# Patient Record
Sex: Female | Born: 1950
Health system: Southern US, Community
[De-identification: ages and names within clinical notes are randomized; demographics above are authoritative.]

## PROBLEM LIST (undated history)

## (undated) DIAGNOSIS — Z720 Tobacco use: Secondary | ICD-10-CM

## (undated) DIAGNOSIS — A159 Respiratory tuberculosis unspecified: Secondary | ICD-10-CM

## (undated) DIAGNOSIS — H269 Unspecified cataract: Secondary | ICD-10-CM

## (undated) DIAGNOSIS — E0590 Thyrotoxicosis, unspecified without thyrotoxic crisis or storm: Secondary | ICD-10-CM

## (undated) DIAGNOSIS — H3320 Serous retinal detachment, unspecified eye: Secondary | ICD-10-CM

## (undated) DIAGNOSIS — R002 Palpitations: Secondary | ICD-10-CM

## (undated) DIAGNOSIS — I10 Essential (primary) hypertension: Secondary | ICD-10-CM

## (undated) DIAGNOSIS — C539 Malignant neoplasm of cervix uteri, unspecified: Secondary | ICD-10-CM

## (undated) DIAGNOSIS — T7840XA Allergy, unspecified, initial encounter: Secondary | ICD-10-CM

## (undated) DIAGNOSIS — H409 Unspecified glaucoma: Secondary | ICD-10-CM

## (undated) DIAGNOSIS — Z9289 Personal history of other medical treatment: Secondary | ICD-10-CM

## (undated) DIAGNOSIS — M1990 Unspecified osteoarthritis, unspecified site: Secondary | ICD-10-CM

## (undated) DIAGNOSIS — R112 Nausea with vomiting, unspecified: Secondary | ICD-10-CM

## (undated) DIAGNOSIS — R32 Unspecified urinary incontinence: Secondary | ICD-10-CM

## (undated) DIAGNOSIS — K219 Gastro-esophageal reflux disease without esophagitis: Secondary | ICD-10-CM

## (undated) DIAGNOSIS — Z9889 Other specified postprocedural states: Secondary | ICD-10-CM

## (undated) HISTORY — DX: Thyrotoxicosis, unspecified without thyrotoxic crisis or storm: E05.90

## (undated) HISTORY — DX: Malignant neoplasm of cervix uteri, unspecified: C53.9

## (undated) HISTORY — DX: Allergy, unspecified, initial encounter: T78.40XA

## (undated) HISTORY — PX: TOTAL ABDOMINAL HYSTERECTOMY W/ BILATERAL SALPINGOOPHORECTOMY: SHX83

## (undated) HISTORY — DX: Personal history of other medical treatment: Z92.89

## (undated) HISTORY — PX: EYE SURGERY: SHX253

## (undated) HISTORY — PX: OTHER SURGICAL HISTORY: SHX169

## (undated) HISTORY — PX: REPAIR OF COMPLEX TRACTION RETINAL DETACHMENT: SHX6217

## (undated) HISTORY — DX: Unspecified urinary incontinence: R32

## (undated) HISTORY — DX: Tobacco use: Z72.0

## (undated) HISTORY — PX: ABDOMINAL HYSTERECTOMY: SHX81

## (undated) HISTORY — DX: Unspecified glaucoma: H40.9

---

## 1982-04-03 HISTORY — PX: ABDOMINAL HYSTERECTOMY: SHX81

## 1998-03-29 ENCOUNTER — Ambulatory Visit (HOSPITAL_BASED_OUTPATIENT_CLINIC_OR_DEPARTMENT_OTHER): Admission: RE | Admit: 1998-03-29 | Discharge: 1998-03-29 | Payer: Self-pay | Admitting: Ophthalmology

## 1998-10-06 ENCOUNTER — Encounter: Payer: Self-pay | Admitting: Ophthalmology

## 1998-10-06 ENCOUNTER — Ambulatory Visit (HOSPITAL_COMMUNITY): Admission: RE | Admit: 1998-10-06 | Discharge: 1998-10-07 | Payer: Self-pay | Admitting: Ophthalmology

## 1998-11-26 ENCOUNTER — Other Ambulatory Visit: Admission: RE | Admit: 1998-11-26 | Discharge: 1998-11-26 | Payer: Self-pay | Admitting: *Deleted

## 2000-09-28 ENCOUNTER — Other Ambulatory Visit: Admission: RE | Admit: 2000-09-28 | Discharge: 2000-09-28 | Payer: Self-pay | Admitting: Family Medicine

## 2001-10-07 ENCOUNTER — Other Ambulatory Visit: Admission: RE | Admit: 2001-10-07 | Discharge: 2001-10-07 | Payer: Self-pay | Admitting: Family Medicine

## 2004-01-27 ENCOUNTER — Other Ambulatory Visit: Admission: RE | Admit: 2004-01-27 | Discharge: 2004-01-27 | Payer: Self-pay | Admitting: Family Medicine

## 2004-02-19 ENCOUNTER — Other Ambulatory Visit: Admission: RE | Admit: 2004-02-19 | Discharge: 2004-02-19 | Payer: Self-pay | Admitting: Diagnostic Radiology

## 2005-08-25 ENCOUNTER — Encounter: Admission: RE | Admit: 2005-08-25 | Discharge: 2005-08-25 | Payer: Self-pay | Admitting: Family Medicine

## 2006-08-03 ENCOUNTER — Emergency Department (HOSPITAL_COMMUNITY): Admission: EM | Admit: 2006-08-03 | Discharge: 2006-08-03 | Payer: Self-pay | Admitting: *Deleted

## 2009-07-14 ENCOUNTER — Emergency Department (HOSPITAL_COMMUNITY): Admission: EM | Admit: 2009-07-14 | Discharge: 2009-07-14 | Payer: Self-pay | Admitting: Emergency Medicine

## 2010-04-23 ENCOUNTER — Encounter: Payer: Self-pay | Admitting: Family Medicine

## 2013-03-08 ENCOUNTER — Emergency Department (HOSPITAL_BASED_OUTPATIENT_CLINIC_OR_DEPARTMENT_OTHER): Payer: Self-pay

## 2013-03-08 ENCOUNTER — Encounter (HOSPITAL_BASED_OUTPATIENT_CLINIC_OR_DEPARTMENT_OTHER): Payer: Self-pay | Admitting: Emergency Medicine

## 2013-03-08 ENCOUNTER — Emergency Department (HOSPITAL_BASED_OUTPATIENT_CLINIC_OR_DEPARTMENT_OTHER)
Admission: EM | Admit: 2013-03-08 | Discharge: 2013-03-08 | Disposition: A | Discharge status: Home / Self Care | Payer: Self-pay | Attending: Emergency Medicine | Admitting: Emergency Medicine

## 2013-03-08 DIAGNOSIS — F17200 Nicotine dependence, unspecified, uncomplicated: Secondary | ICD-10-CM | POA: Insufficient documentation

## 2013-03-08 DIAGNOSIS — G459 Transient cerebral ischemic attack, unspecified: Secondary | ICD-10-CM | POA: Insufficient documentation

## 2013-03-08 DIAGNOSIS — I10 Essential (primary) hypertension: Secondary | ICD-10-CM | POA: Insufficient documentation

## 2013-03-08 DIAGNOSIS — Z8669 Personal history of other diseases of the nervous system and sense organs: Secondary | ICD-10-CM | POA: Insufficient documentation

## 2013-03-08 HISTORY — DX: Essential (primary) hypertension: I10

## 2013-03-08 HISTORY — DX: Serous retinal detachment, unspecified eye: H33.20

## 2013-03-08 LAB — BASIC METABOLIC PANEL
BUN: 23 mg/dL (ref 6–23)
CO2: 26 mEq/L (ref 19–32)
Chloride: 102 mEq/L (ref 96–112)
GFR calc non Af Amer: 42 mL/min — ABNORMAL LOW (ref >90)
Glucose, Bld: 104 mg/dL — ABNORMAL HIGH (ref 70–99)
Sodium: 139 mEq/L (ref 135–145)

## 2013-03-08 LAB — TROPONIN I: Troponin I: <0.3 ng/mL (ref <0.30)

## 2013-03-08 MED ORDER — ASPIRIN 81 MG PO CHEW
324.0000 mg | CHEWABLE_TABLET | Freq: Once | ORAL | Status: AC
  Administered 2013-03-08: 324 mg via ORAL
  Filled 2013-03-08: qty 4

## 2013-03-08 MED ORDER — METOPROLOL TARTRATE 100 MG PO TABS: 50.0000 mg | ORAL_TABLET | Freq: Two times a day (BID) | ORAL | Status: DC

## 2013-03-08 MED ORDER — LISINOPRIL 10 MG PO TABS
20.0000 mg | ORAL_TABLET | Freq: Once | ORAL | Status: AC
  Administered 2013-03-08: 20 mg via ORAL
  Filled 2013-03-08: qty 2

## 2013-03-08 MED ORDER — LISINOPRIL-HYDROCHLOROTHIAZIDE 20-12.5 MG PO TABS: 1.0000 | ORAL_TABLET | Freq: Every day | ORAL | Status: DC

## 2013-03-08 NOTE — ED Notes (Signed)
Pt having hypertension due to running out of medication.  Pt has not been on routine meds for over two months.  Pt stated her left hand felt funny last night, radiating to jaw.  Some speech slurring last night according to daughter.  Symptoms resolved today.

## 2013-03-08 NOTE — ED Provider Notes (Signed)
CSN: [id number]     Arrival date & time 03/08/13  1446 History   First MD Initiated Contact with Patient 03/08/13 1516     Chief Complaint  Patient presents with  . Hypertension    HPI  Patient presents here with her daughter. 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 While she was sitting with the patient last night she had an episode of left arm tingling. She may have some slurring of her speech. She is uncertain about this prior symptoms resolved within an hour. She drove home. She slapped. She waking today asymptomatic. She told her daughter about it. Her daughter presents her here. Patient has a history of hypertension and continues to smoke. Has been off her blood pressure medicine for several months. Never had stroke symptoms. Never had cardiac symptoms. No chest pain or palpitations with the episode yesterday no symptoms today. No difficulty with speech, cognition, numbness weakness. Or other symptoms. Past Medical History  Diagnosis Date  . Hypertension   . Retinal detachment    Past Surgical History  Procedure Laterality Date  . Repair of complex traction retinal detachment     History reviewed. No pertinent family history. History  Substance Use Topics  . Smoking status: Current Every Day Smoker  . Smokeless tobacco: Not on file  . Alcohol Use: Not on file   OB History   Grav Para Term Preterm Abortions TAB SAB Ect Mult Living                 Review of Systems  Constitutional: Negative for fever, chills, diaphoresis, appetite change and fatigue.  HENT: Negative for mouth sores, sore throat and trouble swallowing.   Eyes: Negative for visual disturbance.  Respiratory: Negative for cough, chest tightness, shortness of breath and wheezing.   Cardiovascular: Negative for chest pain and palpitations.  Gastrointestinal: Negative for nausea, vomiting, abdominal pain, diarrhea and abdominal distention.  Endocrine: Negative for polydipsia, polyphagia and polyuria.   Genitourinary: Negative for dysuria, frequency and hematuria.  Musculoskeletal: Negative for gait problem.  Skin: Negative for color change, pallor and rash.  Neurological: Positive for speech difficulty. Negative for dizziness, syncope, weakness, light-headedness and headaches.       Angling to the left arm without weakness or pain  Hematological: Does not bruise/bleed easily.  Psychiatric/Behavioral: Negative for behavioral problems and confusion.    Allergies  Codeine  Home Medications    BP 183/96  Pulse 78  Temp(Src) 97.6 F (36.4 C) (Oral)  Resp 16  Ht 5\' 7"  (1.702 m)  Wt 191 lb 1 oz (86.665 kg)  BMI 29.92 kg/m2  SpO2 97% Physical Exam  Constitutional: She is oriented to person, place, and time. She appears well-developed and well-nourished. No distress.  HENT:  Head: Normocephalic.  Eyes: Conjunctivae are normal. Pupils are equal, round, and reactive to light. No scleral icterus.  Neck: Normal range of motion. Neck supple. No thyromegaly present.  No carotid bruits  Cardiovascular: Normal rate, regular rhythm and normal heart sounds.  Exam reveals no gallop and no friction rub.   No murmur heard. Normal heart tones. Sinus rhythm on the monitor. No ectopy.  Pulmonary/Chest: Effort normal and breath sounds normal. No respiratory distress. She has no wheezes. She has no rales.  Abdominal: Soft. Bowel sounds are normal. She exhibits no distension. There is no tenderness. There is no rebound.  Musculoskeletal: Normal range of motion.  Neurological: She is alert and oriented to person, place, and time.  Speech.  Normal upper and lower extremity exam without weakness or pronator drift. Normal gait. Normal tandem gait. Normal cranial nerves without facial droop.  Skin: Skin is warm and dry. No rash noted.  Psychiatric: She has a normal mood and affect. Her behavior is normal.    ED Course  Procedures (including critical care time) Labs Review Labs Reviewed  BASIC  METABOLIC PANEL - Abnormal; Notable for the following:    Glucose, Bld 104 (*)    Creatinine, Ser 1.30 (*)    GFR calc non Af Amer 42 (*)    GFR calc Af Amer 49 (*)    All other components within normal limits  TROPONIN I   Imaging Review Ct Head Wo Contrast  03/08/2013   CLINICAL DATA:  Headache and dizziness.  Left arm and face numbness.  EXAM: CT HEAD WITHOUT CONTRAST  TECHNIQUE: Contiguous axial images were obtained from the base of the skull through the vertex without intravenous contrast.  COMPARISON:  None.  FINDINGS: There is marked patchy low attenuation throughout the subcortical and periventricular white matter consistent with small vessel ischemic disease.  Prominence of the sulci and ventricles are identified consistent with brain atrophy.  No acute cortical infarct, hemorrhage, or mass lesion ispresent. Ventricles are of normal size. No significant extra-axial fluid collection is present. The paranasal sinuses andmastoid air cells are clear. The osseous skull is intact.  IMPRESSION: Marked chronic small vessel ischemic change. Could extensive small vessel ischemic change a diminishes sensitivity for detecting subtle acute infarct.  No acute intracranial abnormalities noted.   Electronically Signed   By: Signa Kell M.D.   On: 03/08/2013 16:42    EKG Interpretation    Date/Time:  Saturday March 08 2013 15:35:56 EST Ventricular Rate:  66 PR Interval:  170 QRS Duration: 102 QT Interval:  418 QTC Calculation: 438 R Axis:   -3 Text Interpretation:  Normal sinus rhythm Left ventricular hypertrophy Abnormal ECG Confirmed by Fayrene Fearing  MD, Merel Santoli ([id number]) on 03/08/2013 4:47:22 PM            MDM   1. Hypertension   2. TIA (transient ischemic attack)    She is a mild elevation of her creatinine at 1.3. She has chronic ischemic white matter changes on her CT but no acute infarct. She is asymptomatic here. She has LVH on her EKG. Usual symptoms of chronic poorly controlled high  blood pressure and tobacco abuse. I have told her in no uncertain terms to stop smoking. Astra take 81 mg aspirin daily. Refilled her medications. Astra have her creatinine rechecked within a month during that she is on an ACE inhibitor. Discuss inpatient versus outpatient treatment she has an absolute desire to not be transported to inpatient care.    Roney Marion, MD 03/08/13 [phone]

## 2014-02-23 ENCOUNTER — Other Ambulatory Visit: Payer: Self-pay | Admitting: Family Medicine

## 2014-03-19 ENCOUNTER — Other Ambulatory Visit: Payer: Self-pay | Admitting: Endocrinology

## 2014-03-19 DIAGNOSIS — E0590 Thyrotoxicosis, unspecified without thyrotoxic crisis or storm: Secondary | ICD-10-CM

## 2014-03-19 DIAGNOSIS — E0789 Other specified disorders of thyroid: Secondary | ICD-10-CM

## 2014-03-19 DIAGNOSIS — E049 Nontoxic goiter, unspecified: Secondary | ICD-10-CM

## 2014-03-26 ENCOUNTER — Ambulatory Visit
Admission: RE | Admit: 2014-03-26 | Discharge: 2014-03-26 | Disposition: A | Discharge status: Home / Self Care | Payer: Self-pay | Source: Ambulatory Visit | Attending: Endocrinology | Admitting: Endocrinology

## 2014-03-26 DIAGNOSIS — E0590 Thyrotoxicosis, unspecified without thyrotoxic crisis or storm: Secondary | ICD-10-CM

## 2014-03-26 DIAGNOSIS — E0789 Other specified disorders of thyroid: Secondary | ICD-10-CM

## 2014-05-04 ENCOUNTER — Other Ambulatory Visit: Payer: Self-pay | Admitting: Endocrinology

## 2014-05-04 DIAGNOSIS — E0520 Thyrotoxicosis with toxic multinodular goiter without thyrotoxic crisis or storm: Secondary | ICD-10-CM

## 2014-05-18 ENCOUNTER — Encounter (HOSPITAL_COMMUNITY)
Admission: RE | Admit: 2014-05-18 | Discharge: 2014-05-18 | Disposition: A | Discharge status: Home / Self Care | Payer: Medicare HMO | Source: Ambulatory Visit | Attending: Endocrinology | Admitting: Endocrinology

## 2014-05-18 DIAGNOSIS — E0520 Thyrotoxicosis with toxic multinodular goiter without thyrotoxic crisis or storm: Secondary | ICD-10-CM | POA: Insufficient documentation

## 2014-05-19 ENCOUNTER — Encounter (HOSPITAL_COMMUNITY)
Admission: RE | Admit: 2014-05-19 | Discharge: 2014-05-19 | Disposition: A | Discharge status: Home / Self Care | Payer: Medicare HMO | Source: Ambulatory Visit | Attending: Endocrinology | Admitting: Endocrinology

## 2014-05-19 DIAGNOSIS — E0520 Thyrotoxicosis with toxic multinodular goiter without thyrotoxic crisis or storm: Secondary | ICD-10-CM | POA: Diagnosis present

## 2014-05-19 MED ORDER — SODIUM IODIDE I 131 CAPSULE
16.0000 | Freq: Once | INTRAVENOUS | Status: AC | PRN
Start: 2014-05-18 — End: 2014-05-18

## 2014-05-19 MED ORDER — SODIUM PERTECHNETATE TC 99M INJECTION
10.0000 | Freq: Once | INTRAVENOUS | Status: AC | PRN
  Administered 2014-05-19: 10 via INTRAVENOUS

## 2014-06-08 ENCOUNTER — Other Ambulatory Visit: Payer: Self-pay | Admitting: Endocrinology

## 2014-06-08 DIAGNOSIS — E041 Nontoxic single thyroid nodule: Secondary | ICD-10-CM

## 2014-06-09 ENCOUNTER — Other Ambulatory Visit: Payer: Self-pay | Admitting: Endocrinology

## 2014-06-09 DIAGNOSIS — E041 Nontoxic single thyroid nodule: Secondary | ICD-10-CM

## 2014-06-16 ENCOUNTER — Ambulatory Visit
Admission: RE | Admit: 2014-06-16 | Discharge: 2014-06-16 | Disposition: A | Discharge status: Home / Self Care | Payer: Medicare HMO | Source: Ambulatory Visit | Attending: Endocrinology | Admitting: Endocrinology

## 2014-06-16 ENCOUNTER — Other Ambulatory Visit (HOSPITAL_COMMUNITY)
Admission: RE | Admit: 2014-06-16 | Discharge: 2014-06-16 | Disposition: A | Discharge status: Home / Self Care | Payer: Medicare HMO | Source: Ambulatory Visit | Attending: Interventional Radiology | Admitting: Interventional Radiology

## 2014-06-16 DIAGNOSIS — E041 Nontoxic single thyroid nodule: Secondary | ICD-10-CM | POA: Diagnosis present

## 2014-11-26 ENCOUNTER — Telehealth: Payer: Self-pay | Admitting: *Deleted

## 2014-11-26 NOTE — Telephone Encounter (Signed)
Needs to speak a triage nurse

## 2014-11-26 NOTE — Telephone Encounter (Signed)
Dr. Kristie Cowman from [facility] contacted [facility] to discuss patient that he has in his care.  Patient who has history of PVC's, Hyperthyroidism and non compliance with medications and plan of care presents with EKG that shows SR with bigeminal PVC's without symptoms.  Dr. Ronnald Ramp would like to know if the patient needs cardiology consult.  Discussed with Dr. Martinique  Made appointment with next available, Tuesday, 8/30 with Richardson Dopp at [facility]  Dr. Ronnald Ramp given the information for follow up and will give the information to the patient  Will be faxing all notes, EKG to [phone]

## 2014-11-30 NOTE — Progress Notes (Addendum)
Cardiology Office Note   Date:  12/01/2014   ID:  Kari Coleman, Kari Coleman 1950-08-28, MRN [id number]  PCP:  Dorena Dew, FNP  Cardiologist:  New - Dr. Casandra Doffing   Electrophysiologist:  n/a  Chief Complaint  Patient presents with  . Irregular Heart Beat    PVCs     History of Present Illness: Kari Coleman is a 64 y.o. female with a hx of persistent hyperthyroidism despite RAI treatment (followed by Dr. Elyse Hsu), HTN x 47 years, tobacco abuse.    Patient recently seen by primary care and noted to have frequent PVCs on ECG. She is referred for further evaluation.  She has been told in the past that she has an irregular heart beat. She does not really feel significant palpitations.  She denies syncope or near syncope.  She denies exertional chest pain or significant dyspnea.  She can walk up 30 steps without stopping. She is NYHA 2.  She sleeps on 1-2 pillows.  She denies PND, LE edema.    Studies/Reports Reviewed Today:  None    Past Medical History  Diagnosis Date  . Hypertension   . Retinal detachment     L eye - partial blindness  . Hyperthyroidism     s/p RAI treatment  . Urinary incontinence   . Tobacco abuse   . Cervical cancer     Past Surgical History  Procedure Laterality Date  . Repair of complex traction retinal detachment    . Total abdominal hysterectomy w/ bilateral salpingoophorectomy       Current Outpatient Prescriptions  Medication Sig Dispense Refill  . amLODipine (NORVASC) 10 MG tablet Take 1 tablet by mouth daily.    . fesoterodine (TOVIAZ) 8 MG TB24 tablet Take 8 mg by mouth daily.    Marland Kitchen latanoprost (XALATAN) 0.005 % ophthalmic solution Place into both eyes daily. As directed    . lisinopril-hydrochlorothiazide (PRINZIDE,ZESTORETIC) 20-25 MG per tablet Take 1 tablet by mouth daily.    . methimazole (TAPAZOLE) 5 MG tablet Take 1 tablet by mouth daily.    . metoprolol succinate (TOPROL-XL) 50 MG 24 hr tablet Take 1 tablet by mouth daily.     . mirabegron ER (MYRBETRIQ) 50 MG TB24 tablet Take 50 mg by mouth daily.     No current facility-administered medications for this visit.    Allergies:   Codeine    Social History:  The patient  reports that she has been smoking Cigarettes.  She started smoking about 46 years ago. She has been smoking about 0.50 packs per day. She has never used smokeless tobacco. She reports that she does not drink alcohol or use illicit drugs.   Family History:  The patient's family history includes Cancer in her sister; Colon cancer in her maternal aunt; Heart failure in her mother; Hypertension in her mother; Pancreatic cancer in her father; Thyroid disease in her paternal aunt and another family member.    ROS:   Please see the history of present illness.   Review of Systems  All other systems reviewed and are negative.     PHYSICAL EXAM: VS:  BP 130/60 mmHg  Pulse 80  Ht 5\' 7"  (1.702 m)  Wt 202 lb (91.627 kg)  BMI 31.63 kg/m2    Wt Readings from Last 3 Encounters:  12/01/14 202 lb (91.627 kg)  03/08/13 191 lb 1 oz (86.665 kg)     GEN: Well nourished, well developed, in no acute distress HEENT: normal Neck: no JVD,  no carotid bruits, no masses Cardiac:  Normal S1/S2, RRR; no murmur ,  no rubs or gallops, no edema   Respiratory:  clear to auscultation bilaterally, no wheezing, rhonchi or rales. GI: soft, nontender, nondistended, + BS MS: no deformity or atrophy Skin: warm and dry  Neuro:  CNs II-XII intact, Strength and sensation are intact Psych: Normal affect   EKG:  EKG is ordered today.  It demonstrates:   NSR with ventricular bigeminy, ventricular rate 80 EKG from 09/2013: NSR, HR 71 ventricular bigeminy  Recent Labs: No results found for requested labs within last 365 days.    Lipid Panel No results found for: CHOL, TRIG, HDL, CHOLHDL, VLDL, LDLCALC, LDLDIRECT    ASSESSMENT AND PLAN:  PVC (premature ventricular contraction):  She has ventricular bigeminy on ECG  that is asymptomatic.  She is already on beta-blocker therapy.  She has a hx of hyperthyroidism that is followed by Dr. Elyse Hsu.  It sounds as though she has persistent hyperthyroidism despite RAI treatment. She should follow up with her endocrinologist for continued management of her thyroid. I will contact her PCP's office to see if a e-lytes have been checked.  If not, I will get a BMET, Mg2+ today.  Obtain Echo to rule out structural heart disease.  Obtain 24 hr Holter to assess PVC burden.  Plan FU as needed or sooner if testing is abnormal.   Hyperthyroidism:  FU with endocrinology.  Tobacco Abuse:  She is trying to quit.       Medication Changes: Current medicines are reviewed at length with the patient today.  Concerns regarding medicines are as outlined above.  The following changes have been made:   Discontinued Medications   LISINOPRIL-HYDROCHLOROTHIAZIDE (ZESTORETIC) 20-12.5 MG PER TABLET    Take 1 tablet by mouth daily.   METOPROLOL (LOPRESSOR) 100 MG TABLET    Take 0.5 tablets (50 mg total) by mouth 2 (two) times daily.   UNABLE TO FIND    Med Name:    Modified Medications   No medications on file   New Prescriptions   No medications on file    Labs/ tests ordered today include:   Orders Placed This Encounter  Procedures  . Basic Metabolic Panel (BMET)  . Magnesium  . Holter monitor - 24 hour  . EKG 12-Lead  . Echocardiogram      Disposition:    FU with Dr. Casandra Doffing prn or sooner if testing is abnormal.    Signed, Richardson Dopp, PA-C, MHS 12/01/2014 3:16 PM    [facility] [address], [place], [place]  [address] Phone: [phone]; Fax: [phone]    I have examined the patient and reviewed assessment and plan and discussed with patient.  Agree with above as stated.  Need to assess PVC burden.  Plan for Holter monitor.   VARANASI,JAYADEEP S.

## 2014-12-01 ENCOUNTER — Telehealth: Payer: Self-pay | Admitting: *Deleted

## 2014-12-01 ENCOUNTER — Encounter: Payer: Self-pay | Admitting: Physician Assistant

## 2014-12-01 ENCOUNTER — Ambulatory Visit (INDEPENDENT_AMBULATORY_CARE_PROVIDER_SITE_OTHER): Payer: Medicare HMO | Admitting: Physician Assistant

## 2014-12-01 VITALS — BP 130/60 | HR 80 | Ht 67.0 in | Wt 202.0 lb

## 2014-12-01 DIAGNOSIS — I499 Cardiac arrhythmia, unspecified: Secondary | ICD-10-CM

## 2014-12-01 DIAGNOSIS — I493 Ventricular premature depolarization: Secondary | ICD-10-CM

## 2014-12-01 DIAGNOSIS — E0590 Thyrotoxicosis, unspecified without thyrotoxic crisis or storm: Secondary | ICD-10-CM | POA: Diagnosis not present

## 2014-12-01 DIAGNOSIS — I10 Essential (primary) hypertension: Secondary | ICD-10-CM | POA: Insufficient documentation

## 2014-12-01 DIAGNOSIS — I498 Other specified cardiac arrhythmias: Secondary | ICD-10-CM

## 2014-12-01 LAB — BASIC METABOLIC PANEL
BUN: 32 mg/dL — ABNORMAL HIGH (ref 6–23)
CO2: 30 mEq/L (ref 19–32)
CREATININE: 1.2 mg/dL (ref 0.40–1.20)
Calcium: 10.2 mg/dL (ref 8.4–10.5)
Chloride: 103 mEq/L (ref 96–112)
GFR: 57.77 mL/min — ABNORMAL LOW (ref >60.00)
Glucose, Bld: 110 mg/dL — ABNORMAL HIGH (ref 70–99)
POTASSIUM: 3.8 meq/L (ref 3.5–5.1)
Sodium: 138 mEq/L (ref 135–145)

## 2014-12-01 LAB — MAGNESIUM: MAGNESIUM: 2.2 mg/dL (ref 1.5–2.5)

## 2014-12-01 NOTE — Telephone Encounter (Signed)
Pt notified of lab results and that I will fax results to PCP for further eval about BUN if maybe needing to decrease diuretic. Pt said ok and thank you.

## 2014-12-01 NOTE — Patient Instructions (Signed)
Medication Instructions:  Your physician recommends that you continue on your current medications as directed. Please refer to the Current Medication list given to you today.   Labwork: TODAY BMET, MAGNESIUM LEVEL  Testing/Procedures: 1. Your physician has requested that you have an echocardiogram. Echocardiography is a painless test that uses sound waves to create images of your heart. It provides your doctor with information about the size and shape of your heart and how well your heart's chambers and valves are working. This procedure takes approximately one hour. There are no restrictions for this procedure.  2. Your physician has recommended that you wear a 24 HOUR holter monitor. Holter monitors are medical devices that record the heart's electrical activity. Doctors most often use these monitors to diagnose arrhythmias. Arrhythmias are problems with the speed or rhythm of the heartbeat. The monitor is a small, portable device. You can wear one while you do your normal daily activities. This is usually used to diagnose what is causing palpitations/syncope (passing out).   Follow-Up: DR. VARANASI AS NEEDED  Any Other Special Instructions Will Be Listed Below (If Applicable).

## 2014-12-09 ENCOUNTER — Other Ambulatory Visit: Payer: Self-pay

## 2014-12-09 ENCOUNTER — Ambulatory Visit (HOSPITAL_COMMUNITY): Payer: Medicare HMO | Attending: Cardiology

## 2014-12-09 ENCOUNTER — Ambulatory Visit (INDEPENDENT_AMBULATORY_CARE_PROVIDER_SITE_OTHER): Payer: Medicare HMO

## 2014-12-09 ENCOUNTER — Telehealth: Payer: Self-pay | Admitting: *Deleted

## 2014-12-09 ENCOUNTER — Encounter: Payer: Self-pay | Admitting: Physician Assistant

## 2014-12-09 DIAGNOSIS — F17200 Nicotine dependence, unspecified, uncomplicated: Secondary | ICD-10-CM | POA: Diagnosis not present

## 2014-12-09 DIAGNOSIS — I499 Cardiac arrhythmia, unspecified: Secondary | ICD-10-CM | POA: Diagnosis not present

## 2014-12-09 DIAGNOSIS — I517 Cardiomegaly: Secondary | ICD-10-CM | POA: Diagnosis not present

## 2014-12-09 DIAGNOSIS — I340 Nonrheumatic mitral (valve) insufficiency: Secondary | ICD-10-CM | POA: Insufficient documentation

## 2014-12-09 DIAGNOSIS — I498 Other specified cardiac arrhythmias: Secondary | ICD-10-CM

## 2014-12-09 DIAGNOSIS — I493 Ventricular premature depolarization: Secondary | ICD-10-CM

## 2014-12-09 DIAGNOSIS — I10 Essential (primary) hypertension: Secondary | ICD-10-CM | POA: Diagnosis not present

## 2014-12-09 NOTE — Telephone Encounter (Signed)
Pt notified of echo results by phone with verbal understanding 

## 2014-12-24 ENCOUNTER — Telehealth: Payer: Self-pay | Admitting: *Deleted

## 2014-12-24 DIAGNOSIS — I493 Ventricular premature depolarization: Secondary | ICD-10-CM

## 2014-12-24 DIAGNOSIS — I10 Essential (primary) hypertension: Secondary | ICD-10-CM

## 2014-12-24 NOTE — Telephone Encounter (Signed)
Pt notified of monitor results by phone today. Pt advised to have repeat echo 3-4 months to reassess EF and compare to recent echo. If EF still normal; then continue current tx plan if EF dropped refer to EP for further eval. Will have [facility] call and schedule echo to be done in 3-4 months.

## 2015-04-02 ENCOUNTER — Other Ambulatory Visit (HOSPITAL_COMMUNITY): Payer: Medicare HMO

## 2015-04-04 DIAGNOSIS — H409 Unspecified glaucoma: Secondary | ICD-10-CM

## 2015-04-04 HISTORY — DX: Unspecified glaucoma: H40.9

## 2015-04-19 ENCOUNTER — Ambulatory Visit (HOSPITAL_COMMUNITY): Payer: Medicare HMO | Attending: Cardiovascular Disease

## 2015-04-19 ENCOUNTER — Encounter: Payer: Self-pay | Admitting: Physician Assistant

## 2015-04-19 ENCOUNTER — Other Ambulatory Visit: Payer: Self-pay

## 2015-04-19 DIAGNOSIS — I340 Nonrheumatic mitral (valve) insufficiency: Secondary | ICD-10-CM | POA: Diagnosis not present

## 2015-04-19 DIAGNOSIS — I10 Essential (primary) hypertension: Secondary | ICD-10-CM | POA: Diagnosis not present

## 2015-04-19 DIAGNOSIS — I493 Ventricular premature depolarization: Secondary | ICD-10-CM

## 2015-04-19 DIAGNOSIS — I517 Cardiomegaly: Secondary | ICD-10-CM | POA: Diagnosis not present

## 2015-04-20 ENCOUNTER — Telehealth: Payer: Self-pay | Admitting: *Deleted

## 2015-04-20 DIAGNOSIS — I493 Ventricular premature depolarization: Secondary | ICD-10-CM

## 2015-04-20 NOTE — Telephone Encounter (Signed)
Pt notified of echo results and findings by phone with verbal understanding. Repeat echo in 1 yr. order placed in system today.

## 2015-04-20 NOTE — Telephone Encounter (Signed)
No answer on either # for pt. I will try to reach pt later to go over results

## 2016-01-04 DIAGNOSIS — M1711 Unilateral primary osteoarthritis, right knee: Secondary | ICD-10-CM | POA: Insufficient documentation

## 2016-04-22 DIAGNOSIS — Z7982 Long term (current) use of aspirin: Secondary | ICD-10-CM | POA: Diagnosis not present

## 2016-04-22 DIAGNOSIS — Z79899 Other long term (current) drug therapy: Secondary | ICD-10-CM | POA: Diagnosis not present

## 2016-04-22 DIAGNOSIS — Z6831 Body mass index (BMI) 31.0-31.9, adult: Secondary | ICD-10-CM | POA: Diagnosis not present

## 2016-04-22 DIAGNOSIS — M1711 Unilateral primary osteoarthritis, right knee: Secondary | ICD-10-CM | POA: Diagnosis not present

## 2016-04-22 DIAGNOSIS — E669 Obesity, unspecified: Secondary | ICD-10-CM | POA: Diagnosis not present

## 2016-04-22 DIAGNOSIS — I10 Essential (primary) hypertension: Secondary | ICD-10-CM | POA: Diagnosis not present

## 2016-04-22 DIAGNOSIS — H409 Unspecified glaucoma: Secondary | ICD-10-CM | POA: Diagnosis not present

## 2016-04-22 DIAGNOSIS — Z0000 Encounter for general adult medical examination without abnormal findings: Secondary | ICD-10-CM | POA: Diagnosis not present

## 2016-04-22 DIAGNOSIS — K08409 Partial loss of teeth, unspecified cause, unspecified class: Secondary | ICD-10-CM | POA: Diagnosis not present

## 2016-04-22 DIAGNOSIS — R69 Illness, unspecified: Secondary | ICD-10-CM | POA: Diagnosis not present

## 2016-04-22 DIAGNOSIS — H259 Unspecified age-related cataract: Secondary | ICD-10-CM | POA: Diagnosis not present

## 2016-04-22 DIAGNOSIS — E0590 Thyrotoxicosis, unspecified without thyrotoxic crisis or storm: Secondary | ICD-10-CM | POA: Diagnosis not present

## 2016-04-22 DIAGNOSIS — K219 Gastro-esophageal reflux disease without esophagitis: Secondary | ICD-10-CM | POA: Diagnosis not present

## 2016-04-27 DIAGNOSIS — E0590 Thyrotoxicosis, unspecified without thyrotoxic crisis or storm: Secondary | ICD-10-CM | POA: Diagnosis not present

## 2016-05-02 DIAGNOSIS — H401131 Primary open-angle glaucoma, bilateral, mild stage: Secondary | ICD-10-CM | POA: Diagnosis not present

## 2016-05-02 DIAGNOSIS — H2513 Age-related nuclear cataract, bilateral: Secondary | ICD-10-CM | POA: Diagnosis not present

## 2016-07-20 DIAGNOSIS — E0590 Thyrotoxicosis, unspecified without thyrotoxic crisis or storm: Secondary | ICD-10-CM | POA: Diagnosis not present

## 2016-07-27 DIAGNOSIS — E0590 Thyrotoxicosis, unspecified without thyrotoxic crisis or storm: Secondary | ICD-10-CM | POA: Diagnosis not present

## 2016-08-03 DIAGNOSIS — M5136 Other intervertebral disc degeneration, lumbar region: Secondary | ICD-10-CM | POA: Diagnosis not present

## 2016-08-03 DIAGNOSIS — M25562 Pain in left knee: Secondary | ICD-10-CM | POA: Diagnosis not present

## 2016-08-03 DIAGNOSIS — S8992XA Unspecified injury of left lower leg, initial encounter: Secondary | ICD-10-CM | POA: Diagnosis not present

## 2016-08-03 DIAGNOSIS — M5432 Sciatica, left side: Secondary | ICD-10-CM | POA: Diagnosis not present

## 2016-08-03 DIAGNOSIS — M47816 Spondylosis without myelopathy or radiculopathy, lumbar region: Secondary | ICD-10-CM | POA: Diagnosis not present

## 2016-08-15 DIAGNOSIS — M7989 Other specified soft tissue disorders: Secondary | ICD-10-CM | POA: Diagnosis not present

## 2016-08-15 DIAGNOSIS — M79662 Pain in left lower leg: Secondary | ICD-10-CM | POA: Diagnosis not present

## 2016-08-15 DIAGNOSIS — M25562 Pain in left knee: Secondary | ICD-10-CM | POA: Diagnosis not present

## 2016-08-15 DIAGNOSIS — M5432 Sciatica, left side: Secondary | ICD-10-CM | POA: Diagnosis not present

## 2016-08-15 DIAGNOSIS — M79605 Pain in left leg: Secondary | ICD-10-CM | POA: Diagnosis not present

## 2016-08-21 DIAGNOSIS — M6281 Muscle weakness (generalized): Secondary | ICD-10-CM | POA: Diagnosis not present

## 2016-08-21 DIAGNOSIS — M5432 Sciatica, left side: Secondary | ICD-10-CM | POA: Diagnosis not present

## 2016-08-21 DIAGNOSIS — M545 Low back pain: Secondary | ICD-10-CM | POA: Diagnosis not present

## 2016-08-21 DIAGNOSIS — M79605 Pain in left leg: Secondary | ICD-10-CM | POA: Diagnosis not present

## 2016-08-21 DIAGNOSIS — M79604 Pain in right leg: Secondary | ICD-10-CM | POA: Diagnosis not present

## 2016-08-21 DIAGNOSIS — G8929 Other chronic pain: Secondary | ICD-10-CM | POA: Diagnosis not present

## 2016-08-22 DIAGNOSIS — M79604 Pain in right leg: Secondary | ICD-10-CM | POA: Diagnosis not present

## 2016-08-22 DIAGNOSIS — M79605 Pain in left leg: Secondary | ICD-10-CM | POA: Diagnosis not present

## 2016-08-22 DIAGNOSIS — G8929 Other chronic pain: Secondary | ICD-10-CM | POA: Diagnosis not present

## 2016-08-22 DIAGNOSIS — M6281 Muscle weakness (generalized): Secondary | ICD-10-CM | POA: Diagnosis not present

## 2016-08-22 DIAGNOSIS — M5432 Sciatica, left side: Secondary | ICD-10-CM | POA: Diagnosis not present

## 2016-08-22 DIAGNOSIS — M545 Low back pain: Secondary | ICD-10-CM | POA: Diagnosis not present

## 2016-09-06 DIAGNOSIS — M79605 Pain in left leg: Secondary | ICD-10-CM | POA: Diagnosis not present

## 2016-09-06 DIAGNOSIS — M79604 Pain in right leg: Secondary | ICD-10-CM | POA: Diagnosis not present

## 2016-09-06 DIAGNOSIS — M5432 Sciatica, left side: Secondary | ICD-10-CM | POA: Diagnosis not present

## 2016-09-06 DIAGNOSIS — M6281 Muscle weakness (generalized): Secondary | ICD-10-CM | POA: Diagnosis not present

## 2016-09-06 DIAGNOSIS — G8929 Other chronic pain: Secondary | ICD-10-CM | POA: Diagnosis not present

## 2016-09-06 DIAGNOSIS — M545 Low back pain: Secondary | ICD-10-CM | POA: Diagnosis not present

## 2016-09-12 DIAGNOSIS — M79604 Pain in right leg: Secondary | ICD-10-CM | POA: Diagnosis not present

## 2016-09-12 DIAGNOSIS — M79605 Pain in left leg: Secondary | ICD-10-CM | POA: Diagnosis not present

## 2016-09-12 DIAGNOSIS — M545 Low back pain: Secondary | ICD-10-CM | POA: Diagnosis not present

## 2016-09-12 DIAGNOSIS — G8929 Other chronic pain: Secondary | ICD-10-CM | POA: Diagnosis not present

## 2016-09-12 DIAGNOSIS — M6281 Muscle weakness (generalized): Secondary | ICD-10-CM | POA: Diagnosis not present

## 2016-09-12 DIAGNOSIS — M5432 Sciatica, left side: Secondary | ICD-10-CM | POA: Diagnosis not present

## 2016-09-19 DIAGNOSIS — M6281 Muscle weakness (generalized): Secondary | ICD-10-CM | POA: Diagnosis not present

## 2016-09-19 DIAGNOSIS — M545 Low back pain: Secondary | ICD-10-CM | POA: Diagnosis not present

## 2016-09-19 DIAGNOSIS — M79605 Pain in left leg: Secondary | ICD-10-CM | POA: Diagnosis not present

## 2016-09-19 DIAGNOSIS — G8929 Other chronic pain: Secondary | ICD-10-CM | POA: Diagnosis not present

## 2016-09-19 DIAGNOSIS — M5432 Sciatica, left side: Secondary | ICD-10-CM | POA: Diagnosis not present

## 2016-09-19 DIAGNOSIS — M79604 Pain in right leg: Secondary | ICD-10-CM | POA: Diagnosis not present

## 2016-09-26 DIAGNOSIS — M79605 Pain in left leg: Secondary | ICD-10-CM | POA: Diagnosis not present

## 2016-09-26 DIAGNOSIS — G8929 Other chronic pain: Secondary | ICD-10-CM | POA: Diagnosis not present

## 2016-09-26 DIAGNOSIS — M6281 Muscle weakness (generalized): Secondary | ICD-10-CM | POA: Diagnosis not present

## 2016-09-26 DIAGNOSIS — M5432 Sciatica, left side: Secondary | ICD-10-CM | POA: Diagnosis not present

## 2016-09-26 DIAGNOSIS — M545 Low back pain: Secondary | ICD-10-CM | POA: Diagnosis not present

## 2016-09-26 DIAGNOSIS — M79604 Pain in right leg: Secondary | ICD-10-CM | POA: Diagnosis not present

## 2016-10-10 DIAGNOSIS — M545 Low back pain: Secondary | ICD-10-CM | POA: Diagnosis not present

## 2016-10-10 DIAGNOSIS — M79604 Pain in right leg: Secondary | ICD-10-CM | POA: Diagnosis not present

## 2016-10-10 DIAGNOSIS — G8929 Other chronic pain: Secondary | ICD-10-CM | POA: Diagnosis not present

## 2016-10-10 DIAGNOSIS — M6281 Muscle weakness (generalized): Secondary | ICD-10-CM | POA: Diagnosis not present

## 2016-10-10 DIAGNOSIS — M79605 Pain in left leg: Secondary | ICD-10-CM | POA: Diagnosis not present

## 2016-10-10 DIAGNOSIS — M5432 Sciatica, left side: Secondary | ICD-10-CM | POA: Diagnosis not present

## 2016-10-24 DIAGNOSIS — M545 Low back pain: Secondary | ICD-10-CM | POA: Diagnosis not present

## 2016-10-24 DIAGNOSIS — M79604 Pain in right leg: Secondary | ICD-10-CM | POA: Diagnosis not present

## 2016-10-24 DIAGNOSIS — M5432 Sciatica, left side: Secondary | ICD-10-CM | POA: Diagnosis not present

## 2016-10-24 DIAGNOSIS — G8929 Other chronic pain: Secondary | ICD-10-CM | POA: Diagnosis not present

## 2016-10-24 DIAGNOSIS — M79605 Pain in left leg: Secondary | ICD-10-CM | POA: Diagnosis not present

## 2016-10-24 DIAGNOSIS — M6281 Muscle weakness (generalized): Secondary | ICD-10-CM | POA: Diagnosis not present

## 2016-11-21 DIAGNOSIS — M79605 Pain in left leg: Secondary | ICD-10-CM | POA: Diagnosis not present

## 2016-11-21 DIAGNOSIS — M79604 Pain in right leg: Secondary | ICD-10-CM | POA: Diagnosis not present

## 2016-11-21 DIAGNOSIS — M6281 Muscle weakness (generalized): Secondary | ICD-10-CM | POA: Diagnosis not present

## 2016-11-21 DIAGNOSIS — M545 Low back pain: Secondary | ICD-10-CM | POA: Diagnosis not present

## 2016-11-21 DIAGNOSIS — G8929 Other chronic pain: Secondary | ICD-10-CM | POA: Diagnosis not present

## 2016-11-21 DIAGNOSIS — M5432 Sciatica, left side: Secondary | ICD-10-CM | POA: Diagnosis not present

## 2017-01-24 DIAGNOSIS — H5010 Unspecified exotropia: Secondary | ICD-10-CM | POA: Diagnosis not present

## 2017-01-24 DIAGNOSIS — H401131 Primary open-angle glaucoma, bilateral, mild stage: Secondary | ICD-10-CM | POA: Diagnosis not present

## 2017-01-24 DIAGNOSIS — H524 Presbyopia: Secondary | ICD-10-CM | POA: Diagnosis not present

## 2017-01-24 DIAGNOSIS — H2513 Age-related nuclear cataract, bilateral: Secondary | ICD-10-CM | POA: Diagnosis not present

## 2017-01-31 ENCOUNTER — Ambulatory Visit (INDEPENDENT_AMBULATORY_CARE_PROVIDER_SITE_OTHER): Payer: Medicare HMO | Admitting: Physician Assistant

## 2017-01-31 ENCOUNTER — Encounter: Payer: Self-pay | Admitting: Physician Assistant

## 2017-01-31 VITALS — BP 160/100 | HR 85 | Temp 97.8°F | Ht 66.25 in | Wt 195.0 lb

## 2017-01-31 DIAGNOSIS — I10 Essential (primary) hypertension: Secondary | ICD-10-CM | POA: Diagnosis not present

## 2017-01-31 MED ORDER — METOPROLOL SUCCINATE ER 50 MG PO TB24: 50.0000 mg | ORAL_TABLET | Freq: Every day | ORAL | 0 refills | Status: DC

## 2017-01-31 MED ORDER — AMLODIPINE BESYLATE 10 MG PO TABS: 10.0000 mg | ORAL_TABLET | Freq: Every day | ORAL | 0 refills | Status: DC

## 2017-01-31 MED ORDER — LISINOPRIL-HYDROCHLOROTHIAZIDE 20-25 MG PO TABS: 1.0000 | ORAL_TABLET | Freq: Every day | ORAL | 0 refills | Status: DC

## 2017-01-31 NOTE — Progress Notes (Signed)
Kari Coleman is a 66 y.o. female here to Establish Care.  I acted as a Education administrator for [employer], PA-C [name], LPN  History of Present Illness:   Chief Complaint  Patient presents with  . Establish Care    [facility]  . Medication Refill    Blood pressure    Acute Concerns: HTN -- Currently taking 10mg  Norvasc, 50mg  metoprolol XL, and lisinopril-HCTZ 20-25 mg however she hsa been off her medications for at least 1 month. At home blood pressure readings are: 160/100. Patient denies chest pain, SOB, blurred vision, dizziness, unusual headaches, lower leg swelling. Patient is not compliant with medication. Denies excessive caffeine intake, stimulant usage, excessive alcohol intake, or increase in salt consumption.  Health Maintenance: Weight -- Weight: 195 lb (88.5 kg)    Depression screen PHQ 2/9 01/31/2017  Decreased Interest 0  Down, Depressed, Hopeless 0  PHQ - 2 Score 0    No flowsheet data found.   Other providers/specialists: Cardiologist -- [facility]   Past Medical History:  Diagnosis Date  . Cervical cancer ([place])   . Glaucoma 2017  . History of echocardiogram    Echo 9/16:  Mild LVH, EF 55-60%, no RWMA, Gr 1 DD, mild MR // Echo 1/17:  Mild LVH, EF 50-55%, Gr 1 DD, mild to mod MR, mild LAE  . Hypertension   . Hyperthyroidism    s/p RAI treatment  . 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 Tobacco abuse   . Urinary incontinence      Social History   Social History  . Marital status: Single    Spouse name: N/A  . Number of children: 2  . Years of education: N/A   Occupational History  . CNA     [place] History Main Topics  . Smoking status: Former Smoker    Packs/day: 0.50    Types: Cigarettes    Start date: 11/30/1968    Quit date: 10/31/2016  . Smokeless tobacco: Never Used  . Alcohol use No     Comment: rarely   . Drug use: No  . Sexual activity: Yes   Other  Topics Concern  . Not on file   Social History Narrative   Caregiver   Daughter and grandson in household    Past Surgical History:  Procedure Laterality Date  . REPAIR OF COMPLEX TRACTION RETINAL DETACHMENT    . TOTAL ABDOMINAL HYSTERECTOMY W/ BILATERAL SALPINGOOPHORECTOMY      Family History  Problem Relation Age of Onset  . Hypertension Mother   . Heart failure Mother   . Pancreatic cancer Father   . Colon cancer Maternal Aunt   . Cancer Sister   . Thyroid disease Paternal Aunt   . Thyroid disease Unknown        paternal side    Allergies  Allergen Reactions  . Codeine Nausea And Vomiting  . Pollen Extract      Current Medications:   Current Outpatient Prescriptions:  .  ADULT ASPIRIN EC LOW STRENGTH PO, Take by mouth., Disp: , Rfl:  .  latanoprost (XALATAN) 0.005 % ophthalmic solution, Place into both eyes daily. As directed, Disp: , Rfl:  .  methimazole (TAPAZOLE) 10 MG tablet, , Disp: , Rfl:  .  amLODipine (NORVASC) 10 MG tablet, Take 1 tablet (10 mg total) by mouth daily., Disp: 90 tablet, Rfl: 0 .  fesoterodine (TOVIAZ) 8 MG  TB24 tablet, Take 8 mg by mouth daily., Disp: , Rfl:  .  lisinopril-hydrochlorothiazide (PRINZIDE,ZESTORETIC) 20-25 MG tablet, Take 1 tablet by mouth daily., Disp: 90 tablet, Rfl: 0 .  metoprolol succinate (TOPROL-XL) 50 MG 24 hr tablet, Take 1 tablet (50 mg total) by mouth daily., Disp: 90 tablet, Rfl: 0   Review of Systems:   ROS  Negative unless otherwise specified per HPI  Vitals:   Vitals:   01/31/17 1448 01/31/17 1517  BP: (!) 160/100 (!) 160/100  Pulse: 85   Temp: 97.8 F (36.6 C)   TempSrc: Oral   SpO2: 95%   Weight: 195 lb (88.5 kg)   Height: 5' 6.25" (1.683 m)      Body mass index is 31.24 kg/m.  Physical Exam:   Physical Exam  Constitutional: She appears well-developed. She is cooperative.  Non-toxic appearance. She does not have a sickly appearance. She does not appear ill. No distress.  Cardiovascular:  Normal rate, regular rhythm, S1 normal, S2 normal, normal heart sounds and normal pulses.   No LE edema  Pulmonary/Chest: Effort normal and breath sounds normal.  Neurological: She is alert. GCS eye subscore is 4. GCS verbal subscore is 5. GCS motor subscore is 6.  Skin: Skin is warm, dry and intact.  Psychiatric: She has a normal mood and affect. Her speech is normal and behavior is normal.  Nursing note and vitals reviewed.   Assessment and Plan:    [name] was seen today for establish care and medication refill.  Diagnoses and all orders for this visit:  Essential hypertension Restart blood pressure medications. Encouraged compliance. Will check BMP today. I asked patient to follow-up at her convenience for a physical exam. I also asked patient to keep an eye on her blood pressure and follow-up sooner if BP remains >140/90, please let me know.  -     Basic metabolic panel  Other orders -     amLODipine (NORVASC) 10 MG tablet; Take 1 tablet (10 mg total) by mouth daily. -     lisinopril-hydrochlorothiazide (PRINZIDE,ZESTORETIC) 20-25 MG tablet; Take 1 tablet by mouth daily. -     metoprolol succinate (TOPROL-XL) 50 MG 24 hr tablet; Take 1 tablet (50 mg total) by mouth daily.    . Reviewed expectations re: course of current medical issues. . Discussed self-management of symptoms. . Outlined signs and symptoms indicating need for more acute intervention. . Patient verbalized understanding and all questions were answered. . See orders for this visit as documented in the electronic medical record. . Patient received an After-Visit Summary.   CMA or LPN served as scribe during this visit. History, Physical, and Plan performed by medical provider. Documentation and orders reviewed and attested to.   Inda Coke, PA-C

## 2017-01-31 NOTE — Patient Instructions (Signed)
We will call you with your lab results.  Please follow-up in 3 months for blood pressure follow-up.  Additionally, please schedule a physical exam.

## 2017-02-01 LAB — BASIC METABOLIC PANEL
BUN: 18 mg/dL (ref 6–23)
CO2: 29 meq/L (ref 19–32)
Calcium: 10.3 mg/dL (ref 8.4–10.5)
Chloride: 101 mEq/L (ref 96–112)
Creatinine, Ser: 0.93 mg/dL (ref 0.40–1.20)
GFR: 77.02 mL/min (ref >60.00)
Glucose, Bld: 97 mg/dL (ref 70–99)
POTASSIUM: 4.1 meq/L (ref 3.5–5.1)
Sodium: 139 mEq/L (ref 135–145)

## 2017-02-05 ENCOUNTER — Ambulatory Visit: Payer: Medicare HMO | Admitting: Physician Assistant

## 2017-05-04 ENCOUNTER — Ambulatory Visit: Payer: Medicare HMO | Admitting: Family Medicine

## 2017-05-25 ENCOUNTER — Ambulatory Visit (INDEPENDENT_AMBULATORY_CARE_PROVIDER_SITE_OTHER): Payer: Medicare HMO | Admitting: Physician Assistant

## 2017-05-25 ENCOUNTER — Encounter: Payer: Self-pay | Admitting: Physician Assistant

## 2017-05-25 VITALS — BP 140/90 | HR 93 | Temp 98.4°F | Ht 66.25 in | Wt 202.0 lb

## 2017-05-25 DIAGNOSIS — R05 Cough: Secondary | ICD-10-CM

## 2017-05-25 DIAGNOSIS — I10 Essential (primary) hypertension: Secondary | ICD-10-CM

## 2017-05-25 DIAGNOSIS — E0590 Thyrotoxicosis, unspecified without thyrotoxic crisis or storm: Secondary | ICD-10-CM | POA: Diagnosis not present

## 2017-05-25 DIAGNOSIS — R059 Cough, unspecified: Secondary | ICD-10-CM

## 2017-05-25 DIAGNOSIS — R7309 Other abnormal glucose: Secondary | ICD-10-CM | POA: Diagnosis not present

## 2017-05-25 MED ORDER — ALBUTEROL SULFATE HFA 108 (90 BASE) MCG/ACT IN AERS: 2.0000 | INHALATION_SPRAY | Freq: Four times a day (QID) | RESPIRATORY_TRACT | 0 refills | Status: DC | PRN

## 2017-05-25 MED ORDER — LISINOPRIL-HYDROCHLOROTHIAZIDE 20-25 MG PO TABS: 1.0000 | ORAL_TABLET | Freq: Every day | ORAL | 0 refills | Status: DC

## 2017-05-25 MED ORDER — METOPROLOL SUCCINATE ER 100 MG PO TB24: 100.0000 mg | ORAL_TABLET | Freq: Every day | ORAL | 0 refills | Status: DC

## 2017-05-25 MED ORDER — DOXYCYCLINE HYCLATE 100 MG PO TABS: 100.0000 mg | ORAL_TABLET | Freq: Two times a day (BID) | ORAL | 0 refills | Status: DC

## 2017-05-25 MED ORDER — AMLODIPINE BESYLATE 10 MG PO TABS: 10.0000 mg | ORAL_TABLET | Freq: Every day | ORAL | 0 refills | Status: DC

## 2017-05-25 NOTE — Progress Notes (Signed)
Kari Coleman is a 67 y.o. female is here to discuss: Hypertension  I acted as a Education administrator for [employer], PA-C [name], LPN  History of Present Illness:   Chief Complaint  Patient presents with  . Hypertension    Hypertension  This is a chronic (Pt here for follow up on blood pressure. Pt has not been checking  regularly only checking once a week and has been systolic 833'A and diastolic 25'K.) problem. The current episode started more than 1 year ago. The problem is unchanged. The problem is uncontrolled. Pertinent negatives include no chest pain, headaches, malaise/fatigue, orthopnea, peripheral edema or shortness of breath. (Some headaches, since stopped smoking voice is horse and past month or more has noticed some wheezing.)   Sees Dr. Chalmers Cater at [facility] in endocrinology, sees for her hyperthyroidism, she hasn't seen them in at least 1 year. Reports that she takes her BP meds 5 days out of the week, on average.  Wt Readings from Last 3 Encounters:  05/25/17 202 lb (91.6 kg)  01/31/17 195 lb (88.5 kg)  12/01/14 202 lb (91.6 kg)   Cough Patient reports recent coughing episodes for a few weeks. She stopped smoking last summer, will have an occasional cigarette every now and again. She feels like she has mucus trapped in her throat. No difficulty swallowing. No fevers, chills, night sweats. Appetite is normal. States that she has been under a bit of stress recently at work. No hx of PNA or asthma. Tried Coricidin.   Health Maintenance Due  Topic Date Due  . Hepatitis C Screening  [birth year]  . TETANUS/TDAP  09/17/1967  . MAMMOGRAM  09/17/1998  . COLONOSCOPY  09/17/1998  . DEXA SCAN  09/16/2013  . PNA vac Low Risk Adult (1 of 2 - PCV13) 09/16/2013  . INFLUENZA VACCINE  11/01/2016    Past Medical History:  Diagnosis Date  . Cervical cancer ([place])   . Glaucoma 2017  . History of echocardiogram    Echo 9/16:  Mild LVH, EF 55-60%, no RWMA, Gr 1 DD, mild MR // Echo 1/17:   Mild LVH, EF 50-55%, Gr 1 DD, mild to mod MR, mild LAE  . Hypertension   . Hyperthyroidism    s/p RAI treatment  . 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 Tobacco abuse   . Urinary incontinence      Social History   Socioeconomic History  . Marital status: Single    Spouse name: Not on file  . Number of children: 2  . Years of education: Not on file  . Highest education level: Not on file  Social Needs  . Financial resource strain: Not on file  . Food insecurity - worry: Not on file  . Food insecurity - inability: Not on file  . Transportation needs - medical: Not on file  . Transportation needs - non-medical: Not on file  Occupational History  . Occupation: CNA    Comment: [employer]  Tobacco Use  . Smoking status: Former Smoker    Packs/day: 0.50    Types: Cigarettes    Start date: 11/30/1968    Last attempt to quit: 10/31/2016    Years since quitting: 0.5  . Smokeless tobacco: Never Used  Substance and Sexual Activity  . Alcohol use: No    Alcohol/week: 0.0 oz    Comment: rarely   . Drug use: No  . Sexual activity: Yes  Other Topics Concern  .  Not on file  Social History Narrative   Caregiver   Daughter and grandson in household    Past Surgical History:  Procedure Laterality Date  . REPAIR OF COMPLEX TRACTION RETINAL DETACHMENT    . TOTAL ABDOMINAL HYSTERECTOMY W/ BILATERAL SALPINGOOPHORECTOMY      Family History  Problem Relation Age of Onset  . Hypertension Mother   . Heart failure Mother   . Diabetes Mother   . Pancreatic cancer Father   . Colon cancer Maternal Aunt   . Cancer Sister   . Thyroid disease Paternal Aunt   . Cancer Paternal Aunt   . Thyroid disease Unknown        paternal side  . Cancer Maternal Grandfather   . Diabetes Paternal Grandmother   . Diabetes Maternal Uncle     PMHx, SurgHx, SocialHx, FamHx, Medications, and Allergies were reviewed in the Visit Navigator and updated as appropriate.   Patient Active  Problem List   Diagnosis Date Noted  . Hypertension   . Hyperthyroidism     Social History   Tobacco Use  . Smoking status: Former Smoker    Packs/day: 0.50    Types: Cigarettes    Start date: 11/30/1968    Last attempt to quit: 10/31/2016    Years since quitting: 0.5  . Smokeless tobacco: Never Used  Substance Use Topics  . Alcohol use: No    Alcohol/week: 0.0 oz    Comment: rarely   . Drug use: No    Current Medications and Allergies:    Current Outpatient Medications:  .  ADULT ASPIRIN EC LOW STRENGTH PO, Take by mouth., Disp: , Rfl:  .  amLODipine (NORVASC) 10 MG tablet, Take 1 tablet (10 mg total) by mouth daily., Disp: 30 tablet, Rfl: 0 .  latanoprost (XALATAN) 0.005 % ophthalmic solution, Place into both eyes daily. As directed, Disp: , Rfl:  .  lisinopril-hydrochlorothiazide (PRINZIDE,ZESTORETIC) 20-25 MG tablet, Take 1 tablet by mouth daily., Disp: 30 tablet, Rfl: 0 .  methimazole (TAPAZOLE) 10 MG tablet, , Disp: , Rfl:  .  albuterol (PROVENTIL HFA;VENTOLIN HFA) 108 (90 Base) MCG/ACT inhaler, Inhale 2 puffs into the lungs every 6 (six) hours as needed for wheezing or shortness of breath., Disp: 1 Inhaler, Rfl: 0 .  doxycycline (VIBRA-TABS) 100 MG tablet, Take 1 tablet (100 mg total) by mouth 2 (two) times daily., Disp: 20 tablet, Rfl: 0 .  metoprolol succinate (TOPROL-XL) 100 MG 24 hr tablet, Take 1 tablet (100 mg total) by mouth daily. Take with or immediately following a meal., Disp: 30 tablet, Rfl: 0   Allergies  Allergen Reactions  . Codeine Nausea And Vomiting  . Pollen Extract     Review of Systems   Review of Systems  Constitutional: Negative for chills, fever, malaise/fatigue and weight loss.  Respiratory: Positive for cough and wheezing. Negative for shortness of breath.   Cardiovascular: Negative for chest pain, orthopnea, claudication and leg swelling.  Gastrointestinal: Negative for heartburn, nausea and vomiting.  Neurological: Negative for  dizziness, tingling and headaches.    Vitals:   Vitals:   05/25/17 1436 05/25/17 1520  BP: (!) 156/90 140/90  Pulse: 93   Temp: 98.4 F (36.9 C)   TempSrc: Oral   SpO2: 96%   Weight: 202 lb (91.6 kg)   Height: 5' 6.25" (1.683 m)      Body mass index is 32.36 kg/m.   Physical Exam:    Physical Exam  Constitutional: She appears well-developed. She is cooperative.  Non-toxic appearance. She does not have a sickly appearance. She does not appear ill. No distress.  Cardiovascular: Normal rate, regular rhythm, S1 normal, S2 normal, normal heart sounds and normal pulses.  No LE edema  Pulmonary/Chest: Effort normal and breath sounds normal.  Neurological: She is alert. GCS eye subscore is 4. GCS verbal subscore is 5. GCS motor subscore is 6.  Skin: Skin is warm, dry and intact.  Psychiatric: She has a normal mood and affect. Her speech is normal and behavior is normal.  Nursing note and vitals reviewed.    Assessment and Plan:    [place] was seen today for hypertension.  Diagnoses and all orders for this visit:  Hypertension, unspecified type Uncontrolled presently. Will increase Toprol-XL to 100 mg. Work on [employer]. Follow-up in 1-2 weeks to assess blood pressure control. Monitor BP and provide log for [place] at next visit.  -     CBC with Differential/Platelet -     Basic metabolic panel  Cough No red flags on exam.  Will initiate Doxycycline and Albuterol inhaler prn per orders. Discussed taking medications as prescribed. Reviewed return precautions including worsening fever, SOB, worsening cough or other concerns. Push fluids and rest. I recommend that patient follow-up if symptoms worsen or persist despite treatment x 7-10 days, sooner if needed. If cough does not improve or hoarse voice persists, needs f/u. -     CBC with Differential/Platelet  Hyperthyroidism I strongly recommended that she follow-up with endocrinology to have her thyroid levels checked.   Other  orders -     amLODipine (NORVASC) 10 MG tablet; Take 1 tablet (10 mg total) by mouth daily. -     lisinopril-hydrochlorothiazide (PRINZIDE,ZESTORETIC) 20-25 MG tablet; Take 1 tablet by mouth daily. -     metoprolol succinate (TOPROL-XL) 100 MG 24 hr tablet; Take 1 tablet (100 mg total) by mouth daily. Take with or immediately following a meal. -     albuterol (PROVENTIL HFA;VENTOLIN HFA) 108 (90 Base) MCG/ACT inhaler; Inhale 2 puffs into the lungs every 6 (six) hours as needed for wheezing or shortness of breath. -     doxycycline (VIBRA-TABS) 100 MG tablet; Take 1 tablet (100 mg total) by mouth 2 (two) times daily.    . Reviewed expectations re: course of current medical issues. . Discussed self-management of symptoms. . Outlined signs and symptoms indicating need for more acute intervention. . Patient verbalized understanding and all questions were answered. . See orders for this visit as documented in the electronic medical record. . Patient received an After Visit Summary.  CMA or LPN served as scribe during this visit. History, Physical, and Plan performed by medical provider. Documentation and orders reviewed and attested to.  Inda Coke, PA-C [place], [place] 05/25/2017  Follow-up: No Follow-up on file.

## 2017-05-25 NOTE — Patient Instructions (Addendum)
It was great to see you!  Please continue the Norvasc 10 mg and Lisinopril-HCTZ 20-25 mg.  We are going to increase your Toprol-XL to 100 mg daily.  Please follow-up with me in 1-2 weeks to re-check your blood pressure.  Please make a follow-up appointment with your endocrinology doctor.  For your cough: push fluids, start oral antibiotic (doxycycline) and use albuterol inhaler as needed.  If symptoms worsen or persist despite treatment, please let us know.

## 2017-05-28 ENCOUNTER — Other Ambulatory Visit: Payer: Self-pay | Admitting: Physician Assistant

## 2017-05-28 DIAGNOSIS — R7309 Other abnormal glucose: Secondary | ICD-10-CM

## 2017-05-29 LAB — BASIC METABOLIC PANEL
BUN: 19 mg/dL (ref 7–25)
CO2: 25 mmol/L (ref 20–32)
Calcium: 9.9 mg/dL (ref 8.6–10.4)
Chloride: 104 mmol/L (ref 98–110)
Creat: 0.96 mg/dL (ref 0.50–0.99)
GLUCOSE: 128 mg/dL — AB (ref 65–99)
Potassium: 4.1 mmol/L (ref 3.5–5.3)
SODIUM: 141 mmol/L (ref 135–146)

## 2017-05-29 LAB — CBC WITH DIFFERENTIAL/PLATELET
BASOS PCT: 0.9 %
Basophils Absolute: 68 cells/uL (ref 0–200)
EOS ABS: 251 {cells}/uL (ref 15–500)
Eosinophils Relative: 3.3 %
HCT: 40.8 % (ref 35.0–45.0)
HEMOGLOBIN: 14 g/dL (ref 11.7–15.5)
Lymphs Abs: 3405 cells/uL (ref 850–3900)
MCH: 29.4 pg (ref 27.0–33.0)
MCHC: 34.3 g/dL (ref 32.0–36.0)
MCV: 85.7 fL (ref 80.0–100.0)
MPV: 11.1 fL (ref 7.5–12.5)
Monocytes Relative: 3.8 %
NEUTROS ABS: 3587 {cells}/uL (ref 1500–7800)
Neutrophils Relative %: 47.2 %
Platelets: 338 10*3/uL (ref 140–400)
RBC: 4.76 10*6/uL (ref 3.80–5.10)
RDW: 11.9 % (ref 11.0–15.0)
Total Lymphocyte: 44.8 %
WBC: 7.6 10*3/uL (ref 3.8–10.8)
WBCMIX: 289 {cells}/uL (ref 200–950)

## 2017-05-29 LAB — TEST AUTHORIZATION

## 2017-05-29 LAB — HEMOGLOBIN A1C
Hgb A1c MFr Bld: 5.4 % of total Hgb (ref <5.7)
Mean Plasma Glucose: 108 (calc)
eAG (mmol/L): 6 (calc)

## 2017-06-06 ENCOUNTER — Encounter: Payer: Self-pay | Admitting: *Deleted

## 2017-06-06 NOTE — Progress Notes (Addendum)
Subjective:   Kari Coleman is a 67 y.o. female who presents for an Initial Medicare Annual Wellness Visit. [phone])   Reports health as BP moderately high  Lives with dtr Works 5 days a week; 4 hours a day  Cardiac Risk Factors include: advanced age (>46men, >21 women);family history of premature cardiovascular disease;hypertension;obesity (BMI >30kg/m2)    01/31/2017 05/25/2017 Most Recent Value Last 5 Years  Weight 195 lb 202 lb 202 lb 05/25/2017  BMI 32  Diet Lipids are not available A1c 5.4; glucose 128  Eating is comfort at present Does not eat a lot at breakfast Grabs food out Does not cook a lot  Goes out 50% of the time  Takes care of grandson's transportation needs when home; Dtr has MS    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 Hepatitis C Screening  1950/07/12  . TETANUS/TDAP  09/17/1967  . MAMMOGRAM  09/17/1998  . COLONOSCOPY  09/17/1998  . DEXA SCAN  09/16/2013  . PNA vac Low Risk Adult (1 of 2 - PCV13) 09/16/2013  . INFLUENZA VACCINE  11/01/2016   Educated regarding shingrix    Did not review Hep C due to her not feeling well  [facility]  2005  dexa 2005;  Hysterectomy in 1984 Blaire Palomino to schedule DExa  With the breast center Colonoscopy states she had one; 2005  Will consider where she wants to be referred to or defer for now Thinks she was with [facility] at this time- Dr. Dema Severin Address: [address], [place], [place] [address]  Phone: [phone]  IMM pneumonia vaccinations reviewed  Reviewed flu vaccine recommendations   Reviewed Care Everywhere and do not see any updates to preventive screens  Former smokers; smoked 48 years; approx 1/2 pack  24 pack years and 'just stopped" in July 2018  States she was never a "heavy smoker"     Objective:    Today's Vitals   06/07/17 0903  BP: (!) 172/100  Weight: 204 lb 8 oz (92.8  kg)  Height: 5\' 7"  (1.702 m)   Body mass index is 32.03 kg/m.  No flowsheet data found.  Current Medications (verified) Outpatient Encounter Medications as of 06/07/2017  Medication Sig  . ADULT ASPIRIN EC LOW STRENGTH PO Take by mouth.  Marland Kitchen albuterol (PROVENTIL HFA;VENTOLIN HFA) 108 (90 Base) MCG/ACT inhaler Inhale 2 puffs into the lungs every 6 (six) hours as needed for wheezing or shortness of breath.  Marland Kitchen amLODipine (NORVASC) 10 MG tablet Take 1 tablet (10 mg total) by mouth daily.  Marland Kitchen doxycycline (VIBRA-TABS) 100 MG tablet Take 1 tablet (100 mg total) by mouth 2 (two) times daily.  Marland Kitchen latanoprost (XALATAN) 0.005 % ophthalmic solution Place into both eyes daily. As directed  . lisinopril-hydrochlorothiazide (PRINZIDE,ZESTORETIC) 20-25 MG tablet Take 1 tablet by mouth daily.  . methimazole (TAPAZOLE) 10 MG tablet   . metoprolol succinate (TOPROL-XL) 100 MG 24 hr tablet Take 1 tablet (100 mg total) by mouth daily. Take with or immediately following a meal.   No facility-administered encounter medications on file as of 06/07/2017.     Allergies (verified) Codeine and Pollen extract   History: Past Medical History:  Diagnosis Date  . Cervical cancer ([place])   . Glaucoma 2017  . History of echocardiogram    Echo 9/16:  Mild LVH, EF 55-60%, no RWMA,  Gr 1 DD, mild MR // Echo 1/17:  Mild LVH, EF 50-55%, Gr 1 DD, mild to mod MR, mild LAE  . Hypertension   . Hyperthyroidism    s/p RAI treatment  . 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 Tobacco abuse   . Urinary incontinence    Past Surgical History:  Procedure Laterality Date  . REPAIR OF COMPLEX TRACTION RETINAL DETACHMENT    . TOTAL ABDOMINAL HYSTERECTOMY W/ BILATERAL SALPINGOOPHORECTOMY     Family History  Problem Relation Age of Onset  . Hypertension Mother   . Heart failure Mother   . Diabetes Mother   . Pancreatic cancer Father   . Colon cancer Maternal Aunt   . Cancer Sister   . Thyroid disease Paternal Aunt     . Cancer Paternal Aunt   . Thyroid disease Unknown        paternal side  . Cancer Maternal Grandfather   . Diabetes Paternal Grandmother   . Diabetes Maternal Uncle    Social History   Socioeconomic History  . Marital status: Single    Spouse name: Not on file  . Number of children: 2  . Years of education: Not on file  . Highest education level: Not on file  Social Needs  . Financial resource strain: Not on file  . Food insecurity - worry: Not on file  . Food insecurity - inability: Not on file  . Transportation needs - medical: Not on file  . Transportation needs - non-medical: Not on file  Occupational History  . Occupation: CNA    Comment: [employer]  Tobacco Use  . Smoking status: Former Smoker    Packs/day: 0.50    Years: 48.00    Pack years: 24.00    Types: Cigarettes    Start date: 11/30/1968    Last attempt to quit: 10/31/2016    Years since quitting: 0.6  . Smokeless tobacco: Never Used  . Tobacco comment: did  not smoke more than .5 pack   Substance and Sexual Activity  . Alcohol use: No    Alcohol/week: 0.0 oz    Comment: rarely   . Drug use: No  . Sexual activity: Yes  Other Topics Concern  . Not on file  Social History Narrative   Caregiver   Daughter and grandson in household    Tobacco Counseling Counseling given: Yes Comment: did  not smoke more than .5 pack    Clinical Intake:    Activities of Daily Living In your present state of health, do you have any difficulty performing the following activities: 06/07/2017  Hearing? N  Vision? Y  Comment as noted in left eye  Difficulty concentrating or making decisions? N  Walking or climbing stairs? N  Dressing or bathing? N  Doing errands, shopping? N  Preparing Food and eating ? N  Using the Toilet? N  In the past six months, have you accidently leaked urine? Y  Comment urgency and wears a depend  Do you have problems with loss of bowel control? N  Managing your Medications? N   Managing your Finances? N  Housekeeping or managing your Housekeeping? N  Some recent data might be hidden     Immunizations and Health Maintenance Immunization History  Administered Date(s) Administered  . PPD Test 02/29/2016   Health Maintenance Due  Topic Date Due  . Hepatitis C Screening  [birth year]  . TETANUS/TDAP  09/17/1967  . MAMMOGRAM  09/17/1998  .  COLONOSCOPY  09/17/1998  . DEXA SCAN  09/16/2013  . PNA vac Low Risk Adult (1 of 2 - PCV13) 09/16/2013  . INFLUENZA VACCINE  11/01/2016    Patient Care Team: Inda Coke, [place] as PCP - General (Physician Assistant)  Indicate any recent Medical Services you may have received from other than [facility] providers in the past year (date may be approximate).     Assessment:   This is a routine wellness examination for Dawnna.  Hearing/Vision screen Hearing Screening Comments: No hearing issues at present Vision Screening Comments: Dr. 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 Weight (lb) < 200 lb (90.7 kg)     Check out  online nutrition programs as GumSearch.nl and http://vang.com/; fit26me; Look for foods with "whole" wheat; bran; oatmeal etc Shot at the farmer's markets in season for fresher choices  Watch for "hydrogenated" on the label of oils which are trans-fats.  Watch for "high fructose corn syrup" in snacks, yogurt or ketchup  Meats have less marbling; bright colored fruits and vegetables;  Canned; dump out liquid and wash vegetables. Be mindful of what we are eating  Portion control is essential to a health weight! Sit down; take a break and enjoy your  meal; take smaller bites; put the fork down between bites;  It takes 20 minutes to get full; so check in with your fullness cues and stop eating when you start to fill full             Depression Screen PHQ 2/9 Scores 06/07/2017 06/07/2017 01/31/2017  PHQ - 2 Score 0 0 0    Fall Risk Fall Risk  06/07/2017 01/31/2017  Falls in the past year? Yes Yes  Number falls in past yr: 2 or more 1  Injury with Fall? Yes No  Risk for fall due to : Other (Comment) -      Cognitive Function: MMSE - Mini Mental State Exam 06/07/2017  Not completed: (No Data)   Ad8 score reviewed for issues:  Issues making decisions:  Less interest in hobbies / activities:  Repeats questions, stories (family complaining):  Trouble using ordinary gadgets (microwave, computer, phone):  Forgets the month or year:   Mismanaging finances:   Remembering appts:  Daily problems with thinking and/or memory: Ad8 score is=0      Screening Tests Health Maintenance  Topic Date Due  . Hepatitis C Screening  12-03-1950  . TETANUS/TDAP  09/17/1967  . MAMMOGRAM  09/17/1998  . COLONOSCOPY  09/17/1998  . DEXA SCAN  09/16/2013  . PNA vac Low Risk Adult (1 of 2 - PCV13) 09/16/2013  . INFLUENZA VACCINE  11/01/2016        Plan:      PCP Notes   Health Maintenance Educated regarding shingrix   Dexa and Mammogram ordered for the GI to fup  Had colonoscopy per Dr. Dema Severin at [place]. She is not sure if she will fup with them or request referral but request to defer for now.   Educated regarding tetanus and tdap, which she can take anytime, pharmacy more cost effective.  Pneumonia vaccines discussed; states "urgent care clinic" did discuss with her but she is  deferring for now. (last few years; she has not had preventive health due to lack of insurance)   Flu vaccine not taken this year; will declines today   Abnormal Screens  BP 170/100 today States she is nauseated which seemed to increase while she  was here. Felt it may be due to recent prescription for vibramycin. Reinforced the need to get her BP in control. Had to cut the assessment short due to her not feeling well, but told her to call and schedule with me at any time and I would be happy to assist with her goals. [facility] plan; stress reduction; weight loss etc.)   Referrals  No  Patient concerns; As noted  Nurse Concerns; BP but will see Dr. Morene Rankins   Next PCP apt Just noticed she canceled her apt for tomorrow. [name] to fup with her tomorrow am to reschedule and to fup on her BP.       I have personally reviewed and noted the following in the patient's chart:   . Medical and social history . Use of alcohol, tobacco or illicit drugs  . Current medications and supplements . Functional ability and status . Nutritional status . Physical activity . Advanced directives . List of other physicians . Hospitalizations, surgeries, and ER visits in previous 12 months . Vitals . Screenings to include cognitive, depression, and falls . Referrals and appointments  In addition, I have reviewed and discussed with patient certain preventive protocols, quality metrics, and best practice recommendations. A written personalized care plan for preventive services as well as general preventive health recommendations were provided to patient.     UEAVW,UJWJX, RN   06/07/2017     I have personally reviewed the Medicare Annual Wellness questionnaire and have noted 1. The patient's medical and social history 2. Their use of alcohol, tobacco or illicit drugs 3. Their current medications and supplements 4. The patient's functional ability including ADL's, fall risks, home safety risks and hearing or visual impairment. 5. Diet and physical activities 6. Evidence for depression or mood disorders 7. Reviewed Updated provider list, see scanned forms and [facility] Snapshot.   The patients weight, height, BMI and visual acuity have been recorded  in the chart I have made referrals, counseling and provided education to the patient based review of the above and I have provided the pt with a written personalized care plan for preventive services.  I have provided the patient with a copy of your personalized plan for preventive services. Instructed to take the time to review along with their updated medication list.  Inda Coke PA-C 06/08/17

## 2017-06-07 ENCOUNTER — Ambulatory Visit (INDEPENDENT_AMBULATORY_CARE_PROVIDER_SITE_OTHER): Payer: Medicare HMO | Admitting: *Deleted

## 2017-06-07 ENCOUNTER — Encounter: Payer: Self-pay | Admitting: *Deleted

## 2017-06-07 ENCOUNTER — Telehealth: Payer: Self-pay

## 2017-06-07 ENCOUNTER — Telehealth: Payer: Self-pay | Admitting: Physician Assistant

## 2017-06-07 VITALS — BP 172/100 | Ht 67.0 in | Wt 204.5 lb

## 2017-06-07 DIAGNOSIS — E2839 Other primary ovarian failure: Secondary | ICD-10-CM | POA: Diagnosis not present

## 2017-06-07 DIAGNOSIS — Z0000 Encounter for general adult medical examination without abnormal findings: Secondary | ICD-10-CM

## 2017-06-07 DIAGNOSIS — Z1231 Encounter for screening mammogram for malignant neoplasm of breast: Secondary | ICD-10-CM | POA: Diagnosis not present

## 2017-06-07 NOTE — Telephone Encounter (Signed)
Scheduled patient for Monday at 11:30 but she was upset. She says I was her third call today from [place]. She believed she was already scheduled.

## 2017-06-07 NOTE — Telephone Encounter (Unsigned)
Copied from [place] [id number]. Topic: Quick Communication - Appointment Cancellation >> Jun 07, 2017 12:23 PM Marja Kays F wrote: Patient called to cancel appointment scheduled for  06/08/17 at 330 . Patient has not rescheduled their appointment. But she is wanting to schedule on Monday at 1130 if possible  Best number [phone]    Route to department's PEC pool.

## 2017-06-07 NOTE — Telephone Encounter (Signed)
See note

## 2017-06-07 NOTE — Telephone Encounter (Signed)
Call to Ms. Boykin Reaper when she cancelled her apt with Inda Coke NP for tomorrow. States she took her BP at home and it was 140/80.  Re-scheduled to see Inda Coke PA on Monday at 11:30   [place]

## 2017-06-07 NOTE — Telephone Encounter (Signed)
Contact patient to reschedule.

## 2017-06-07 NOTE — Patient Instructions (Addendum)
Kari Coleman , Thank you for taking time to come for your Medicare Wellness Visit. I appreciate your ongoing commitment to your health goals. Please review the following plan we discussed and let me know if I can assist you in the future.  Shingrix is a vaccine for the prevention of Shingles in Adults 50 and older.  If you are on Medicare, you can request a prescription from your doctor to be filled at a pharmacy.  Please check with your benefits regarding applicable copays or out of pocket expenses.  The Shingrix is given in 2 vaccines approx 8 weeks apart. You must receive the 2nd dose prior to 6 months from receipt of the first.     A Tetanus is recommended every 10 years. Medicare covers a tetanus if you have a cut or wound; otherwise, there may be a charge. If you had not had a tetanus with pertusses, known as the Tdap, you can take this anytime.   Please schedule your mammogram; Kari Coleman to order and you should get a call  Kari Coleman to order your bone density at the [facility] as well  Will develop a plan to complete your colonoscopy  You can go back to Dr. Dema Severin and make an apt with her or request referral from [facility] tomorrow    The Centers for Disease Control are now recommending 2 pneumonia vaccinations after 95. The first is the Prevnar 13. This helps to boost your immunity to community acquired pneumonia as well as some protection from bacterial pneumonia  The 2nd is the pneumovax 23, which offers more broad protection!  Please consider taking these as this is your best protection against pneumonia.  Keep in mind the flu shot is an inactivated vaccine and takes at least 2 weeks to build immunity. The flu virus can be dormant for 4 days prior to symptoms Taking the flu shot at the beginning of the season can reduce the risk for the entire community.   Minimal Blood Pressure Goal= AVERAGE < 140/90; Ideal is an AVERAGE < 135/85. This AVERAGE should be calculated from @ least 5-7  BP readings taken @ different times of day on different days of week. You should not respond to isolated BP readings , but rather the AVERAGE for that week .Please bring your blood pressure cuff to office visits to verify that it is reliable.It can also be checked against the blood pressure device at the pharmacy. Finger or wrist cuffs are not dependable; an arm cuff is.  Also, monitor sodium intake in label of any canned food, as well as sodium in common everyday condiments; ketchup; salad dressing, sauces and any fast food restaurant.    These are the goals we discussed: Goals    . Weight (lb) < 200 lb (90.7 kg)     Check out  online nutrition programs as GumSearch.nl and http://vang.com/; fit29m; Look for foods with "whole" wheat; bran; oatmeal etc Shot at the farmer's markets in season for fresher choices  Watch for "hydrogenated" on the label of oils which are trans-fats.  Watch for "high fructose corn syrup" in snacks, yogurt or ketchup  Meats have less marbling; bright colored fruits and vegetables;  Canned; dump out liquid and wash vegetables. Be mindful of what we are eating  Portion control is essential to a health weight! Sit down; take a break and enjoy your meal; take smaller bites; put the fork down between bites;  It takes 20 minutes to get full; so check in  with your fullness cues and stop eating when you start to fill full              This is a list of the screening recommended for you and due dates:  Health Maintenance  Topic Date Due  .  Hepatitis C: One time screening is recommended by Center for Disease Control  (CDC) for  adults born from [birth year] through 1967.   02-Aug-1950  . Tetanus Vaccine  09/17/1967  . Mammogram  09/17/1998  . Colon Cancer Screening  09/17/1998  . DEXA scan (bone density measurement)  09/16/2013  . Pneumonia vaccines (1 of 2 - PCV13) 09/16/2013  . Flu Shot  11/01/2016     Bone Densitometry Bone densitometry is an imaging test  that uses a special X-ray to measure the amount of calcium and other minerals in your bones (bone density). This test is also known as a bone mineral density test or dual-energy X-ray absorptiometry (DXA). The test can measure bone density at your hip and your spine. It is similar to having a regular X-ray. You may have this test to:  Diagnose a condition that causes weak or thin bones (osteoporosis).  Predict your risk of a broken bone (fracture).  Determine how well osteoporosis treatment is working.  Tell a health care provider about:  Any allergies you have.  All medicines you are taking, including vitamins, herbs, eye drops, creams, and over-the-counter medicines.  Any problems you or family members have had with anesthetic medicines.  Any blood disorders you have.  Any surgeries you have had.  Any medical conditions you have.  Possibility of pregnancy.  Any other medical test you had within the previous 14 days that used contrast material. What are the risks? Generally, this is a safe procedure. However, problems can occur and may include the following:  This test exposes you to a very small amount of radiation.  The risks of radiation exposure may be greater to unborn children.  What happens before the procedure?  Do not take any calcium supplements for 24 hours before having the test. You can otherwise eat and drink what you usually do.  Take off all metal jewelry, eyeglasses, dental appliances, and any other metal objects. What happens during the procedure?  You may lie on an exam table. There will be an X-ray generator below you and an imaging device above you.  Other devices, such as boxes or braces, may be used to position your body properly for the scan.  You will need to lie still while the machine slowly scans your body.  The images will show up on a computer monitor. What happens after the procedure? You may need more testing at a later time. This  information is not intended to replace advice given to you by your health care provider. Make sure you discuss any questions you have with your health care provider. Document Released: 04/11/2004 Document Revised: 08/26/2015 Document Reviewed: 08/28/2013 Elsevier Interactive Patient Education  2018 [place] in the Home Falls can cause injuries. They can happen to people of all ages. There are many things you can do to make your home safe and to help prevent falls. What can I do on the outside of my home?  Regularly fix the edges of walkways and driveways and fix any cracks.  Remove anything that might make you trip as you walk through a door, such as a raised step or threshold.  Trim any bushes or trees  on the path to your home.  Use bright outdoor lighting.  Clear any walking paths of anything that might make someone trip, such as rocks or tools.  Regularly check to see if handrails are loose or broken. Make sure that both sides of any steps have handrails.  Any raised decks and porches should have guardrails on the edges.  Have any leaves, snow, or ice cleared regularly.  Use sand or salt on walking paths during winter.  Clean up any spills in your garage right away. This includes oil or grease spills. What can I do in the bathroom?  Use night lights.  Install grab bars by the toilet and in the tub and shower. Do not use towel bars as grab bars.  Use non-skid mats or decals in the tub or shower.  If you need to sit down in the shower, use a plastic, non-slip stool.  Keep the floor dry. Clean up any water that spills on the floor as soon as it happens.  Remove soap buildup in the tub or shower regularly.  Attach bath mats securely with double-sided non-slip rug tape.  Do not have throw rugs and other things on the floor that can make you trip. What can I do in the bedroom?  Use night lights.  Make sure that you have a light by your bed that is  easy to reach.  Do not use any sheets or blankets that are too big for your bed. They should not hang down onto the floor.  Have a firm chair that has side arms. You can use this for support while you get dressed.  Do not have throw rugs and other things on the floor that can make you trip. What can I do in the kitchen?  Clean up any spills right away.  Avoid walking on wet floors.  Keep items that you use a lot in easy-to-reach places.  If you need to reach something above you, use a strong step stool that has a grab bar.  Keep electrical cords out of the way.  Do not use floor polish or wax that makes floors slippery. If you must use wax, use non-skid floor wax.  Do not have throw rugs and other things on the floor that can make you trip. What can I do with my stairs?  Do not leave any items on the stairs.  Make sure that there are handrails on both sides of the stairs and use them. Fix handrails that are broken or loose. Make sure that handrails are as long as the stairways.  Check any carpeting to make sure that it is firmly attached to the stairs. Fix any carpet that is loose or worn.  Avoid having throw rugs at the top or bottom of the stairs. If you do have throw rugs, attach them to the floor with carpet tape.  Make sure that you have a light switch at the top of the stairs and the bottom of the stairs. If you do not have them, ask someone to add them for you. What else can I do to help prevent falls?  Wear shoes that: ? Do not have high heels. ? Have rubber bottoms. ? Are comfortable and fit you well. ? Are closed at the toe. Do not wear sandals.  If you use a stepladder: ? Make sure that it is fully opened. Do not climb a closed stepladder. ? Make sure that both sides of the stepladder are locked into place. ?  Ask someone to hold it for you, if possible.  Clearly mark and make sure that you can see: ? Any grab bars or handrails. ? First and last  steps. ? Where the edge of each step is.  Use tools that help you move around (mobility aids) if they are needed. These include: ? Canes. ? Walkers. ? Scooters. ? Crutches.  Turn on the lights when you go into a dark area. Replace any light bulbs as soon as they burn out.  Set up your furniture so you have a clear path. Avoid moving your furniture around.  If any of your floors are uneven, fix them.  If there are any pets around you, be aware of where they are.  Review your medicines with your doctor. Some medicines can make you feel dizzy. This can increase your chance of falling. Ask your doctor what other things that you can do to help prevent falls. This information is not intended to replace advice given to you by your health care provider. Make sure you discuss any questions you have with your health care provider. Document Released: 01/14/2009 Document Revised: 08/26/2015 Document Reviewed: 04/24/2014 Elsevier Interactive Patient Education  2018 [place] Maintenance, Female Adopting a healthy lifestyle and getting preventive care can go a long way to promote health and wellness. Talk with your health care provider about what schedule of regular examinations is right for you. This is a good chance for you to check in with your provider about disease prevention and staying healthy. In between checkups, there are plenty of things you can do on your own. Experts have done a lot of research about which lifestyle changes and preventive measures are most likely to keep you healthy. Ask your health care provider for more information. Weight and diet Eat a healthy diet  Be sure to include plenty of vegetables, fruits, low-fat dairy products, and lean protein.  Do not eat a lot of foods high in solid fats, added sugars, or salt.  Get regular exercise. This is one of the most important things you can do for your health. ? Most adults should exercise for at least 150  minutes each week. The exercise should increase your heart rate and make you sweat (moderate-intensity exercise). ? Most adults should also do strengthening exercises at least twice a week. This is in addition to the moderate-intensity exercise.  Maintain a healthy weight  Body mass index (BMI) is a measurement that can be used to identify possible weight problems. It estimates body fat based on height and weight. Your health care provider can help determine your BMI and help you achieve or maintain a healthy weight.  For females 69 years of age and older: ? A BMI below 18.5 is considered underweight. ? A BMI of 18.5 to 24.9 is normal. ? A BMI of 25 to 29.9 is considered overweight. ? A BMI of 30 and above is considered obese.  Watch levels of cholesterol and blood lipids  You should start having your blood tested for lipids and cholesterol at 69 years of age, then have this test every 5 years.  You may need to have your cholesterol levels checked more often if: ? Your lipid or cholesterol levels are high. ? You are older than 69 years of age. ? You are at high risk for heart disease.  Cancer screening Lung Cancer  Lung cancer screening is recommended for adults 32-28 years old who are at high risk for lung cancer because  of a history of smoking.  A yearly low-dose CT scan of the lungs is recommended for people who: ? Currently smoke. ? Have quit within the past 15 years. ? Have at least a 30-pack-year history of smoking. A pack year is smoking an average of one pack of cigarettes a day for 1 year.  Yearly screening should continue until it has been 15 years since you quit.  Yearly screening should stop if you develop a health problem that would prevent you from having lung cancer treatment.  Breast Cancer  Practice breast self-awareness. This means understanding how your breasts normally appear and feel.  It also means doing regular breast self-exams. Let your health care  provider know about any changes, no matter how small.  If you are in your 20s or 30s, you should have a clinical breast exam (CBE) by a health care provider every 1-3 years as part of a regular health exam.  If you are 29 or older, have a CBE every year. Also consider having a breast X-ray (mammogram) every year.  If you have a family history of breast cancer, talk to your health care provider about genetic screening.  If you are at high risk for breast cancer, talk to your health care provider about having an MRI and a mammogram every year.  Breast cancer gene (BRCA) assessment is recommended for women who have family members with BRCA-related cancers. BRCA-related cancers include: ? Breast. ? Ovarian. ? Tubal. ? Peritoneal cancers.  Results of the assessment will determine the need for genetic counseling and BRCA1 and BRCA2 testing.  Cervical Cancer Your health care provider may recommend that you be screened regularly for cancer of the pelvic organs (ovaries, uterus, and vagina). This screening involves a pelvic examination, including checking for microscopic changes to the surface of your cervix (Pap test). You may be encouraged to have this screening done every 3 years, beginning at age 13.  For women ages 28-65, health care providers may recommend pelvic exams and Pap testing every 3 years, or they may recommend the Pap and pelvic exam, combined with testing for human papilloma virus (HPV), every 5 years. Some types of HPV increase your risk of cervical cancer. Testing for HPV may also be done on women of any age with unclear Pap test results.  Other health care providers may not recommend any screening for nonpregnant women who are considered low risk for pelvic cancer and who do not have symptoms. Ask your health care provider if a screening pelvic exam is right for you.  If you have had past treatment for cervical cancer or a condition that could lead to cancer, you need Pap tests  and screening for cancer for at least 20 years after your treatment. If Pap tests have been discontinued, your risk factors (such as having a new sexual partner) need to be reassessed to determine if screening should resume. Some women have medical problems that increase the chance of getting cervical cancer. In these cases, your health care provider may recommend more frequent screening and Pap tests.  Colorectal Cancer  This type of cancer can be detected and often prevented.  Routine colorectal cancer screening usually begins at 69 years of age and continues through 69 years of age.  Your health care provider may recommend screening at an earlier age if you have risk factors for colon cancer.  Your health care provider may also recommend using home test kits to check for hidden blood in the stool.  A small camera at the end of a tube can be used to examine your colon directly (sigmoidoscopy or colonoscopy). This is done to check for the earliest forms of colorectal cancer.  Routine screening usually begins at age 46.  Direct examination of the colon should be repeated every 5-10 years through 69 years of age. However, you may need to be screened more often if early forms of precancerous polyps or small growths are found.  Skin Cancer  Check your skin from head to toe regularly.  Tell your health care provider about any new moles or changes in moles, especially if there is a change in a mole's shape or color.  Also tell your health care provider if you have a mole that is larger than the size of a pencil eraser.  Always use sunscreen. Apply sunscreen liberally and repeatedly throughout the day.  Protect yourself by wearing long sleeves, pants, a wide-brimmed hat, and sunglasses whenever you are outside.  Heart disease, diabetes, and high blood pressure  High blood pressure causes heart disease and increases the risk of stroke. High blood pressure is more likely to develop  in: ? People who have blood pressure in the high end of the normal range (130-139/85-89 mm Hg). ? People who are overweight or obese. ? People who are African American.  If you are 83-39 years of age, have your blood pressure checked every 3-5 years. If you are 55 years of age or older, have your blood pressure checked every year. You should have your blood pressure measured twice-once when you are at a hospital or clinic, and once when you are not at a hospital or clinic. Record the average of the two measurements. To check your blood pressure when you are not at a hospital or clinic, you can use: ? An automated blood pressure machine at a pharmacy. ? A home blood pressure monitor.  If you are between 24 years and 51 years old, ask your health care provider if you should take aspirin to prevent strokes.  Have regular diabetes screenings. This involves taking a blood sample to check your fasting blood sugar level. ? If you are at a normal weight and have a low risk for diabetes, have this test once every three years after 69 years of age. ? If you are overweight and have a high risk for diabetes, consider being tested at a younger age or more often. Preventing infection Hepatitis B  If you have a higher risk for hepatitis B, you should be screened for this virus. You are considered at high risk for hepatitis B if: ? You were born in a country where hepatitis B is common. Ask your health care provider which countries are considered high risk. ? Your parents were born in a high-risk country, and you have not been immunized against hepatitis B (hepatitis B vaccine). ? You have HIV or AIDS. ? You use needles to inject street drugs. ? You live with someone who has hepatitis B. ? You have had sex with someone who has hepatitis B. ? You get hemodialysis treatment. ? You take certain medicines for conditions, including cancer, organ transplantation, and autoimmune conditions.  Hepatitis C  Blood  testing is recommended for: ? Everyone born from [birth year] through 1967. ? Anyone with known risk factors for hepatitis C.  Sexually transmitted infections (STIs)  You should be screened for sexually transmitted infections (STIs) including gonorrhea and chlamydia if: ? You are sexually active and are younger than 69  years of age. ? You are older than 69 years of age and your health care provider tells you that you are at risk for this type of infection. ? Your sexual activity has changed since you were last screened and you are at an increased risk for chlamydia or gonorrhea. Ask your health care provider if you are at risk.  If you do not have HIV, but are at risk, it may be recommended that you take a prescription medicine daily to prevent HIV infection. This is called pre-exposure prophylaxis (PrEP). You are considered at risk if: ? You are sexually active and do not regularly use condoms or know the HIV status of your partner(s). ? You take drugs by injection. ? You are sexually active with a partner who has HIV.  Talk with your health care provider about whether you are at high risk of being infected with HIV. If you choose to begin PrEP, you should first be tested for HIV. You should then be tested every 3 months for as long as you are taking PrEP. Pregnancy  If you are premenopausal and you may become pregnant, ask your health care provider about preconception counseling.  If you may become pregnant, take 400 to 800 micrograms (mcg) of folic acid every day.  If you want to prevent pregnancy, talk to your health care provider about birth control (contraception). Osteoporosis and menopause  Osteoporosis is a disease in which the bones lose minerals and strength with aging. This can result in serious bone fractures. Your risk for osteoporosis can be identified using a bone density scan.  If you are 81 years of age or older, or if you are at risk for osteoporosis and fractures, ask your  health care provider if you should be screened.  Ask your health care provider whether you should take a calcium or vitamin D supplement to lower your risk for osteoporosis.  Menopause may have certain physical symptoms and risks.  Hormone replacement therapy may reduce some of these symptoms and risks. Talk to your health care provider about whether hormone replacement therapy is right for you. Follow these instructions at home:  Schedule regular health, dental, and eye exams.  Stay current with your immunizations.  Do not use any tobacco products including cigarettes, chewing tobacco, or electronic cigarettes.  If you are pregnant, do not drink alcohol.  If you are breastfeeding, limit how much and how often you drink alcohol.  Limit alcohol intake to no more than 1 drink per day for nonpregnant women. One drink equals 12 ounces of beer, 5 ounces of wine, or 1 ounces of hard liquor.  Do not use street drugs.  Do not share needles.  Ask your health care provider for help if you need support or information about quitting drugs.  Tell your health care provider if you often feel depressed.  Tell your health care provider if you have ever been abused or do not feel safe at home. This information is not intended to replace advice given to you by your health care provider. Make sure you discuss any questions you have with your health care provider. Document Released: 10/03/2010 Document Revised: 08/26/2015 Document Reviewed: 12/22/2014 Elsevier Interactive Patient Education  2018 [place] A mammogram is an X-ray of the breasts that is done to check for abnormal changes. This procedure can screen for and detect any changes that may suggest breast cancer. A mammogram can also identify other changes and variations in the breast, such  as:  Inflammation of the breast tissue (mastitis).  An infected area that contains a collection of pus (abscess).  A fluid-filled  sac (cyst).  Fibrocystic changes. This is when breast tissue becomes denser, which can make the tissue feel rope-like or uneven under the skin.  Tumors that are not cancerous (benign).  Tell a health care provider about:  Any allergies you have.  If you have breast implants.  If you have had previous breast disease, biopsy, or surgery.  If you are breastfeeding.  Any possibility that you could be pregnant, if this applies.  If you are younger than age 70.  If you have a family history of breast cancer. What are the risks? Generally, this is a safe procedure. However, problems may occur, including:  Exposure to radiation. Radiation levels are very low with this test.  The results being misinterpreted.  The need for further tests.  The inability of the mammogram to detect certain cancers.  What happens before the procedure?  Schedule your test about 1-2 weeks after your menstrual period. This is usually when your breasts are the least tender.  If you have had a mammogram done at a different facility in the past, get the mammogram X-rays or have them sent to your current exam facility in order to compare them.  Wash your breasts and under your arms the day of the test.  Do not wear deodorants, perfumes, lotions, or powders anywhere on your body on the day of the test.  Remove any jewelry from your neck.  Wear clothes that you can change into and out of easily. What happens during the procedure?  You will undress from the waist up and put on a gown.  You will stand in front of the X-ray machine.  Each breast will be placed between two plastic or glass plates. The plates will compress your breast for a few seconds. Try to stay as relaxed as possible during the procedure. This does not cause any harm to your breasts and any discomfort you feel will be very brief.  X-rays will be taken from different angles of each breast. The procedure may vary among health care  providers and hospitals. What happens after the procedure?  The mammogram will be examined by a specialist (radiologist).  You may need to repeat certain parts of the test, depending on the quality of the images. This is commonly done if the radiologist needs a better view of the breast tissue.  Ask when your test results will be ready. Make sure you get your test results.  You may resume your normal activities. This information is not intended to replace advice given to you by your health care provider. Make sure you discuss any questions you have with your health care provider. Document Released: 03/17/2000 Document Revised: 08/23/2015 Document Reviewed: 05/29/2014 Elsevier Interactive Patient Education  [employer].

## 2017-06-08 ENCOUNTER — Ambulatory Visit: Payer: Medicare HMO | Admitting: Physician Assistant

## 2017-06-11 ENCOUNTER — Encounter: Payer: Self-pay | Admitting: Physician Assistant

## 2017-06-11 ENCOUNTER — Ambulatory Visit (INDEPENDENT_AMBULATORY_CARE_PROVIDER_SITE_OTHER): Payer: Medicare HMO | Admitting: Physician Assistant

## 2017-06-11 VITALS — BP 148/90 | HR 74 | Temp 98.5°F | Ht 67.0 in | Wt 204.0 lb

## 2017-06-11 DIAGNOSIS — I10 Essential (primary) hypertension: Secondary | ICD-10-CM

## 2017-06-11 DIAGNOSIS — E0590 Thyrotoxicosis, unspecified without thyrotoxic crisis or storm: Secondary | ICD-10-CM

## 2017-06-11 NOTE — Progress Notes (Signed)
Kari Coleman is a 67 y.o. female is here to discuss: Hypertension  I acted as a Education administrator for [employer], PA-C [name], LPN  History of Present Illness:   Chief Complaint  Patient presents with  . Hypertension    Hypertension  This is a chronic problem. Episode onset: Pt here today to follow up on elevated blood pressure. It has been running 833-825'K systolic, diastolic 53'Z. The problem has been gradually worsening since onset. The problem is uncontrolled. Associated symptoms include blurred vision and malaise/fatigue. Pertinent negatives include no chest pain, headaches, neck pain, palpitations, peripheral edema or shortness of breath. Risk factors for coronary artery disease include post-menopausal state. Compliance problems include exercise.    She is currently on: Norvasc 10 mg, Prinzide 20-25 mg, Toprol XL 100 mg. BP Readings from Last 3 Encounters:  06/11/17 (!) 148/90  06/07/17 (!) 172/100  05/25/17 140/90   She has not yet gone to see endocrinology since our last visit. She reports that she hasn't been there in over a year and is not sure she can go back, possibly due to financial reasons. She reports that she is taking her Tapazole 10 mg daily. Thyroid labs have not been checked in at least one year.   Health Maintenance Due  Topic Date Due  . Hepatitis C Screening  Mar 07, 1951  . COLONOSCOPY  09/17/1998    Past Medical History:  Diagnosis Date  . Cervical cancer ([place])   . Glaucoma 2017  . History of echocardiogram    Echo 9/16:  Mild LVH, EF 55-60%, no RWMA, Gr 1 DD, mild MR // Echo 1/17:  Mild LVH, EF 50-55%, Gr 1 DD, mild to mod MR, mild LAE  . Hypertension   . Hyperthyroidism    s/p RAI treatment  . 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 Tobacco abuse   . Urinary incontinence      Social History   Socioeconomic History  . Marital status: Single    Spouse name: Not on file  . Number of children: 2  . Years of education: Not on  file  . Highest education level: Not on file  Social Needs  . Financial resource strain: Not on file  . Food insecurity - worry: Not on file  . Food insecurity - inability: Not on file  . Transportation needs - medical: Not on file  . Transportation needs - non-medical: Not on file  Occupational History  . Occupation: CNA    Comment: [employer]  Tobacco Use  . Smoking status: Former Smoker    Packs/day: 0.50    Years: 48.00    Pack years: 24.00    Types: Cigarettes    Start date: 11/30/1968    Last attempt to quit: 10/31/2016    Years since quitting: 0.6  . Smokeless tobacco: Never Used  . Tobacco comment: did  not smoke more than .5 pack   Substance and Sexual Activity  . Alcohol use: No    Alcohol/week: 0.0 oz    Comment: rarely   . Drug use: No  . Sexual activity: Yes  Other Topics Concern  . Not on file  Social History Narrative   Caregiver   Daughter and grandson in household    Past Surgical History:  Procedure Laterality Date  . REPAIR OF COMPLEX TRACTION RETINAL DETACHMENT    . TOTAL ABDOMINAL HYSTERECTOMY W/ BILATERAL SALPINGOOPHORECTOMY      Family History  Problem Relation Age  of Onset  . Hypertension Mother   . Heart failure Mother   . Diabetes Mother   . Pancreatic cancer Father   . Colon cancer Maternal Aunt   . Cancer Sister   . Thyroid disease Paternal Aunt   . Cancer Paternal Aunt   . Thyroid disease Unknown        paternal side  . Cancer Maternal Grandfather   . Diabetes Paternal Grandmother   . Diabetes Maternal Uncle     PMHx, SurgHx, SocialHx, FamHx, Medications, and Allergies were reviewed in the Visit Navigator and updated as appropriate.   Patient Active Problem List   Diagnosis Date Noted  . Hypertension   . Hyperthyroidism     Social History   Tobacco Use  . Smoking status: Former Smoker    Packs/day: 0.50    Years: 48.00    Pack years: 24.00    Types: Cigarettes    Start date: 11/30/1968    Last attempt to quit:  10/31/2016    Years since quitting: 0.6  . Smokeless tobacco: Never Used  . Tobacco comment: did  not smoke more than .5 pack   Substance Use Topics  . Alcohol use: No    Alcohol/week: 0.0 oz    Comment: rarely   . Drug use: No    Current Medications and Allergies:    Current Outpatient Medications:  .  ADULT ASPIRIN EC LOW STRENGTH PO, Take by mouth., Disp: , Rfl:  .  albuterol (PROVENTIL HFA;VENTOLIN HFA) 108 (90 Base) MCG/ACT inhaler, Inhale 2 puffs into the lungs every 6 (six) hours as needed for wheezing or shortness of breath., Disp: 1 Inhaler, Rfl: 0 .  amLODipine (NORVASC) 10 MG tablet, Take 1 tablet (10 mg total) by mouth daily., Disp: 30 tablet, Rfl: 0 .  latanoprost (XALATAN) 0.005 % ophthalmic solution, Place into both eyes daily. As directed, Disp: , Rfl:  .  lisinopril-hydrochlorothiazide (PRINZIDE,ZESTORETIC) 20-25 MG tablet, Take 1 tablet by mouth daily., Disp: 30 tablet, Rfl: 0 .  methimazole (TAPAZOLE) 10 MG tablet, Take 10 mg by mouth daily. , Disp: , Rfl:  .  metoprolol succinate (TOPROL-XL) 100 MG 24 hr tablet, Take 1 tablet (100 mg total) by mouth daily. Take with or immediately following a meal., Disp: 30 tablet, Rfl: 0   Allergies  Allergen Reactions  . Doxycycline Nausea Only  . Codeine Nausea And Vomiting  . Pollen Extract     Review of Systems   Review of Systems  Constitutional: Positive for malaise/fatigue.  Eyes: Positive for blurred vision.  Respiratory: Negative for shortness of breath.   Cardiovascular: Negative for chest pain and palpitations.  Musculoskeletal: Negative for neck pain.  Neurological: Negative for headaches.    Vitals:   Vitals:   06/11/17 1150  BP: (!) 148/90  Pulse: 74  Temp: 98.5 F (36.9 C)  TempSrc: Oral  SpO2: 95%  Weight: 204 lb (92.5 kg)  Height: 5\' 7"  (1.702 m)     Body mass index is 31.95 kg/m.   Physical Exam:    Physical Exam  Constitutional: She appears well-developed. She is cooperative.   Non-toxic appearance. She does not have a sickly appearance. She does not appear ill. No distress.  Cardiovascular: Normal rate, regular rhythm, S1 normal, S2 normal, normal heart sounds and normal pulses.  No LE edema  Pulmonary/Chest: Effort normal and breath sounds normal.  Neurological: She is alert. GCS eye subscore is 4. GCS verbal subscore is 5. GCS motor subscore is 6.  Skin: Skin is warm, dry and intact.  Psychiatric: She has a normal mood and affect. Her speech is normal and behavior is normal.  Nursing note and vitals reviewed.    Assessment and Plan:    Kari Coleman was seen today for hypertension.  Diagnoses and all orders for this visit:  Hypertension, unspecified type Continue current medications. Using BP goal of <150/90, patient is controlled. I had a long talk with patient about being compliant with thyroid medications and seeing her endocrinologist. Patient verbalized understanding. Discussed that she continue to monitor her blood pressure and keep [place] posted on them, especially if <150/90.  Hyperthyroidism I will re-check labs today as this has not been done in some time. Currently on Tapazole 10mg , further management based on labs. Will ultimately need management by an endocrinologist -- I have put in a referral to [facility] endocrinology. -     T3, free -     T4, free -     TSH -     Ambulatory referral to Endocrinology   . Reviewed expectations re: course of current medical issues. . Discussed self-management of symptoms. . Outlined signs and symptoms indicating need for more acute intervention. . Patient verbalized understanding and all questions were answered. . See orders for this visit as documented in the electronic medical record. . Patient received an After Visit Summary.  CMA or LPN served as scribe during this visit. History, Physical, and Plan performed by medical provider. Documentation and orders reviewed and attested to.  Inda Coke, PA-C [place],  [place] 06/11/2017  Follow-up: No Follow-up on file.

## 2017-06-11 NOTE — Patient Instructions (Signed)
It was great to see you!   

## 2017-06-12 LAB — T4, FREE: Free T4: 0.9 ng/dL (ref 0.60–1.60)

## 2017-06-12 LAB — TSH: TSH: <0.01 u[IU]/mL — ABNORMAL LOW (ref 0.35–4.50)

## 2017-06-12 LAB — T3, FREE: T3, Free: 5.1 pg/mL — ABNORMAL HIGH (ref 2.3–4.2)

## 2017-07-03 ENCOUNTER — Ambulatory Visit
Admission: RE | Admit: 2017-07-03 | Discharge: 2017-07-03 | Disposition: A | Discharge status: Home / Self Care | Payer: Medicare HMO | Source: Ambulatory Visit | Attending: Physician Assistant | Admitting: Physician Assistant

## 2017-07-03 ENCOUNTER — Encounter: Payer: Self-pay | Admitting: *Deleted

## 2017-07-03 DIAGNOSIS — Z1231 Encounter for screening mammogram for malignant neoplasm of breast: Secondary | ICD-10-CM

## 2017-07-03 DIAGNOSIS — E2839 Other primary ovarian failure: Secondary | ICD-10-CM

## 2017-08-17 ENCOUNTER — Other Ambulatory Visit: Payer: Self-pay

## 2017-08-17 MED ORDER — METOPROLOL SUCCINATE ER 100 MG PO TB24: 100.0000 mg | ORAL_TABLET | Freq: Every day | ORAL | 0 refills | Status: DC

## 2017-08-21 ENCOUNTER — Encounter: Payer: Self-pay | Admitting: Endocrinology

## 2017-08-21 ENCOUNTER — Ambulatory Visit (INDEPENDENT_AMBULATORY_CARE_PROVIDER_SITE_OTHER): Payer: Medicare HMO | Admitting: Endocrinology

## 2017-08-21 VITALS — BP 128/68 | HR 83 | Wt 205.6 lb

## 2017-08-21 DIAGNOSIS — E0590 Thyrotoxicosis, unspecified without thyrotoxic crisis or storm: Secondary | ICD-10-CM | POA: Diagnosis not present

## 2017-08-21 NOTE — Patient Instructions (Signed)

## 2017-08-21 NOTE — Progress Notes (Signed)
Subjective:    Patient ID: Kari Coleman, female    DOB: 1950/09/19, 67 y.o.   MRN: [id number]  HPI Pt is referred by for hyperthyroidism.  Pt reports he was dx'ed with hyperthyroidism in 2013.  she has never had XRT to the anterior neck, or thyroid surgery.   she does not consume kelp or any other non-prescribed thyroid medication.  she has never been on amiodarone.   She was rx'ed tapazole, but has taken intermittently, and not recently.  She has slight weight gain, and assoc heartburn.   Past Medical History:  Diagnosis Date  . Cervical cancer ([place])   . Glaucoma 2017  . History of echocardiogram    Echo 9/16:  Mild LVH, EF 55-60%, no RWMA, Gr 1 DD, mild MR // Echo 1/17:  Mild LVH, EF 50-55%, Gr 1 DD, mild to mod MR, mild LAE  . Hypertension   . Hyperthyroidism    s/p RAI treatment  . 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 Tobacco abuse   . Urinary incontinence     Past Surgical History:  Procedure Laterality Date  . REPAIR OF COMPLEX TRACTION RETINAL DETACHMENT    . TOTAL ABDOMINAL HYSTERECTOMY W/ BILATERAL SALPINGOOPHORECTOMY      Social History   Socioeconomic History  . Marital status: Single    Spouse name: Not on file  . Number of children: 2  . Years of education: Not on file  . Highest education level: Not on file  Occupational History  . Occupation: CNA    Comment: [place]  . Financial resource strain: Not on file  . Food insecurity:    Worry: Not on file    Inability: Not on file  . Transportation needs:    Medical: Not on file    Non-medical: Not on file  Tobacco Use  . Smoking status: Former Smoker    Packs/day: 0.50    Years: 48.00    Pack years: 24.00    Types: Cigarettes    Start date: 11/30/1968    Last attempt to quit: 10/31/2016    Years since quitting: 0.8  . Smokeless tobacco: Never Used  . Tobacco comment: did  not smoke more than .5 pack   Substance and Sexual Activity  . Alcohol use: No   Alcohol/week: 0.0 oz    Comment: rarely   . Drug use: No  . Sexual activity: Yes  Lifestyle  . Physical activity:    Days per week: Not on file    Minutes per session: Not on file  . Stress: Not on file  Relationships  . Social connections:    Talks on phone: Not on file    Gets together: Not on file    Attends religious service: Not on file    Active member of club or organization: Not on file    Attends meetings of clubs or organizations: Not on file    Relationship status: Not on file  . Intimate partner violence:    Fear of current or ex partner: Not on file    Emotionally abused: Not on file    Physically abused: Not on file    Forced sexual activity: Not on file  Other Topics Concern  . Not on file  Social History Narrative   Caregiver   Daughter and grandson in household    Current Outpatient Medications on File Prior to Visit  Medication Sig Dispense Refill  .  ADULT ASPIRIN EC LOW STRENGTH PO Take by mouth.    Marland Kitchen albuterol (PROVENTIL HFA;VENTOLIN HFA) 108 (90 Base) MCG/ACT inhaler Inhale 2 puffs into the lungs every 6 (six) hours as needed for wheezing or shortness of breath. 1 Inhaler 0  . amLODipine (NORVASC) 10 MG tablet Take 1 tablet (10 mg total) by mouth daily. 30 tablet 0  . latanoprost (XALATAN) 0.005 % ophthalmic solution Place into both eyes daily. As directed    . lisinopril-hydrochlorothiazide (PRINZIDE,ZESTORETIC) 20-25 MG tablet Take 1 tablet by mouth daily. 30 tablet 0  . metoprolol succinate (TOPROL-XL) 100 MG 24 hr tablet Take 1 tablet (100 mg total) by mouth daily. Take with or immediately following a meal. 30 tablet 0   No current facility-administered medications on file prior to visit.     Allergies  Allergen Reactions  . Doxycycline Nausea Only  . Codeine Nausea And Vomiting  . Pollen Extract     Family History  Problem Relation Age of Onset  . Hypertension Mother   . Heart failure Mother   . Diabetes Mother   . Pancreatic cancer  Father   . Colon cancer Maternal Aunt   . Cancer Sister   . Thyroid disease Paternal Aunt   . Cancer Paternal Aunt   . Thyroid disease Unknown        paternal side  . Cancer Maternal Grandfather   . Diabetes Paternal Grandmother   . Diabetes Maternal Uncle     BP 128/68   Pulse 83   Wt 205 lb 9.6 oz (93.3 kg)   SpO2 97%   BMI 32.20 kg/m    Review of Systems denies fever, headache, hoarseness, diplopia, palpitations, sob, diarrhea, muscle weakness, edema, excessive diaphoresis, tremor, anxiety, heat intolerance, easy bruising, and rhinorrhea.  She has nocturia.      Objective:   Physical Exam VS: see vs page GEN: no distress HEAD: head: no deformity eyes: no periorbital swelling, no proptosis external nose and ears are normal mouth: no lesion seen NECK: I can feel the right thyroid nodule only CHEST WALL: no deformity LUNGS: clear to auscultation CV: reg rate and rhythm, no murmur ABD: abdomen is soft, nontender.  no hepatosplenomegaly.  not distended.  no hernia MUSCULOSKELETAL: muscle bulk and strength are grossly normal.  no obvious joint swelling.  gait is normal and steady EXTEMITIES: no deformity.  no ulcer on the feet.  feet are of normal color and temp.  no edema PULSES: dorsalis pedis intact bilat.  no carotid bruit NEURO:  cn 2-12 grossly intact.   readily moves all 4's.  sensation is intact to touch on the feet SKIN:  Normal texture and temperature.  No rash or suspicious lesion is visible.   NODES:  None palpable at the neck PSYCH: alert, well-oriented.  Does not appear anxious nor depressed.   Lab Results  Component Value Date   TSH <0.01 Repeated and verified X2. (L) 06/11/2017   [place] (2015): multi nodular goiter. The dominant approximately 2.5 cm nodule within the mid aspect of the right lobe of the thyroid as well as the dominant approximately 2.2 cm nodule within the superior, anterior aspect the left lobe of the thyroid  Nuclear med scan:  multinodular goiter with normal 24 hour radioiodine uptake (24%).  Cytology (2016) [place] CATEGORY 2 and 3.  Lab Results  Component Value Date   TSH <0.01 Repeated and verified X2. (L) 06/11/2017   I have reviewed outside records, and summarized: Pt was noted to have  hyperthyroidism, and referred here.  She was seen for HTN.  She was overdue for endocrinology f/u     Assessment & Plan:  Multinodular goiter, new to me. Hyperthyroidism: prob due to the goiter.   Patient Instructions  let's check a thyroid "scan" (a special, but easy and painless type of thyroid x ray).  It works like this: you go to the x-ray department of the hospital to swallow a pill, which contains a miniscule amount of radiation.  You will not notice any symptoms from this.  You will go back to the x-ray department the next day, to lie down in front of a camera.  The results of this will be sent to me.   Based on the results, i hope to order for you a treatment pill of radioactive iodine.  Although it is a larger amount of radiation, you will again notice no symptoms from this.  The pill is gone from your body in a few days (during which you should stay away from other people), but takes several months to work.  Therefore, please return here approximately 6-8 weeks after the treatment.  This treatment has been available for many years, and the only known side-effect is an underactive thyroid.  It is possible that i would eventually prescribe for you a thyroid hormone pill, which is very inexpensive.  You don't have to worry about side-effects of this thyroid hormone pill, because it is the same molecule your thyroid makes.

## 2017-08-30 DIAGNOSIS — H401131 Primary open-angle glaucoma, bilateral, mild stage: Secondary | ICD-10-CM | POA: Diagnosis not present

## 2017-08-30 DIAGNOSIS — H2513 Age-related nuclear cataract, bilateral: Secondary | ICD-10-CM | POA: Diagnosis not present

## 2017-09-06 DIAGNOSIS — M792 Neuralgia and neuritis, unspecified: Secondary | ICD-10-CM | POA: Diagnosis not present

## 2017-09-06 DIAGNOSIS — G8929 Other chronic pain: Secondary | ICD-10-CM | POA: Diagnosis not present

## 2017-09-06 DIAGNOSIS — R32 Unspecified urinary incontinence: Secondary | ICD-10-CM | POA: Diagnosis not present

## 2017-09-06 DIAGNOSIS — E669 Obesity, unspecified: Secondary | ICD-10-CM | POA: Diagnosis not present

## 2017-09-06 DIAGNOSIS — K219 Gastro-esophageal reflux disease without esophagitis: Secondary | ICD-10-CM | POA: Diagnosis not present

## 2017-09-06 DIAGNOSIS — I10 Essential (primary) hypertension: Secondary | ICD-10-CM | POA: Diagnosis not present

## 2017-09-06 DIAGNOSIS — M1990 Unspecified osteoarthritis, unspecified site: Secondary | ICD-10-CM | POA: Diagnosis not present

## 2017-09-06 DIAGNOSIS — Z791 Long term (current) use of non-steroidal anti-inflammatories (NSAID): Secondary | ICD-10-CM | POA: Diagnosis not present

## 2017-09-06 DIAGNOSIS — H409 Unspecified glaucoma: Secondary | ICD-10-CM | POA: Diagnosis not present

## 2017-09-06 DIAGNOSIS — K08409 Partial loss of teeth, unspecified cause, unspecified class: Secondary | ICD-10-CM | POA: Diagnosis not present

## 2017-10-31 ENCOUNTER — Telehealth: Payer: Self-pay | Admitting: Endocrinology

## 2017-10-31 NOTE — Telephone Encounter (Signed)
I have tried to call patient, but no VM set up.

## 2017-10-31 NOTE — Telephone Encounter (Signed)
Please call patient at ph# [phone] re: patient has been waiting for a call-she was told at her last appt in May that someone would be calling her about a thyroid scan that requires swallowing a pill being done and she has heard nothing so far.

## 2017-10-31 NOTE — Telephone Encounter (Signed)
Kari Coleman is out of the office & this was ordered in May, but patient has heard nothing. She is concerned. Could you possibly check into this for me? I will call patient to let her know we are looking into this.

## 2017-11-02 ENCOUNTER — Telehealth: Payer: Self-pay | Admitting: Endocrinology

## 2017-11-02 DIAGNOSIS — E0590 Thyrotoxicosis, unspecified without thyrotoxic crisis or storm: Secondary | ICD-10-CM

## 2017-11-02 NOTE — Telephone Encounter (Signed)
Patient called today-ph# [phone].  I explained to patient that her voice mail is not set up and she said that she is not good with those things but will work on it today. Please call patient at the above phone#.

## 2017-11-05 NOTE — Telephone Encounter (Signed)
RAI treatment pill was ordered back in May. Patient never received a phone call regarding this. However, after some thought patient doesn't think she wants to go to that drastic of measures. She was on methimazole before & she would like to just resume taking that. She wanted advise & to know if that was alright. She also would need prescription etc.

## 2017-11-05 NOTE — Telephone Encounter (Signed)
[place], we need to recheck blood tests.  I have ordered.  Please come in to have drawn

## 2017-11-06 NOTE — Telephone Encounter (Signed)
I have called patient & scheduled her for lab appointment next Wednesday.

## 2017-11-08 ENCOUNTER — Ambulatory Visit (INDEPENDENT_AMBULATORY_CARE_PROVIDER_SITE_OTHER): Payer: Medicare HMO | Admitting: Physician Assistant

## 2017-11-08 ENCOUNTER — Other Ambulatory Visit: Payer: Self-pay | Admitting: Endocrinology

## 2017-11-08 ENCOUNTER — Encounter: Payer: Self-pay | Admitting: Physician Assistant

## 2017-11-08 VITALS — BP 144/92 | HR 68 | Ht 67.0 in | Wt 205.2 lb

## 2017-11-08 DIAGNOSIS — I10 Essential (primary) hypertension: Secondary | ICD-10-CM | POA: Diagnosis not present

## 2017-11-08 DIAGNOSIS — E0590 Thyrotoxicosis, unspecified without thyrotoxic crisis or storm: Secondary | ICD-10-CM

## 2017-11-08 DIAGNOSIS — M25561 Pain in right knee: Secondary | ICD-10-CM

## 2017-11-08 DIAGNOSIS — Z1159 Encounter for screening for other viral diseases: Secondary | ICD-10-CM | POA: Diagnosis not present

## 2017-11-08 DIAGNOSIS — R0789 Other chest pain: Secondary | ICD-10-CM

## 2017-11-08 LAB — COMPREHENSIVE METABOLIC PANEL
ALT: 21 U/L (ref 0–35)
AST: 17 U/L (ref 0–37)
Albumin: 4.2 g/dL (ref 3.5–5.2)
Alkaline Phosphatase: 107 U/L (ref 39–117)
BILIRUBIN TOTAL: 0.6 mg/dL (ref 0.2–1.2)
BUN: 20 mg/dL (ref 6–23)
CALCIUM: 10.6 mg/dL — AB (ref 8.4–10.5)
CO2: 29 meq/L (ref 19–32)
Chloride: 102 mEq/L (ref 96–112)
Creatinine, Ser: 0.87 mg/dL (ref 0.40–1.20)
GFR: 82.99 mL/min (ref >60.00)
Glucose, Bld: 104 mg/dL — ABNORMAL HIGH (ref 70–99)
Potassium: 3.7 mEq/L (ref 3.5–5.1)
Sodium: 138 mEq/L (ref 135–145)
Total Protein: 8.2 g/dL (ref 6.0–8.3)

## 2017-11-08 LAB — CBC
HCT: 39.8 % (ref 36.0–46.0)
HEMOGLOBIN: 13.6 g/dL (ref 12.0–15.0)
MCHC: 34.2 g/dL (ref 30.0–36.0)
MCV: 85 fl (ref 78.0–100.0)
PLATELETS: 295 10*3/uL (ref 150.0–400.0)
RBC: 4.68 Mil/uL (ref 3.87–5.11)
RDW: 12.3 % (ref 11.5–15.5)
WBC: 5.9 10*3/uL (ref 4.0–10.5)

## 2017-11-08 LAB — T4, FREE: FREE T4: 1.07 ng/dL (ref 0.60–1.60)

## 2017-11-08 LAB — TSH: TSH: <0.01 u[IU]/mL — ABNORMAL LOW (ref 0.35–4.50)

## 2017-11-08 MED ORDER — LISINOPRIL-HYDROCHLOROTHIAZIDE 20-25 MG PO TABS: 1.0000 | ORAL_TABLET | Freq: Every day | ORAL | 0 refills | Status: DC

## 2017-11-08 MED ORDER — METOPROLOL SUCCINATE ER 100 MG PO TB24: 100.0000 mg | ORAL_TABLET | Freq: Every day | ORAL | 0 refills | Status: DC

## 2017-11-08 MED ORDER — AMLODIPINE BESYLATE 10 MG PO TABS: 10.0000 mg | ORAL_TABLET | Freq: Every day | ORAL | 0 refills | Status: DC

## 2017-11-08 NOTE — Progress Notes (Signed)
Kari Coleman is a 67 y.o. female is here to discuss:  History of Present Illness:   Chief Complaint  Patient presents with  . Follow-up    HTN - BP this morning was aroud 160/90. BP has been fluctuating. She denies HA, visual changes.     HPI   Hypertension Currently taking Norvas 10 mg, Prinzide 20-25 mg, Toprol XL 100 mg. At home blood pressure readings are: 160/90 this morning. Patient denies chest pain, SOB, blurred vision, dizziness, unusual headaches, lower leg swelling. Patient is compliant with medication. Denies excessive caffeine intake, stimulant usage, excessive alcohol intake, or increase in salt consumption.  Knee Pain Has had right knee pain for several years, did receive a steroid injection in the past, and did have relief with it.  She cannot recall when she had this performed, thinks it was several years ago.  She takes occasional ibuprofen and Tylenol for her pain.  She thinks that she is having a little bit of swelling today.  She is having difficulty ambulating at times due to pain.  Heaviness in Chest She reports that heat she has been having intermittent heaviness, mostly at night when she goes to lay down.  After she settles down for the day, when she goes to lay down, and she is resting, she has the sensation of a heavy chest.  Sometimes feels like she needs to take a deep breath.  Denies cough, snoring, orthopnea.  She has had this for several months now, without any work-up.  Denies any exertional component whatsoever.  Hyperthyroidism She saw Dr. Loanne Drilling since she last saw me. Per chart records, she was going to have a RAI scan but it was never scheduled. She is now considering resuming tapazole. Dr. Loanne Drilling has ordered labs, they are pending at this time.  Lab Results  Component Value Date   TSH <0.01 Repeated and verified X2. (L) 06/11/2017    Wt Readings from Last 5 Encounters:  11/08/17 205 lb 3.2 oz (93.1 kg)  08/21/17 205 lb 9.6 oz (93.3 kg)    06/11/17 204 lb (92.5 kg)  06/07/17 204 lb 8 oz (92.8 kg)  05/25/17 202 lb (91.6 kg)     Health Maintenance Due  Topic Date Due  . Hepatitis C Screening  15-Dec-1950  . COLONOSCOPY  09/17/1998    Past Medical History:  Diagnosis Date  . Cervical cancer ([place])   . Glaucoma 2017  . History of echocardiogram    Echo 9/16:  Mild LVH, EF 55-60%, no RWMA, Gr 1 DD, mild MR // Echo 1/17:  Mild LVH, EF 50-55%, Gr 1 DD, mild to mod MR, mild LAE  . Hypertension   . Hyperthyroidism    s/p RAI treatment  . 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 Tobacco abuse   . Urinary incontinence      Social History   Socioeconomic History  . Marital status: Single    Spouse name: Not on file  . Number of children: 2  . Years of education: Not on file  . Highest education level: Not on file  Occupational History  . Occupation: CNA    Comment: [place]  . Financial resource strain: Not on file  . Food insecurity:    Worry: Not on file    Inability: Not on file  . Transportation needs:    Medical: Not on file    Non-medical: Not on file  Tobacco Use  .  Smoking status: Former Smoker    Packs/day: 0.50    Years: 48.00    Pack years: 24.00    Types: Cigarettes    Start date: 11/30/1968    Last attempt to quit: 10/31/2016    Years since quitting: 1.0  . Smokeless tobacco: Never Used  . Tobacco comment: did  not smoke more than .5 pack   Substance and Sexual Activity  . Alcohol use: No    Alcohol/week: 0.0 standard drinks    Comment: rarely   . Drug use: No  . Sexual activity: Yes  Lifestyle  . Physical activity:    Days per week: Not on file    Minutes per session: Not on file  . Stress: Not on file  Relationships  . Social connections:    Talks on phone: Not on file    Gets together: Not on file    Attends religious service: Not on file    Active member of club or organization: Not on file    Attends meetings of clubs or organizations: Not on  file    Relationship status: Not on file  . Intimate partner violence:    Fear of current or ex partner: Not on file    Emotionally abused: Not on file    Physically abused: Not on file    Forced sexual activity: Not on file  Other Topics Concern  . Not on file  Social History Narrative   Caregiver   Daughter and grandson in household    Past Surgical History:  Procedure Laterality Date  . REPAIR OF COMPLEX TRACTION RETINAL DETACHMENT    . TOTAL ABDOMINAL HYSTERECTOMY W/ BILATERAL SALPINGOOPHORECTOMY      Family History  Problem Relation Age of Onset  . Hypertension Mother   . Heart failure Mother   . Diabetes Mother   . Pancreatic cancer Father   . Colon cancer Maternal Aunt   . Cancer Sister   . Thyroid disease Paternal Aunt   . Cancer Paternal Aunt   . Thyroid disease Unknown        paternal side  . Cancer Maternal Grandfather   . Diabetes Paternal Grandmother   . Diabetes Maternal Uncle     PMHx, SurgHx, SocialHx, FamHx, Medications, and Allergies were reviewed in the Visit Navigator and updated as appropriate.   Patient Active Problem List   Diagnosis Date Noted  . Primary osteoarthritis of right knee 01/04/2016  . Hypertension   . Hyperthyroidism     Social History   Tobacco Use  . Smoking status: Former Smoker    Packs/day: 0.50    Years: 48.00    Pack years: 24.00    Types: Cigarettes    Start date: 11/30/1968    Last attempt to quit: 10/31/2016    Years since quitting: 1.0  . Smokeless tobacco: Never Used  . Tobacco comment: did  not smoke more than .5 pack   Substance Use Topics  . Alcohol use: No    Alcohol/week: 0.0 standard drinks    Comment: rarely   . Drug use: No    Current Medications and Allergies:    Current Outpatient Medications:  .  ADULT ASPIRIN EC LOW STRENGTH PO, Take by mouth., Disp: , Rfl:  .  albuterol (PROVENTIL HFA;VENTOLIN HFA) 108 (90 Base) MCG/ACT inhaler, Inhale 2 puffs into the lungs every 6 (six) hours as needed  for wheezing or shortness of breath., Disp: 1 Inhaler, Rfl: 0 .  amLODipine (NORVASC) 10 MG tablet, Take  1 tablet (10 mg total) by mouth daily., Disp: 90 tablet, Rfl: 0 .  latanoprost (XALATAN) 0.005 % ophthalmic solution, Place into both eyes daily. As directed, Disp: , Rfl:  .  lisinopril-hydrochlorothiazide (PRINZIDE,ZESTORETIC) 20-25 MG tablet, Take 1 tablet by mouth daily., Disp: 90 tablet, Rfl: 0 .  metoprolol succinate (TOPROL-XL) 100 MG 24 hr tablet, Take 1 tablet (100 mg total) by mouth daily. Take with or immediately following a meal., Disp: 90 tablet, Rfl: 0   Allergies  Allergen Reactions  . Doxycycline Nausea Only  . Codeine Nausea And Vomiting  . Pollen Extract     Review of Systems   ROS  Negative unless otherwise specified per HPI.  Vitals:   Vitals:   11/08/17 0914  BP: (!) 144/92  Pulse: 68  SpO2: 95%  Weight: 205 lb 3.2 oz (93.1 kg)  Height: 5\' 7"  (1.702 m)     Body mass index is 32.14 kg/m.   Physical Exam:    Physical Exam  Constitutional: She appears well-developed. She is cooperative.  Non-toxic appearance. She does not have a sickly appearance. She does not appear ill. No distress.  Cardiovascular: Normal rate, regular rhythm, S1 normal, S2 normal, normal heart sounds and normal pulses.  No LE edema  Pulmonary/Chest: Effort normal and breath sounds normal.  Musculoskeletal:  R Knee: Overall joint is well aligned, no significant deformity.   No significant effusion.   Extensor mechanism intact Endorses pain with resisted extension and flexion.  Mild swelling to anterior aspect of knee.    Neurological: She is alert. She has normal strength. Gait abnormal. GCS eye subscore is 4. GCS verbal subscore is 5. GCS motor subscore is 6.  Skin: Skin is warm, dry and intact.  Psychiatric: She has a normal mood and affect. Her speech is normal and behavior is normal.  Nursing note and vitals reviewed.  EKG tracing is personally reviewed.  EKG notes NSR.   No acute changes when compared to prior EKGs.  Assessment and Plan:    Laquanta was seen today for follow-up.  Diagnoses and all orders for this visit:  Essential hypertension Will continue current regimen.  I do think that once we get her thyroid under control, her blood pressure will also be improved.  Her EKG was reviewed in office with Dr. Garret Reddish today, no acute changes noted when compared to previous EKGs.  Will check labs.  Follow-up in 1 month, sooner if needed. -     EKG 12-Lead -     CBC -     Comprehensive metabolic panel -     ECHOCARDIOGRAM COMPLETE; Future  Hyperthyroidism She has an appointment pending when she repeats her labs for Dr. Loanne Drilling.  She is hoping to resume methimazole.  Remains uncontrolled at this time.  Encouraged close follow-up with Dr. Loanne Drilling. -     EKG 12-Lead -     ECHOCARDIOGRAM COMPLETE; Future -     TSH -     T4, free  Chest heaviness EKG was reviewed with Dr. Garret Reddish in office today.  No acute changes when compared to prior EKGs.  I do think that she has a little bit of strain secondary to uncontrolled hyperthyroidism.  I do think an echo is indicated at this time, if any abnormalities will refer to cardiology.  Follow-up in 1 month, sooner if needed. -     EKG 12-Lead -     CBC -     Comprehensive metabolic panel -  ECHOCARDIOGRAM COMPLETE; Future  Recurrent pain of right knee Discussed supportive measures at this time including ice, Tylenol, occasional ibuprofen if needed.  I do think that she would benefit from seeing Dr. Teresa Coombs in our office for further evaluation, possible injections if needed.  Patient is agreeable to plan.  Encounter for screening for other viral diseases -     Hepatitis C Antibody  Other orders -     metoprolol succinate (TOPROL-XL) 100 MG 24 hr tablet; Take 1 tablet (100 mg total) by mouth daily. Take with or immediately following a meal. -     lisinopril-hydrochlorothiazide  (PRINZIDE,ZESTORETIC) 20-25 MG tablet; Take 1 tablet by mouth daily. -     amLODipine (NORVASC) 10 MG tablet; Take 1 tablet (10 mg total) by mouth daily.  . Reviewed expectations re: course of current medical issues. . Discussed self-management of symptoms. . Outlined signs and symptoms indicating need for more acute intervention. . Patient verbalized understanding and all questions were answered. . See orders for this visit as documented in the electronic medical record. . Patient received an After Visit Summary.  Inda Coke, PA-C  [place], [place] 11/08/2017  Follow-up: No follow-ups on file.

## 2017-11-08 NOTE — Patient Instructions (Addendum)
It was great to see you!  Continue current blood pressure regimen.  You will be contacted about your echocardiogram (ultrasound of your heart). If your symptoms change in any way in the meantime, please seek medical attention.  Please make an appointment here at our office with Dr. Paulla Fore at your convenience for knee pain.  Dr. Loanne Drilling should be contacting you as soon as your thyroid labs return  Let's follow-up in 1 month, sooner if you have concerns.  Take care,  Inda Coke PA-C

## 2017-11-09 LAB — HEPATITIS C ANTIBODY
Hepatitis C Ab: NONREACTIVE
SIGNAL TO CUT-OFF: 0.08 (ref <1.00)

## 2017-11-09 NOTE — Telephone Encounter (Signed)
Spoke with [facility] and they are going to reach out to the pt and get her scheduled.

## 2017-11-12 ENCOUNTER — Other Ambulatory Visit: Payer: Self-pay | Admitting: Endocrinology

## 2017-11-12 MED ORDER — METHIMAZOLE 10 MG PO TABS: 10.0000 mg | ORAL_TABLET | Freq: Three times a day (TID) | ORAL | 0 refills | Status: DC

## 2017-11-14 ENCOUNTER — Other Ambulatory Visit: Payer: Medicare HMO

## 2017-11-20 ENCOUNTER — Ambulatory Visit: Payer: Medicare HMO | Admitting: Sports Medicine

## 2017-11-23 ENCOUNTER — Ambulatory Visit: Payer: Self-pay

## 2017-11-23 ENCOUNTER — Ambulatory Visit (HOSPITAL_COMMUNITY): Payer: Medicare HMO | Attending: Cardiology

## 2017-11-23 ENCOUNTER — Other Ambulatory Visit: Payer: Self-pay

## 2017-11-23 ENCOUNTER — Encounter: Payer: Self-pay | Admitting: Sports Medicine

## 2017-11-23 ENCOUNTER — Ambulatory Visit: Payer: Medicare HMO | Admitting: Sports Medicine

## 2017-11-23 VITALS — BP 130/84 | HR 78 | Ht 67.0 in | Wt 204.2 lb

## 2017-11-23 DIAGNOSIS — M25561 Pain in right knee: Secondary | ICD-10-CM

## 2017-11-23 DIAGNOSIS — M1711 Unilateral primary osteoarthritis, right knee: Secondary | ICD-10-CM

## 2017-11-23 DIAGNOSIS — E0590 Thyrotoxicosis, unspecified without thyrotoxic crisis or storm: Secondary | ICD-10-CM | POA: Diagnosis not present

## 2017-11-23 DIAGNOSIS — Z87891 Personal history of nicotine dependence: Secondary | ICD-10-CM | POA: Diagnosis not present

## 2017-11-23 DIAGNOSIS — I340 Nonrheumatic mitral (valve) insufficiency: Secondary | ICD-10-CM | POA: Insufficient documentation

## 2017-11-23 DIAGNOSIS — I10 Essential (primary) hypertension: Secondary | ICD-10-CM | POA: Diagnosis not present

## 2017-11-23 DIAGNOSIS — R0789 Other chest pain: Secondary | ICD-10-CM | POA: Insufficient documentation

## 2017-11-23 LAB — ECHOCARDIOGRAM COMPLETE
Height: 67 in
Weight: 3267.2 oz

## 2017-11-23 NOTE — Patient Instructions (Addendum)
You had an injection today.  Things to be aware of after injection are listed below: . You may experience no significant improvement or even a slight worsening in your symptoms during the first 24 to 48 hours.  After that we expect your symptoms to improve gradually over the next 2 weeks for the medicine to have its maximal effect.  You should continue to have improvement out to 6 weeks after your injection. . Dr. Paulla Fore recommends icing the site of the injection for 20 minutes  1-2 times the day of your injection . You may shower but no swimming, tub bath or Jacuzzi for 24 hours. . If your bandage falls off this does not need to be replaced.  It is appropriate to remove the bandage after 4 hours. . You may resume light activities as tolerated unless otherwise directed per Dr. Paulla Fore during your visit  POSSIBLE STEROID SIDE EFFECTS:  Side effects from injectable steroids tend to be less than when taken orally however you may experience some of the symptoms listed below.  If experienced these should only last for a short period of time. Change in menstrual flow  Edema (swelling)  Increased appetite Skin flushing (redness)  Skin rash/acne  Thrush (oral) Yeast vaginitis    Increased sweating  Depression Increased blood glucose levels Cramping and leg/calf  Euphoria (feeling happy)  POSSIBLE PROCEDURE SIDE EFFECTS: The side effects of the injection are usually fairly minimal however if you may experience some of the following side effects that are usually self-limited and will is off on their own.  If you are concerned please feel free to call the office with questions:  Increased numbness or tingling  Nausea or vomiting  Swelling or bruising at the injection site   Please call our office if if you experience any of the following symptoms over the next week as these can be signs of infection:   Fever greater than 100.21F  Significant swelling at the injection site  Significant redness or drainage  from the injection site  If after 2 weeks you are continuing to have worsening symptoms please call our office to discuss what the next appropriate actions should be including the potential for a return office visit or other diagnostic testing.   Please perform the exercise program that we have prepared for you and gone over in detail on a daily basis.  In addition to the handout you were provided you can access your program through: [email or web address]   Your unique program code is:  [email or web address]   I recommend you obtained a compression sleeve to help with your joint problems. There are many options on the market however I recommend obtaining a knee Body Helix compression sleeve.  You can find information (including how to appropriate measure yourself for sizing) can be found at www.Body http://www.lambert.com/.  Many of these products are health savings account (HSA) eligible.   You can use the compression sleeve at any time throughout the day but is most important to use while being active as well as for 2 hours post-activity.   It is appropriate to ice following activity with the compression sleeve in place.

## 2017-11-23 NOTE — Procedures (Signed)
PROCEDURE NOTE:  Ultrasound Guided: Aspiration and Injection: Right knee Images were obtained and interpreted by myself, Teresa Coombs, DO  Images have been saved and stored to PACS system. Images obtained on: GE S7 Ultrasound machine    ULTRASOUND FINDINGS:  Marked effusion with marked synovitis  DESCRIPTION OF PROCEDURE:  The patient's clinical condition is marked by substantial pain and/or significant functional disability. Other conservative therapy has not provided relief, is contraindicated, or not appropriate. There is a reasonable likelihood that injection will significantly improve the patient's pain and/or functional impairment.   After discussing the risks, benefits and expected outcomes of the injection and all questions were reviewed and answered, the patient wished to undergo the above named procedure.  Verbal consent was obtained.  The ultrasound was used to identify the target structure and adjacent neurovascular structures. The skin was then prepped in sterile fashion and the target structure was injected under direct visualization using sterile technique as below:  Single injection performed as below: PREP: Alcohol, Ethel Chloride and 5 cc 1% lidocaine on 25g 1.5 in. needle APPROACH:superiolateral, stopcock technique, 18g 1.5 in. INJECTATE: 2 cc 0.5% Marcaine and 2 cc 40mg /mL DepoMedrol ASPIRATE: 42mL  and straw colored  DRESSING: Band-Aid and 6-inch Ace Wrap  Post procedural instructions including recommending icing and warning signs for infection were reviewed.    This procedure was well tolerated and there were no complications.   IMPRESSION: Succesful Ultrasound Guided: Aspiration and Injection

## 2017-11-23 NOTE — Progress Notes (Signed)
PROCEDURE NOTE: THERAPEUTIC EXERCISES (97110) 15 minutes spent for Therapeutic exercises as below and as referenced in the AVS.  This included exercises focusing on stretching, strengthening, with significant focus on eccentric aspects.   Proper technique shown and discussed handout in great detail with ATC.  All questions were discussed and answered.   Long term goals include an improvement in range of motion, strength, endurance as well as avoiding reinjury. Frequency of visits is one time as determined during today's  office visit. Frequency of exercises to be performed is as per handout.  EXERCISES REVIEWED: Hip ABduction strengthening with focus on Glute Medius Recruitment VMO Strengthening 

## 2017-11-23 NOTE — Progress Notes (Signed)
[name]. Santiana Glidden, [facility] [place]  MEYGAN KYSER - 67 y.o. female MRN [id number]  Date of birth: 07/08/1950  Visit Date: 11/23/2017  PCP: Inda Coke, PA   Referred by: Inda Coke, [place]  Scribe(s) for today's visit: Josepha Pigg, CMA  SUBJECTIVE:  Kari Coleman is here for Initial Assessment (R knee pain)   Her R knee pain symptoms INITIALLY: Began several years ago and she was dx with severe femorotibial compartment syndrome.  Described as moderate aching and stiffness, radiating to the anterior lower leg.  Worsened with stairs, sit to stand.  Improved with rest.  Additional associated symptoms include: She reports some swelling and stiffness around the knee. She has noticed clicking and popping in the knee. She feels like she can "hear the bone sometimes" when she gets up and down. At times she will experience shooting pains into the R leg.     At this time symptoms show no change compared to onset.  She has been taking Tylenol or IBU with no relief.    REVIEW OF SYSTEMS: Denies night time disturbances. Reports night sweats. Denies unexplained weight loss. Reports personal history of cervical cancer. Reports changes in bowel or bladder habits - worsening urinary incontinence. Denies recent unreported falls. Denies new or worsening dyspnea or wheezing. Denies headaches or dizziness.  Reports numbness, tingling or weakness in RLE.  Reports dizziness or presyncopal episodes Reports lower extremity edema     HISTORY & PERTINENT PRIOR DATA:  Significant/pertinent history, findings, studies include:  reports that she quit smoking about 12 months ago. Her smoking use included cigarettes. She started smoking about 49 years ago. She has a 24.00 pack-year smoking history. She has never used smokeless tobacco. Recent Labs    05/25/17 1504  HGBA1C 5.4   No specialty comments available. No problems  updated.  Otherwise prior history reviewed and updated per electronic medical record.    OBJECTIVE:  VS:  HT:5\' 7"  (170.2 cm)   WT:204 lb 3.2 oz (92.6 kg)  BMI:31.97    BP:130/84  HR:78bpm  TEMP: ( )  RESP:95 %   PHYSICAL EXAM: CONSTITUTIONAL: Well-developed, Well-nourished and In no acute distress Alert & appropriately interactive. and Not depressed or anxious appearing. RESPIRATORY: No increased work of breathing and Trachea Midline EYES: Pupils are equal., EOM intact without nystagmus. and No scleral icterus.  Lower extremities: Warm and well perfused Edema: Joint associated swelling as per Orthopedic exam and No Pre-tibial edema NEURO: unremarkable Normal associated myotomal distribution strength to manual muscle testing Normal sensation to light touch  MSK Exam:  Right Knee  Alignment & Contours: mild varus Skin: No overlying erythema/ecchymosis Effusion: yes and tense   Generalized Synovitis: severe Knee Tenderness: Medial joint line, Lateral joint line and No focal bony TTP Gait: antalgic Patellar grind produces: Mild pain and Severe  crepitation   RANGE OF MOTION & STRENGTH  EXTENSION: Normal  with no pain.   Strength: Normal FLEXION: Normal with no pain.   Strength: Normal   LIGAMENTOUS TESTING  Varus & Valgus Strain: stable to testing     SPECIALITY TESTING:    positive, mild pain     PROCEDURES & DATA REVIEWED:  Discussed the foundation of treatment for this condition is physical therapy and/or daily (5-6 days/week) therapeutic exercises, focusing on core strengthening, coordination, neuromuscular control/reeducation.  Therapeutic exercises prescribed per procedure note. US Guided Injection per procedure note  ASSESSMENT   1. Acute  pain of right knee   2. Primary osteoarthritis of right knee     PLAN:       . Please see procedure section and notes. . We will go ahead and inject the knee per procedure note and have them begin on hip and  knee strenghtening exersises. Additionally we discussed the merits of compression and/or bracing and recommend prophylatic compression with activity.  Icing discussed PRN.  If persistent ongoing symptoms can consider repeat injections and viscous supplementation. No problem-specific Assessment & Plan notes found for this encounter.    Follow-up: Return in about 12 weeks (around 02/15/2018).      Please see additional documentation for Objective, Assessment and Plan sections. Pertinent additional documentation may be included in corresponding procedure notes, imaging studies, problem based documentation and patient instructions. Please see these sections of the encounter for additional information regarding this visit.  CMA/ATC served as Education administrator during this visit. History, Physical, and Plan performed by medical provider. Documentation and orders reviewed and attested to.      Gerda Diss, [facility] Physician

## 2017-11-26 ENCOUNTER — Other Ambulatory Visit: Payer: Self-pay | Admitting: Physician Assistant

## 2017-11-26 DIAGNOSIS — I10 Essential (primary) hypertension: Secondary | ICD-10-CM

## 2017-11-26 DIAGNOSIS — R0789 Other chest pain: Secondary | ICD-10-CM

## 2017-11-26 DIAGNOSIS — E0590 Thyrotoxicosis, unspecified without thyrotoxic crisis or storm: Secondary | ICD-10-CM

## 2017-12-10 ENCOUNTER — Ambulatory Visit (INDEPENDENT_AMBULATORY_CARE_PROVIDER_SITE_OTHER): Payer: Medicare HMO | Admitting: Physician Assistant

## 2017-12-10 ENCOUNTER — Encounter: Payer: Self-pay | Admitting: Physician Assistant

## 2017-12-10 VITALS — BP 136/88 | HR 64 | Temp 97.9°F | Wt 204.0 lb

## 2017-12-10 DIAGNOSIS — R32 Unspecified urinary incontinence: Secondary | ICD-10-CM

## 2017-12-10 DIAGNOSIS — I10 Essential (primary) hypertension: Secondary | ICD-10-CM

## 2017-12-10 LAB — URINALYSIS, ROUTINE W REFLEX MICROSCOPIC
Bilirubin Urine: NEGATIVE
Hgb urine dipstick: NEGATIVE
Ketones, ur: NEGATIVE
Leukocytes, UA: NEGATIVE
Nitrite: NEGATIVE
RBC / HPF: NONE SEEN (ref 0–?)
Specific Gravity, Urine: 1.02 (ref 1.000–1.030)
Total Protein, Urine: NEGATIVE
Urine Glucose: NEGATIVE
Urobilinogen, UA: 0.2 (ref 0.0–1.0)
pH: 5.5 (ref 5.0–8.0)

## 2017-12-10 LAB — COMPREHENSIVE METABOLIC PANEL
ALT: 19 U/L (ref 0–35)
AST: 14 U/L (ref 0–37)
Albumin: 4.2 g/dL (ref 3.5–5.2)
Alkaline Phosphatase: 108 U/L (ref 39–117)
BILIRUBIN TOTAL: 0.7 mg/dL (ref 0.2–1.2)
BUN: 18 mg/dL (ref 6–23)
CHLORIDE: 102 meq/L (ref 96–112)
CO2: 30 meq/L (ref 19–32)
Calcium: 10.3 mg/dL (ref 8.4–10.5)
Creatinine, Ser: 1.04 mg/dL (ref 0.40–1.20)
GFR: 67.52 mL/min (ref >60.00)
GLUCOSE: 115 mg/dL — AB (ref 70–99)
Potassium: 4.1 mEq/L (ref 3.5–5.1)
Sodium: 139 mEq/L (ref 135–145)
Total Protein: 7.5 g/dL (ref 6.0–8.3)

## 2017-12-10 NOTE — Patient Instructions (Signed)
It was great to see you!  You will be contacted about your referral to urology.  I will be in touch regarding your labs and urine results.  Let's follow-up in 6 months, sooner if you have concerns.  Take care,  Inda Coke PA-C

## 2017-12-10 NOTE — Progress Notes (Signed)
Kari Coleman is a 67 y.o. female is here to discuss:   History of Present Illness:   Chief Complaint  Patient presents with  . Hypertension    HPI   HTN Currently taking Norvasc 10 mg, Lisinopril-HCTZ 20-25 mg, Metoprolol XL 100 mg. At home blood pressure readings are: <140/90. Patient denies chest pain, SOB, blurred vision, dizziness, unusual headaches, lower leg swelling. Patient is compliant with medication. Denies excessive caffeine intake, stimulant usage, excessive alcohol intake, or increase in salt consumption.  BP Readings from Last 3 Encounters:  12/10/17 136/88  11/23/17 130/84  11/08/17 (!) 144/92   Urinary Incontinence Has been dealing this with several years. Does have to wear a pull-up at night. States that she went to [facility] several years ago for this and was given a pill. She is concerned because she feels as though her symptoms are significantly worsening in terms of amount of urine that is in her pull-up at night. She has had some issues with urinary odor, but denies dysuria, abdominal pain/pressure or hematuria.  There are no preventive care reminders to display for this patient.  Past Medical History:  Diagnosis Date  . Cervical cancer ([place])   . Glaucoma 2017  . History of echocardiogram    Echo 9/16:  Mild LVH, EF 55-60%, no RWMA, Gr 1 DD, mild MR // Echo 1/17:  Mild LVH, EF 50-55%, Gr 1 DD, mild to mod MR, mild LAE  . Hypertension   . Hyperthyroidism    s/p RAI treatment  . 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 Tobacco abuse   . Urinary incontinence      Social History   Socioeconomic History  . Marital status: Single    Spouse name: Not on file  . Number of children: 2  . Years of education: Not on file  . Highest education level: Not on file  Occupational History  . Occupation: CNA    Comment: [place]  . Financial resource strain: Not on file  . Food insecurity:    Worry: Not on file   Inability: Not on file  . Transportation needs:    Medical: Not on file    Non-medical: Not on file  Tobacco Use  . Smoking status: Former Smoker    Packs/day: 0.50    Years: 48.00    Pack years: 24.00    Types: Cigarettes    Start date: 11/30/1968    Last attempt to quit: 10/31/2016    Years since quitting: 1.1  . Smokeless tobacco: Never Used  . Tobacco comment: did  not smoke more than .5 pack   Substance and Sexual Activity  . Alcohol use: No    Alcohol/week: 0.0 standard drinks    Comment: rarely   . Drug use: No  . Sexual activity: Yes  Lifestyle  . Physical activity:    Days per week: Not on file    Minutes per session: Not on file  . Stress: Not on file  Relationships  . Social connections:    Talks on phone: Not on file    Gets together: Not on file    Attends religious service: Not on file    Active member of club or organization: Not on file    Attends meetings of clubs or organizations: Not on file    Relationship status: Not on file  . Intimate partner violence:    Fear of current or ex  partner: Not on file    Emotionally abused: Not on file    Physically abused: Not on file    Forced sexual activity: Not on file  Other Topics Concern  . Not on file  Social History Narrative   Caregiver   Daughter and grandson in household    Past Surgical History:  Procedure Laterality Date  . REPAIR OF COMPLEX TRACTION RETINAL DETACHMENT    . TOTAL ABDOMINAL HYSTERECTOMY W/ BILATERAL SALPINGOOPHORECTOMY      Family History  Problem Relation Age of Onset  . Hypertension Mother   . Heart failure Mother   . Diabetes Mother   . Pancreatic cancer Father   . Colon cancer Maternal Aunt   . Cancer Sister   . Thyroid disease Paternal Aunt   . Cancer Paternal Aunt   . Thyroid disease Unknown        paternal side  . Cancer Maternal Grandfather   . Diabetes Paternal Grandmother   . Diabetes Maternal Uncle     PMHx, SurgHx, SocialHx, FamHx, Medications, and  Allergies were reviewed in the Visit Navigator and updated as appropriate.   Patient Active Problem List   Diagnosis Date Noted  . Primary osteoarthritis of right knee 01/04/2016  . Hypertension   . Hyperthyroidism     Social History   Tobacco Use  . Smoking status: Former Smoker    Packs/day: 0.50    Years: 48.00    Pack years: 24.00    Types: Cigarettes    Start date: 11/30/1968    Last attempt to quit: 10/31/2016    Years since quitting: 1.1  . Smokeless tobacco: Never Used  . Tobacco comment: did  not smoke more than .5 pack   Substance Use Topics  . Alcohol use: No    Alcohol/week: 0.0 standard drinks    Comment: rarely   . Drug use: No    Current Medications and Allergies:    Current Outpatient Medications:  .  ADULT ASPIRIN EC LOW STRENGTH PO, Take by mouth., Disp: , Rfl:  .  albuterol (PROVENTIL HFA;VENTOLIN HFA) 108 (90 Base) MCG/ACT inhaler, Inhale 2 puffs into the lungs every 6 (six) hours as needed for wheezing or shortness of breath., Disp: 1 Inhaler, Rfl: 0 .  amLODipine (NORVASC) 10 MG tablet, Take 1 tablet (10 mg total) by mouth daily., Disp: 90 tablet, Rfl: 0 .  latanoprost (XALATAN) 0.005 % ophthalmic solution, Place into both eyes daily. As directed, Disp: , Rfl:  .  lisinopril-hydrochlorothiazide (PRINZIDE,ZESTORETIC) 20-25 MG tablet, Take 1 tablet by mouth daily., Disp: 90 tablet, Rfl: 0 .  methimazole (TAPAZOLE) 10 MG tablet, Take 1 tablet (10 mg total) by mouth 3 (three) times daily., Disp: 90 tablet, Rfl: 0 .  metoprolol succinate (TOPROL-XL) 100 MG 24 hr tablet, Take 1 tablet (100 mg total) by mouth daily. Take with or immediately following a meal., Disp: 90 tablet, Rfl: 0   Allergies  Allergen Reactions  . Doxycycline Nausea Only  . Codeine Nausea And Vomiting  . Pollen Extract     Review of Systems   ROS  Negative unless otherwise specified per HPI.  Vitals:   Vitals:   12/10/17 0902  BP: 136/88  Pulse: 64  Temp: 97.9 F (36.6 C)    TempSrc: Oral  SpO2: 97%  Weight: 204 lb (92.5 kg)     Body mass index is 31.95 kg/m.   Physical Exam:    Physical Exam  Constitutional: She appears well-developed. She is cooperative.  Non-toxic appearance. She does not have a sickly appearance. She does not appear ill. No distress.  Cardiovascular: Normal rate, regular rhythm, S1 normal, S2 normal, normal heart sounds and normal pulses.  No LE edema  Pulmonary/Chest: Effort normal and breath sounds normal.  Abdominal: Normal appearance. There is no tenderness. There is no CVA tenderness.  Neurological: She is alert. GCS eye subscore is 4. GCS verbal subscore is 5. GCS motor subscore is 6.  Skin: Skin is warm, dry and intact.  Psychiatric: She has a normal mood and affect. Her speech is normal and behavior is normal.  Nursing note and vitals reviewed.    Assessment and Plan:    Jennifer was seen today for hypertension.  Diagnoses and all orders for this visit:  Essential hypertension Improving. Continue current regimen. Discussed need for compliance with endocrinology to maximize HTN control. Patient verbalized understanding. Follow-up in 6 months. -     Comprehensive metabolic panel  Urinary incontinence, unspecified type She would like a referral to urology. I will put this in at this time. I will check a UA with urine culture.  -     Urinalysis, Routine w reflex microscopic -     Urine Culture  . Reviewed expectations re: course of current medical issues. . Discussed self-management of symptoms. . Outlined signs and symptoms indicating need for more acute intervention. . Patient verbalized understanding and all questions were answered. . See orders for this visit as documented in the electronic medical record. . Patient received an After Visit Summary.   Inda Coke, PA-C [place], [facility] 12/10/2017  Follow-up: No follow-ups on file.

## 2017-12-11 LAB — URINE CULTURE
MICRO NUMBER: 91075402
Result:: NO GROWTH
SPECIMEN QUALITY: ADEQUATE

## 2017-12-27 ENCOUNTER — Telehealth: Payer: Self-pay | Admitting: Sports Medicine

## 2017-12-27 NOTE — Telephone Encounter (Signed)
See note  Copied from [place] [id number]. Topic: Quick Communication - See Telephone Encounter >> Dec 27, 2017  2:43 PM Bea Graff, NT wrote: CRM for notification. See Telephone encounter for: 12/27/17. Patient states she is leaving for [facility] and has to be at the airport by 2. She states her right knee is hurting very badly after her injection she had a few weeks with Dr. Paulla Fore and that when she is up on it the knee "gives out." Can pt be worked in with Dr. Paulla Fore tomorrow morning before she needs to leave?

## 2017-12-27 NOTE — Telephone Encounter (Signed)
Spoke with pt and scheduled OV for tomorrow at 8:20 AM.

## 2017-12-28 ENCOUNTER — Ambulatory Visit (INDEPENDENT_AMBULATORY_CARE_PROVIDER_SITE_OTHER): Payer: Medicare HMO | Admitting: Sports Medicine

## 2017-12-28 ENCOUNTER — Ambulatory Visit: Payer: Self-pay

## 2017-12-28 ENCOUNTER — Ambulatory Visit (INDEPENDENT_AMBULATORY_CARE_PROVIDER_SITE_OTHER): Payer: Medicare HMO

## 2017-12-28 VITALS — BP 136/84 | HR 77 | Ht 67.0 in | Wt 204.8 lb

## 2017-12-28 DIAGNOSIS — M1711 Unilateral primary osteoarthritis, right knee: Secondary | ICD-10-CM | POA: Diagnosis not present

## 2017-12-28 NOTE — Progress Notes (Signed)
Kari Coleman. Kari Coleman, [facility] [place]  [name] - 67 y.o. female MRN [id number]  Date of birth: [birth year]  Visit Date: 12/28/2017  PCP: Inda Coke, PA   Referred by: Inda Coke, [place]  Scribe(s) for today's visit: Josepha Pigg, CMA  SUBJECTIVE:  Kari Coleman is here for Follow-up (R knee)   11/23/2017: Her R knee pain symptoms INITIALLY: Began several years ago and she was dx with severe femorotibial compartment syndrome.  Described as moderate aching and stiffness, radiating to the anterior lower leg.  Worsened with stairs, sit to stand.  Improved with rest.  Additional associated symptoms include: She reports some swelling and stiffness around the knee. She has noticed clicking and popping in the knee. She feels like she can "hear the bone sometimes" when she gets up and down. At times she will experience shooting pains into the R leg.    At this time symptoms show no change compared to onset.  She has been taking Tylenol or IBU with no relief.   12/28/2017: Compared to the last office visit, her previously described symptoms show no change. She has noticed that the knee is giving out on her more often. She has also noticed a crunching sound. She denies any recent falls d/t knee giving out. She c/o stiffness in the knee. She denies visible swelling. Sx are worse when walking.  Current symptoms are moderate & are radiating to the anterior lower leg.  She has been resting and elevating her leg with some relief.    REVIEW OF SYSTEMS: Denies night time disturbances. Reports night sweats. Denies unexplained weight loss. Reports personal history of cervical cancer. Reports changes in bowel or bladder habits - worsening urinary incontinence. Denies recent unreported falls. Denies new or worsening dyspnea or wheezing. Denies headaches or dizziness.  Reports numbness, tingling or weakness in RLE.    Reports dizziness or presyncopal episodes Denies lower extremity edema   HISTORY:  Prior history reviewed and updated per electronic medical record.  Social History   Occupational History  . 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 Smoking status: Former Smoker    Packs/day: 0.50    Years: 48.00    Pack years: 24.00    Types: Cigarettes    Start date: 11/30/1968    Last attempt to quit: 10/31/2016    Years since quitting: 1.2  . Smokeless tobacco: Never Used  . Tobacco comment: did  not smoke more than .5 pack   Substance and Sexual Activity  . Alcohol use: No    Alcohol/week: 0.0 standard drinks    Comment: rarely   . Drug use: No  . Sexual activity: Yes   Social History   Social History Narrative   Caregiver   Daughter and grandson in household     DATA OBTAINED & REVIEWED:   Recent Labs    05/25/17 1504  HGBA1C 5.4   . Outside x-rays obtained creams were orthopedics reportedly told she has osteoarthritis. .   OBJECTIVE:  VS:  HT:5\' 7"  (170.2 cm)   WT:204 lb 12.8 oz (92.9 kg)  BMI:32.07    BP:136/84  HR:77bpm  TEMP: ( )  RESP:97 %   PHYSICAL EXAM: CONSTITUTIONAL: Well-developed, Well-nourished and In no acute distress PSYCHIATRIC: Alert & appropriately interactive. and Not depressed or anxious appearing. RESPIRATORY: No increased work of breathing and Trachea Midline EYES: Pupils are equal., EOM intact without  nystagmus. and No scleral icterus.  VASCULAR EXAM: Warm and well perfused NEURO: unremarkable  MSK Exam: Right knee  Osteoarthritic bossing with moderate effusion appreciated. No overlying skin changes. TTP over Medial and lateral joint lines.   RANGE OF MOTION & STRENGTH  Good flexion and extension.   SPECIALITY TESTING:  Ligamentously stable to varus and valgus straining.  Anterior posterior drawer is stable.  Pain with McMurray's and pain with patellar grind.     ASSESSMENT   1. Primary osteoarthritis of  right knee     PLAN:  Pertinent additional documentation may be included in corresponding procedure notes, imaging studies, problem based documentation and patient instructions.  Procedures:  . US Guided Injection per procedure note  Medications:  No orders of the defined types were placed in this encounter.  Discussion/Instructions: No problem-specific Assessment & Plan notes found for this encounter.  . Recurrent effusion.  She has fairly continues to scale down birth but with dysarthric.  Her daughter's wedding is this weekend and though we have recently injected we will go ahead and repeat the injection today but plan to get her set up with orthopedics for total knee arthroplasty. . Excuse note for helping with travel and wheelchair assistance provided today. . Discussed red flag symptoms that warrant earlier emergent evaluation and patient voices understanding. . Activity modifications and the importance of avoiding exacerbating activities (limiting pain to no more than a 4 / 10 during or following activity) recommended and discussed.  Follow-up:  . Return if symptoms worsen or fail to improve.  . If any lack of improvement consider: referral to Orthopedics for Total knee arthroplasty . At follow up will plan to consider: repeat corticosteroid injections     CMA/ATC served as scribe during this visit. History, Physical, and Plan performed by medical provider. Documentation and orders reviewed and attested to.      Gerda Diss, [facility] Physician

## 2017-12-28 NOTE — Patient Instructions (Signed)

## 2017-12-28 NOTE — Procedures (Signed)
PROCEDURE NOTE:  Ultrasound Guided: Aspiration and Injection: Right knee Images were obtained and interpreted by myself, Teresa Coombs, DO  Images have been saved and stored to PACS system. Images obtained on: GE S7 Ultrasound machine    ULTRASOUND FINDINGS:  Large effusion, generalized synovitis  DESCRIPTION OF PROCEDURE:  The patient's clinical condition is marked by substantial pain and/or significant functional disability. Other conservative therapy has not provided relief, is contraindicated, or not appropriate. There is a reasonable likelihood that injection will significantly improve the patient's pain and/or functional impairment.   After discussing the risks, benefits and expected outcomes of the injection and all questions were reviewed and answered, the patient wished to undergo the above named procedure.  Verbal consent was obtained.  The ultrasound was used to identify the target structure and adjacent neurovascular structures. The skin was then prepped in sterile fashion and the target structure was injected under direct visualization using sterile technique as below:  Single injection performed as below: PREP: Alcohol, Ethel Chloride and 5 cc 1% lidocaine on 25g 1.5 in. needle APPROACH:superiolateral, stopcock technique, 18g 1.5 in. INJECTATE: 2 cc 0.5% Marcaine and 2 cc 40mg /mL DepoMedrol ASPIRATE: 38mL  and straw colored  DRESSING: Band-Aid and 6-inch Ace Wrap  Post procedural instructions including recommending icing and warning signs for infection were reviewed.    This procedure was well tolerated and there were no complications.   IMPRESSION: Succesful Ultrasound Guided: Aspiration and Injection

## 2018-01-04 ENCOUNTER — Ambulatory Visit (INDEPENDENT_AMBULATORY_CARE_PROVIDER_SITE_OTHER): Payer: Self-pay | Admitting: Orthopaedic Surgery

## 2018-01-04 ENCOUNTER — Telehealth: Payer: Self-pay | Admitting: *Deleted

## 2018-01-04 NOTE — Telephone Encounter (Signed)
Referral Request Placed in Scheduling Box. There were no notes attached.  [facility]  P# [phone] F# [phone]

## 2018-01-17 ENCOUNTER — Encounter (INDEPENDENT_AMBULATORY_CARE_PROVIDER_SITE_OTHER): Payer: Self-pay | Admitting: Orthopaedic Surgery

## 2018-01-17 ENCOUNTER — Ambulatory Visit (INDEPENDENT_AMBULATORY_CARE_PROVIDER_SITE_OTHER): Payer: Medicare HMO | Admitting: Orthopaedic Surgery

## 2018-01-17 DIAGNOSIS — M1711 Unilateral primary osteoarthritis, right knee: Secondary | ICD-10-CM

## 2018-01-17 NOTE — Progress Notes (Signed)
Office Visit Note   Patient: Kari Coleman           Date of Birth: 09/20/1950           MRN: [id number] Visit Date: 01/17/2018              Requested by: Inda Coke, [place] [address] [place], [place] [address] PCP: Inda Coke, [place]   Assessment & Plan: Visit Diagnoses:  1. Primary osteoarthritis of right knee     Plan: Impression is end-stage tricompartmental degenerative joint disease of the right knee.  After full discussion of treatment options she elects to proceed with a right total knee replacement in the near future.  She is now significantly limited due to the right knee pain and arthritis and she has had minimal relief from previous conservative treatments.  She will give [place] a call when she is ready to schedule.  Risks and benefits and rehab and recovery were discussed today.  Follow-Up Instructions: Return if symptoms worsen or fail to improve.   Orders:  No orders of the defined types were placed in this encounter.  No orders of the defined types were placed in this encounter.     Procedures: No procedures performed   Clinical Data: No additional findings.   Subjective: Chief Complaint  Patient presents with  . Right Knee - Pain    Kari Coleman is a very pleasant 67 year old female who comes in with chronic right knee pain.  She comes in at the referral of Dr. Paulla Fore.  She had a right knee aspiration and injection about 3 weeks ago which has helped some but she still complains of a persistent effusion.  The pain is worse with activity and prolonged standing.  She takes ibuprofen as needed.  She has constant pain which limits her with ADLs and with sleeping.  Denies any numbness and tingling.  She does endorse giving way of the knee and near falls.   Review of Systems  Constitutional: Negative.   HENT: Negative.   Eyes: Negative.   Respiratory: Negative.   Cardiovascular: Negative.   Endocrine: Negative.   Musculoskeletal: Negative.     Neurological: Negative.   Hematological: Negative.   Psychiatric/Behavioral: Negative.   All other systems reviewed and are negative.    Objective: Vital Signs: There were no vitals taken for this visit.  Physical Exam  Constitutional: She is oriented to person, place, and time. She appears well-developed and well-nourished.  HENT:  Head: Normocephalic and atraumatic.  Eyes: EOM are normal.  Neck: Neck supple.  Pulmonary/Chest: Effort normal.  Abdominal: Soft.  Neurological: She is alert and oriented to person, place, and time.  Skin: Skin is warm. Capillary refill takes less than 2 seconds.  Psychiatric: She has a normal mood and affect. Her behavior is normal. Judgment and thought content normal.  Nursing note and vitals reviewed.   Ortho Exam Right knee exam shows a small joint effusion.  Mild varus alignment.  Stable varus valgus.  Collaterals and cruciates are stable. Specialty Comments:  No specialty comments available.  Imaging: No results found.   PMFS History: Patient Active Problem List   Diagnosis Date Noted  . Primary osteoarthritis of right knee 01/04/2016  . Hypertension   . Hyperthyroidism    Past Medical History:  Diagnosis Date  . Cervical cancer ([place])   . Glaucoma 2017  . History of echocardiogram    Echo 9/16:  Mild LVH, EF 55-60%, no RWMA, Gr 1 DD, mild MR //  Echo 1/17:  Mild LVH, EF 50-55%, Gr 1 DD, mild to mod MR, mild LAE  . Hypertension   . Hyperthyroidism    s/p RAI treatment  . 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 Tobacco abuse   . Urinary incontinence     Family History  Problem Relation Age of Onset  . Hypertension Mother   . Heart failure Mother   . Diabetes Mother   . Pancreatic cancer Father   . Colon cancer Maternal Aunt   . Cancer Sister   . Thyroid disease Paternal Aunt   . Cancer Paternal Aunt   . Thyroid disease Unknown        paternal side  . Cancer Maternal Grandfather   . Diabetes Paternal  Grandmother   . Diabetes Maternal Uncle     Past Surgical History:  Procedure Laterality Date  . REPAIR OF COMPLEX TRACTION RETINAL DETACHMENT    . TOTAL ABDOMINAL HYSTERECTOMY W/ BILATERAL SALPINGOOPHORECTOMY     Social History   Occupational History  . Occupation: CNA    Comment: [employer]  Tobacco Use  . Smoking status: Former Smoker    Packs/day: 0.50    Years: 48.00    Pack years: 24.00    Types: Cigarettes    Start date: 11/30/1968    Last attempt to quit: 10/31/2016    Years since quitting: 1.2  . Smokeless tobacco: Never Used  . Tobacco comment: did  not smoke more than .5 pack   Substance and Sexual Activity  . Alcohol use: No    Alcohol/week: 0.0 standard drinks    Comment: rarely   . Drug use: No  . Sexual activity: Yes

## 2018-01-25 DIAGNOSIS — R351 Nocturia: Secondary | ICD-10-CM | POA: Diagnosis not present

## 2018-01-25 DIAGNOSIS — N3946 Mixed incontinence: Secondary | ICD-10-CM | POA: Diagnosis not present

## 2018-01-25 DIAGNOSIS — N3944 Nocturnal enuresis: Secondary | ICD-10-CM | POA: Diagnosis not present

## 2018-01-25 DIAGNOSIS — R350 Frequency of micturition: Secondary | ICD-10-CM | POA: Diagnosis not present

## 2018-02-10 ENCOUNTER — Encounter: Payer: Self-pay | Admitting: Sports Medicine

## 2018-02-15 ENCOUNTER — Ambulatory Visit: Payer: Medicare HMO | Admitting: Physician Assistant

## 2018-02-15 ENCOUNTER — Encounter: Payer: Self-pay | Admitting: Sports Medicine

## 2018-02-15 ENCOUNTER — Ambulatory Visit (INDEPENDENT_AMBULATORY_CARE_PROVIDER_SITE_OTHER): Payer: Medicare HMO | Admitting: Sports Medicine

## 2018-02-15 VITALS — BP 150/90 | HR 67 | Ht 67.0 in | Wt 204.2 lb

## 2018-02-15 DIAGNOSIS — N3944 Nocturnal enuresis: Secondary | ICD-10-CM | POA: Diagnosis not present

## 2018-02-15 DIAGNOSIS — M25461 Effusion, right knee: Secondary | ICD-10-CM | POA: Diagnosis not present

## 2018-02-15 DIAGNOSIS — N3946 Mixed incontinence: Secondary | ICD-10-CM | POA: Diagnosis not present

## 2018-02-15 DIAGNOSIS — R350 Frequency of micturition: Secondary | ICD-10-CM | POA: Diagnosis not present

## 2018-02-15 MED ORDER — DICLOFENAC SODIUM 1 % TD GEL: TRANSDERMAL | 1 refills | Status: DC

## 2018-02-15 NOTE — Progress Notes (Signed)
[name]. Rigby, [place] at [place]  Kari Coleman - 67 y.o. female MRN [id number]  Date of birth: 03/12/1951  Visit Date: 02/15/2018  PCP: Inda Coke, PA   Referred by: Inda Coke, [place]  Scribe(s) for today's visit: Josepha Pigg, CMA  SUBJECTIVE:  Kari Coleman is here for Follow-up (R knee)   11/23/2017: Her R knee pain symptoms INITIALLY: Began several years ago and she was dx with severe femorotibial compartment syndrome.  Described as moderate aching and stiffness, radiating to the anterior lower leg.  Worsened with stairs, sit to stand.  Improved with rest.  Additional associated symptoms include: She reports some swelling and stiffness around the knee. She has noticed clicking and popping in the knee. She feels like she can "hear the bone sometimes" when she gets up and down. At times she will experience shooting pains into the R leg.    At this time symptoms show no change compared to onset.  She has been taking Tylenol or IBU with no relief.   12/28/2017: Compared to the last office visit, her previously described symptoms show no change. She has noticed that the knee is giving out on her more often. She has also noticed a crunching sound. She denies any recent falls d/t knee giving out. She c/o stiffness in the knee. She denies visible swelling. Sx are worse when walking.  Current symptoms are moderate & are radiating to the anterior lower leg.  She has been resting and elevating her leg with some relief.   02/15/2018: Compared to the last office visit, her previously described symptoms are improving. The pain is not that bad, it comes and goes. She has noticed some swelling around the knee, also stiffness. More pain when fluid builds up.  At times she will feel a little unsteady on her feet.  Current symptoms are mild & are nonradiating She has been resting and elevated her knee. She will use ACE  wrap is she is going to be doing more walking throughout the day.  She received steroid injection 12/28/2017 and responded well. She saw Dr. Erlinda Hong 01/2018 and discussed TKR, she plans to schedule this in 03/2018.    REVIEW OF SYSTEMS: Denies night time disturbances. Reports night sweats. Denies unexplained weight loss. Reports personal history of cervical cancer. Reports changes in bowel or bladder habits - worsening urinary incontinence, seeing urologist today. Denies recent unreported falls. Denies new or worsening dyspnea or wheezing. Denies headaches or dizziness.  Denies numbness, tingling or weakness in RLE.  Denies dizziness or presyncopal episodes Reports lower extremity edema   HISTORY:  Prior history reviewed and updated per electronic medical record.  Social History   Occupational History  . Occupation: CNA    Comment: [employer]  Tobacco Use  . Smoking status: Former Smoker    Packs/day: 0.50    Years: 48.00    Pack years: 24.00    Types: Cigarettes    Start date: 11/30/1968    Last attempt to quit: 10/31/2016    Years since quitting: 1.2  . Smokeless tobacco: Never Used  . Tobacco comment: did  not smoke more than .5 pack   Substance and Sexual Activity  . Alcohol use: No    Alcohol/week: 0.0 standard drinks    Comment: rarely   . Drug use: No  . Sexual activity: Yes   Social History   Social History Narrative   Caregiver  Daughter and grandson in household    DATA OBTAINED & REVIEWED:   Recent Labs    05/25/17 1504  HGBA1C 5.4   . Outside x-rays obtained at [facility] reportedly told she has osteoarthritis.  OBJECTIVE:  VS:  HT:5\' 7"  (170.2 cm)   WT:204 lb 3.2 oz (92.6 kg)  BMI:31.97    BP:(!) 150/90  HR:67bpm  TEMP: ( )  RESP:97 %   PHYSICAL EXAM: CONSTITUTIONAL: Well-developed, Well-nourished and In no acute distress PSYCHIATRIC: Alert & appropriately interactive. and Not depressed or anxious appearing. RESPIRATORY: No  increased work of breathing and Trachea Midline EYES: Pupils are equal., EOM intact without nystagmus. and No scleral icterus.  VASCULAR EXAM: Warm and well perfused NEURO: unremarkable  MSK Exam: Right knee  Osteoarthritic bossing with moderate effusion appreciated. No overlying skin changes. TTP over Medial and lateral joint lines.   RANGE OF MOTION & STRENGTH  Good flexion and extension.   SPECIALITY TESTING:  Ligamentously stable to varus and valgus straining.  Anterior posterior drawer is stable.  Pain with McMurray's and pain with patellar grind.     ASSESSMENT   1. Effusion of right knee     PLAN:  Pertinent additional documentation may be included in corresponding procedure notes, imaging studies, problem based documentation and patient instructions.  Procedures:  . None  Medications:  Meds ordered this encounter  Medications  . diclofenac sodium (VOLTAREN) 1 % GEL    Sig: Apply topically to affected area qid    Dispense:  100 g    Refill:  1   Discussion/Instructions: No problem-specific Assessment & Plan notes found for this encounter.  Marland Kitchen Ultimately she is interested in undergoing total knee arthroplasty next several weeks and get discussed that she should not have an injection within 6 weeks of total knee arthroplasty given the increased risk of infection.  She was understanding.  She will continue with compression, icing and topical Voltaren gel provided. .   . Discussed red flag symptoms that warrant earlier emergent evaluation and patient voices understanding. . Activity modifications and the importance of avoiding exacerbating activities (limiting pain to no more than a 4 / 10 during or following activity) recommended and discussed.  Follow-up:  . Return if symptoms worsen or fail to improve.      CMA/ATC served as Education administrator during this visit. History, Physical, and Plan performed by medical provider. Documentation and orders reviewed and attested  to.      Gerda Diss, [place] [facility] Physician

## 2018-02-21 ENCOUNTER — Ambulatory Visit: Payer: Medicare HMO

## 2018-02-27 DIAGNOSIS — R351 Nocturia: Secondary | ICD-10-CM | POA: Diagnosis not present

## 2018-02-27 DIAGNOSIS — N3946 Mixed incontinence: Secondary | ICD-10-CM | POA: Diagnosis not present

## 2018-03-15 ENCOUNTER — Ambulatory Visit: Payer: Medicare HMO | Admitting: Cardiology

## 2018-03-25 NOTE — Progress Notes (Deleted)
Cardiology Office Note:    Date:  03/25/2018   ID:  Kari Coleman, DOB [birth year], MRN [id number]  PCP:  Inda Coke, PA  Cardiologist:  Shirlee More, MD   Referring MD: Inda Coke, [place]  ASSESSMENT:    No diagnosis found. PLAN:    In order of problems listed above:  1. ***  Next appointment   Medication Adjustments/Labs and Tests Ordered: Current medicines are reviewed at length with the patient today.  Concerns regarding medicines are outlined above.  No orders of the defined types were placed in this encounter.  No orders of the defined types were placed in this encounter.    No chief complaint on file. ***  History of Present Illness:    Kari Coleman is a 67 y.o. female with a history of hypertension and left ventricular hypertrophy who is being seen today for the evaluation of chest pain at the request of Inda Coke, [place].   Past Medical History:  Diagnosis Date  . Cervical cancer ([place])   . Glaucoma 2017  . History of echocardiogram    Echo 9/16:  Mild LVH, EF 55-60%, no RWMA, Gr 1 DD, mild MR // Echo 1/17:  Mild LVH, EF 50-55%, Gr 1 DD, mild to mod MR, mild LAE  . Hypertension   . Hyperthyroidism    s/p RAI treatment  . 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 Tobacco abuse   . Urinary incontinence     Past Surgical History:  Procedure Laterality Date  . REPAIR OF COMPLEX TRACTION RETINAL DETACHMENT    . TOTAL ABDOMINAL HYSTERECTOMY W/ BILATERAL SALPINGOOPHORECTOMY      Current Medications: No outpatient medications have been marked as taking for the 03/28/18 encounter (Appointment) with Richardo Priest, MD.     Allergies:   Doxycycline; Codeine; and Pollen extract   Social History   Socioeconomic History  . Marital status: Single    Spouse name: Not on file  . Number of children: 2  . Years of education: Not on file  . Highest education level: Not on file  Occupational History  . Occupation: CNA    Comment: [employer]  . Financial resource strain: Not on file  . Food insecurity:    Worry: Not on file    Inability: Not on file  . Transportation needs:    Medical: Not on file    Non-medical: Not on file  Tobacco Use  . Smoking status: Former Smoker    Packs/day: 0.50    Years: 48.00    Pack years: 24.00    Types: Cigarettes    Start date: 11/30/1968    Last attempt to quit: 10/31/2016    Years since quitting: 1.3  . Smokeless tobacco: Never Used  . Tobacco comment: did  not smoke more than .5 pack   Substance and Sexual Activity  . Alcohol use: No    Alcohol/week: 0.0 standard drinks    Comment: rarely   . Drug use: No  . Sexual activity: Yes  Lifestyle  . Physical activity:    Days per week: Not on file    Minutes per session: Not on file  . Stress: Not on file  Relationships  . Social connections:    Talks on phone: Not on file    Gets together: Not on file    Attends religious service: Not on file    Active member of club or organization: Not  on file    Attends meetings of clubs or organizations: Not on file    Relationship status: Not on file  Other Topics Concern  . Not on file  Social History Narrative   Caregiver   Daughter and grandson in household     Family History: The patient's ***family history includes Cancer in her maternal grandfather, paternal aunt, and sister; Colon cancer in her maternal aunt; Diabetes in her maternal uncle, mother, and paternal grandmother; Heart failure in her mother; Hypertension in her mother; Pancreatic cancer in her father; Thyroid disease in her paternal aunt and unknown relative.  ROS:   ROS Please see the history of present illness.    *** All other systems reviewed and are negative.  EKGs/Labs/Other Studies Reviewed:    The following studies were reviewed today: ***  EKG:  EKG is *** ordered today.  The ekg ordered today demonstrates ***  Recent Labs: 11/08/2017: Hemoglobin 13.6; Platelets 295.0; TSH  <0.01 12/10/2017: ALT 19; BUN 18; Creatinine, Ser 1.04; Potassium 4.1; Sodium 139  Recent Lipid Panel No results found for: CHOL, TRIG, HDL, CHOLHDL, VLDL, LDLCALC, LDLDIRECT  Physical Exam:    VS:  There were no vitals taken for this visit.    Wt Readings from Last 3 Encounters:  02/15/18 204 lb 3.2 oz (92.6 kg)  12/28/17 204 lb 12.8 oz (92.9 kg)  12/10/17 204 lb (92.5 kg)     GEN: *** Well nourished, well developed in no acute distress HEENT: Normal NECK: No JVD; No carotid bruits LYMPHATICS: No lymphadenopathy CARDIAC: ***RRR, no murmurs, rubs, gallops RESPIRATORY:  Clear to auscultation without rales, wheezing or rhonchi  ABDOMEN: Soft, non-tender, non-distended MUSCULOSKELETAL:  No edema; No deformity  SKIN: Warm and dry NEUROLOGIC:  Alert and oriented x 3 PSYCHIATRIC:  Normal affect     Signed, Shirlee More, MD  03/25/2018 8:52 AM    [place]

## 2018-03-28 ENCOUNTER — Ambulatory Visit: Payer: Medicare HMO | Admitting: Cardiology

## 2018-04-02 DIAGNOSIS — H2513 Age-related nuclear cataract, bilateral: Secondary | ICD-10-CM | POA: Diagnosis not present

## 2018-04-02 DIAGNOSIS — H401131 Primary open-angle glaucoma, bilateral, mild stage: Secondary | ICD-10-CM | POA: Diagnosis not present

## 2018-04-05 ENCOUNTER — Encounter: Payer: Self-pay | Admitting: Sports Medicine

## 2018-04-05 ENCOUNTER — Ambulatory Visit (INDEPENDENT_AMBULATORY_CARE_PROVIDER_SITE_OTHER): Payer: Medicare HMO | Admitting: Sports Medicine

## 2018-04-05 ENCOUNTER — Ambulatory Visit: Payer: Self-pay

## 2018-04-05 VITALS — BP 120/82 | HR 84 | Ht 67.0 in | Wt 199.6 lb

## 2018-04-05 DIAGNOSIS — M25461 Effusion, right knee: Secondary | ICD-10-CM

## 2018-04-05 DIAGNOSIS — M1711 Unilateral primary osteoarthritis, right knee: Secondary | ICD-10-CM | POA: Diagnosis not present

## 2018-04-05 NOTE — Progress Notes (Signed)
[name]. Rigby, [place] at [facility] [phone]  JOSIE BURLEIGH - 68 y.o. female MRN [id number]  Date of birth: Mar 20, 1951  Visit Date:   PCP: Inda Coke, PA   Referred by: Inda Coke, [place]   SUBJECTIVE:  Chief Complaint  Patient presents with  . f/u R knee    Received steroid inj 12/28/17. Has tried Voltaren gel and ACE wrap.     HPI: Patient presents with persistent ongoing right greater than left knee pain.  Significant exacerbation yesterday to the point she is unable to bear weight.  She is limping today.  REVIEW OF SYSTEMS: She denies any significant nighttime disturbances she does have a blood since unchanged.  She has a history of urinary incontinence this is unchanged.  She is having weakness in her right leg as well as some right lower extremity swelling.  Symptoms have been significantly worsened in the past 1 to 2 days.  HISTORY:  Prior history reviewed and updated per electronic medical record.  Social History   Occupational History  . Occupation: CNA    Comment: [employer]  Tobacco Use  . Smoking status: Former Smoker    Packs/day: 0.50    Years: 48.00    Pack years: 24.00    Types: Cigarettes    Start date: 11/30/1968    Last attempt to quit: 10/31/2016    Years since quitting: 1.4  . Smokeless tobacco: Never Used  . Tobacco comment: did  not smoke more than .5 pack   Substance and Sexual Activity  . Alcohol use: No    Alcohol/week: 0.0 standard drinks    Comment: rarely   . Drug use: No  . Sexual activity: Yes   Social History   Social History Narrative   Caregiver   Daughter and grandson in household      DATA OBTAINED & REVIEWED:  Recent Labs    05/25/17 1504 06/11/17 1220 11/08/17 0951 12/10/17 0924  HGBA1C 5.4  --   --   --   CALCIUM 9.9  --  10.6* 10.3  AST  --   --  17 14  ALT  --   --  21 19  TSH  --  <0.01 Repeated and verified X2.* <0.01*  --    No problems  updated. No specialty comments available.  OBJECTIVE:  VS:  HT:5\' 7"  (170.2 cm)   WT:199 lb 9.6 oz (90.5 kg)  BMI:31.25    BP:120/82  HR:84bpm  TEMP: ( )  RESP:95 %   PHYSICAL EXAM: Adult female.  No acute distress.  Her right knee has marked effusion.  She has generalized osteoarthritic bossing bilaterally.  Normal extensor mechanism.  Stable to varus and valgus strain..   ASSESSMENT  1. Effusion of right knee   2. Primary osteoarthritis of right knee     PLAN:  Pertinent additional documentation may be included in corresponding procedure notes, imaging studies, problem based documentation and patient instructions.  Procedures:  US Guided Injection per procedure note  Medications:  No orders of the defined types were placed in this encounter.   Discussion/Instructions: No problem-specific Assessment & Plan notes found for this encounter.   She is doing significantly worse today and we will go ahead and repeat an aspiration and injection with the understanding that terminal treatment for this is a total knee arthroplasty.  She is interested in having this performed at the end of February which is greater than  6 weeks from now and I encouraged her to go ahead and schedule this with Dr. Erlinda Hong at her convenience.  We will defer follow-up to him at this time.   Return if symptoms worsen or fail to improve.          Gerda Diss, [place] [facility] Physician

## 2018-04-05 NOTE — Patient Instructions (Signed)

## 2018-04-05 NOTE — Procedures (Signed)
PROCEDURE NOTE:  Ultrasound Guided: Aspiration and Injection: Right knee, Intra-articular Images were obtained and interpreted by myself, Teresa Coombs, DO  Images have been saved and stored to PACS system. Images obtained on: GE S7 Ultrasound machine    ULTRASOUND FINDINGS:  Large effusion with marked generalized synovitis  DESCRIPTION OF PROCEDURE:  The patient's clinical condition is marked by substantial pain and/or significant functional disability. Other conservative therapy has not provided relief, is contraindicated, or not appropriate. There is a reasonable likelihood that injection will significantly improve the patient's pain and/or functional impairment.   After discussing the risks, benefits and expected outcomes of the injection and all questions were reviewed and answered, the patient wished to undergo the above named procedure.  Verbal consent was obtained.  The ultrasound was used to identify the target structure and adjacent neurovascular structures. The skin was then prepped in sterile fashion and the target structure was injected under direct visualization using sterile technique as below:  Single injection performed as below: PREP: Alcohol, Ethel Chloride and 5 cc 1% lidocaine on 25g 1.5 in. needle APPROACH:superiolateral, stopcock technique, 21g 2 in. INJECTATE: 2 cc 0.5% Marcaine and 2 cc 40mg /mL DepoMedrol ASPIRATE: 55 cc  and clear and straw colored  DRESSING: Band-Aid and 6-inch Ace Wrap  Post procedural instructions including recommending icing and warning signs for infection were reviewed.    This procedure was well tolerated and there were no complications.   IMPRESSION: Succesful Ultrasound Guided: Aspiration and Injection

## 2018-04-14 ENCOUNTER — Encounter: Payer: Self-pay | Admitting: Sports Medicine

## 2018-04-25 ENCOUNTER — Encounter: Payer: Self-pay | Admitting: Cardiology

## 2018-04-25 ENCOUNTER — Ambulatory Visit (INDEPENDENT_AMBULATORY_CARE_PROVIDER_SITE_OTHER): Payer: Medicare HMO | Admitting: Cardiology

## 2018-04-25 DIAGNOSIS — I10 Essential (primary) hypertension: Secondary | ICD-10-CM | POA: Diagnosis not present

## 2018-04-25 DIAGNOSIS — Z87891 Personal history of nicotine dependence: Secondary | ICD-10-CM | POA: Diagnosis not present

## 2018-04-25 DIAGNOSIS — R0789 Other chest pain: Secondary | ICD-10-CM | POA: Diagnosis not present

## 2018-04-25 NOTE — Patient Instructions (Signed)
Medication Instructions:  Your physician recommends that you continue on your current medications as directed. Please refer to the Current Medication list given to you today.  If you need a refill on your cardiac medications before your next appointment, please call your pharmacy.   Lab work: Your physician recommends that you return for lab work today: bmp, tsh  If you have labs (blood work) drawn today and your tests are completely normal, you will receive your results only by: Marland Kitchen MyChart Message (if you have MyChart) OR . A paper copy in the mail If you have any lab test that is abnormal or we need to change your treatment, we will call you to review the results.  Testing/Procedures: Your physician has recommended that you wear a holter monitor. Holter monitors are medical devices that record the heart's electrical activity. Doctors most often use these monitors to diagnose arrhythmias. Arrhythmias are problems with the speed or rhythm of the heartbeat. The monitor is a small, portable device. You can wear one while you do your normal daily activities. This is usually used to diagnose what is causing palpitations/syncope (passing out). Wear for 48 hours.   Your physician has requested that you have a lexiscan myoview. For further information please visit [email or web address]. Please follow instruction sheet, as given.    Follow-Up: At [facility], you and your health needs are our priority.  As part of our continuing mission to provide you with exceptional heart care, we have created designated Provider Care Teams.  These Care Teams include your primary Cardiologist (physician) and Advanced Practice Providers (APPs -  Physician Assistants and Nurse Practitioners) who all work together to provide you with the care you need, when you need it. You will need a follow up appointment in 6 months.  Please call our office 2 months in advance to schedule this appointment.  You may see No primary care  provider on file. or another member of our [employer] in [place]: Jenne Campus, MD . Shirlee More, MD  Any Other Special Instructions Will Be Listed Below (If Applicable).   Ambulatory Cardiac Monitoring An ambulatory cardiac monitor is a small recording device that is used to detect abnormal heart rhythms (arrhythmias). Most monitors are connected by wires to flat, sticky disks (electrodes) that are then attached to your chest. You may need to wear a monitor if you have had symptoms such as:  Fast heartbeats (palpitations).  Dizziness.  Fainting or light-headedness.  Unexplained weakness.  Shortness of breath. There are several types of monitors. Some common monitors include:  Holter monitor. This records your heart rhythm continuously, usually for 24-48 hours.  Event (episodic) monitor. This monitor has a symptoms button, and when pushed, it will begin recording. You need to activate this monitor to record when you have a heart-related symptom.  Automatic detection monitor. This monitor will begin recording when it detects an abnormal heartbeat. What are the risks? Generally, these devices are safe to use. However, it is possible that the skin under the electrodes will become irritated. How to prepare for monitoring Your health care provider will prepare your chest for the electrode placement and show you how to use the monitor.  Do not apply lotions to your chest before monitoring.  Follow directions on how to care for the monitor, and how to return the monitor when the testing period is complete. How to use your cardiac monitor  Follow directions about how long to wear the monitor, and if you  can take the monitor off in order to shower or bathe. ? Do not let the monitor get wet. ? Do not bathe, swim, or use a hot tub while wearing the monitor.  Keep your skin clean. Do not put body lotion or moisturizer on your chest.  Change the electrodes as  told by your health care provider, or any time they stop sticking to your skin. You may need to use medical tape to keep them on.  Try to put the electrodes in slightly different places on your chest to help prevent skin irritation. Follow directions from your health care provider about where to place the electrodes.  Make sure the monitor is safely clipped to your clothing or in a location close to your body as recommended by your health care provider.  If your monitor has a symptoms button, press the button to mark an event as soon as you feel a heart-related symptom, such as: ? Dizziness. ? Weakness. ? Light-headedness. ? Palpitations. ? Thumping or pounding in your chest. ? Shortness of breath. ? Unexplained weakness.  Keep a diary of your activities, such as walking, doing chores, and taking medicine. It is very important to note what you were doing when you pushed the button to record your symptoms. This will help your health care provider determine what might be contributing to your symptoms.  Send the recorded information as recommended by your health care provider. It may take some time for your health care provider to process the results.  Change the batteries as told by your health care provider.  Keep electronic devices away from your monitor. These include: ? Tablets. ? MP3 players. ? Cell phones.  While wearing your monitor you should avoid: ? Electric blankets. ? Armed forces operational officer. ? Electric toothbrushes. ? Microwave ovens. ? Magnets. ? Metal detectors. Get help right away if:  You have chest pain.  You have shortness of breath or extreme difficulty breathing.  You develop a very fast heartbeat that does not get better.  You develop dizziness that does not go away.  You faint or constantly feel like you are about to faint. Summary  An ambulatory cardiac monitor is a small recording device that is used to detect abnormal heart rhythms (arrhythmias).  Make  sure you understand how to send the information from the monitor to your health care provider.  It is important to press the button on the monitor when you have any heart-related symptoms.  Keep a diary of your activities, such as walking, doing chores, and taking medicine. It is very important to note what you were doing when you pushed the button to record your symptoms. This will help your health care provider learn what might be causing your symptoms. This information is not intended to replace advice given to you by your health care provider. Make sure you discuss any questions you have with your health care provider. Document Released: 12/28/2007 Document Revised: 01/03/2017 Document Reviewed: 03/04/2016 Elsevier Interactive Patient Education  2019 [employer].    Cardiac Nuclear Scan A cardiac nuclear scan is a test that measures blood flow to the heart when a person is resting and when he or she is exercising. The test looks for problems such as:  Not enough blood reaching a portion of the heart.  The heart muscle not working normally. You may need this test if:  You have heart disease.  You have had abnormal lab results.  You have had heart surgery or a balloon procedure  to open up blocked arteries (angioplasty).  You have chest pain.  You have shortness of breath. In this test, a radioactive dye (tracer) is injected into your bloodstream. After the tracer has traveled to your heart, an imaging device is used to measure how much of the tracer is absorbed by or distributed to various areas of your heart. This procedure is usually done at a hospital and takes 2-4 hours. Tell a health care provider about:  Any allergies you have.  All medicines you are taking, including vitamins, herbs, eye drops, creams, and over-the-counter medicines.  Any problems you or family members have had with anesthetic medicines.  Any blood disorders you have.  Any surgeries you have  had.  Any medical conditions you have.  Whether you are pregnant or may be pregnant. What are the risks? Generally, this is a safe procedure. However, problems may occur, including:  Serious chest pain and heart attack. This is only a risk if the stress portion of the test is done.  Rapid heartbeat.  Sensation of warmth in your chest. This usually passes quickly.  Allergic reaction to the tracer. What happens before the procedure?  Ask your health care provider about changing or stopping your regular medicines. This is especially important if you are taking diabetes medicines or blood thinners.  Follow instructions from your health care provider about eating or drinking restrictions.  Remove your jewelry on the day of the procedure. What happens during the procedure?  An IV will be inserted into one of your veins.  Your health care provider will inject a small amount of radioactive tracer through the IV.  You will wait for 20-40 minutes while the tracer travels through your bloodstream.  Your heart activity will be monitored with an electrocardiogram (ECG).  You will lie down on an exam table.  Images of your heart will be taken for about 15-20 minutes.  You may also have a stress test. For this test, one of the following may be done: ? You will exercise on a treadmill or stationary bike. While you exercise, your heart's activity will be monitored with an ECG, and your blood pressure will be checked. ? You will be given medicines that will increase blood flow to parts of your heart. This is done if you are unable to exercise.  When blood flow to your heart has peaked, a tracer will again be injected through the IV.  After 20-40 minutes, you will get back on the exam table and have more images taken of your heart.  Depending on the type of tracer used, scans may need to be repeated 3-4 hours later.  Your IV line will be removed when the procedure is over. The procedure  may vary among health care providers and hospitals. What happens after the procedure?  Unless your health care provider tells you otherwise, you may return to your normal schedule, including diet, activities, and medicines.  Unless your health care provider tells you otherwise, you may increase your fluid intake. This will help to flush the contrast dye from your body. Drink enough fluid to keep your urine pale yellow.  Ask your health care provider, or the department that is doing the test: ? When will my results be ready? ? How will I get my results? Summary  A cardiac nuclear scan measures the blood flow to the heart when a person is resting and when he or she is exercising.  Tell your health care provider if you are pregnant.  Before the procedure, ask your health care provider about changing or stopping your regular medicines. This is especially important if you are taking diabetes medicines or blood thinners.  After the procedure, unless your health care provider tells you otherwise, increase your fluid intake. This will help flush the contrast dye from your body.  After the procedure, unless your health care provider tells you otherwise, you may return to your normal schedule, including diet, activities, and medicines. This information is not intended to replace advice given to you by your health care provider. Make sure you discuss any questions you have with your health care provider. Document Released: 04/14/2004 Document Revised: 09/03/2017 Document Reviewed: 09/03/2017 Elsevier Interactive Patient Education  2019 [employer].

## 2018-04-25 NOTE — Addendum Note (Signed)
Addended by: Ashok Norris on: 04/25/2018 09:57 AM   Modules accepted: Orders

## 2018-04-25 NOTE — Progress Notes (Signed)
Cardiology Office Note:    Date:  04/25/2018   ID:  Kari Coleman, DOB Dec 15, 1950, MRN [id number]  PCP:  Inda Coke, PA  Cardiologist:  Jenean Lindau, MD   Referring MD: Inda Coke, PA    ASSESSMENT:    1. Chest discomfort   2. Essential hypertension   3. Ex-smoker    PLAN:    In order of problems listed above:  1. Primary prevention stressed with the patient.  Importance of compliance with diet and medication stressed and she vocalized understanding.  I believe she has an element of whitecoat hypertension and I told her to keep a track of her blood pressures at home.  These are followed meticulously by her primary care physician for the lipids. 2. I told her never to go back to smoking.  She is quit several months ago. 3. In view of her symptoms of chest discomfort we will schedule her for a Lexiscan sestamibi 4. She has palpitations and I would definitely like to get her TSH checked.  She will undergo 48-hour Holter monitor. 5. Patient will be seen in follow-up appointment in 6 months or earlier if the patient has any concerns.  She knows to go to the nearest emergency room for any concerning symptoms.   Medication Adjustments/Labs and Tests Ordered: Current medicines are reviewed at length with the patient today.  Concerns regarding medicines are outlined above.  No orders of the defined types were placed in this encounter.  No orders of the defined types were placed in this encounter.    History of Present Illness:    Kari Coleman is a 68 y.o. female who is being seen today for the evaluation of chest discomfort and palpitations at the request of Inda Coke, [place].  Patient is a pleasant 68 year old female.  She has past medical history of essential hypertension.  She mentions to me that she has had thyroid abnormalities but that has not been checked in the recent past.  She says occasionally she will have a sensation of chest heaviness.  She leads a  sedentary lifestyle because of orthopedic issues.  No radiation of this chest tightness symptoms to the neck or to the arms.  For this reason she was sent here for an evaluation.  At the time of my evaluation, the patient is alert awake oriented and in no distress.  Past Medical History:  Diagnosis Date  . Cervical cancer ([place])   . Glaucoma 2017  . History of echocardiogram    Echo 9/16:  Mild LVH, EF 55-60%, no RWMA, Gr 1 DD, mild MR // Echo 1/17:  Mild LVH, EF 50-55%, Gr 1 DD, mild to mod MR, mild LAE  . Hypertension   . Hyperthyroidism    s/p RAI treatment  . 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 Tobacco abuse   . Urinary incontinence     Past Surgical History:  Procedure Laterality Date  . REPAIR OF COMPLEX TRACTION RETINAL DETACHMENT    . TOTAL ABDOMINAL HYSTERECTOMY W/ BILATERAL SALPINGOOPHORECTOMY      Current Medications: Current Meds  Medication Sig  . albuterol (PROVENTIL HFA;VENTOLIN HFA) 108 (90 Base) MCG/ACT inhaler Inhale 2 puffs into the lungs every 6 (six) hours as needed for wheezing or shortness of breath.  Marland Kitchen amLODipine (NORVASC) 10 MG tablet Take 1 tablet (10 mg total) by mouth daily.  Marland Kitchen latanoprost (XALATAN) 0.005 % ophthalmic solution Place into both eyes daily. As directed  .  lisinopril-hydrochlorothiazide (PRINZIDE,ZESTORETIC) 20-25 MG tablet Take 1 tablet by mouth daily.  . methimazole (TAPAZOLE) 10 MG tablet Take 1 tablet (10 mg total) by mouth 3 (three) times daily.  . metoprolol succinate (TOPROL-XL) 100 MG 24 hr tablet Take 1 tablet (100 mg total) by mouth daily. Take with or immediately following a meal.  . Mirabegron (MYRBETRIQ PO) Take 50 mg by mouth daily.      Allergies:   Doxycycline; Codeine; and Pollen extract   Social History   Socioeconomic History  . Marital status: Single    Spouse name: Not on file  . Number of children: 2  . Years of education: Not on file  . Highest education level: Not on file  Occupational History    . Occupation: CNA    Comment: [employer]  . Financial resource strain: Not on file  . Food insecurity:    Worry: Not on file    Inability: Not on file  . Transportation needs:    Medical: Not on file    Non-medical: Not on file  Tobacco Use  . Smoking status: Former Smoker    Packs/day: 0.50    Years: 48.00    Pack years: 24.00    Types: Cigarettes    Start date: 11/30/1968    Last attempt to quit: 10/31/2016    Years since quitting: 1.4  . Smokeless tobacco: Never Used  . Tobacco comment: did  not smoke more than .5 pack   Substance and Sexual Activity  . Alcohol use: No    Alcohol/week: 0.0 standard drinks    Comment: rarely   . Drug use: No  . Sexual activity: Yes  Lifestyle  . Physical activity:    Days per week: Not on file    Minutes per session: Not on file  . Stress: Not on file  Relationships  . Social connections:    Talks on phone: Not on file    Gets together: Not on file    Attends religious service: Not on file    Active member of club or organization: Not on file    Attends meetings of clubs or organizations: Not on file    Relationship status: Not on file  Other Topics Concern  . Not on file  Social History Narrative   Caregiver   Daughter and grandson in household     Family History: The patient's family history includes Cancer in her maternal grandfather, paternal aunt, and sister; Colon cancer in her maternal aunt; Diabetes in her maternal uncle, mother, and paternal grandmother; Heart failure in her mother; Hypertension in her mother; Pancreatic cancer in her father; Thyroid disease in her paternal aunt and unknown relative.  ROS:   Please see the history of present illness.    All other systems reviewed and are negative.  EKGs/Labs/Other Studies Reviewed:    The following studies were reviewed today: EKG reveals sinus rhythm and nonspecific ST-T changes.   Recent Labs: 11/08/2017: Hemoglobin 13.6; Platelets 295.0; TSH  <0.01 12/10/2017: ALT 19; BUN 18; Creatinine, Ser 1.04; Potassium 4.1; Sodium 139  Recent Lipid Panel No results found for: CHOL, TRIG, HDL, CHOLHDL, VLDL, LDLCALC, LDLDIRECT  Physical Exam:    VS:  BP (!) 152/98 (BP Location: Right Arm, Patient Position: Sitting, Cuff Size: Normal)   Pulse 69   Ht 5\' 7"  (1.702 m)   Wt 202 lb (91.6 kg)   SpO2 98%   BMI 31.64 kg/m     Wt Readings from  Last 3 Encounters:  04/25/18 202 lb (91.6 kg)  04/05/18 199 lb 9.6 oz (90.5 kg)  02/15/18 204 lb 3.2 oz (92.6 kg)     GEN: Patient is in no acute distress HEENT: Normal NECK: No JVD; No carotid bruits LYMPHATICS: No lymphadenopathy CARDIAC: S1 S2 regular, 2/6 systolic murmur at the apex. RESPIRATORY:  Clear to auscultation without rales, wheezing or rhonchi  ABDOMEN: Soft, non-tender, non-distended MUSCULOSKELETAL:  No edema; No deformity  SKIN: Warm and dry NEUROLOGIC:  Alert and oriented x 3 PSYCHIATRIC:  Normal affect    Signed, Jenean Lindau, MD  04/25/2018 9:44 AM    [facility]

## 2018-04-26 ENCOUNTER — Telehealth: Payer: Self-pay | Admitting: Physician Assistant

## 2018-04-26 LAB — BASIC METABOLIC PANEL
BUN / CREAT RATIO: 19 (ref 12–28)
BUN: 18 mg/dL (ref 8–27)
CALCIUM: 10.3 mg/dL (ref 8.7–10.3)
CHLORIDE: 102 mmol/L (ref 96–106)
CO2: 23 mmol/L (ref 20–29)
Creatinine, Ser: 0.95 mg/dL (ref 0.57–1.00)
GFR calc Af Amer: 71 mL/min/{1.73_m2} (ref >59)
GFR calc non Af Amer: 61 mL/min/{1.73_m2} (ref >59)
Glucose: 93 mg/dL (ref 65–99)
Potassium: 4.1 mmol/L (ref 3.5–5.2)
Sodium: 141 mmol/L (ref 134–144)

## 2018-04-26 LAB — TSH: TSH: <0.006 u[IU]/mL — ABNORMAL LOW (ref 0.450–4.500)

## 2018-04-26 NOTE — Telephone Encounter (Signed)
Samantha aware and pt was notified. See result note.

## 2018-04-26 NOTE — Telephone Encounter (Signed)
Dr. Geraldo Pitter, Cardiology-See TSH level  For this patient.

## 2018-04-26 NOTE — Telephone Encounter (Signed)
See note

## 2018-04-29 ENCOUNTER — Encounter: Payer: Self-pay | Admitting: Endocrinology

## 2018-04-29 ENCOUNTER — Telehealth: Payer: Self-pay

## 2018-04-29 ENCOUNTER — Ambulatory Visit: Payer: Medicare HMO | Admitting: Endocrinology

## 2018-04-29 ENCOUNTER — Telehealth (HOSPITAL_COMMUNITY): Payer: Self-pay | Admitting: *Deleted

## 2018-04-29 VITALS — BP 124/80 | HR 68 | Ht 67.0 in | Wt 198.8 lb

## 2018-04-29 DIAGNOSIS — E0590 Thyrotoxicosis, unspecified without thyrotoxic crisis or storm: Secondary | ICD-10-CM

## 2018-04-29 NOTE — Patient Instructions (Addendum)
Please go off the methimazole, and: I have ordered for you a treatment pill of radioactive iodine.  you will again notice no symptoms from this.  The pill is gone from your body in a few days (during which you should stay away from other people), but takes several months to work.  Therefore, please return here approximately 6-8 weeks after the treatment.  This treatment has been available for many years, and the only known side-effect is an underactive thyroid.  It is possible that i would eventually prescribe for you a thyroid hormone pill, which is very inexpensive.  You don't have to worry about side-effects of this thyroid hormone pill, because it is the same molecule your thyroid makes.   If you want to resume the methimazole 3 days after the iodine pill, you can do so, and then come back for a follow-up appointment 2 weeks later.

## 2018-04-29 NOTE — Telephone Encounter (Signed)
Patient was seen this morning at 9 am by Dr. Loanne Drilling.

## 2018-04-29 NOTE — Telephone Encounter (Signed)
Patient given detailed instructions per Myocardial Perfusion Study Information Sheet for the test on 04/30/18 at 9:45. Patient notified to arrive 15 minutes early and that it is imperative to arrive on time for appointment to keep from having the test rescheduled.  If you need to cancel or reschedule your appointment, please call the office within 24 hours of your appointment. . Patient verbalized understanding.Kari Coleman

## 2018-04-29 NOTE — Telephone Encounter (Signed)
Per Dr. Loanne Drilling: Please schedule f/u appt for next available appointment   Message routed to scheduling coordinator for scheduling purposes

## 2018-04-29 NOTE — Progress Notes (Signed)
Subjective:    Patient ID: Kari Coleman, female    DOB: 14-Feb-1951, 68 y.o.   MRN: [id number]  HPI Pt returns for f/u of hyperthyroidism (dx'ed 2013; she was initially rx'ed tapazole, but she later chose RAI rx; [place] (2015) showed MNG; NM scan (2016) showed MNG with normal uptake (24%); Cytology (2016) showed BETHESDA CATEGORY 2 and 3).  She denies palpitations.  She resumed tapazole last week.   Past Medical History:  Diagnosis Date  . Cervical cancer ([place])   . Glaucoma 2017  . History of echocardiogram    Echo 9/16:  Mild LVH, EF 55-60%, no RWMA, Gr 1 DD, mild MR // Echo 1/17:  Mild LVH, EF 50-55%, Gr 1 DD, mild to mod MR, mild LAE  . Hypertension   . Hyperthyroidism    s/p RAI treatment  . 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 Tobacco abuse   . Urinary incontinence     Past Surgical History:  Procedure Laterality Date  . REPAIR OF COMPLEX TRACTION RETINAL DETACHMENT    . TOTAL ABDOMINAL HYSTERECTOMY W/ BILATERAL SALPINGOOPHORECTOMY      Social History   Socioeconomic History  . Marital status: Single    Spouse name: Not on file  . Number of children: 2  . Years of education: Not on file  . Highest education level: Not on file  Occupational History  . Occupation: CNA    Comment: [place]  . Financial resource strain: Not on file  . Food insecurity:    Worry: Not on file    Inability: Not on file  . Transportation needs:    Medical: Not on file    Non-medical: Not on file  Tobacco Use  . Smoking status: Former Smoker    Packs/day: 0.50    Years: 48.00    Pack years: 24.00    Types: Cigarettes    Start date: 11/30/1968    Last attempt to quit: 10/31/2016    Years since quitting: 1.4  . Smokeless tobacco: Never Used  . Tobacco comment: did  not smoke more than .5 pack   Substance and Sexual Activity  . Alcohol use: No    Alcohol/week: 0.0 standard drinks    Comment: rarely   . Drug use: No  . Sexual activity: Yes    Lifestyle  . Physical activity:    Days per week: Not on file    Minutes per session: Not on file  . Stress: Not on file  Relationships  . Social connections:    Talks on phone: Not on file    Gets together: Not on file    Attends religious service: Not on file    Active member of club or organization: Not on file    Attends meetings of clubs or organizations: Not on file    Relationship status: Not on file  . Intimate partner violence:    Fear of current or ex partner: Not on file    Emotionally abused: Not on file    Physically abused: Not on file    Forced sexual activity: Not on file  Other Topics Concern  . Not on file  Social History Narrative   Caregiver   Daughter and grandson in household    Current Outpatient Medications on File Prior to Visit  Medication Sig Dispense Refill  . ADULT ASPIRIN EC LOW STRENGTH PO Take by mouth.    Marland Kitchen albuterol (PROVENTIL HFA;VENTOLIN HFA)  108 (90 Base) MCG/ACT inhaler Inhale 2 puffs into the lungs every 6 (six) hours as needed for wheezing or shortness of breath. 1 Inhaler 0  . amLODipine (NORVASC) 10 MG tablet Take 1 tablet (10 mg total) by mouth daily. 90 tablet 0  . latanoprost (XALATAN) 0.005 % ophthalmic solution Place into both eyes daily. As directed    . lisinopril-hydrochlorothiazide (PRINZIDE,ZESTORETIC) 20-25 MG tablet Take 1 tablet by mouth daily. 90 tablet 0  . metoprolol succinate (TOPROL-XL) 100 MG 24 hr tablet Take 1 tablet (100 mg total) by mouth daily. Take with or immediately following a meal. 90 tablet 0  . Mirabegron (MYRBETRIQ PO) Take 50 mg by mouth daily.      No current facility-administered medications on file prior to visit.     Allergies  Allergen Reactions  . Doxycycline Nausea Only  . Codeine Nausea And Vomiting  . Pollen Extract     Family History  Problem Relation Age of Onset  . Hypertension Mother   . Heart failure Mother   . Diabetes Mother   . Pancreatic cancer Father   . Colon cancer  Maternal Aunt   . Cancer Sister   . Thyroid disease Paternal Aunt   . Cancer Paternal Aunt   . Thyroid disease Unknown        paternal side  . Cancer Maternal Grandfather   . Diabetes Paternal Grandmother   . Diabetes Maternal Uncle     BP 124/80 (BP Location: Left Arm, Patient Position: Sitting, Cuff Size: Normal)   Pulse 68   Ht 5\' 7"  (1.702 m)   Wt 198 lb 12.8 oz (90.2 kg)   SpO2 96%   BMI 31.14 kg/m   Review of Systems Denies tremor.     Objective:   Physical Exam VITAL SIGNS:  See vs page GENERAL: no distress NECK: the right thyroid nodule is again noted.     Lab Results  Component Value Date   TSH <0.006 (L) 04/25/2018      Assessment & Plan:  Hyperthyroidism, due to MNG, persistent.    Patient Instructions  Please go off the methimazole, and: I have ordered for you a treatment pill of radioactive iodine.  you will again notice no symptoms from this.  The pill is gone from your body in a few days (during which you should stay away from other people), but takes several months to work.  Therefore, please return here approximately 6-8 weeks after the treatment.  This treatment has been available for many years, and the only known side-effect is an underactive thyroid.  It is possible that i would eventually prescribe for you a thyroid hormone pill, which is very inexpensive.  You don't have to worry about side-effects of this thyroid hormone pill, because it is the same molecule your thyroid makes.   If you want to resume the methimazole 3 days after the iodine pill, you can do so, and then come back for a follow-up appointment 2 weeks later.

## 2018-04-30 ENCOUNTER — Ambulatory Visit (HOSPITAL_COMMUNITY): Payer: Medicare HMO | Attending: Cardiovascular Disease

## 2018-04-30 ENCOUNTER — Encounter: Payer: Self-pay | Admitting: *Deleted

## 2018-04-30 ENCOUNTER — Encounter: Payer: Medicare HMO | Admitting: *Deleted

## 2018-04-30 VITALS — Ht 67.0 in | Wt 202.0 lb

## 2018-04-30 DIAGNOSIS — R0789 Other chest pain: Secondary | ICD-10-CM | POA: Diagnosis not present

## 2018-04-30 DIAGNOSIS — Z006 Encounter for examination for normal comparison and control in clinical research program: Secondary | ICD-10-CM

## 2018-04-30 LAB — MYOCARDIAL PERFUSION IMAGING
CHL CUP RESTING HR STRESS: 74 {beats}/min
CSEPPHR: 109 {beats}/min
LV sys vol: 30 mL
LVDIAVOL: 80 mL (ref 46–106)
SDS: 0
SRS: 0
SSS: 0
TID: 0.91

## 2018-04-30 MED ORDER — TECHNETIUM TC 99M TETROFOSMIN IV KIT
32.7000 | PACK | Freq: Once | INTRAVENOUS | Status: AC | PRN
  Administered 2018-04-30: 32.7 via INTRAVENOUS
  Filled 2018-04-30: qty 33

## 2018-04-30 MED ORDER — REGADENOSON 0.4 MG/5ML IV SOLN
0.4000 mg | Freq: Once | INTRAVENOUS | Status: AC
  Administered 2018-04-30: 0.4 mg via INTRAVENOUS

## 2018-04-30 MED ORDER — TECHNETIUM TC 99M TETROFOSMIN IV KIT
10.9000 | PACK | Freq: Once | INTRAVENOUS | Status: AC | PRN
  Administered 2018-04-30: 10.9 via INTRAVENOUS
  Filled 2018-04-30: qty 11

## 2018-04-30 NOTE — Research (Signed)
CADFEM Informed Consent                  Subject Name:   Kari Coleman. Kari Coleman   Subject met inclusion and exclusion criteria.  The informed consent form, study requirements and expectations were reviewed with the subject and questions and concerns were addressed prior to the signing of the consent form.  The subject verbalized understanding of the trial requirements.  The subject agreed to participate in the CADFEM trial and signed the informed consent.  The informed consent was obtained prior to performance of any protocol-specific procedures for the subject.  A copy of the signed informed consent was given to the subject and a copy was placed in the subject's medical record.   [place] Ashantia Amaral, Research Assistant 04/30/18 09:26 a.m.

## 2018-05-01 ENCOUNTER — Other Ambulatory Visit: Payer: Self-pay | Admitting: Endocrinology

## 2018-05-01 DIAGNOSIS — R002 Palpitations: Secondary | ICD-10-CM

## 2018-05-01 DIAGNOSIS — E0590 Thyrotoxicosis, unspecified without thyrotoxic crisis or storm: Secondary | ICD-10-CM

## 2018-05-02 ENCOUNTER — Ambulatory Visit (INDEPENDENT_AMBULATORY_CARE_PROVIDER_SITE_OTHER): Payer: Medicare HMO

## 2018-05-02 DIAGNOSIS — R002 Palpitations: Secondary | ICD-10-CM

## 2018-05-03 DIAGNOSIS — N3946 Mixed incontinence: Secondary | ICD-10-CM | POA: Diagnosis not present

## 2018-05-03 DIAGNOSIS — R350 Frequency of micturition: Secondary | ICD-10-CM | POA: Diagnosis not present

## 2018-05-09 ENCOUNTER — Encounter: Payer: Self-pay | Admitting: *Deleted

## 2018-05-13 ENCOUNTER — Telehealth: Payer: Self-pay | Admitting: Endocrinology

## 2018-05-13 DIAGNOSIS — E0590 Thyrotoxicosis, unspecified without thyrotoxic crisis or storm: Secondary | ICD-10-CM

## 2018-05-13 NOTE — Addendum Note (Signed)
Addended by: Renato Shin on: 05/13/2018 04:49 PM   Modules accepted: Orders

## 2018-05-13 NOTE — Telephone Encounter (Signed)
Gabriel Cirri from [facility] is requesting a call back. She stated the CP Codes are incorrect.  [phone] [id number]

## 2018-05-13 NOTE — Telephone Encounter (Signed)
Called Sabrina back and she stated that the CP code is for a different procedure than the one ordered by Dr. Loanne Drilling. Current CP code is [phone], which is myocardial perfusion imaging. Please advise.

## 2018-05-13 NOTE — Telephone Encounter (Signed)
I reordered.

## 2018-05-16 ENCOUNTER — Encounter (HOSPITAL_COMMUNITY)
Admission: RE | Admit: 2018-05-16 | Discharge: 2018-05-16 | Disposition: A | Discharge status: Home / Self Care | Payer: Medicare HMO | Source: Ambulatory Visit | Attending: Endocrinology | Admitting: Endocrinology

## 2018-05-16 DIAGNOSIS — E0590 Thyrotoxicosis, unspecified without thyrotoxic crisis or storm: Secondary | ICD-10-CM | POA: Diagnosis present

## 2018-05-16 DIAGNOSIS — R002 Palpitations: Secondary | ICD-10-CM | POA: Diagnosis present

## 2018-05-17 ENCOUNTER — Other Ambulatory Visit: Payer: Self-pay | Admitting: Endocrinology

## 2018-05-17 ENCOUNTER — Encounter (HOSPITAL_COMMUNITY)
Admission: RE | Admit: 2018-05-17 | Discharge: 2018-05-17 | Disposition: A | Discharge status: Home / Self Care | Payer: Medicare HMO | Source: Ambulatory Visit | Attending: Endocrinology | Admitting: Endocrinology

## 2018-05-17 DIAGNOSIS — E0590 Thyrotoxicosis, unspecified without thyrotoxic crisis or storm: Secondary | ICD-10-CM | POA: Insufficient documentation

## 2018-05-17 DIAGNOSIS — R002 Palpitations: Secondary | ICD-10-CM | POA: Insufficient documentation

## 2018-06-10 ENCOUNTER — Ambulatory Visit (INDEPENDENT_AMBULATORY_CARE_PROVIDER_SITE_OTHER): Payer: Medicare HMO | Admitting: Physician Assistant

## 2018-06-10 ENCOUNTER — Encounter: Payer: Self-pay | Admitting: Physician Assistant

## 2018-06-10 VITALS — BP 140/90 | HR 83 | Temp 97.7°F | Ht 67.0 in | Wt 197.5 lb

## 2018-06-10 DIAGNOSIS — I10 Essential (primary) hypertension: Secondary | ICD-10-CM

## 2018-06-10 DIAGNOSIS — Z23 Encounter for immunization: Secondary | ICD-10-CM | POA: Diagnosis not present

## 2018-06-10 DIAGNOSIS — E0590 Thyrotoxicosis, unspecified without thyrotoxic crisis or storm: Secondary | ICD-10-CM

## 2018-06-10 DIAGNOSIS — R32 Unspecified urinary incontinence: Secondary | ICD-10-CM | POA: Diagnosis not present

## 2018-06-10 NOTE — Progress Notes (Signed)
Kari Coleman is a 68 y.o. female here for a new problem.  History of Present Illness:   Chief Complaint  Patient presents with  . Follow-up    Bladder issues.    HPI   Urinary incontinence  --patient is seeing [facility] for this.  They recently put her on Lisbeth Ply however she states that she is not sure if it is working.  She has a follow-up appointment with them in 4 days.  Hypothyroidism --patient had a RAI in January, and was mailed a letter per chart review.  Patient states that she did not receive this letter.  I printed this out for her.  She has not followed up with endocrine since.  She is currently not on her methimazole.  She does not have a scheduled follow-up.  HTN -- Currently taking Norvasc 10 mg, metoprolol succinate 100 mg, lisinopril-HCTZ 20-25 mg.  She does not check her blood pressures at home. Patient denies chest pain, SOB, blurred vision, dizziness, unusual headaches, lower leg swelling. Patient is compliant with medication. Denies excessive caffeine intake, stimulant usage, excessive alcohol intake, or increase in salt consumption.   BP Readings from Last 3 Encounters:  06/10/18 140/90  04/29/18 124/80  04/25/18 (!) 152/98     Past Medical History:  Diagnosis Date  . Cervical cancer ([place])   . Glaucoma 2017  . History of echocardiogram    Echo 9/16:  Mild LVH, EF 55-60%, no RWMA, Gr 1 DD, mild MR // Echo 1/17:  Mild LVH, EF 50-55%, Gr 1 DD, mild to mod MR, mild LAE  . Hypertension   . Hyperthyroidism    s/p RAI treatment  . 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 Tobacco abuse   . Urinary incontinence      Social History   Socioeconomic History  . Marital status: Single    Spouse name: Not on file  . Number of children: 2  . Years of education: Not on file  . Highest education level: Not on file  Occupational History  . Occupation: CNA    Comment: [place]  . Financial resource strain: Not on file  .  Food insecurity:    Worry: Not on file    Inability: Not on file  . Transportation needs:    Medical: Not on file    Non-medical: Not on file  Tobacco Use  . Smoking status: Former Smoker    Packs/day: 0.50    Years: 48.00    Pack years: 24.00    Types: Cigarettes    Start date: 11/30/1968    Last attempt to quit: 10/31/2016    Years since quitting: 1.6  . Smokeless tobacco: Never Used  . Tobacco comment: did  not smoke more than .5 pack   Substance and Sexual Activity  . Alcohol use: No    Alcohol/week: 0.0 standard drinks    Comment: rarely   . Drug use: No  . Sexual activity: Yes  Lifestyle  . Physical activity:    Days per week: Not on file    Minutes per session: Not on file  . Stress: Not on file  Relationships  . Social connections:    Talks on phone: Not on file    Gets together: Not on file    Attends religious service: Not on file    Active member of club or organization: Not on file    Attends meetings of clubs or organizations:  Not on file    Relationship status: Not on file  . Intimate partner violence:    Fear of current or ex partner: Not on file    Emotionally abused: Not on file    Physically abused: Not on file    Forced sexual activity: Not on file  Other Topics Concern  . Not on file  Social History Narrative   Caregiver   Daughter and grandson in household    Past Surgical History:  Procedure Laterality Date  . REPAIR OF COMPLEX TRACTION RETINAL DETACHMENT    . TOTAL ABDOMINAL HYSTERECTOMY W/ BILATERAL SALPINGOOPHORECTOMY      Family History  Problem Relation Age of Onset  . Hypertension Mother   . Heart failure Mother   . Diabetes Mother   . Pancreatic cancer Father   . Colon cancer Maternal Aunt   . Cancer Sister   . Thyroid disease Paternal Aunt   . Cancer Paternal Aunt   . Thyroid disease Other        paternal side  . Cancer Maternal Grandfather   . Diabetes Paternal Grandmother   . Diabetes Maternal Uncle     Allergies   Allergen Reactions  . Doxycycline Nausea Only  . Codeine Nausea And Vomiting  . Pollen Extract     Current Medications:   Current Outpatient Medications:  .  ADULT ASPIRIN EC LOW STRENGTH PO, Take by mouth., Disp: , Rfl:  .  albuterol (PROVENTIL HFA;VENTOLIN HFA) 108 (90 Base) MCG/ACT inhaler, Inhale 2 puffs into the lungs every 6 (six) hours as needed for wheezing or shortness of breath., Disp: 1 Inhaler, Rfl: 0 .  amLODipine (NORVASC) 10 MG tablet, Take 1 tablet (10 mg total) by mouth daily., Disp: 90 tablet, Rfl: 0 .  latanoprost (XALATAN) 0.005 % ophthalmic solution, Place into both eyes daily. As directed, Disp: , Rfl:  .  lisinopril-hydrochlorothiazide (PRINZIDE,ZESTORETIC) 20-25 MG tablet, Take 1 tablet by mouth daily., Disp: 90 tablet, Rfl: 0 .  metoprolol succinate (TOPROL-XL) 100 MG 24 hr tablet, Take 1 tablet (100 mg total) by mouth daily. Take with or immediately following a meal., Disp: 90 tablet, Rfl: 0 .  Mirabegron (MYRBETRIQ PO), Take 50 mg by mouth daily. , Disp: , Rfl:    Review of Systems:   Review of Systems  Constitutional: Negative for chills, fever, malaise/fatigue and weight loss.  Respiratory: Negative for cough, hemoptysis and shortness of breath.   Cardiovascular: Negative for chest pain, orthopnea, claudication and leg swelling.  Gastrointestinal: Negative for heartburn, nausea and vomiting.  Genitourinary: Positive for urgency. Negative for frequency and hematuria.  Neurological: Negative for dizziness, tingling and headaches.    Vitals:   Vitals:   06/10/18 0936  BP: 140/90  Pulse: 83  Temp: 97.7 F (36.5 C)  TempSrc: Oral  SpO2: 95%  Weight: 197 lb 8 oz (89.6 kg)  Height: 5\' 7"  (1.702 m)     Body mass index is 30.93 kg/m.  Physical Exam:   Physical Exam Vitals signs and nursing note reviewed.  Constitutional:      General: She is not in acute distress.    Appearance: She is well-developed. She is not ill-appearing or toxic-appearing.   Cardiovascular:     Rate and Rhythm: Normal rate and regular rhythm.     Pulses: Normal pulses.     Heart sounds: Normal heart sounds, S1 normal and S2 normal.     Comments: No LE edema Pulmonary:     Effort: Pulmonary effort is normal.  Breath sounds: Normal breath sounds.  Skin:    General: Skin is warm and dry.  Neurological:     Mental Status: She is alert.     GCS: GCS eye subscore is 4. GCS verbal subscore is 5. GCS motor subscore is 6.  Psychiatric:        Speech: Speech normal.        Behavior: Behavior normal. Behavior is cooperative.      Assessment and Plan:   Kamya was seen today for follow-up.  Diagnoses and all orders for this visit:  Essential hypertension Blood pressure is stable, trending up however.  Continue current regimen.  She is noncompliant with her methimazole.  I recommended that she follow-up with Dr. Loanne Drilling ASAP to follow-up on the scan that she had as well as discuss if she needs methimazole to be taken on a regular basis.  Flu vaccine need -     Flu vaccine HIGH DOSE PF (Fluzone High dose)  Hyperthyroidism Follow-up with Dr. Loanne Drilling ASAP.  Urinary incontinence, unspecified type Followed by urology.  Is going to be seeing in 4 days.  . Reviewed expectations re: course of current medical issues. . Discussed self-management of symptoms. . Outlined signs and symptoms indicating need for more acute intervention. . Patient verbalized understanding and all questions were answered. . See orders for this visit as documented in the electronic medical record. . Patient received an After-Visit Summary.   Inda Coke, PA-C

## 2018-06-10 NOTE — Patient Instructions (Signed)
Please call and make an appointment with Dr. Loanne Drilling ASAP.  Continue seeing your other specialists.  Come back and see me in 3 months.

## 2018-06-14 DIAGNOSIS — N3946 Mixed incontinence: Secondary | ICD-10-CM | POA: Diagnosis not present

## 2018-06-18 ENCOUNTER — Ambulatory Visit: Payer: Medicare HMO | Admitting: Endocrinology

## 2018-06-19 ENCOUNTER — Other Ambulatory Visit: Payer: Self-pay

## 2018-06-19 ENCOUNTER — Ambulatory Visit: Payer: Medicare HMO | Admitting: Endocrinology

## 2018-06-19 ENCOUNTER — Encounter: Payer: Self-pay | Admitting: Endocrinology

## 2018-06-19 VITALS — BP 150/90 | HR 86 | Ht 67.0 in | Wt 198.6 lb

## 2018-06-19 DIAGNOSIS — E0590 Thyrotoxicosis, unspecified without thyrotoxic crisis or storm: Secondary | ICD-10-CM

## 2018-06-19 LAB — T4, FREE: Free T4: 0.88 ng/dL (ref 0.60–1.60)

## 2018-06-19 LAB — TSH: TSH: <0.01 u[IU]/mL — ABNORMAL LOW (ref 0.35–4.50)

## 2018-06-19 NOTE — Patient Instructions (Addendum)
Your blood pressure is high today.  Please see your primary care provider soon, to have it rechecked. Blood tests are requested for you today.  We'll let you know about the results.   Please go off the methimazole, and:  I have ordered for you a treatment pill of radioactive iodine.  you will again notice no symptoms from this.  The pill is gone from your body in a few days (during which you should stay away from other people), but takes several months to work.  Therefore, please return here approximately 6-8 weeks after the treatment.  This treatment has been available for many years, and the only known side-effect is an underactive thyroid.  It is possible that i would eventually prescribe for you a thyroid hormone pill, which is very inexpensive.  You don't have to worry about side-effects of this thyroid hormone pill, because it is the same molecule your thyroid makes.   If you want to resume the methimazole 3 days after the iodine pill, you can do so, and then come back for a follow-up appointment 2 weeks later.

## 2018-06-19 NOTE — Progress Notes (Signed)
Subjective:    Patient ID: Kari Coleman, female    DOB: Mar 23, 1951, 68 y.o.   MRN: [id number]  HPI Pt returns for f/u of hyperthyroidism (dx'ed 2013; she was initially rx'ed tapazole, but she later chose RAI rx; [place] (2015) showed MNG; NM scan (2016) showed MNG with normal uptake (24%); Cytology (2016) showed BETHESDA CATEGORY 2 and 3).  She had scan in early 2020, but she did not have rx dose.  pt states she feels well in general.  She takes tapazole 5-BID Past Medical History:  Diagnosis Date  . Cervical cancer ([place])   . Glaucoma 2017  . History of echocardiogram    Echo 9/16:  Mild LVH, EF 55-60%, no RWMA, Gr 1 DD, mild MR // Echo 1/17:  Mild LVH, EF 50-55%, Gr 1 DD, mild to mod MR, mild LAE  . Hypertension   . Hyperthyroidism    s/p RAI treatment  . 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 Tobacco abuse   . Urinary incontinence     Past Surgical History:  Procedure Laterality Date  . REPAIR OF COMPLEX TRACTION RETINAL DETACHMENT    . TOTAL ABDOMINAL HYSTERECTOMY W/ BILATERAL SALPINGOOPHORECTOMY      Social History   Socioeconomic History  . Marital status: Single    Spouse name: Not on file  . Number of children: 2  . Years of education: Not on file  . Highest education level: Not on file  Occupational History  . Occupation: CNA    Comment: [place]  . Financial resource strain: Not on file  . Food insecurity:    Worry: Not on file    Inability: Not on file  . Transportation needs:    Medical: Not on file    Non-medical: Not on file  Tobacco Use  . Smoking status: Former Smoker    Packs/day: 0.50    Years: 48.00    Pack years: 24.00    Types: Cigarettes    Start date: 11/30/1968    Last attempt to quit: 10/31/2016    Years since quitting: 1.6  . Smokeless tobacco: Never Used  . Tobacco comment: did  not smoke more than .5 pack   Substance and Sexual Activity  . Alcohol use: No    Alcohol/week: 0.0 standard drinks   Comment: rarely   . Drug use: No  . Sexual activity: Yes  Lifestyle  . Physical activity:    Days per week: Not on file    Minutes per session: Not on file  . Stress: Not on file  Relationships  . Social connections:    Talks on phone: Not on file    Gets together: Not on file    Attends religious service: Not on file    Active member of club or organization: Not on file    Attends meetings of clubs or organizations: Not on file    Relationship status: Not on file  . Intimate partner violence:    Fear of current or ex partner: Not on file    Emotionally abused: Not on file    Physically abused: Not on file    Forced sexual activity: Not on file  Other Topics Concern  . Not on file  Social History Narrative   Caregiver   Daughter and grandson in household    Current Outpatient Medications on File Prior to Visit  Medication Sig Dispense Refill  . ADULT ASPIRIN EC LOW STRENGTH  PO Take by mouth.    Marland Kitchen albuterol (PROVENTIL HFA;VENTOLIN HFA) 108 (90 Base) MCG/ACT inhaler Inhale 2 puffs into the lungs every 6 (six) hours as needed for wheezing or shortness of breath. 1 Inhaler 0  . amLODipine (NORVASC) 10 MG tablet Take 1 tablet (10 mg total) by mouth daily. 90 tablet 0  . latanoprost (XALATAN) 0.005 % ophthalmic solution Place into both eyes daily. As directed    . lisinopril-hydrochlorothiazide (PRINZIDE,ZESTORETIC) 20-25 MG tablet Take 1 tablet by mouth daily. 90 tablet 0  . metoprolol succinate (TOPROL-XL) 100 MG 24 hr tablet Take 1 tablet (100 mg total) by mouth daily. Take with or immediately following a meal. 90 tablet 0  . Mirabegron (MYRBETRIQ PO) Take 50 mg by mouth daily.      No current facility-administered medications on file prior to visit.     Allergies  Allergen Reactions  . Doxycycline Nausea Only  . Codeine Nausea And Vomiting  . Pollen Extract     Family History  Problem Relation Age of Onset  . Hypertension Mother   . Heart failure Mother   . Diabetes  Mother   . Pancreatic cancer Father   . Colon cancer Maternal Aunt   . Cancer Sister   . Thyroid disease Paternal Aunt   . Cancer Paternal Aunt   . Thyroid disease Other        paternal side  . Cancer Maternal Grandfather   . Diabetes Paternal Grandmother   . Diabetes Maternal Uncle     BP (!) 150/90 (BP Location: Left Arm, Patient Position: Sitting, Cuff Size: Normal)   Pulse 86   Ht 5\' 7"  (1.702 m)   Wt 198 lb 9.6 oz (90.1 kg)   SpO2 96%   BMI 31.11 kg/m     Review of Systems Denies fever    Objective:   Physical Exam VITAL SIGNS:  See vs page.   GENERAL: no distress.  NECK: Thyroid is slightly enlarged, with irreg surface.     Lab Results  Component Value Date   TSH <0.01 (L) 06/19/2018      Assessment & Plan:  HTN: is noted today Hyperthyroidism, persistent.  We discussed rx options.  She chooses Radioactive iodine treatment  Patient Instructions  Your blood pressure is high today.  Please see your primary care provider soon, to have it rechecked. Blood tests are requested for you today.  We'll let you know about the results.   Please go off the methimazole, and:  I have ordered for you a treatment pill of radioactive iodine.  you will again notice no symptoms from this.  The pill is gone from your body in a few days (during which you should stay away from other people), but takes several months to work.  Therefore, please return here approximately 6-8 weeks after the treatment.  This treatment has been available for many years, and the only known side-effect is an underactive thyroid.  It is possible that i would eventually prescribe for you a thyroid hormone pill, which is very inexpensive.  You don't have to worry about side-effects of this thyroid hormone pill, because it is the same molecule your thyroid makes.   If you want to resume the methimazole 3 days after the iodine pill, you can do so, and then come back for a follow-up appointment 2 weeks later.

## 2018-06-21 ENCOUNTER — Other Ambulatory Visit: Payer: Self-pay | Admitting: Physician Assistant

## 2018-07-03 ENCOUNTER — Ambulatory Visit: Payer: Medicare HMO | Admitting: Endocrinology

## 2018-07-16 ENCOUNTER — Encounter: Payer: Self-pay | Admitting: Physician Assistant

## 2018-07-16 ENCOUNTER — Ambulatory Visit (INDEPENDENT_AMBULATORY_CARE_PROVIDER_SITE_OTHER): Payer: Medicare HMO | Admitting: Physician Assistant

## 2018-07-16 VITALS — BP 150/90 | HR 76

## 2018-07-16 DIAGNOSIS — I10 Essential (primary) hypertension: Secondary | ICD-10-CM

## 2018-07-16 DIAGNOSIS — E0590 Thyrotoxicosis, unspecified without thyrotoxic crisis or storm: Secondary | ICD-10-CM | POA: Diagnosis not present

## 2018-07-16 MED ORDER — METOPROLOL SUCCINATE ER 100 MG PO TB24: 100.0000 mg | ORAL_TABLET | Freq: Every day | ORAL | 1 refills | Status: DC

## 2018-07-16 NOTE — Progress Notes (Signed)
Virtual Visit via Video   I connected with Kari Coleman on 07/17/18 at 11:00 AM EDT by a video enabled telemedicine application and verified that I am speaking with the correct person using two identifiers. Location patient: Home Location provider: [facility], Office Persons participating in the virtual visit: [place], [place], [place]   I discussed the limitations of evaluation and management by telemedicine and the availability of in person appointments. The patient expressed understanding and agreed to proceed.  Subjective:   HPI:   Currently taking Norvasc 10 mg, Lisinopril-HCTZ 20-25 mg, Toprol 50 mg. At home blood pressure readings are: 150/90. Patient denies chest pain, SOB, blurred vision, dizziness, unusual headaches, lower leg swelling. Patient is compliant with medication. Denies excessive caffeine intake, stimulant usage, excessive alcohol intake, or increase in salt consumption.  She has been on Toprol 100 mg in the past and for some reason she is currently on a lesser dose of 50 mg daily. She states that she felt better while on this medication.  Hyperthyroidism -- continues to be managed by Dr. Loanne Drilling. She stopped her Methimazole and was supposed to do RAI therapy for her thyroid but hasn't heard anything.  ROS: See pertinent positives and negatives per HPI.  Patient Active Problem List   Diagnosis Date Noted  . Chest discomfort 04/25/2018  . Ex-smoker 04/25/2018  . Primary osteoarthritis of right knee 01/04/2016  . Hypertension   . Hyperthyroidism     Social History   Tobacco Use  . Smoking status: Former Smoker    Packs/day: 0.50    Years: 48.00    Pack years: 24.00    Types: Cigarettes    Start date: 11/30/1968    Last attempt to quit: 10/31/2016    Years since quitting: 1.7  . Smokeless tobacco: Never Used  . Tobacco comment: did  not smoke more than .5 pack   Substance Use Topics  . Alcohol use: No    Alcohol/week: 0.0 standard drinks   Comment: rarely     Current Outpatient Medications:  .  ADULT ASPIRIN EC LOW STRENGTH PO, Take by mouth., Disp: , Rfl:  .  albuterol (PROVENTIL HFA;VENTOLIN HFA) 108 (90 Base) MCG/ACT inhaler, Inhale 2 puffs into the lungs every 6 (six) hours as needed for wheezing or shortness of breath., Disp: 1 Inhaler, Rfl: 0 .  amLODipine (NORVASC) 10 MG tablet, Take 1 tablet by mouth once daily, Disp: 90 tablet, Rfl: 0 .  latanoprost (XALATAN) 0.005 % ophthalmic solution, Place into both eyes daily. As directed, Disp: , Rfl:  .  lisinopril-hydrochlorothiazide (PRINZIDE,ZESTORETIC) 20-25 MG tablet, Take 1 tablet by mouth once daily, Disp: 90 tablet, Rfl: 0 .  metoprolol succinate (TOPROL-XL) 100 MG 24 hr tablet, Take 1 tablet (100 mg total) by mouth daily. Take with or immediately following a meal., Disp: 90 tablet, Rfl: 1 .  Mirabegron (MYRBETRIQ PO), Take 50 mg by mouth daily. , Disp: , Rfl:   Allergies  Allergen Reactions  . Doxycycline Nausea Only  . Codeine Nausea And Vomiting  . Pollen Extract     Objective:   VITALS: Per patient if applicable, see vitals. GENERAL: Alert, appears well and in no acute distress. HEENT: Atraumatic, conjunctiva clear, no obvious abnormalities on inspection of external nose and ears. NECK: Normal movements of the head and neck. CARDIOPULMONARY: No increased WOB. Speaking in clear sentences. I:E ratio WNL.  MS: Moves all visible extremities without noticeable abnormality. PSYCH: Pleasant and cooperative, well-groomed. Speech normal rate  and rhythm. Affect is appropriate. Insight and judgement are appropriate. Attention is focused, linear, and appropriate.  NEURO: CN grossly intact. Oriented as arrived to appointment on time with no prompting. Moves both UE equally.  SKIN: No obvious lesions, wounds, erythema, or cyanosis noted on face or hands.  Assessment and Plan:   Whitnie was seen today for hypertension.  Diagnoses and all orders for this visit:  Essential  hypertension Uncontrolled. She felt better on Toprol 100 mg, will increase to this now. Continue other medications. Follow-up next week with [place], appointment scheduled for Tuesday.  Hyperthyroidism She has not heard about her RAI treatment. I will reach out to endo PA Mingo Amber to get clarification on this.  Other orders -     metoprolol succinate (TOPROL-XL) 100 MG 24 hr tablet; Take 1 tablet (100 mg total) by mouth daily. Take with or immediately following a meal.   . Reviewed expectations re: course of current medical issues. . Discussed self-management of symptoms. . Outlined signs and symptoms indicating need for more acute intervention. . Patient verbalized understanding and all questions were answered. Marland Kitchen Health Maintenance issues including appropriate healthy diet, exercise, and smoking avoidance were discussed with patient. . See orders for this visit as documented in the electronic medical record.  I discussed the assessment and treatment plan with the patient. The patient was provided an opportunity to ask questions and all were answered. The patient agreed with the plan and demonstrated an understanding of the instructions.   The patient was advised to call back or seek an in-person evaluation if the symptoms worsen or if the condition fails to improve as anticipated.   [place], [place] 07/17/2018

## 2018-07-17 ENCOUNTER — Encounter: Payer: Self-pay | Admitting: Physician Assistant

## 2018-07-22 ENCOUNTER — Telehealth: Payer: Self-pay

## 2018-07-22 NOTE — Telephone Encounter (Signed)
Received notification from [facility] that PA for [place] for [facility] Other, Radiation Therapy using Iodine-131 has been approved 07/05/18 through 01/15/19. Josem Kaufmann #[id number]. Documents have been labeled and placed in scan file for HIM and for our future reference.

## 2018-07-23 ENCOUNTER — Encounter: Payer: Self-pay | Admitting: Physician Assistant

## 2018-07-23 ENCOUNTER — Ambulatory Visit (INDEPENDENT_AMBULATORY_CARE_PROVIDER_SITE_OTHER): Payer: Medicare HMO | Admitting: Physician Assistant

## 2018-07-23 DIAGNOSIS — E0590 Thyrotoxicosis, unspecified without thyrotoxic crisis or storm: Secondary | ICD-10-CM | POA: Diagnosis not present

## 2018-07-23 DIAGNOSIS — R7309 Other abnormal glucose: Secondary | ICD-10-CM | POA: Diagnosis not present

## 2018-07-23 DIAGNOSIS — R413 Other amnesia: Secondary | ICD-10-CM

## 2018-07-23 DIAGNOSIS — I10 Essential (primary) hypertension: Secondary | ICD-10-CM | POA: Diagnosis not present

## 2018-07-23 NOTE — Progress Notes (Signed)
Virtual Visit via Video   I connected with Kari Coleman on 07/23/18 at  9:00 AM EDT by a video enabled telemedicine application and verified that I am speaking with the correct person using two identifiers. Location patient: Home Location provider: [facility], Office Persons participating in the virtual visit: Zeriah, [place], PA-C  I discussed the limitations of evaluation and management by telemedicine and the availability of in person appointments. The patient expressed understanding and agreed to proceed.  Subjective:   HPI:  Hypertension Pt following up today on blood pressure. Pt was restarted on Metoprolol 100 mg daily a week ago. Pt is feeling better. Pt denies headaches, dizziness, SOB. Current blood pressure is 145/81.  Brain fog/HyperTSH She also endorses brain fog. She states that she has been dealing with this for a few months, however it has worsened over the past month. The issues are always in the AM. She is not sleeping well, only gets 4 hours of sleep at night. Feels like this is getting worse. She does have uncontrolled hyperTSH and states that she is having night sweats as well. She finally has RAI appointment on Friday. She denies: forgetting names, poor appetite, remembering where places are when she is driving, misplacing items.   ROS: See pertinent positives and negatives per HPI.  Patient Active Problem List   Diagnosis Date Noted  . Chest discomfort 04/25/2018  . Ex-smoker 04/25/2018  . Primary osteoarthritis of right knee 01/04/2016  . Hypertension   . Hyperthyroidism     Social History   Tobacco Use  . Smoking status: Former Smoker    Packs/day: 0.50    Years: 48.00    Pack years: 24.00    Types: Cigarettes    Start date: 11/30/1968    Last attempt to quit: 10/31/2016    Years since quitting: 1.7  . Smokeless tobacco: Never Used  . Tobacco comment: did  not smoke more than .5 pack   Substance Use Topics  . Alcohol use: No   Alcohol/week: 0.0 standard drinks    Comment: rarely     Current Outpatient Medications:  .  ADULT ASPIRIN EC LOW STRENGTH PO, Take by mouth., Disp: , Rfl:  .  albuterol (PROVENTIL HFA;VENTOLIN HFA) 108 (90 Base) MCG/ACT inhaler, Inhale 2 puffs into the lungs every 6 (six) hours as needed for wheezing or shortness of breath., Disp: 1 Inhaler, Rfl: 0 .  amLODipine (NORVASC) 10 MG tablet, Take 1 tablet by mouth once daily, Disp: 90 tablet, Rfl: 0 .  latanoprost (XALATAN) 0.005 % ophthalmic solution, Place into both eyes daily. As directed, Disp: , Rfl:  .  lisinopril-hydrochlorothiazide (PRINZIDE,ZESTORETIC) 20-25 MG tablet, Take 1 tablet by mouth once daily, Disp: 90 tablet, Rfl: 0 .  metoprolol succinate (TOPROL-XL) 100 MG 24 hr tablet, Take 1 tablet (100 mg total) by mouth daily. Take with or immediately following a meal., Disp: 90 tablet, Rfl: 1 .  Mirabegron (MYRBETRIQ PO), Take 50 mg by mouth daily. , Disp: , Rfl:   Allergies  Allergen Reactions  . Doxycycline Nausea Only  . Codeine Nausea And Vomiting  . Pollen Extract     Objective:   VITALS: Per patient if applicable, see vitals. GENERAL: Alert, appears well and in no acute distress. HEENT: Atraumatic, conjunctiva clear, no obvious abnormalities on inspection of external nose and ears. NECK: Normal movements of the head and neck. CARDIOPULMONARY: No increased WOB. Speaking in clear sentences. I:E ratio WNL.  MS: Moves all visible  extremities without noticeable abnormality. PSYCH: Pleasant and cooperative, well-groomed. Speech normal rate and rhythm. Affect is appropriate. Insight and judgement are appropriate. Attention is focused, linear, and appropriate.  NEURO: CN grossly intact. Oriented as arrived to appointment on time with no prompting. Moves both UE equally.  SKIN: No obvious lesions, wounds, erythema, or cyanosis noted on face or hands.  Assessment and Plan:   Alicen was seen today for hypertension.  Diagnoses and  all orders for this visit:  Memory changes I think this is largely due to her ongoing uncontrolled thyroid. Will re-evaluate in about 1 month after completion of RAI treatment. Hopefully her sleep will improve as well. Will have patient come in for labs to recheck CBC and CMP given worsening symptoms in past month. -     CBC with Differential/Platelet -     Comprehensive metabolic panel  Hyperthyroidism Management per Loanne Drilling. Discussed importance of keeping appointment for RAI and close follow-up.  Essential hypertension Improved. Again, needs to be re-visited about 1 month after RAI treatment, sooner if issues.  . Reviewed expectations re: course of current medical issues. . Discussed self-management of symptoms. . Outlined signs and symptoms indicating need for more acute intervention. . Patient verbalized understanding and all questions were answered. Marland Kitchen Health Maintenance issues including appropriate healthy diet, exercise, and smoking avoidance were discussed with patient. . See orders for this visit as documented in the electronic medical record.  I discussed the assessment and treatment plan with the patient. The patient was provided an opportunity to ask questions and all were answered. The patient agreed with the plan and demonstrated an understanding of the instructions.   The patient was advised to call back or seek an in-person evaluation if the symptoms worsen or if the condition fails to improve as anticipated.  CMA or LPN served as scribe during this visit. History, Physical, and Plan performed by medical provider. The above documentation has been reviewed and is accurate and complete.   [place], [place] 07/23/2018

## 2018-07-24 ENCOUNTER — Other Ambulatory Visit: Payer: Self-pay | Admitting: Physician Assistant

## 2018-07-24 ENCOUNTER — Other Ambulatory Visit (INDEPENDENT_AMBULATORY_CARE_PROVIDER_SITE_OTHER): Payer: Medicare HMO

## 2018-07-24 DIAGNOSIS — R7309 Other abnormal glucose: Secondary | ICD-10-CM | POA: Diagnosis not present

## 2018-07-24 LAB — CBC WITH DIFFERENTIAL/PLATELET
Basophils Absolute: 0.1 10*3/uL (ref 0.0–0.1)
Basophils Relative: 1.3 % (ref 0.0–3.0)
Eosinophils Absolute: 0.2 10*3/uL (ref 0.0–0.7)
Eosinophils Relative: 3.3 % (ref 0.0–5.0)
HCT: 40.4 % (ref 36.0–46.0)
Hemoglobin: 13.7 g/dL (ref 12.0–15.0)
Lymphocytes Relative: 50.5 % — ABNORMAL HIGH (ref 12.0–46.0)
Lymphs Abs: 3.8 10*3/uL (ref 0.7–4.0)
MCHC: 34 g/dL (ref 30.0–36.0)
MCV: 84.8 fl (ref 78.0–100.0)
Monocytes Absolute: 0.4 10*3/uL (ref 0.1–1.0)
Monocytes Relative: 5.5 % (ref 3.0–12.0)
Neutro Abs: 3 10*3/uL (ref 1.4–7.7)
Neutrophils Relative %: 39.4 % — ABNORMAL LOW (ref 43.0–77.0)
Platelets: 288 10*3/uL (ref 150.0–400.0)
RBC: 4.76 Mil/uL (ref 3.87–5.11)
RDW: 12.6 % (ref 11.5–15.5)
WBC: 7.6 10*3/uL (ref 4.0–10.5)

## 2018-07-24 LAB — COMPREHENSIVE METABOLIC PANEL
ALT: 17 U/L (ref 0–35)
AST: 14 U/L (ref 0–37)
Albumin: 4 g/dL (ref 3.5–5.2)
Alkaline Phosphatase: 120 U/L — ABNORMAL HIGH (ref 39–117)
BUN: 30 mg/dL — ABNORMAL HIGH (ref 6–23)
CO2: 27 mEq/L (ref 19–32)
Calcium: 9.6 mg/dL (ref 8.4–10.5)
Chloride: 101 mEq/L (ref 96–112)
Creatinine, Ser: 1.17 mg/dL (ref 0.40–1.20)
GFR: 55.36 mL/min — ABNORMAL LOW (ref >60.00)
Glucose, Bld: 123 mg/dL — ABNORMAL HIGH (ref 70–99)
Potassium: 4.1 mEq/L (ref 3.5–5.1)
Sodium: 137 mEq/L (ref 135–145)
Total Bilirubin: 0.3 mg/dL (ref 0.2–1.2)
Total Protein: 7.6 g/dL (ref 6.0–8.3)

## 2018-07-24 LAB — HEMOGLOBIN A1C: Hgb A1c MFr Bld: 5.4 % (ref 4.6–6.5)

## 2018-07-24 NOTE — Addendum Note (Signed)
Addended by: Francis Dowse T on: 07/24/2018 11:32 AM   Modules accepted: Orders

## 2018-07-26 ENCOUNTER — Other Ambulatory Visit: Payer: Self-pay

## 2018-07-26 ENCOUNTER — Encounter (HOSPITAL_COMMUNITY)
Admission: RE | Admit: 2018-07-26 | Discharge: 2018-07-26 | Disposition: A | Discharge status: Home / Self Care | Payer: Medicare HMO | Source: Ambulatory Visit | Attending: Endocrinology | Admitting: Endocrinology

## 2018-07-26 DIAGNOSIS — E0590 Thyrotoxicosis, unspecified without thyrotoxic crisis or storm: Secondary | ICD-10-CM | POA: Diagnosis not present

## 2018-08-07 ENCOUNTER — Ambulatory Visit: Payer: Medicare HMO | Admitting: Endocrinology

## 2018-08-08 ENCOUNTER — Encounter: Payer: Self-pay | Admitting: Endocrinology

## 2018-08-08 ENCOUNTER — Ambulatory Visit (INDEPENDENT_AMBULATORY_CARE_PROVIDER_SITE_OTHER): Payer: Medicare HMO | Admitting: Endocrinology

## 2018-08-08 DIAGNOSIS — E0590 Thyrotoxicosis, unspecified without thyrotoxic crisis or storm: Secondary | ICD-10-CM

## 2018-08-08 MED ORDER — METHIMAZOLE 10 MG PO TABS: 10.0000 mg | ORAL_TABLET | Freq: Every day | ORAL | 1 refills | Status: DC

## 2018-08-08 NOTE — Progress Notes (Signed)
Subjective:    Patient ID: Kari Coleman, female    DOB: 1951-01-18, 68 y.o.   MRN: [id number]  HPI  telehealth visit today via doxy video visit.  Alternatives to telehealth are presented to this patient, and the patient agrees to the telehealth visit. Pt is advised of the cost of the visit, and agrees to this, also.   Patient is at home, and I am at the office.   Persons attending the telehealth visit: the patient and I Pt returns for f/u of hyperthyroidism (dx'ed 2013; she was initially rx'ed tapazole, but she later chose RAI rx; [place] (2015) showed MNG; NM scan (2016) showed MNG with normal uptake (24%); Cytology (2016) showed [place] CATEGORY 2 and 3).  She took RAI 2 weeks ago.  pt states she feels well in general, except for heartburn. Past Medical History:  Diagnosis Date  . Cervical cancer ([place])   . Glaucoma 2017  . History of echocardiogram    Echo 9/16:  Mild LVH, EF 55-60%, no RWMA, Gr 1 DD, mild MR // Echo 1/17:  Mild LVH, EF 50-55%, Gr 1 DD, mild to mod MR, mild LAE  . Hypertension   . Hyperthyroidism    s/p RAI treatment  . 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 Tobacco abuse   . Urinary incontinence     Past Surgical History:  Procedure Laterality Date  . REPAIR OF COMPLEX TRACTION RETINAL DETACHMENT    . TOTAL ABDOMINAL HYSTERECTOMY W/ BILATERAL SALPINGOOPHORECTOMY      Social History   Socioeconomic History  . Marital status: Single    Spouse name: Not on file  . Number of children: 2  . Years of education: Not on file  . Highest education level: Not on file  Occupational History  . Occupation: CNA    Comment: [employer]  . Financial resource strain: Not on file  . Food insecurity:    Worry: Not on file    Inability: Not on file  . Transportation needs:    Medical: Not on file    Non-medical: Not on file  Tobacco Use  . Smoking status: Former Smoker    Packs/day: 0.50    Years: 48.00    Pack years: 24.00   Types: Cigarettes    Start date: 11/30/1968    Last attempt to quit: 10/31/2016    Years since quitting: 1.7  . Smokeless tobacco: Never Used  . Tobacco comment: did  not smoke more than .5 pack   Substance and Sexual Activity  . Alcohol use: No    Alcohol/week: 0.0 standard drinks    Comment: rarely   . Drug use: No  . Sexual activity: Yes  Lifestyle  . Physical activity:    Days per week: Not on file    Minutes per session: Not on file  . Stress: Not on file  Relationships  . Social connections:    Talks on phone: Not on file    Gets together: Not on file    Attends religious service: Not on file    Active member of club or organization: Not on file    Attends meetings of clubs or organizations: Not on file    Relationship status: Not on file  . Intimate partner violence:    Fear of current or ex partner: Not on file    Emotionally abused: Not on file    Physically abused: Not on file  Forced sexual activity: Not on file  Other Topics Concern  . Not on file  Social History Narrative   Caregiver   Daughter and grandson in household    Current Outpatient Medications on File Prior to Visit  Medication Sig Dispense Refill  . ADULT ASPIRIN EC LOW STRENGTH PO Take by mouth.    Marland Kitchen albuterol (PROVENTIL HFA;VENTOLIN HFA) 108 (90 Base) MCG/ACT inhaler Inhale 2 puffs into the lungs every 6 (six) hours as needed for wheezing or shortness of breath. 1 Inhaler 0  . amLODipine (NORVASC) 10 MG tablet Take 1 tablet by mouth once daily 90 tablet 0  . latanoprost (XALATAN) 0.005 % ophthalmic solution Place into both eyes daily. As directed    . lisinopril-hydrochlorothiazide (PRINZIDE,ZESTORETIC) 20-25 MG tablet Take 1 tablet by mouth once daily 90 tablet 0  . metoprolol succinate (TOPROL-XL) 100 MG 24 hr tablet Take 1 tablet (100 mg total) by mouth daily. Take with or immediately following a meal. 90 tablet 1  . Mirabegron (MYRBETRIQ PO) Take 50 mg by mouth daily.      No current  facility-administered medications on file prior to visit.     Allergies  Allergen Reactions  . Doxycycline Nausea Only  . Codeine Nausea And Vomiting  . Pollen Extract     Family History  Problem Relation Age of Onset  . Hypertension Mother   . Heart failure Mother   . Diabetes Mother   . Pancreatic cancer Father   . Colon cancer Maternal Aunt   . Cancer Sister   . Thyroid disease Paternal Aunt   . Cancer Paternal Aunt   . Thyroid disease Other        paternal side  . Cancer Maternal Grandfather   . Diabetes Paternal Grandmother   . Diabetes Maternal Uncle      Review of Systems Denies tremor and palpitations    Objective:   Physical Exam  Lab Results  Component Value Date   TSH <0.01 (L) 06/19/2018      Assessment & Plan:  Hyperthyroidism we discussed.  We'll resume tapazole while RAI is working.   Heartburn. We discussed.  This could be due to hyperthyroidism  Patient Instructions  I have sent a prescription to your pharmacy, to resume the methimazole.   If ever you have fever while taking methimazole, stop it and call us, even if the reason is obvious, because of the risk of a rare side-effect.  Please come back for a follow-up appointment in 1 month.

## 2018-08-08 NOTE — Patient Instructions (Addendum)
I have sent a prescription to your pharmacy, to resume the methimazole.   If ever you have fever while taking methimazole, stop it and call us, even if the reason is obvious, because of the risk of a rare side-effect.  Please come back for a follow-up appointment in 1 month.

## 2018-08-16 DIAGNOSIS — N3946 Mixed incontinence: Secondary | ICD-10-CM | POA: Diagnosis not present

## 2018-08-16 DIAGNOSIS — R350 Frequency of micturition: Secondary | ICD-10-CM | POA: Diagnosis not present

## 2018-09-09 ENCOUNTER — Ambulatory Visit: Payer: Medicare HMO | Admitting: Endocrinology

## 2018-09-10 ENCOUNTER — Encounter: Payer: Self-pay | Admitting: Physician Assistant

## 2018-09-10 ENCOUNTER — Ambulatory Visit (INDEPENDENT_AMBULATORY_CARE_PROVIDER_SITE_OTHER): Payer: Medicare HMO | Admitting: Physician Assistant

## 2018-09-10 VITALS — BP 161/76

## 2018-09-10 DIAGNOSIS — I10 Essential (primary) hypertension: Secondary | ICD-10-CM | POA: Diagnosis not present

## 2018-09-10 DIAGNOSIS — E0590 Thyrotoxicosis, unspecified without thyrotoxic crisis or storm: Secondary | ICD-10-CM

## 2018-09-10 DIAGNOSIS — R413 Other amnesia: Secondary | ICD-10-CM | POA: Diagnosis not present

## 2018-09-10 NOTE — Progress Notes (Signed)
Virtual Visit via Video   I connected with Kari Coleman on 09/10/18 at  9:20 AM EDT by a video enabled telemedicine application and verified that I am speaking with the correct person using two identifiers. Location patient: Home Location provider: [facility], Office Persons participating in the virtual visit: Kari, [name] PA-C, Kari Pickler, LPN   I discussed the limitations of evaluation and management by telemedicine and the availability of in person appointments. The patient expressed understanding and agreed to proceed.  I acted as a Education administrator for [employer], [employer], LPN  Subjective:   HPI:  Hypertension Pt following up today on blood pressure. She is checking blood pressures daily ranging 121/80-150/88. She is currently on: Norvasc 10 mg daily, Lisinopril-HCTZ 20-25 mg, Toprol XL 100 mg daily. Denies headaches but feels wobbly at times. Has blurred vision all the time in left eye, has a detached retina and cataract. Seeing eye doctor this month. Denies chest pain, occasionally SOB uses nebulizer.  Brain fog/HyperTSH Following up on brain fog, since las visit has been slightly improved, no worsening symptoms. Seeing Dr. Loanne Drilling tomorrow to discuss thyroid, had RAI treatment on 4/29. Has issues with sleeping, sleeping around 4 hours a night.   Wt Readings from Last 5 Encounters:  06/19/18 198 lb 9.6 oz (90.1 kg)  06/10/18 197 lb 8 oz (89.6 kg)  04/30/18 202 lb (91.6 kg)  04/29/18 198 lb 12.8 oz (90.2 kg)  04/25/18 202 lb (91.6 kg)      ROS: See pertinent positives and negatives per HPI.  Patient Active Problem List   Diagnosis Date Noted  . Chest discomfort 04/25/2018  . Ex-smoker 04/25/2018  . Primary osteoarthritis of right knee 01/04/2016  . Hypertension   . Hyperthyroidism     Social History   Tobacco Use  . Smoking status: Former Smoker    Packs/day: 0.50    Years: 48.00    Pack years: 24.00    Types: Cigarettes   Start date: 11/30/1968    Last attempt to quit: 10/31/2016    Years since quitting: 1.8  . Smokeless tobacco: Never Used  . Tobacco comment: did  not smoke more than .5 pack   Substance Use Topics  . Alcohol use: No    Alcohol/week: 0.0 standard drinks    Comment: rarely     Current Outpatient Medications:  .  ADULT ASPIRIN EC LOW STRENGTH PO, Take by mouth., Disp: , Rfl:  .  albuterol (PROVENTIL HFA;VENTOLIN HFA) 108 (90 Base) MCG/ACT inhaler, Inhale 2 puffs into the lungs every 6 (six) hours as needed for wheezing or shortness of breath., Disp: 1 Inhaler, Rfl: 0 .  amLODipine (NORVASC) 10 MG tablet, Take 1 tablet by mouth once daily, Disp: 90 tablet, Rfl: 0 .  latanoprost (XALATAN) 0.005 % ophthalmic solution, Place into both eyes daily. As directed, Disp: , Rfl:  .  lisinopril-hydrochlorothiazide (PRINZIDE,ZESTORETIC) 20-25 MG tablet, Take 1 tablet by mouth once daily, Disp: 90 tablet, Rfl: 0 .  methimazole (TAPAZOLE) 10 MG tablet, Take 1 tablet (10 mg total) by mouth daily., Disp: 90 tablet, Rfl: 1 .  metoprolol succinate (TOPROL-XL) 100 MG 24 hr tablet, Take 1 tablet (100 mg total) by mouth daily. Take with or immediately following a meal., Disp: 90 tablet, Rfl: 1 .  Mirabegron (MYRBETRIQ PO), Take 50 mg by mouth daily. , Disp: , Rfl:   Allergies  Allergen Reactions  . Doxycycline Nausea Only  . Codeine Nausea And Vomiting  .  Pollen Extract     Objective:   VITALS: Per patient if applicable, see vitals. GENERAL: Alert, appears well and in no acute distress. HEENT: Atraumatic, conjunctiva clear, no obvious abnormalities on inspection of external nose and ears. NECK: Normal movements of the head and neck. CARDIOPULMONARY: No increased WOB. Speaking in clear sentences. I:E ratio WNL.  MS: Moves all visible extremities without noticeable abnormality. PSYCH: Pleasant and cooperative, well-groomed. Speech normal rate and rhythm. Affect is appropriate. Insight and judgement are  appropriate. Attention is focused, linear, and appropriate.  NEURO: CN grossly intact. Oriented as arrived to appointment on time with no prompting. Moves both UE equally.  SKIN: No obvious lesions, wounds, erythema, or cyanosis noted on face or hands.  Assessment and Plan:   Diagnoses and all orders for this visit:  Essential hypertension Continues to have variable readings, but on average <140/90. Continue current regimen. Follow-up in 6 months, sooner if issues arise or BP consistently >140/90.  Memory changes Overall slight improvement, per her report. I do think her uncontrolled hyperthyroidism is affecting this. Continue to monitor at each visit.  Hyperthyroidism Management per Loanne Drilling, seeing him tomorrow.  . Reviewed expectations re: course of current medical issues. . Discussed self-management of symptoms. . Outlined signs and symptoms indicating need for more acute intervention. . Patient verbalized understanding and all questions were answered. Marland Kitchen Health Maintenance issues including appropriate healthy diet, exercise, and smoking avoidance were discussed with patient. . See orders for this visit as documented in the electronic medical record.  I discussed the assessment and treatment plan with the patient. The patient was provided an opportunity to ask questions and all were answered. The patient agreed with the plan and demonstrated an understanding of the instructions.   The patient was advised to call back or seek an in-person evaluation if the symptoms worsen or if the condition fails to improve as anticipated.   CMA or LPN served as scribe during this visit. History, Physical, and Plan performed by medical provider. The above documentation has been reviewed and is accurate and complete.  [place], [place] 09/10/2018

## 2018-09-12 ENCOUNTER — Ambulatory Visit (INDEPENDENT_AMBULATORY_CARE_PROVIDER_SITE_OTHER): Payer: Medicare HMO | Admitting: Endocrinology

## 2018-09-12 ENCOUNTER — Other Ambulatory Visit: Payer: Self-pay

## 2018-09-12 DIAGNOSIS — E0590 Thyrotoxicosis, unspecified without thyrotoxic crisis or storm: Secondary | ICD-10-CM

## 2018-09-12 NOTE — Patient Instructions (Signed)
Blood tests are requested for you today.  We'll let you know about the results.  Please come back for a follow-up appointment in 1 month.  

## 2018-09-12 NOTE — Progress Notes (Signed)
Subjective:    Patient ID: Kari Coleman, female    DOB: 16-Jan-1951, 68 y.o.   MRN: [id number]  HPI telehealth visit today via doxy video visit.  Alternatives to telehealth are presented to this patient, and the patient agrees to the telehealth visit.  Pt is advised of the cost of the visit, and agrees to this, also.   Patient is at home, and I am at the office.   Persons attending the telehealth visit: the patient and I Pt returns for f/u of hyperthyroidism (dx'ed 2013; she was initially rx'ed tapazole, but she later chose RAI rx; [place] (2015) showed MNG; NM scan (2016) showed MNG with normal uptake (24%); Cytology (2016) showed BETHESDA CATEGORY 2 and 3).  She took RAI 2 weeks ago.  pt takes tapazole while RAI is working.  pt states she feels well in general.  Specifically, she denies palpitations and tremor.   Past Medical History:  Diagnosis Date  . Cervical cancer ([place])   . Glaucoma 2017  . History of echocardiogram    Echo 9/16:  Mild LVH, EF 55-60%, no RWMA, Gr 1 DD, mild MR // Echo 1/17:  Mild LVH, EF 50-55%, Gr 1 DD, mild to mod MR, mild LAE  . Hypertension   . Hyperthyroidism    s/p RAI treatment  . 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 Tobacco abuse   . Urinary incontinence     Past Surgical History:  Procedure Laterality Date  . REPAIR OF COMPLEX TRACTION RETINAL DETACHMENT    . TOTAL ABDOMINAL HYSTERECTOMY W/ BILATERAL SALPINGOOPHORECTOMY      Social History   Socioeconomic History  . Marital status: Single    Spouse name: Not on file  . Number of children: 2  . Years of education: Not on file  . Highest education level: Not on file  Occupational History  . Occupation: CNA    Comment: [employer]  . Financial resource strain: Not on file  . Food insecurity    Worry: Not on file    Inability: Not on file  . Transportation needs    Medical: Not on file    Non-medical: Not on file  Tobacco Use  . Smoking status: Former  Smoker    Packs/day: 0.50    Years: 48.00    Pack years: 24.00    Types: Cigarettes    Start date: 11/30/1968    Quit date: 10/31/2016    Years since quitting: 1.8  . Smokeless tobacco: Never Used  . Tobacco comment: did  not smoke more than .5 pack   Substance and Sexual Activity  . Alcohol use: No    Alcohol/week: 0.0 standard drinks    Comment: rarely   . Drug use: No  . Sexual activity: Yes  Lifestyle  . Physical activity    Days per week: Not on file    Minutes per session: Not on file  . Stress: Not on file  Relationships  . Social Herbalist on phone: Not on file    Gets together: Not on file    Attends religious service: Not on file    Active member of club or organization: Not on file    Attends meetings of clubs or organizations: Not on file    Relationship status: Not on file  . Intimate partner violence    Fear of current or ex partner: Not on file    Emotionally  abused: Not on file    Physically abused: Not on file    Forced sexual activity: Not on file  Other Topics Concern  . Not on file  Social History Narrative   Caregiver   Daughter and grandson in household    Current Outpatient Medications on File Prior to Visit  Medication Sig Dispense Refill  . ADULT ASPIRIN EC LOW STRENGTH PO Take by mouth.    Marland Kitchen albuterol (PROVENTIL HFA;VENTOLIN HFA) 108 (90 Base) MCG/ACT inhaler Inhale 2 puffs into the lungs every 6 (six) hours as needed for wheezing or shortness of breath. 1 Inhaler 0  . amLODipine (NORVASC) 10 MG tablet Take 1 tablet by mouth once daily 90 tablet 0  . latanoprost (XALATAN) 0.005 % ophthalmic solution Place into both eyes daily. As directed    . lisinopril-hydrochlorothiazide (PRINZIDE,ZESTORETIC) 20-25 MG tablet Take 1 tablet by mouth once daily 90 tablet 0  . methimazole (TAPAZOLE) 10 MG tablet Take 1 tablet (10 mg total) by mouth daily. 90 tablet 1  . metoprolol succinate (TOPROL-XL) 100 MG 24 hr tablet Take 1 tablet (100 mg total)  by mouth daily. Take with or immediately following a meal. 90 tablet 1  . Mirabegron (MYRBETRIQ PO) Take 50 mg by mouth daily.      No current facility-administered medications on file prior to visit.     Allergies  Allergen Reactions  . Doxycycline Nausea Only  . Codeine Nausea And Vomiting  . Pollen Extract     Family History  Problem Relation Age of Onset  . Hypertension Mother   . Heart failure Mother   . Diabetes Mother   . Pancreatic cancer Father   . Colon cancer Maternal Aunt   . Cancer Sister   . Thyroid disease Paternal Aunt   . Cancer Paternal Aunt   . Thyroid disease Other        paternal side  . Cancer Maternal Grandfather   . Diabetes Paternal Grandmother   . Diabetes Maternal Uncle     Review of Systems Denies fever.     Objective:   Physical Exam       Assessment & Plan:  Hyperthyroidism: due for recheck MNG: vy video, I can't palpate.  Therefore, if TFT are improved, I won't know if the improvement is due to tapazole or RAI.   Patient Instructions  Blood tests are requested for you today.  We'll let you know about the results.   Please come back for a follow-up appointment in 1 month.

## 2018-09-13 ENCOUNTER — Other Ambulatory Visit (INDEPENDENT_AMBULATORY_CARE_PROVIDER_SITE_OTHER): Payer: Medicare HMO

## 2018-09-13 ENCOUNTER — Other Ambulatory Visit: Payer: Self-pay

## 2018-09-13 DIAGNOSIS — E0590 Thyrotoxicosis, unspecified without thyrotoxic crisis or storm: Secondary | ICD-10-CM | POA: Diagnosis not present

## 2018-09-13 LAB — T4, FREE: Free T4: 0.89 ng/dL (ref 0.60–1.60)

## 2018-09-13 LAB — TSH: TSH: <0.01 u[IU]/mL — ABNORMAL LOW (ref 0.35–4.50)

## 2018-09-23 ENCOUNTER — Other Ambulatory Visit: Payer: Self-pay | Admitting: Physician Assistant

## 2018-10-01 DIAGNOSIS — H2513 Age-related nuclear cataract, bilateral: Secondary | ICD-10-CM | POA: Diagnosis not present

## 2018-10-01 DIAGNOSIS — H401113 Primary open-angle glaucoma, right eye, severe stage: Secondary | ICD-10-CM | POA: Diagnosis not present

## 2018-10-08 ENCOUNTER — Other Ambulatory Visit: Payer: Self-pay

## 2018-10-08 DIAGNOSIS — H401131 Primary open-angle glaucoma, bilateral, mild stage: Secondary | ICD-10-CM | POA: Diagnosis not present

## 2018-10-10 ENCOUNTER — Other Ambulatory Visit: Payer: Self-pay

## 2018-10-10 ENCOUNTER — Ambulatory Visit (INDEPENDENT_AMBULATORY_CARE_PROVIDER_SITE_OTHER): Payer: Medicare HMO | Admitting: Endocrinology

## 2018-10-10 ENCOUNTER — Encounter: Payer: Self-pay | Admitting: Endocrinology

## 2018-10-10 VITALS — BP 182/98 | HR 86 | Ht 67.0 in | Wt 202.6 lb

## 2018-10-10 DIAGNOSIS — E0590 Thyrotoxicosis, unspecified without thyrotoxic crisis or storm: Secondary | ICD-10-CM | POA: Diagnosis not present

## 2018-10-10 LAB — TSH: TSH: 0.02 u[IU]/mL — ABNORMAL LOW (ref 0.35–4.50)

## 2018-10-10 LAB — T4, FREE: Free T4: 0.51 ng/dL — ABNORMAL LOW (ref 0.60–1.60)

## 2018-10-10 MED ORDER — METHIMAZOLE 10 MG PO TABS: 10.0000 mg | ORAL_TABLET | Freq: Two times a day (BID) | ORAL | 1 refills | Status: DC

## 2018-10-10 NOTE — Patient Instructions (Addendum)
Your blood pressure is high today.  Please see your primary care provider soon, to have it rechecked Blood tests are requested for you today.  We'll let you know about the results.   Please come back for a follow-up appointment in 5 weeks.

## 2018-10-10 NOTE — Progress Notes (Signed)
Subjective:    Patient ID: Kari Coleman, female    DOB: Jan 03, 1951, 68 y.o.   MRN: [id number]  HPI Pt returns for f/u of hyperthyroidism (dx'ed 2013; she was initially rx'ed tapazole, but she later chose RAI rx; [place] (2015) showed MNG; NM scan (2016) showed MNG with normal uptake (24%); Cytology (2016) showed BETHESDA CATEGORY 2 and 3).  She took RAI 4/20.  pt takes tapazole while RAI is working.  She says she takes 10-BID.  pt reports anxiety and fatigue.   Past Medical History:  Diagnosis Date  . Cervical cancer ([place])   . Glaucoma 2017  . History of echocardiogram    Echo 9/16:  Mild LVH, EF 55-60%, no RWMA, Gr 1 DD, mild MR // Echo 1/17:  Mild LVH, EF 50-55%, Gr 1 DD, mild to mod MR, mild LAE  . Hypertension   . Hyperthyroidism    s/p RAI treatment  . 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 Tobacco abuse   . Urinary incontinence     Past Surgical History:  Procedure Laterality Date  . REPAIR OF COMPLEX TRACTION RETINAL DETACHMENT    . TOTAL ABDOMINAL HYSTERECTOMY W/ BILATERAL SALPINGOOPHORECTOMY      Social History   Socioeconomic History  . Marital status: Single    Spouse name: Not on file  . Number of children: 2  . Years of education: Not on file  . Highest education level: Not on file  Occupational History  . Occupation: CNA    Comment: [employer]  . Financial resource strain: Not on file  . Food insecurity    Worry: Not on file    Inability: Not on file  . Transportation needs    Medical: Not on file    Non-medical: Not on file  Tobacco Use  . Smoking status: Former Smoker    Packs/day: 0.50    Years: 48.00    Pack years: 24.00    Types: Cigarettes    Start date: 11/30/1968    Quit date: 10/31/2016    Years since quitting: 1.9  . Smokeless tobacco: Never Used  . Tobacco comment: did  not smoke more than .5 pack   Substance and Sexual Activity  . Alcohol use: No    Alcohol/week: 0.0 standard drinks    Comment: rarely    . Drug use: No  . Sexual activity: Yes  Lifestyle  . Physical activity    Days per week: Not on file    Minutes per session: Not on file  . Stress: Not on file  Relationships  . Social Herbalist on phone: Not on file    Gets together: Not on file    Attends religious service: Not on file    Active member of club or organization: Not on file    Attends meetings of clubs or organizations: Not on file    Relationship status: Not on file  . Intimate partner violence    Fear of current or ex partner: Not on file    Emotionally abused: Not on file    Physically abused: Not on file    Forced sexual activity: Not on file  Other Topics Concern  . Not on file  Social History Narrative   Caregiver   Daughter and grandson in household    Current Outpatient Medications on File Prior to Visit  Medication Sig Dispense Refill  . ADULT ASPIRIN EC LOW STRENGTH  PO Take by mouth.    Marland Kitchen albuterol (PROVENTIL HFA;VENTOLIN HFA) 108 (90 Base) MCG/ACT inhaler Inhale 2 puffs into the lungs every 6 (six) hours as needed for wheezing or shortness of breath. 1 Inhaler 0  . amLODipine (NORVASC) 10 MG tablet Take 1 tablet by mouth once daily 90 tablet 0  . latanoprost (XALATAN) 0.005 % ophthalmic solution Place into both eyes daily. As directed    . lisinopril-hydrochlorothiazide (ZESTORETIC) 20-25 MG tablet Take 1 tablet by mouth once daily 90 tablet 0  . metoprolol succinate (TOPROL-XL) 100 MG 24 hr tablet Take 1 tablet (100 mg total) by mouth daily. Take with or immediately following a meal. 90 tablet 1  . Mirabegron (MYRBETRIQ PO) Take 50 mg by mouth daily.      No current facility-administered medications on file prior to visit.     Allergies  Allergen Reactions  . Doxycycline Nausea Only  . Codeine Nausea And Vomiting  . Pollen Extract     Family History  Problem Relation Age of Onset  . Hypertension Mother   . Heart failure Mother   . Diabetes Mother   . Pancreatic cancer Father    . Colon cancer Maternal Aunt   . Cancer Sister   . Thyroid disease Paternal Aunt   . Cancer Paternal Aunt   . Thyroid disease Other        paternal side  . Cancer Maternal Grandfather   . Diabetes Paternal Grandmother   . Diabetes Maternal Uncle     BP (!) 182/98 (BP Location: Left Arm, Patient Position: Sitting, Cuff Size: Normal)   Pulse 86   Ht 5\' 7"  (1.702 m)   Wt 202 lb 9.6 oz (91.9 kg)   SpO2 96%   BMI 31.73 kg/m    Review of Systems She also has insomnia    Objective:   Physical Exam VITAL SIGNS:  See vs page GENERAL: no distress NECK: There is no palpable thyroid enlargement.  No thyroid nodule is palpable.  No palpable lymphadenopathy at the anterior neck.     Lab Results  Component Value Date   TSH 0.02 (L) 10/10/2018      Assessment & Plan:  HTN: is noted today Hyperthyroidism: much better.  D/c tapazole.  Patient Instructions  Your blood pressure is high today.  Please see your primary care provider soon, to have it rechecked Blood tests are requested for you today.  We'll let you know about the results.   Please come back for a follow-up appointment in 5 weeks.

## 2018-10-17 ENCOUNTER — Other Ambulatory Visit: Payer: Self-pay

## 2018-10-21 ENCOUNTER — Ambulatory Visit: Payer: Medicare HMO | Admitting: Endocrinology

## 2018-10-21 ENCOUNTER — Other Ambulatory Visit: Payer: Self-pay

## 2018-10-21 ENCOUNTER — Encounter: Payer: Self-pay | Admitting: Endocrinology

## 2018-10-21 VITALS — BP 170/100 | HR 76 | Ht 67.0 in | Wt 204.4 lb

## 2018-10-21 DIAGNOSIS — E0590 Thyrotoxicosis, unspecified without thyrotoxic crisis or storm: Secondary | ICD-10-CM

## 2018-10-21 LAB — TSH: TSH: 0.16 u[IU]/mL — ABNORMAL LOW (ref 0.35–4.50)

## 2018-10-21 LAB — T4, FREE: Free T4: 0.46 ng/dL — ABNORMAL LOW (ref 0.60–1.60)

## 2018-10-21 NOTE — Patient Instructions (Addendum)
Your blood pressure is high today.  Please see your primary care provider soon, to have it rechecked.   Blood tests are requested for you today.  We'll let you know about the results.   Please come back for a follow-up appointment in 1 month.   

## 2018-10-21 NOTE — Progress Notes (Signed)
Subjective:    Patient ID: Kari Coleman, female    DOB: 1951-03-06, 68 y.o.   MRN: [id number]  HPI Pt returns for f/u of hyperthyroidism (dx'ed 2013; she was initially rx'ed tapazole, but she later chose RAI rx; [place] (2015) showed MNG; NM scan (2016) showed MNG with normal uptake (24%); Cytology (2016) showed BETHESDA CATEGORY 2 and 3; she took RAI 4/20; she took tapazole x a few more mos, but she stopped in early 7/20).  She has been off tapazole x 2 weeks.  pt states she feels well in general, except for mild heartburn.   Past Medical History:  Diagnosis Date  . Cervical cancer ([place])   . Glaucoma 2017  . History of echocardiogram    Echo 9/16:  Mild LVH, EF 55-60%, no RWMA, Gr 1 DD, mild MR // Echo 1/17:  Mild LVH, EF 50-55%, Gr 1 DD, mild to mod MR, mild LAE  . Hypertension   . Hyperthyroidism    s/p RAI treatment  . 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 Tobacco abuse   . Urinary incontinence     Past Surgical History:  Procedure Laterality Date  . REPAIR OF COMPLEX TRACTION RETINAL DETACHMENT    . TOTAL ABDOMINAL HYSTERECTOMY W/ BILATERAL SALPINGOOPHORECTOMY      Social History   Socioeconomic History  . Marital status: Single    Spouse name: Not on file  . Number of children: 2  . Years of education: Not on file  . Highest education level: Not on file  Occupational History  . Occupation: CNA    Comment: [employer]  . Financial resource strain: Not on file  . Food insecurity    Worry: Not on file    Inability: Not on file  . Transportation needs    Medical: Not on file    Non-medical: Not on file  Tobacco Use  . Smoking status: Former Smoker    Packs/day: 0.50    Years: 48.00    Pack years: 24.00    Types: Cigarettes    Start date: 11/30/1968    Quit date: 10/31/2016    Years since quitting: 1.9  . Smokeless tobacco: Never Used  . Tobacco comment: did  not smoke more than .5 pack   Substance and Sexual Activity  . Alcohol  use: No    Alcohol/week: 0.0 standard drinks    Comment: rarely   . Drug use: No  . Sexual activity: Yes  Lifestyle  . Physical activity    Days per week: Not on file    Minutes per session: Not on file  . Stress: Not on file  Relationships  . Social Herbalist on phone: Not on file    Gets together: Not on file    Attends religious service: Not on file    Active member of club or organization: Not on file    Attends meetings of clubs or organizations: Not on file    Relationship status: Not on file  . Intimate partner violence    Fear of current or ex partner: Not on file    Emotionally abused: Not on file    Physically abused: Not on file    Forced sexual activity: Not on file  Other Topics Concern  . Not on file  Social History Narrative   Caregiver   Daughter and grandson in household    Current Outpatient Medications on File Prior  to Visit  Medication Sig Dispense Refill  . ADULT ASPIRIN EC LOW STRENGTH PO Take by mouth.    Marland Kitchen albuterol (PROVENTIL HFA;VENTOLIN HFA) 108 (90 Base) MCG/ACT inhaler Inhale 2 puffs into the lungs every 6 (six) hours as needed for wheezing or shortness of breath. 1 Inhaler 0  . amLODipine (NORVASC) 10 MG tablet Take 1 tablet by mouth once daily 90 tablet 0  . latanoprost (XALATAN) 0.005 % ophthalmic solution Place into both eyes daily. As directed    . lisinopril-hydrochlorothiazide (ZESTORETIC) 20-25 MG tablet Take 1 tablet by mouth once daily 90 tablet 0  . metoprolol succinate (TOPROL-XL) 100 MG 24 hr tablet Take 1 tablet (100 mg total) by mouth daily. Take with or immediately following a meal. 90 tablet 1  . Mirabegron (MYRBETRIQ PO) Take 50 mg by mouth daily.      No current facility-administered medications on file prior to visit.     Allergies  Allergen Reactions  . Doxycycline Nausea Only  . Codeine Nausea And Vomiting  . Pollen Extract     Family History  Problem Relation Age of Onset  . Hypertension Mother   .  Heart failure Mother   . Diabetes Mother   . Pancreatic cancer Father   . Colon cancer Maternal Aunt   . Cancer Sister   . Thyroid disease Paternal Aunt   . Cancer Paternal Aunt   . Thyroid disease Other        paternal side  . Cancer Maternal Grandfather   . Diabetes Paternal Grandmother   . Diabetes Maternal Uncle     BP (!) 170/100 (BP Location: Left Arm, Patient Position: Sitting, Cuff Size: Normal)   Pulse 76   Ht 5\' 7"  (1.702 m)   Wt 204 lb 6.4 oz (92.7 kg)   SpO2 96%   BMI 32.01 kg/m    Review of Systems Denies fever.      Objective:   Physical Exam VITAL SIGNS:  See vs page GENERAL: no distress NECK: thyroid is slightly enlarged, with irreg surface.  No thyroid nodule is palpable.  No palpable lymphadenopathy at the anterior neck.      Lab Results  Component Value Date   TSH 0.16 (L) 10/21/2018      Assessment & Plan:  HTN: is noted today Hyperthyroidism: slightly worse off tapazole, but this is the time frame when RAI would be expected to work, so we won't resume tapazole now  Patient Instructions  Your blood pressure is high today.  Please see your primary care provider soon, to have it rechecked Blood tests are requested for you today.  We'll let you know about the results.   Please come back for a follow-up appointment in 1 month.

## 2018-10-23 DIAGNOSIS — H401131 Primary open-angle glaucoma, bilateral, mild stage: Secondary | ICD-10-CM | POA: Diagnosis not present

## 2018-11-04 ENCOUNTER — Ambulatory Visit: Payer: Self-pay

## 2018-11-04 ENCOUNTER — Other Ambulatory Visit: Payer: Medicare HMO

## 2018-11-04 ENCOUNTER — Ambulatory Visit (INDEPENDENT_AMBULATORY_CARE_PROVIDER_SITE_OTHER): Payer: Medicare HMO | Admitting: Family Medicine

## 2018-11-04 ENCOUNTER — Encounter: Payer: Self-pay | Admitting: Family Medicine

## 2018-11-04 ENCOUNTER — Other Ambulatory Visit: Payer: Self-pay

## 2018-11-04 VITALS — BP 108/80 | HR 77 | Ht 67.0 in | Wt 206.0 lb

## 2018-11-04 DIAGNOSIS — G8929 Other chronic pain: Secondary | ICD-10-CM

## 2018-11-04 DIAGNOSIS — M25561 Pain in right knee: Secondary | ICD-10-CM | POA: Diagnosis not present

## 2018-11-04 DIAGNOSIS — M1711 Unilateral primary osteoarthritis, right knee: Secondary | ICD-10-CM

## 2018-11-04 NOTE — Patient Instructions (Signed)
Good to see you.  Ice 20 minutes 2 times daily. Usually after activity and before bed. pennsaid pinkie amount topically 2 times daily as needed.  Turmeric 500mg  daily  Tart cherry extract 1200mg  at night Vitamin D 2000 IU daily  See me again in 6 weeks

## 2018-11-04 NOTE — Progress Notes (Signed)
[facility] [address] [place], [place] [address] Phone: [phone] Subjective:   Kari Coleman, am serving as a scribe for Kari Coleman.   CC: Knee pain follow-up  TFT:[id number]   04/2018, Kari Coleman note: She is doing significantly worse today and we will go ahead and repeat an aspiration and injection with the understanding that terminal treatment for this is a total knee arthroplasty.  She is interested in having this performed at the end of February which is greater than 6 weeks from now and I encouraged her to go ahead and schedule this with Kari Coleman at her convenience.  We will defer follow-up to him at this time.   Update 11/04/2018 Kari Coleman is a 68 y.o. female coming in with complaint of right knee pain. Patient states that she had pain relief from the injections that Kari Coleman did. Has had knee aspirated and this also helped. Constant pain. Is using IBU prn.  Patient states increasing instability.  Noticing swelling as well.    Past Medical History:  Diagnosis Date  . Cervical cancer ([place])   . Glaucoma 2017  . History of echocardiogram    Echo 9/16:  Mild LVH, EF 55-60%, Coleman RWMA, Gr 1 DD, mild MR // Echo 1/17:  Mild LVH, EF 50-55%, Gr 1 DD, mild to mod MR, mild LAE  . Hypertension   . Hyperthyroidism    s/p RAI treatment  . 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 Tobacco abuse   . Urinary incontinence    Past Surgical History:  Procedure Laterality Date  . REPAIR OF COMPLEX TRACTION RETINAL DETACHMENT    . TOTAL ABDOMINAL HYSTERECTOMY W/ BILATERAL SALPINGOOPHORECTOMY     Social History   Socioeconomic History  . Marital status: Single    Spouse name: Not on file  . Number of children: 2  . Years of education: Not on file  . Highest education level: Not on file  Occupational History  . 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 Financial resource strain: Not on file  . Food insecurity   Worry: Not on file    Inability: Not on file  . Transportation needs    Medical: Not on file    Non-medical: Not on file  Tobacco Use  . Smoking status: Former Smoker    Packs/day: 0.50    Years: 48.00    Pack years: 24.00    Types: Cigarettes    Start date: 11/30/1968    Quit date: 10/31/2016    Years since quitting: 2.0  . Smokeless tobacco: Never Used  . Tobacco comment: did  not smoke more than .5 pack   Substance and Sexual Activity  . Alcohol use: Coleman    Alcohol/week: 0.0 standard drinks    Comment: rarely   . Drug use: Coleman  . Sexual activity: Yes  Lifestyle  . Physical activity    Days per week: Not on file    Minutes per session: Not on file  . Stress: Not on file  Relationships  . Social Herbalist on phone: Not on file    Gets together: Not on file    Attends religious service: Not on file    Active member of club or organization: Not on file    Attends meetings of clubs or organizations: Not on file    Relationship status: Not on file  Other Topics Concern  . Not on file  Social History Narrative   Caregiver   Daughter and grandson in household   Allergies  Allergen Reactions  . Doxycycline Nausea Only  . Codeine Nausea And Vomiting  . Pollen Extract    Family History  Problem Relation Age of Onset  . Hypertension Mother   . Heart failure Mother   . Diabetes Mother   . Pancreatic cancer Father   . Colon cancer Maternal Aunt   . Cancer Sister   . Thyroid disease Paternal Aunt   . Cancer Paternal Aunt   . Thyroid disease Other        paternal side  . Cancer Maternal Grandfather   . Diabetes Paternal Grandmother   . Diabetes Maternal Uncle      Current Outpatient Medications (Cardiovascular):  .  amLODipine (NORVASC) 10 MG tablet, Take 1 tablet by mouth once daily .  lisinopril-hydrochlorothiazide (ZESTORETIC) 20-25 MG tablet, Take 1 tablet by mouth once daily .  metoprolol succinate (TOPROL-XL) 100 MG 24 hr tablet, Take 1 tablet (100  mg total) by mouth daily. Take with or immediately following a meal.  Current Outpatient Medications (Respiratory):  .  albuterol (PROVENTIL HFA;VENTOLIN HFA) 108 (90 Base) MCG/ACT inhaler, Inhale 2 puffs into the lungs every 6 (six) hours as needed for wheezing or shortness of breath.  Current Outpatient Medications (Analgesics):  Marland Kitchen  ADULT ASPIRIN EC LOW STRENGTH PO, Take by mouth.   Current Outpatient Medications (Other):  .  latanoprost (XALATAN) 0.005 % ophthalmic solution, Place into both eyes daily. As directed .  Mirabegron (MYRBETRIQ PO), Take 50 mg by mouth daily.     Past medical history, social, surgical and family history all reviewed in electronic medical record.  Coleman pertanent information unless stated regarding to the chief complaint.   Review of Systems:  Coleman headache, visual changes, nausea, vomiting, diarrhea, constipation, dizziness, abdominal pain, skin rash, fevers, chills, night sweats, weight loss, swollen lymph nodes, body aches, joint swelling, muscle aches, chest pain, shortness of breath, mood changes.   Objective  Blood pressure 108/80, pulse 77, height 5\' 7"  (1.702 m), weight 206 lb (93.4 kg), SpO2 98 %.    General: Coleman apparent distress alert and oriented x3 mood and affect normal, dressed appropriately.  HEENT: Pupils are unequal Respiratory: Patient's speak in full sentences and does not appear short of breath  Cardiovascular: Trace lower extremity edema, non tender, Coleman erythema  Skin: Warm dry intact with Coleman signs of infection or rash on extremities or on axial skeleton.  Abdomen: Soft nontender  Neuro: Cranial nerves II through XII are intact, neurovascularly intact in all extremities with 2+ DTRs and 2+ pulses.  Lymph: Coleman lymphadenopathy of posterior or anterior cervical chain or axillae bilaterally.  Gait severely antalgic MSK:  tender with full range of motion and good stability and symmetric strength and tone of shoulders, elbows, wrist, hip and  ankles bilaterally.  Knee: Right valgus deformity noted. Large thigh to calf ratio.  Tender to palpation over medial and PF joint line.  Limited range of motion lacking the last 10 degrees of extension instability with valgus force.  painful patellar compression. Patellar glide with moderate crepitus. Patellar and quadriceps tendons unremarkable. Hamstring and quadriceps strength is normal. Contralateral knee shows mild arthritic changes as well  Procedure: Real-time Ultrasound Guided Injection of right knee Device: GE Logiq Q7 Ultrasound guided injection is preferred based studies that show increased duration, increased effect, greater accuracy, decreased procedural pain, increased  response rate, and decreased cost with ultrasound guided versus blind injection.  Verbal informed consent obtained.  Time-out conducted.  Noted Coleman overlying erythema, induration, or other signs of local infection.  Skin prepped in a sterile fashion.  Local anesthesia: Topical Ethyl chloride.  With sterile technique and under real time ultrasound guidance: With a 22-gauge 2 inch needle patient was injected with 4 cc of 0.5% Marcaine and aspirated 30 cc of straw-colored fluid then injected 1 cc of Kenalog 40 mg/dL. This was from a superior lateral approach.  Completed without difficulty  Pain immediately resolved suggesting accurate placement of the medication.  Advised to call if fevers/chills, erythema, induration, drainage, or persistent bleeding.  Images permanently stored and available for review in the ultrasound unit.  Impression: Technically successful ultrasound guided injection.    Impression and Recommendations:     This case required medical decision making of moderate complexity. The above documentation has been reviewed and is accurate and complete Lyndal Pulley, DO       Note: This dictation was prepared with Dragon dictation along with smaller phrase technology. Any transcriptional  errors that result from this process are unintentional.

## 2018-11-04 NOTE — Assessment & Plan Note (Signed)
Patient given injection and tolerated the procedure well.  We discussed icing regimen and home exercises, compression and wearing the stability brace secondary to the thigh to calf ratio being abnormal.  Patient will follow-up with me again in 4 weeks and could be a candidate for Visco supplementation.

## 2018-11-05 LAB — SYNOVIAL CELL COUNT + DIFF, W/ CRYSTALS
Basophils, %: 0 %
Eosinophils-Synovial: 0 % (ref 0–2)
Lymphocytes-Synovial Fld: 52 % (ref 0–74)
Monocyte/Macrophage: 29 % (ref 0–69)
Neutrophil, Synovial: 0 % (ref 0–24)
Synoviocytes, %: 19 % — ABNORMAL HIGH (ref 0–15)
WBC, Synovial: 395 cells/uL — ABNORMAL HIGH (ref <150)

## 2018-11-13 ENCOUNTER — Other Ambulatory Visit: Payer: Self-pay

## 2018-11-13 ENCOUNTER — Ambulatory Visit (INDEPENDENT_AMBULATORY_CARE_PROVIDER_SITE_OTHER): Payer: Medicare HMO

## 2018-11-13 VITALS — BP 133/86

## 2018-11-13 DIAGNOSIS — Z0000 Encounter for general adult medical examination without abnormal findings: Secondary | ICD-10-CM | POA: Diagnosis not present

## 2018-11-13 NOTE — Progress Notes (Signed)
This visit is being conducted via phone call due to the COVID-19 pandemic. This patient has given me verbal consent via phone to conduct this visit, patient states they are participating from their home address. Some vital signs may be absent or patient reported.   Patient identification: identified by name, DOB, and current address.   Subjective:   Kari Coleman is a 68 y.o. female who presents for Medicare Annual (Subsequent) preventive examination.  Review of Systems:   Cardiac Risk Factors include: advanced age (>76men, >74 women);hypertension     Objective:     Vitals: BP 133/86   There is no height or weight on file to calculate BMI.  Advanced Directives 11/13/2018  Does Patient Have a Medical Advance Directive? No  Would patient like information on creating a medical advance directive? Yes (MAU/Ambulatory/Procedural Areas - Information given)    Tobacco Social History   Tobacco Use  Smoking Status Former Smoker  . Packs/day: 0.50  . Years: 48.00  . Pack years: 24.00  . Types: Cigarettes  . Start date: 11/30/1968  . Quit date: 10/31/2016  . Years since quitting: 2.0  Smokeless Tobacco Never Used  Tobacco Comment   did  not smoke more than .5 pack      Counseling given: Not Answered Comment: did  not smoke more than .5 pack    Clinical Intake:  Pre-visit preparation completed: Yes  Pain : 0-10 Pain Score: 2  Pain Type: Acute pain Pain Location: Knee(Treated by ortho)  Diabetes: No  How often do you need to have someone help you when you read instructions, pamphlets, or other written materials from your doctor or pharmacy?: 1 - Never  Information entered by :: Denman George LPN  Past Medical History:  Diagnosis Date  . Cervical cancer ([place])   . Glaucoma 2017  . History of echocardiogram    Echo 9/16:  Mild LVH, EF 55-60%, no RWMA, Gr 1 DD, mild MR // Echo 1/17:  Mild LVH, EF 50-55%, Gr 1 DD, mild to mod MR, mild LAE  . Hypertension   .  Hyperthyroidism    s/p RAI treatment  . 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 Tobacco abuse   . Urinary incontinence    Past Surgical History:  Procedure Laterality Date  . REPAIR OF COMPLEX TRACTION RETINAL DETACHMENT    . TOTAL ABDOMINAL HYSTERECTOMY W/ BILATERAL SALPINGOOPHORECTOMY     Family History  Problem Relation Age of Onset  . Hypertension Mother   . Heart failure Mother   . Diabetes Mother   . Pancreatic cancer Father   . Colon cancer Maternal Aunt   . Cancer Sister   . Thyroid disease Paternal Aunt   . Cancer Paternal Aunt   . Thyroid disease Other        paternal side  . Cancer Maternal Grandfather   . Diabetes Paternal Grandmother   . Diabetes Maternal Uncle    Social History   Socioeconomic History  . Marital status: Single    Spouse name: Not on file  . Number of children: 2  . Years of education: Not on file  . Highest education level: Not on file  Occupational History  . Occupation: CNA    Comment: [employer]  . Financial resource strain: Not on file  . Food insecurity    Worry: Not on file    Inability: Not on file  . Transportation needs  Medical: Not on file    Non-medical: Not on file  Tobacco Use  . Smoking status: Former Smoker    Packs/day: 0.50    Years: 48.00    Pack years: 24.00    Types: Cigarettes    Start date: 11/30/1968    Quit date: 10/31/2016    Years since quitting: 2.0  . Smokeless tobacco: Never Used  . Tobacco comment: did  not smoke more than .5 pack   Substance and Sexual Activity  . Alcohol use: No    Alcohol/week: 0.0 standard drinks    Comment: rarely   . Drug use: No  . Sexual activity: Yes  Lifestyle  . Physical activity    Days per week: Not on file    Minutes per session: Not on file  . Stress: Not on file  Relationships  . Social Herbalist on phone: Not on file    Gets together: Not on file    Attends religious service: Not on file    Active member of  club or organization: Not on file    Attends meetings of clubs or organizations: Not on file    Relationship status: Not on file  Other Topics Concern  . Not on file  Social History Narrative   Caregiver   Daughter and grandson in household    Outpatient Encounter Medications as of 11/13/2018  Medication Sig  . ADULT ASPIRIN EC LOW STRENGTH PO Take by mouth.  Marland Kitchen albuterol (PROVENTIL HFA;VENTOLIN HFA) 108 (90 Base) MCG/ACT inhaler Inhale 2 puffs into the lungs every 6 (six) hours as needed for wheezing or shortness of breath.  Marland Kitchen amLODipine (NORVASC) 10 MG tablet Take 1 tablet by mouth once daily  . latanoprost (XALATAN) 0.005 % ophthalmic solution Place into both eyes daily. As directed  . lisinopril-hydrochlorothiazide (ZESTORETIC) 20-25 MG tablet Take 1 tablet by mouth once daily  . metoprolol succinate (TOPROL-XL) 100 MG 24 hr tablet Take 1 tablet (100 mg total) by mouth daily. Take with or immediately following a meal.  . Mirabegron (MYRBETRIQ PO) Take 50 mg by mouth daily.    No facility-administered encounter medications on file as of 11/13/2018.     Activities of Daily Living In your present state of health, do you have any difficulty performing the following activities: 11/13/2018  Hearing? N  Vision? N  Difficulty concentrating or making decisions? N  Walking or climbing stairs? Y  Comment Intermittent due to knee pain  Dressing or bathing? N  Doing errands, shopping? N  Preparing Food and eating ? N  Using the Toilet? N  In the past six months, have you accidently leaked urine? N  Do you have problems with loss of bowel control? N  Managing your Medications? N  Managing your Finances? N  Some recent data might be hidden    Patient Care Team: Inda Coke, [place] as PCP - General (Physician Assistant) Gerda Diss, DO as Consulting Physician (Sports Medicine)    Assessment:   This is a routine wellness examination for Kari Coleman.  Exercise Activities and Dietary  recommendations Current Exercise Habits: The patient does not participate in regular exercise at present, Exercise limited by: orthopedic condition(s)  Goals    . Weight (lb) < 200 lb (90.7 kg)     Check out  online nutrition programs as GumSearch.nl and http://vang.com/; fit82me; Look for foods with "whole" wheat; bran; oatmeal etc Shot at the farmer's markets in season for fresher choices  Watch for "hydrogenated"  on the label of oils which are trans-fats.  Watch for "high fructose corn syrup" in snacks, yogurt or ketchup  Meats have less marbling; bright colored fruits and vegetables;  Canned; dump out liquid and wash vegetables. Be mindful of what we are eating  Portion control is essential to a health weight! Sit down; take a break and enjoy your meal; take smaller bites; put the fork down between bites;  It takes 20 minutes to get full; so check in with your fullness cues and stop eating when you start to fill full              Fall Risk Fall Risk  11/13/2018 11/08/2017 06/07/2017 01/31/2017  Falls in the past year? 0 No Yes Yes  Number falls in past yr: 0 - 2 or more 1  Injury with Fall? 0 - Yes No  Risk for fall due to : Impaired mobility - Other (Comment) -  Follow up Education provided - - -    Depression Screen PHQ 2/9 Scores 11/13/2018 11/08/2017 06/07/2017 06/07/2017  PHQ - 2 Score 0 1 0 0     Cognitive Function MMSE - Mini Mental State Exam 06/07/2017  Not completed: (No Data)    Immunization History  Administered Date(s) Administered  . Influenza, High Dose Seasonal PF 06/10/2018  . PPD Test 02/29/2016    Qualifies for Shingles Vaccine? Discussed and patient will check with pharmacy for coverage.  Patient education handout provided    Screening Tests Health Maintenance  Topic Date Due  . COLONOSCOPY  09/17/1998  . PNA vac Low Risk Adult (1 of 2 - PCV13) 09/16/2013  . INFLUENZA VACCINE  11/02/2018  . TETANUS/TDAP  06/10/2019 (Originally 09/17/1967)  .  MAMMOGRAM  07/04/2019  . DEXA SCAN  Completed  . Hepatitis C Screening  Completed    Cancer Screenings: Breast:  Up to date on Mammogram? Patient plans to schedule    Up to date of Bone Density/Dexa?Yes; last 07/03/17 Colorectal: Cologuard information provided to patient       Plan:  I have personally reviewed and addressed the Medicare Annual Wellness questionnaire and have noted the following in the patient's chart:  A. Medical and social history B. Use of alcohol, tobacco or illicit drugs  C. Current medications and supplements D. Functional ability and status E.  Nutritional status F.  Physical activity G. Advance directives H. List of other physicians I.  Hospitalizations, surgeries, and ER visits in previous 12 months J.  [place] such as hearing and vision if needed, cognitive and depression L. Referrals, records requested, and appointments-   In addition, I have reviewed and discussed with patient certain preventive protocols, quality metrics, and best practice recommendations. A written personalized care plan for preventive services as well as general preventive health recommendations were provided to patient.   Signed,  Denman George, LPN  Nurse Health Advisor   Nurse Notes: Patient is inquiring on if she can have sign off on a handicap placard due to knee pain.  I have forms available if approved

## 2018-11-13 NOTE — Patient Instructions (Addendum)
Kari Coleman , Thank you for taking time to come for your Medicare Wellness Visit. I appreciate your ongoing commitment to your health goals. Please review the following plan we discussed and let me know if I can assist you in the future.   Screening recommendations/referrals: Colorectal Screening:  Mammogram: last 07/04/17; repeat this year Bone Density: last 07/03/17; repeat 07/2019  Vision and Dental Exams: Recommended annual ophthalmology exams for early detection of glaucoma and other disorders of the eye Recommended annual dental exams for proper oral hygiene  Vaccinations: Influenza vaccine:  recommended this fall either at PCP office or through your local pharmacy  Pneumococcal vaccine: recommended  Tdap vaccine:Please call your insurance company to determine your out of pocket expense. You may also receive this vaccine at your local pharmacy or Health Dept. Shingles vaccine: Please call your insurance company to determine your out of pocket expense for the Shingrix vaccine. You may receive this vaccine at your local pharmacy.  Advanced directives: Advance directives discussed with you today. I have provided a copy for you to complete at home and have notarized. Once this is complete please bring a copy in to our office so we can scan it into your chart.  Goals: Recommend to remove any items from the home that may cause slips or trips.  Next appointment: Please schedule your Annual Wellness Visit with your Nurse Health Advisor in one year.  Preventive Care 43 Years and Older, Female Preventive care refers to lifestyle choices and visits with your health care provider that can promote health and wellness. What does preventive care include?  A yearly physical exam. This is also called an annual well check.  Dental exams once or twice a year.  Routine eye exams. Ask your health care provider how often you should have your eyes checked.  Personal lifestyle choices, including:  Daily  care of your teeth and gums.  Regular physical activity.  Eating a healthy diet.  Avoiding tobacco and drug use.  Limiting alcohol use.  Practicing safe sex.  Taking low-dose aspirin every day if recommended by your health care provider.  Taking vitamin and mineral supplements as recommended by your health care provider. What happens during an annual well check? The services and screenings done by your health care provider during your annual well check will depend on your age, overall health, lifestyle risk factors, and family history of disease. Counseling  Your health care provider may ask you questions about your:  Alcohol use.  Tobacco use.  Drug use.  Emotional well-being.  Home and relationship well-being.  Sexual activity.  Eating habits.  History of falls.  Memory and ability to understand (cognition).  Work and work Statistician.  Reproductive health. Screening  You may have the following tests or measurements:  Height, weight, and BMI.  Blood pressure.  Lipid and cholesterol levels. These may be checked every 5 years, or more frequently if you are over 32 years old.  Skin check.  Lung cancer screening. You may have this screening every year starting at age 43 if you have a 30-pack-year history of smoking and currently smoke or have quit within the past 15 years.  Fecal occult blood test (FOBT) of the stool. You may have this test every year starting at age 72.  Flexible sigmoidoscopy or colonoscopy. You may have a sigmoidoscopy every 5 years or a colonoscopy every 10 years starting at age 46.  Hepatitis C blood test.  Hepatitis B blood test.  Sexually transmitted disease (STD)  testing.  Diabetes screening. This is done by checking your blood sugar (glucose) after you have not eaten for a while (fasting). You may have this done every 1-3 years.  Bone density scan. This is done to screen for osteoporosis. You may have this done starting at age  53.  Mammogram. This may be done every 1-2 years. Talk to your health care provider about how often you should have regular mammograms. Talk with your health care provider about your test results, treatment options, and if necessary, the need for more tests. Vaccines  Your health care provider may recommend certain vaccines, such as:  Influenza vaccine. This is recommended every year.  Tetanus, diphtheria, and acellular pertussis (Tdap, Td) vaccine. You may need a Td booster every 10 years.  Zoster vaccine. You may need this after age 61.  Pneumococcal 13-valent conjugate (PCV13) vaccine. One dose is recommended after age 109.  Pneumococcal polysaccharide (PPSV23) vaccine. One dose is recommended after age 44. Talk to your health care provider about which screenings and vaccines you need and how often you need them. This information is not intended to replace advice given to you by your health care provider. Make sure you discuss any questions you have with your health care provider. Document Released: 04/16/2015 Document Revised: 12/08/2015 Document Reviewed: 01/19/2015 Elsevier Interactive Patient Education  2017 [employer] Prevention in the Home Falls can cause injuries. They can happen to people of all ages. There are many things you can do to make your home safe and to help prevent falls. What can I do on the outside of my home?  Regularly fix the edges of walkways and driveways and fix any cracks.  Remove anything that might make you trip as you walk through a door, such as a raised step or threshold.  Trim any bushes or trees on the path to your home.  Use bright outdoor lighting.  Clear any walking paths of anything that might make someone trip, such as rocks or tools.  Regularly check to see if handrails are loose or broken. Make sure that both sides of any steps have handrails.  Any raised decks and porches should have guardrails on the edges.  Have any  leaves, snow, or ice cleared regularly.  Use sand or salt on walking paths during winter.  Clean up any spills in your garage right away. This includes oil or grease spills. What can I do in the bathroom?  Use night lights.  Install grab bars by the toilet and in the tub and shower. Do not use towel bars as grab bars.  Use non-skid mats or decals in the tub or shower.  If you need to sit down in the shower, use a plastic, non-slip stool.  Keep the floor dry. Clean up any water that spills on the floor as soon as it happens.  Remove soap buildup in the tub or shower regularly.  Attach bath mats securely with double-sided non-slip rug tape.  Do not have throw rugs and other things on the floor that can make you trip. What can I do in the bedroom?  Use night lights.  Make sure that you have a light by your bed that is easy to reach.  Do not use any sheets or blankets that are too big for your bed. They should not hang down onto the floor.  Have a firm chair that has side arms. You can use this for support while you get dressed.  Do not  have throw rugs and other things on the floor that can make you trip. What can I do in the kitchen?  Clean up any spills right away.  Avoid walking on wet floors.  Keep items that you use a lot in easy-to-reach places.  If you need to reach something above you, use a strong step stool that has a grab bar.  Keep electrical cords out of the way.  Do not use floor polish or wax that makes floors slippery. If you must use wax, use non-skid floor wax.  Do not have throw rugs and other things on the floor that can make you trip. What can I do with my stairs?  Do not leave any items on the stairs.  Make sure that there are handrails on both sides of the stairs and use them. Fix handrails that are broken or loose. Make sure that handrails are as long as the stairways.  Check any carpeting to make sure that it is firmly attached to the stairs.  Fix any carpet that is loose or worn.  Avoid having throw rugs at the top or bottom of the stairs. If you do have throw rugs, attach them to the floor with carpet tape.  Make sure that you have a light switch at the top of the stairs and the bottom of the stairs. If you do not have them, ask someone to add them for you. What else can I do to help prevent falls?  Wear shoes that:  Do not have high heels.  Have rubber bottoms.  Are comfortable and fit you well.  Are closed at the toe. Do not wear sandals.  If you use a stepladder:  Make sure that it is fully opened. Do not climb a closed stepladder.  Make sure that both sides of the stepladder are locked into place.  Ask someone to hold it for you, if possible.  Clearly mark and make sure that you can see:  Any grab bars or handrails.  First and last steps.  Where the edge of each step is.  Use tools that help you move around (mobility aids) if they are needed. These include:  Canes.  Walkers.  Scooters.  Crutches.  Turn on the lights when you go into a dark area. Replace any light bulbs as soon as they burn out.  Set up your furniture so you have a clear path. Avoid moving your furniture around.  If any of your floors are uneven, fix them.  If there are any pets around you, be aware of where they are.  Review your medicines with your doctor. Some medicines can make you feel dizzy. This can increase your chance of falling. Ask your doctor what other things that you can do to help prevent falls. This information is not intended to replace advice given to you by your health care provider. Make sure you discuss any questions you have with your health care provider. Document Released: 01/14/2009 Document Revised: 08/26/2015 Document Reviewed: 04/24/2014 Elsevier Interactive Patient Education  2017 [employer].

## 2018-11-17 NOTE — Progress Notes (Signed)
I have reviewed documentation for AWV and Advance Care planning provided by [facility], I agree with documentation, I was immediately available for any questions. Zakayla Martinec, DO   

## 2018-11-20 ENCOUNTER — Other Ambulatory Visit: Payer: Self-pay

## 2018-11-20 NOTE — Progress Notes (Signed)
Noted.   Will request more information on if routing is different for virtual visits

## 2018-11-21 DIAGNOSIS — Z20828 Contact with and (suspected) exposure to other viral communicable diseases: Secondary | ICD-10-CM | POA: Diagnosis not present

## 2018-11-22 ENCOUNTER — Ambulatory Visit: Payer: Medicare HMO | Admitting: Endocrinology

## 2018-11-22 ENCOUNTER — Encounter: Payer: Self-pay | Admitting: Endocrinology

## 2018-11-22 ENCOUNTER — Other Ambulatory Visit: Payer: Self-pay

## 2018-11-22 VITALS — BP 146/80 | HR 72 | Ht 67.0 in | Wt 206.2 lb

## 2018-11-22 DIAGNOSIS — E0590 Thyrotoxicosis, unspecified without thyrotoxic crisis or storm: Secondary | ICD-10-CM

## 2018-11-22 LAB — TSH: TSH: 3.45 u[IU]/mL (ref 0.35–4.50)

## 2018-11-22 LAB — T4, FREE: Free T4: 0.51 ng/dL — ABNORMAL LOW (ref 0.60–1.60)

## 2018-11-22 NOTE — Patient Instructions (Addendum)
Your blood pressure is high today.  Please see your primary care provider soon, to have it rechecked.   Blood tests are requested for you today.  We'll let you know about the results.   Please come back for a follow-up appointment in 1 month.

## 2018-11-22 NOTE — Progress Notes (Signed)
Subjective:    Patient ID: Kari Coleman, female    DOB: Dec 29, 1950, 68 y.o.   MRN: [id number]  HPI Pt returns for f/u of hyperthyroidism (dx'ed 2013; she was initially rx'ed tapazole, but she later chose RAI rx; [place] (2015) showed MNG; [place] scan (2016) showed MNG with normal uptake (24%); Cytology (2016) showed B[place] CATEGORY 2 and 3; she took RAI rx 4/20; she took tapazole x a few more mos, but she stopped in early 7/20).  pt states she feels well in general.   Past Medical History:  Diagnosis Date  . Cervical cancer ([place])   . Glaucoma 2017  . History of echocardiogram    Echo 9/16:  Mild LVH, EF 55-60%, no RWMA, Gr 1 DD, mild MR // Echo 1/17:  Mild LVH, EF 50-55%, Gr 1 DD, mild to mod MR, mild LAE  . Hypertension   . Hyperthyroidism    s/p RAI treatment  . 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 Tobacco abuse   . Urinary incontinence     Past Surgical History:  Procedure Laterality Date  . REPAIR OF COMPLEX TRACTION RETINAL DETACHMENT    . TOTAL ABDOMINAL HYSTERECTOMY W/ BILATERAL SALPINGOOPHORECTOMY      Social History   Socioeconomic History  . Marital status: Single    Spouse name: Not on file  . Number of children: 2  . Years of education: Not on file  . Highest education level: Not on file  Occupational History  . Occupation: CNA    Comment: [employer]  . Financial resource strain: Not on file  . Food insecurity    Worry: Not on file    Inability: Not on file  . Transportation needs    Medical: Not on file    Non-medical: Not on file  Tobacco Use  . Smoking status: Former Smoker    Packs/day: 0.50    Years: 48.00    Pack years: 24.00    Types: Cigarettes    Start date: 11/30/1968    Quit date: 10/31/2016    Years since quitting: 2.0  . Smokeless tobacco: Never Used  . Tobacco comment: did  not smoke more than .5 pack   Substance and Sexual Activity  . Alcohol use: No    Alcohol/week: 0.0 standard drinks    Comment:  rarely   . Drug use: No  . Sexual activity: Yes  Lifestyle  . Physical activity    Days per week: Not on file    Minutes per session: Not on file  . Stress: Not on file  Relationships  . Social Herbalist on phone: Not on file    Gets together: Not on file    Attends religious service: Not on file    Active member of club or organization: Not on file    Attends meetings of clubs or organizations: Not on file    Relationship status: Not on file  . Intimate partner violence    Fear of current or ex partner: Not on file    Emotionally abused: Not on file    Physically abused: Not on file    Forced sexual activity: Not on file  Other Topics Concern  . Not on file  Social History Narrative   Caregiver   Daughter and grandson in household    Current Outpatient Medications on File Prior to Visit  Medication Sig Dispense Refill  . ADULT ASPIRIN EC  LOW STRENGTH PO Take by mouth.    Marland Kitchen albuterol (PROVENTIL HFA;VENTOLIN HFA) 108 (90 Base) MCG/ACT inhaler Inhale 2 puffs into the lungs every 6 (six) hours as needed for wheezing or shortness of breath. 1 Inhaler 0  . amLODipine (NORVASC) 10 MG tablet Take 1 tablet by mouth once daily 90 tablet 0  . latanoprost (XALATAN) 0.005 % ophthalmic solution Place into both eyes daily. As directed    . lisinopril-hydrochlorothiazide (ZESTORETIC) 20-25 MG tablet Take 1 tablet by mouth once daily 90 tablet 0  . metoprolol succinate (TOPROL-XL) 100 MG 24 hr tablet Take 1 tablet (100 mg total) by mouth daily. Take with or immediately following a meal. 90 tablet 1  . Mirabegron (MYRBETRIQ PO) Take 50 mg by mouth daily.      No current facility-administered medications on file prior to visit.     Allergies  Allergen Reactions  . Doxycycline Nausea Only  . Codeine Nausea And Vomiting  . Pollen Extract     Family History  Problem Relation Age of Onset  . Hypertension Mother   . Heart failure Mother   . Diabetes Mother   . Pancreatic  cancer Father   . Colon cancer Maternal Aunt   . Cancer Sister   . Thyroid disease Paternal Aunt   . Cancer Paternal Aunt   . Thyroid disease Other        paternal side  . Cancer Maternal Grandfather   . Diabetes Paternal Grandmother   . Diabetes Maternal Uncle     BP (!) 146/80 (BP Location: Left Arm, Patient Position: Sitting, Cuff Size: Large)   Pulse 72   Ht 5\' 7"  (1.702 m)   Wt 206 lb 3.2 oz (93.5 kg)   SpO2 95%   BMI 32.30 kg/m    Review of Systems She has gained 2 lbs.      Objective:   Physical Exam VITAL SIGNS:  See vs page GENERAL: no distress NECK: There is no palpable thyroid enlargement.  No thyroid nodule is palpable.  No palpable lymphadenopathy at the anterior neck.     Lab Results  Component Value Date   TSH 3.45 11/22/2018      Assessment & Plan:  HTN: is noted today Hyperthyroidism: resolved with RAI, but euthyroidism seldom persists after RAI  Patient Instructions  Your blood pressure is high today.  Please see your primary care provider soon, to have it rechecked.   Blood tests are requested for you today.  We'll let you know about the results.   Please come back for a follow-up appointment in 1 month.

## 2018-11-28 DIAGNOSIS — N3946 Mixed incontinence: Secondary | ICD-10-CM | POA: Diagnosis not present

## 2018-11-28 DIAGNOSIS — R351 Nocturia: Secondary | ICD-10-CM | POA: Diagnosis not present

## 2018-12-15 NOTE — Progress Notes (Signed)
[facility] [address], [place] [address] Phone: [phone] Subjective:   [name], am serving as a scribe for Dr. Hulan Saas.  I'm seeing this patient by the request  of:    CC: Right knee pain follow-up  [id number]   11/04/2018 Patient given injection and tolerated the procedure well.  We discussed icing regimen and home exercises, compression and wearing the stability brace secondary to the thigh to calf ratio being abnormal.  Patient will follow-up with me again in 4 weeks and could be a candidate for Visco supplementation.  Update 12/17/2018 Kari Coleman is a 68 y.o. female coming in with complaint of right knee pain. Patient states that she did have some relief from the injection. Has been having more weakness than pain in the knee recently. Has not had any falls. If she is sitting for to long her knee becomes stiff and then she feels the giving way sensation.     Past Medical History:  Diagnosis Date  . Cervical cancer ([place])   . Glaucoma 2017  . History of echocardiogram    Echo 9/16:  Mild LVH, EF 55-60%, no RWMA, Gr 1 DD, mild MR // Echo 1/17:  Mild LVH, EF 50-55%, Gr 1 DD, mild to mod MR, mild LAE  . Hypertension   . Hyperthyroidism    s/p RAI treatment  . 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 Tobacco abuse   . Urinary incontinence    Past Surgical History:  Procedure Laterality Date  . REPAIR OF COMPLEX TRACTION RETINAL DETACHMENT    . TOTAL ABDOMINAL HYSTERECTOMY W/ BILATERAL SALPINGOOPHORECTOMY     Social History   Socioeconomic History  . Marital status: Single    Spouse name: Not on file  . Number of children: 2  . Years of education: Not on file  . Highest education level: Not on file  Occupational History  . Occupation: CNA    Comment: [employer]  . Financial resource strain: Not on file  . Food insecurity    Worry: Not on file    Inability: Not on file  .  Transportation needs    Medical: Not on file    Non-medical: Not on file  Tobacco Use  . Smoking status: Former Smoker    Packs/day: 0.50    Years: 48.00    Pack years: 24.00    Types: Cigarettes    Start date: 11/30/1968    Quit date: 10/31/2016    Years since quitting: 2.1  . Smokeless tobacco: Never Used  . Tobacco comment: did  not smoke more than .5 pack   Substance and Sexual Activity  . Alcohol use: No    Alcohol/week: 0.0 standard drinks    Comment: rarely   . Drug use: No  . Sexual activity: Yes  Lifestyle  . Physical activity    Days per week: Not on file    Minutes per session: Not on file  . Stress: Not on file  Relationships  . Social Herbalist on phone: Not on file    Gets together: Not on file    Attends religious service: Not on file    Active member of club or organization: Not on file    Attends meetings of clubs or organizations: Not on file    Relationship status: Not on file  Other Topics Concern  . Not on file  Social History Narrative   Caregiver   Daughter and grandson in household   Allergies  Allergen Reactions  . Doxycycline Nausea Only  . Codeine Nausea And Vomiting  . Pollen Extract    Family History  Problem Relation Age of Onset  . Hypertension Mother   . Heart failure Mother   . Diabetes Mother   . Pancreatic cancer Father   . Colon cancer Maternal Aunt   . Cancer Sister   . Thyroid disease Paternal Aunt   . Cancer Paternal Aunt   . Thyroid disease Other        paternal side  . Cancer Maternal Grandfather   . Diabetes Paternal Grandmother   . Diabetes Maternal Uncle      Current Outpatient Medications (Cardiovascular):  .  amLODipine (NORVASC) 10 MG tablet, Take 1 tablet by mouth once daily .  lisinopril-hydrochlorothiazide (ZESTORETIC) 20-25 MG tablet, Take 1 tablet by mouth once daily .  metoprolol succinate (TOPROL-XL) 100 MG 24 hr tablet, Take 1 tablet (100 mg total) by mouth daily. Take with or immediately  following a meal.  Current Outpatient Medications (Respiratory):  .  albuterol (PROVENTIL HFA;VENTOLIN HFA) 108 (90 Base) MCG/ACT inhaler, Inhale 2 puffs into the lungs every 6 (six) hours as needed for wheezing or shortness of breath.  Current Outpatient Medications (Analgesics):  Marland Kitchen  ADULT ASPIRIN EC LOW STRENGTH PO, Take by mouth.   Current Outpatient Medications (Other):  .  latanoprost (XALATAN) 0.005 % ophthalmic solution, Place into both eyes daily. As directed .  Mirabegron (MYRBETRIQ PO), Take 50 mg by mouth daily.     Past medical history, social, surgical and family history all reviewed in electronic medical record.  No pertanent information unless stated regarding to the chief complaint.   Review of Systems:  No headache, visual changes, nausea, vomiting, diarrhea, constipation, dizziness, abdominal pain, skin rash, fevers, chills, night sweats, weight loss, swollen lymph nodes, body aches, joint swelling, muscle aches, chest pain, shortness of breath, mood changes.   Objective  Blood pressure (!) 132/98, pulse 84, height 5\' 7"  (1.702 m), weight 206 lb (93.4 kg), SpO2 97 %.    General: No apparent distress alert and oriented x3 mood and affect normal, dressed appropriately.  HEENT: Pupils equal, extraocular movements intact  Respiratory: Patient's speak in full sentences and does not appear short of breath  Cardiovascular: Trace lower extremity edema, non tender, no erythema  Skin: Warm dry intact with no signs of infection or rash on extremities or on axial skeleton.  Abdomen: Soft nontender  Neuro: Cranial nerves II through XII are intact, neurovascularly intact in all extremities with 2+ DTRs and 2+ pulses.  Lymph: No lymphadenopathy of posterior or anterior cervical chain or axillae bilaterally.  Gait antalgic walking with the aid of a cane MSK:  tender with full range of motion and good stability and symmetric strength and tone of shoulders, elbows, wrist, hip and  ankles bilaterally.  Knee: Right valgus deformity noted. Large thigh to calf ratio.  No effusion noted Tender to palpation over medial and PF joint line.  ROM full in flexion and extension and lower leg rotation. instability with valgus force.  painful patellar compression. Patellar glide with moderate crepitus. Patellar and quadriceps tendons unremarkable. Hamstring and quadriceps strength is normal. Contralateral knee shows mild arthritic changes  After informed written and verbal consent, patient was seated on exam table. Right knee was prepped with alcohol swab and utilizing anterolateral approach, patient's right knee space was injected wit  22 mg/mL of Monovisc (sodium hyaluronate) in a prefilled syringe was injected easily into the knee through a 22-gauge needle..Patient tolerated the procedure well without immediate complications.   Impression and Recommendations:     This case required medical decision making of moderate complexity. The above documentation has been reviewed and is accurate and complete Lyndal Pulley, DO       Note: This dictation was prepared with Dragon dictation along with smaller phrase technology. Any transcriptional errors that result from this process are unintentional.

## 2018-12-17 ENCOUNTER — Ambulatory Visit (INDEPENDENT_AMBULATORY_CARE_PROVIDER_SITE_OTHER): Payer: Medicare HMO | Admitting: Family Medicine

## 2018-12-17 ENCOUNTER — Other Ambulatory Visit: Payer: Self-pay

## 2018-12-17 ENCOUNTER — Encounter: Payer: Self-pay | Admitting: Family Medicine

## 2018-12-17 DIAGNOSIS — M1711 Unilateral primary osteoarthritis, right knee: Secondary | ICD-10-CM | POA: Diagnosis not present

## 2018-12-17 NOTE — Patient Instructions (Signed)
Monovisc injection today See me again in 6 weeks

## 2018-12-17 NOTE — Assessment & Plan Note (Signed)
Viscosupplementation done today.  Tolerated the procedure well.  Discussed icing regimen and home exercise, what activities of doing which wants to avoid.  Patient should increase activity as tolerated.  Follow-up again 6 weeks

## 2018-12-19 ENCOUNTER — Other Ambulatory Visit: Payer: Self-pay

## 2018-12-23 ENCOUNTER — Other Ambulatory Visit: Payer: Self-pay

## 2018-12-23 ENCOUNTER — Encounter: Payer: Self-pay | Admitting: Endocrinology

## 2018-12-23 ENCOUNTER — Ambulatory Visit (INDEPENDENT_AMBULATORY_CARE_PROVIDER_SITE_OTHER): Payer: Medicare HMO | Admitting: Endocrinology

## 2018-12-23 VITALS — BP 140/88 | HR 74 | Ht 67.0 in | Wt 208.6 lb

## 2018-12-23 DIAGNOSIS — E0590 Thyrotoxicosis, unspecified without thyrotoxic crisis or storm: Secondary | ICD-10-CM | POA: Diagnosis not present

## 2018-12-23 LAB — TSH: TSH: 2.97 u[IU]/mL (ref 0.35–4.50)

## 2018-12-23 LAB — T4, FREE: Free T4: 0.61 ng/dL (ref 0.60–1.60)

## 2018-12-23 NOTE — Patient Instructions (Signed)
Your blood pressure is high today.  Please see your primary care provider soon, to have it rechecked.   Blood tests are requested for you today.  We'll let you know about the results.   Please come back for a follow-up appointment in 1 month.

## 2018-12-23 NOTE — Progress Notes (Signed)
Subjective:    Patient ID: Kari Coleman, female    DOB: July 02, 1950, 68 y.o.   MRN: [id number]  HPI Pt returns for f/u of hyperthyroidism (dx'ed 2013; she was initially rx'ed tapazole, but she later chose RAI rx; [place] (2015) showed MNG; NM scan (2016) showed MNG with normal uptake (24%); Cytology (2016) showed BETH[place]DA CATEGORY 2 and 3; she took RAI rx 4/20; she took tapazole x a few more mos, but she stopped in early 7/20).  pt states she feels well in general.  Specifically, she denies palpitations and tremor Past Medical History:  Diagnosis Date  . Cervical cancer ([place])   . Glaucoma 2017  . History of echocardiogram    Echo 9/16:  Mild LVH, EF 55-60%, no RWMA, Gr 1 DD, mild MR // Echo 1/17:  Mild LVH, EF 50-55%, Gr 1 DD, mild to mod MR, mild LAE  . Hypertension   . Hyperthyroidism    s/p RAI treatment  . 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 Tobacco abuse   . Urinary incontinence     Past Surgical History:  Procedure Laterality Date  . REPAIR OF COMPLEX TRACTION RETINAL DETACHMENT    . TOTAL ABDOMINAL HYSTERECTOMY W/ BILATERAL SALPINGOOPHORECTOMY      Social History   Socioeconomic History  . Marital status: Single    Spouse name: Not on file  . Number of children: 2  . Years of education: Not on file  . Highest education level: Not on file  Occupational History  . Occupation: CNA    Comment: [employer]  . Financial resource strain: Not on file  . Food insecurity    Worry: Not on file    Inability: Not on file  . Transportation needs    Medical: Not on file    Non-medical: Not on file  Tobacco Use  . Smoking status: Former Smoker    Packs/day: 0.50    Years: 48.00    Pack years: 24.00    Types: Cigarettes    Start date: 11/30/1968    Quit date: 10/31/2016    Years since quitting: 2.1  . Smokeless tobacco: Never Used  . Tobacco comment: did  not smoke more than .5 pack   Substance and Sexual Activity  . Alcohol use: No   Alcohol/week: 0.0 standard drinks    Comment: rarely   . Drug use: No  . Sexual activity: Yes  Lifestyle  . Physical activity    Days per week: Not on file    Minutes per session: Not on file  . Stress: Not on file  Relationships  . Social Herbalist on phone: Not on file    Gets together: Not on file    Attends religious service: Not on file    Active member of club or organization: Not on file    Attends meetings of clubs or organizations: Not on file    Relationship status: Not on file  . Intimate partner violence    Fear of current or ex partner: Not on file    Emotionally abused: Not on file    Physically abused: Not on file    Forced sexual activity: Not on file  Other Topics Concern  . Not on file  Social History Narrative   Caregiver   Daughter and grandson in household    Current Outpatient Medications on File Prior to Visit  Medication Sig Dispense Refill  .  ADULT ASPIRIN EC LOW STRENGTH PO Take by mouth.    Marland Kitchen albuterol (PROVENTIL HFA;VENTOLIN HFA) 108 (90 Base) MCG/ACT inhaler Inhale 2 puffs into the lungs every 6 (six) hours as needed for wheezing or shortness of breath. 1 Inhaler 0  . amLODipine (NORVASC) 10 MG tablet Take 1 tablet by mouth once daily 90 tablet 0  . latanoprost (XALATAN) 0.005 % ophthalmic solution Place into both eyes daily. As directed    . lisinopril-hydrochlorothiazide (ZESTORETIC) 20-25 MG tablet Take 1 tablet by mouth once daily 90 tablet 0  . metoprolol succinate (TOPROL-XL) 100 MG 24 hr tablet Take 1 tablet (100 mg total) by mouth daily. Take with or immediately following a meal. 90 tablet 1  . Mirabegron (MYRBETRIQ PO) Take 50 mg by mouth daily.      No current facility-administered medications on file prior to visit.     Allergies  Allergen Reactions  . Doxycycline Nausea Only  . Codeine Nausea And Vomiting  . Pollen Extract     Family History  Problem Relation Age of Onset  . Hypertension Mother   . Heart failure  Mother   . Diabetes Mother   . Pancreatic cancer Father   . Colon cancer Maternal Aunt   . Cancer Sister   . Thyroid disease Paternal Aunt   . Cancer Paternal Aunt   . Thyroid disease Other        paternal side  . Cancer Maternal Grandfather   . Diabetes Paternal Grandmother   . Diabetes Maternal Uncle     BP 140/88 (BP Location: Left Arm, Patient Position: Sitting, Cuff Size: Large)   Pulse 74   Ht 5\' 7"  (1.702 m)   Wt 208 lb 9.6 oz (94.6 kg)   SpO2 98%   BMI 32.67 kg/m    Review of Systems Denies excessive diaphoresis    Objective:   Physical Exam VITAL SIGNS:  See vs page GENERAL: no distress NECK: There is no palpable thyroid enlargement.  No thyroid nodule is palpable.  No palpable lymphadenopathy at the anterior neck.      Lab Results  Component Value Date   TSH 2.97 12/23/2018      Assessment & Plan:  HTN: is noted today.  Hyperthyroidism: still back to normal after RAI rx.  However, euthyroidism usually does not persist.  Patient Instructions  Your blood pressure is high today.  Please see your primary care provider soon, to have it rechecked.   Blood tests are requested for you today.  We'll let you know about the results.   Please come back for a follow-up appointment in 1 month.

## 2019-01-03 ENCOUNTER — Telehealth: Payer: Self-pay | Admitting: *Deleted

## 2019-01-03 NOTE — Telephone Encounter (Signed)
Could be a flare. Usuually if it happens only happens for first 24 hours.  Ice 20 minutes 2 times daily. Usually after activity and before bed. And then should be getting better very soon.  Because of the weekend if worsening then would need to go to urgent care.  Call again on Monday

## 2019-01-03 NOTE — Telephone Encounter (Signed)
Called patient. Could not leave message due to mailbox being full.

## 2019-01-03 NOTE — Telephone Encounter (Signed)
Patient is returning call.  Patient call back [phone]

## 2019-01-03 NOTE — Telephone Encounter (Signed)
Pt left msg stating that she received Monovisc injection on 12/17/18 & yesterday she was in a lot of pain with her knee. Pt stated her knee is swollen and is having difficulty walking. She would like to know what Dr. Tamala Julian recommends.

## 2019-01-03 NOTE — Telephone Encounter (Signed)
Returned patient call. She will ice, take it easy and call us back Monday.

## 2019-01-09 DIAGNOSIS — N3941 Urge incontinence: Secondary | ICD-10-CM | POA: Diagnosis not present

## 2019-01-09 DIAGNOSIS — R350 Frequency of micturition: Secondary | ICD-10-CM | POA: Diagnosis not present

## 2019-01-14 DIAGNOSIS — R350 Frequency of micturition: Secondary | ICD-10-CM | POA: Diagnosis not present

## 2019-01-14 DIAGNOSIS — N3946 Mixed incontinence: Secondary | ICD-10-CM | POA: Diagnosis not present

## 2019-01-17 ENCOUNTER — Other Ambulatory Visit: Payer: Self-pay

## 2019-01-21 ENCOUNTER — Other Ambulatory Visit: Payer: Self-pay

## 2019-01-21 ENCOUNTER — Ambulatory Visit (INDEPENDENT_AMBULATORY_CARE_PROVIDER_SITE_OTHER): Payer: Medicare HMO | Admitting: Endocrinology

## 2019-01-21 ENCOUNTER — Encounter: Payer: Self-pay | Admitting: Endocrinology

## 2019-01-21 VITALS — BP 124/80 | HR 75 | Ht 67.0 in | Wt 209.4 lb

## 2019-01-21 DIAGNOSIS — E0590 Thyrotoxicosis, unspecified without thyrotoxic crisis or storm: Secondary | ICD-10-CM | POA: Diagnosis not present

## 2019-01-21 LAB — T4, FREE: Free T4: 0.49 ng/dL — ABNORMAL LOW (ref 0.60–1.60)

## 2019-01-21 LAB — TSH: TSH: 4.68 u[IU]/mL — ABNORMAL HIGH (ref 0.35–4.50)

## 2019-01-21 MED ORDER — LEVOTHYROXINE SODIUM 50 MCG PO TABS: 50.0000 ug | ORAL_TABLET | Freq: Every day | ORAL | 0 refills | Status: DC

## 2019-01-21 NOTE — Progress Notes (Signed)
Subjective:    Patient ID: Kari Coleman, female    DOB: Oct 07, 1950, 68 y.o.   MRN: [id number]  HPI Pt returns for f/u of hyperthyroidism (dx'ed 2013; she was initially rx'ed tapazole, but she later chose RAI rx; [place] (2015) showed MNG; NM scan (2016) showed MNG with normal uptake (24%); Cytology (2016) showed BETHESDA CATEGORY 2 and 3; she took RAI rx 4/20; she took tapazole x a few more mos, but she stopped in early 7/20).  pt states she feels well in general.  Specifically, she has slight palpitations in the chest, but no assoc tremor.   Past Medical History:  Diagnosis Date  . Cervical cancer ([place])   . Glaucoma 2017  . History of echocardiogram    Echo 9/16:  Mild LVH, EF 55-60%, no RWMA, Gr 1 DD, mild MR // Echo 1/17:  Mild LVH, EF 50-55%, Gr 1 DD, mild to mod MR, mild LAE  . Hypertension   . Hyperthyroidism    s/p RAI treatment  . 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 Tobacco abuse   . Urinary incontinence     Past Surgical History:  Procedure Laterality Date  . REPAIR OF COMPLEX TRACTION RETINAL DETACHMENT    . TOTAL ABDOMINAL HYSTERECTOMY W/ BILATERAL SALPINGOOPHORECTOMY      Social History   Socioeconomic History  . Marital status: Single    Spouse name: Not on file  . Number of children: 2  . Years of education: Not on file  . Highest education level: Not on file  Occupational History  . Occupation: CNA    Comment: [employer]  . Financial resource strain: Not on file  . Food insecurity    Worry: Not on file    Inability: Not on file  . Transportation needs    Medical: Not on file    Non-medical: Not on file  Tobacco Use  . Smoking status: Former Smoker    Packs/day: 0.50    Years: 48.00    Pack years: 24.00    Types: Cigarettes    Start date: 11/30/1968    Quit date: 10/31/2016    Years since quitting: 2.2  . Smokeless tobacco: Never Used  . Tobacco comment: did  not smoke more than .5 pack   Substance and Sexual  Activity  . Alcohol use: No    Alcohol/week: 0.0 standard drinks    Comment: rarely   . Drug use: No  . Sexual activity: Yes  Lifestyle  . Physical activity    Days per week: Not on file    Minutes per session: Not on file  . Stress: Not on file  Relationships  . Social Herbalist on phone: Not on file    Gets together: Not on file    Attends religious service: Not on file    Active member of club or organization: Not on file    Attends meetings of clubs or organizations: Not on file    Relationship status: Not on file  . Intimate partner violence    Fear of current or ex partner: Not on file    Emotionally abused: Not on file    Physically abused: Not on file    Forced sexual activity: Not on file  Other Topics Concern  . Not on file  Social History Narrative   Caregiver   Daughter and grandson in household    Current Outpatient Medications on File  Prior to Visit  Medication Sig Dispense Refill  . ADULT ASPIRIN EC LOW STRENGTH PO Take by mouth.    Marland Kitchen amLODipine (NORVASC) 10 MG tablet Take 1 tablet by mouth once daily 90 tablet 0  . latanoprost (XALATAN) 0.005 % ophthalmic solution Place into both eyes daily. As directed    . lisinopril-hydrochlorothiazide (ZESTORETIC) 20-25 MG tablet Take 1 tablet by mouth once daily 90 tablet 0  . metoprolol succinate (TOPROL-XL) 100 MG 24 hr tablet Take 1 tablet (100 mg total) by mouth daily. Take with or immediately following a meal. 90 tablet 1  . Mirabegron (MYRBETRIQ PO) Take 50 mg by mouth daily.      No current facility-administered medications on file prior to visit.     Allergies  Allergen Reactions  . Doxycycline Nausea Only  . Codeine Nausea And Vomiting  . Pollen Extract     Family History  Problem Relation Age of Onset  . Hypertension Mother   . Heart failure Mother   . Diabetes Mother   . Pancreatic cancer Father   . Colon cancer Maternal Aunt   . Cancer Sister   . Thyroid disease Paternal Aunt   .  Cancer Paternal Aunt   . Thyroid disease Other        paternal side  . Cancer Maternal Grandfather   . Diabetes Paternal Grandmother   . Diabetes Maternal Uncle     BP 124/80 (BP Location: Left Arm, Patient Position: Sitting, Cuff Size: Large)   Pulse 75   Ht 5\' 7"  (1.702 m)   Wt 209 lb 6.4 oz (95 kg)   SpO2 99%   BMI 32.80 kg/m    Review of Systems Denies sob and ankle swelling.      Objective:   Physical Exam VITAL SIGNS:  See vs page GENERAL: no distress NECK: There is no palpable thyroid enlargement.  No thyroid nodule is palpable.  No palpable lymphadenopathy at the anterior neck.    Lab Results  Component Value Date   TSH 4.68 (H) 01/21/2019       Assessment & Plan:  Post-RAI hypothyroidism, new.  I have sent a prescription to your pharmacy, for synthroid. Please come back for a follow-up appointment in 2 months.

## 2019-01-21 NOTE — Patient Instructions (Addendum)
Blood tests are requested for you today.  We'll let you know about the results.  Please come back for a follow-up appointment in 2 months.   

## 2019-01-22 ENCOUNTER — Ambulatory Visit (INDEPENDENT_AMBULATORY_CARE_PROVIDER_SITE_OTHER): Payer: Medicare HMO | Admitting: Family Medicine

## 2019-01-22 ENCOUNTER — Encounter: Payer: Self-pay | Admitting: Family Medicine

## 2019-01-22 VITALS — BP 132/84 | HR 72 | Ht 67.0 in | Wt 207.2 lb

## 2019-01-22 DIAGNOSIS — M25561 Pain in right knee: Secondary | ICD-10-CM | POA: Diagnosis not present

## 2019-01-22 DIAGNOSIS — G8929 Other chronic pain: Secondary | ICD-10-CM | POA: Diagnosis not present

## 2019-01-22 DIAGNOSIS — M1711 Unilateral primary osteoarthritis, right knee: Secondary | ICD-10-CM

## 2019-01-22 NOTE — Progress Notes (Signed)
[facility] [address], [place] [address] Phone: [phone] Subjective:      CC: R Knee pain follow-up  [id number]    I, Wendy Poet, LAT, ATC, am serving as scribe for Dr. Hulan Saas.  12/17/18: Viscosupplementation done today.  Tolerated the procedure well.  Discussed icing regimen and home exercise, what activities of doing which wants to avoid.  Patient should increase activity as tolerated.  Follow-up again 6 weeks  Update- 01/27/19 ARRETTA ORIOL is a 68 y.o. female coming in with complaint of R knee pain. She states that she feels slightly worse.  She reports that she had an episode last weekend where the knee felt like it was swollen and she applied ice throughout the weekend.  She states that she is not really taking any medication specifically for her R knee.  Pt reports minimal mechanical symptoms and denies any numbness/tingling.    Patient was given steroid injection December 17, 2018.  Past Medical History:  Diagnosis Date  . Cervical cancer ([place])   . Glaucoma 2017  . History of echocardiogram    Echo 9/16:  Mild LVH, EF 55-60%, no RWMA, Gr 1 DD, mild MR // Echo 1/17:  Mild LVH, EF 50-55%, Gr 1 DD, mild to mod MR, mild LAE  . Hypertension   . Hyperthyroidism    s/p RAI treatment  . 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 Tobacco abuse   . Urinary incontinence    Past Surgical History:  Procedure Laterality Date  . REPAIR OF COMPLEX TRACTION RETINAL DETACHMENT    . TOTAL ABDOMINAL HYSTERECTOMY W/ BILATERAL SALPINGOOPHORECTOMY     Social History   Socioeconomic History  . Marital status: Single    Spouse name: Not on file  . Number of children: 2  . Years of education: Not on file  . Highest education level: Not on file  Occupational History  . Occupation: CNA    Comment: [employer]  . Financial resource strain: Not on file  . Food insecurity    Worry: Not on file   Inability: Not on file  . Transportation needs    Medical: Not on file    Non-medical: Not on file  Tobacco Use  . Smoking status: Former Smoker    Packs/day: 0.50    Years: 48.00    Pack years: 24.00    Types: Cigarettes    Start date: 11/30/1968    Quit date: 10/31/2016    Years since quitting: 2.2  . Smokeless tobacco: Never Used  . Tobacco comment: did  not smoke more than .5 pack   Substance and Sexual Activity  . Alcohol use: No    Alcohol/week: 0.0 standard drinks    Comment: rarely   . Drug use: No  . Sexual activity: Yes  Lifestyle  . Physical activity    Days per week: Not on file    Minutes per session: Not on file  . Stress: Not on file  Relationships  . Social Herbalist on phone: Not on file    Gets together: Not on file    Attends religious service: Not on file    Active member of club or organization: Not on file    Attends meetings of clubs or organizations: Not on file    Relationship status: Not on file  Other Topics Concern  . Not on file  Social History  Narrative   Caregiver   Daughter and grandson in household   Allergies  Allergen Reactions  . Doxycycline Nausea Only  . Codeine Nausea And Vomiting  . Pollen Extract    Family History  Problem Relation Age of Onset  . Hypertension Mother   . Heart failure Mother   . Diabetes Mother   . Pancreatic cancer Father   . Colon cancer Maternal Aunt   . Cancer Sister   . Thyroid disease Paternal Aunt   . Cancer Paternal Aunt   . Thyroid disease Other        paternal side  . Cancer Maternal Grandfather   . Diabetes Paternal Grandmother   . Diabetes Maternal Uncle     Current Outpatient Medications (Endocrine & Metabolic):  .  levothyroxine (SYNTHROID) 50 MCG tablet, Take 1 tablet (50 mcg total) by mouth daily before breakfast.  Current Outpatient Medications (Cardiovascular):  .  amLODipine (NORVASC) 10 MG tablet, Take 1 tablet by mouth once daily .  lisinopril-hydrochlorothiazide  (ZESTORETIC) 20-25 MG tablet, Take 1 tablet by mouth once daily .  metoprolol succinate (TOPROL-XL) 100 MG 24 hr tablet, Take 1 tablet (100 mg total) by mouth daily. Take with or immediately following a meal.  Current Outpatient Medications (Respiratory):  .  albuterol (PROVENTIL HFA;VENTOLIN HFA) 108 (90 Base) MCG/ACT inhaler, Inhale 2 puffs into the lungs every 6 (six) hours as needed for wheezing or shortness of breath.  Current Outpatient Medications (Analgesics):  Marland Kitchen  ADULT ASPIRIN EC LOW STRENGTH PO, Take by mouth.   Current Outpatient Medications (Other):  .  latanoprost (XALATAN) 0.005 % ophthalmic solution, Place into both eyes daily. As directed .  Mirabegron (MYRBETRIQ PO), Take 50 mg by mouth daily.     Past medical history, social, surgical and family history all reviewed in electronic medical record.  No pertanent information unless stated regarding to the chief complaint.   Review of Systems:  No headache, visual changes, nausea, vomiting, diarrhea, constipation, dizziness, abdominal pain, skin rash, fevers, chills, night sweats, weight loss, swollen lymph nodes, body aches, joint swelling, , chest pain, shortness of breath, mood changes.  Positive muscle aches  Objective  Blood pressure 132/84, pulse 72, height 5\' 7"  (1.702 m), weight 207 lb 3.2 oz (94 kg), SpO2 95 %.    General: No apparent distress alert and oriented x3 mood and affect normal, dressed appropriately.  HEENT: Pupils equal, extraocular movements intact  Respiratory: Patient's speak in full sentences and does not appear short of breath  Cardiovascular: 1+ lower extremity edema, non tender, no erythema  Skin: Warm dry intact with no signs of infection or rash on extremities or on axial skeleton.  Abdomen: Soft nontender  Neuro: Cranial nerves II through XII are intact, neurovascularly intact in all extremities with 2+ DTRs and 2+ pulses.  Lymph: No lymphadenopathy of posterior or anterior cervical chain or  axillae bilaterally.  Gait antalgic gait aide of a cane  MSK:  tender with full range of motion and good stability and symmetric strength and tone of shoulders, elbows, wrist, hip, and ankles bilaterally.  Knee: Right valgus deformity noted. Large thigh to calf ratio.  Effusion noted Tender to palpation over medial and PF joint line.  ROM full in flexion and extension and lower leg rotation. instability with valgus force.  painful patellar compression. Patellar glide with moderate crepitus. Patellar and quadriceps tendons unremarkable. Hamstring and quadriceps strength is normal. Contralateral knee shows mild arthritic changes     Impression and Recommendations:  The above documentation has been reviewed and is accurate and complete Lyndal Pulley, DO       Note: This dictation was prepared with Dragon dictation along with smaller phrase technology. Any transcriptional errors that result from this process are unintentional.

## 2019-01-22 NOTE — Assessment & Plan Note (Signed)
Patient does have significant arthritic changes and has failed all conservative therapy including steroid injections viscosupplementation and continues to have instability of the knee.  Patient is willing to consider the possibility of a knee replacement.  Patient will be referred to an orthopedic surgery to discuss further.

## 2019-01-22 NOTE — Patient Instructions (Signed)
Dr. Darin [facility] Call me if you have questions

## 2019-01-23 ENCOUNTER — Other Ambulatory Visit: Payer: Self-pay | Admitting: *Deleted

## 2019-01-23 MED ORDER — ALBUTEROL SULFATE HFA 108 (90 BASE) MCG/ACT IN AERS: 2.0000 | INHALATION_SPRAY | Freq: Four times a day (QID) | RESPIRATORY_TRACT | 2 refills | Status: DC | PRN

## 2019-01-24 DIAGNOSIS — N3946 Mixed incontinence: Secondary | ICD-10-CM | POA: Diagnosis not present

## 2019-01-24 DIAGNOSIS — N3941 Urge incontinence: Secondary | ICD-10-CM | POA: Diagnosis not present

## 2019-01-31 ENCOUNTER — Telehealth: Payer: Self-pay | Admitting: Endocrinology

## 2019-01-31 NOTE — Telephone Encounter (Signed)
Called pt and answered all questions pertaining to why she is taking this medication and any side effects to taking this medication. Verbalized acceptance and understanding.

## 2019-01-31 NOTE — Telephone Encounter (Signed)
Patient called to inquire about a new medication (levothyroxine (SYNTHROID) 50 MCG tablet). Patient has questions about the above medication and requests to be called at ph# [phone].

## 2019-02-05 DIAGNOSIS — N3946 Mixed incontinence: Secondary | ICD-10-CM | POA: Diagnosis not present

## 2019-02-19 DIAGNOSIS — M1711 Unilateral primary osteoarthritis, right knee: Secondary | ICD-10-CM | POA: Diagnosis not present

## 2019-02-19 DIAGNOSIS — M1712 Unilateral primary osteoarthritis, left knee: Secondary | ICD-10-CM | POA: Diagnosis not present

## 2019-02-20 DIAGNOSIS — M1711 Unilateral primary osteoarthritis, right knee: Secondary | ICD-10-CM | POA: Diagnosis not present

## 2019-02-20 DIAGNOSIS — M1712 Unilateral primary osteoarthritis, left knee: Secondary | ICD-10-CM | POA: Diagnosis not present

## 2019-03-08 IMAGING — NM NM THYROID IMAGING W/ UPTAKE MULTI (4&24 HR)
4 series · 4 of 4 positions shown · non-contrast
Comparison: 05/19/2014

Correlation: Thyroid ultrasound 03/26/2014

CLINICAL DATA: Hyperthyroidism, palpitations, neck tenderness,
nervousness, anxiousness, increased appetite, increased irritability
and mood venous, fatigue, dry skin

EXAM:
THYROID SCAN AND UPTAKE - 4 AND 24 HOURS
TECHNIQUE: Following oral administration of K-MPF capsule, anterior planar
imaging was acquired at 24 hours. Thyroid uptake was calculated with
a thyroid probe at 4-6 hours and 24 hours.
RADIOPHARMACEUTICALS:  459 uCi K-MPF sodium iodide p.o.

[Series 1: anterior · 1.63mm/px · 1 of 1 slices shown]
[im 1/1]
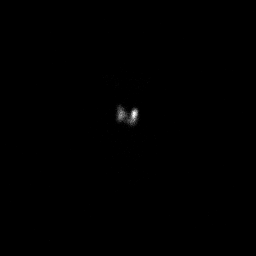

[Series 2: ant w marker · 1.18mm/px · 1 of 1 slices shown]
[im 1/1  full-range]
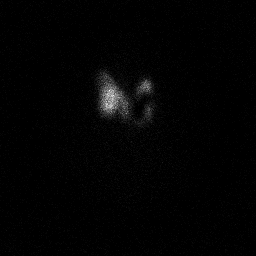

[Series 3: lao · 1.18mm/px · 1 of 1 slices shown]
[im 1/1]
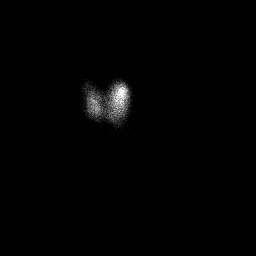

[Series 4: rao · 1.18mm/px · 1 of 1 slices shown]
[im 1/1]
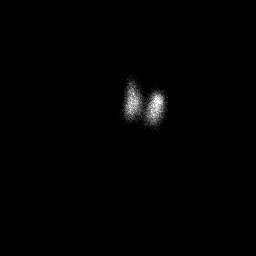

[4 of 4 positions shown; findings below may reference images not displayed]

FINDINGS: Probable mild warm nodule at upper pole of LEFT thyroid lobe.

Homogeneous uptake of tracer within remaining thyroid tissue.

No cold nodules identified.

4 hour K-MPF uptake = 21.5% (normal 5-20%)

24 hour K-MPF uptake = 49.8% (normal 10-30%)
IMPRESSION: Elevated 4 hour and 24 hour radio iodine uptakes as above consistent
with hyperthyroidism.

When compared to the previous study, the 24 hour radio iodine uptake
has increased from the 23.8% on the previous exam.

Homogeneous tracer distribution in both thyroid lobes with suspect
mildly hyperfunctional adenoma at the upper pole the LEFT lobe.

## 2019-03-14 DIAGNOSIS — H25813 Combined forms of age-related cataract, bilateral: Secondary | ICD-10-CM | POA: Diagnosis not present

## 2019-03-14 DIAGNOSIS — H401111 Primary open-angle glaucoma, right eye, mild stage: Secondary | ICD-10-CM | POA: Diagnosis not present

## 2019-03-14 DIAGNOSIS — H524 Presbyopia: Secondary | ICD-10-CM | POA: Diagnosis not present

## 2019-03-14 DIAGNOSIS — H35371 Puckering of macula, right eye: Secondary | ICD-10-CM | POA: Diagnosis not present

## 2019-03-18 ENCOUNTER — Other Ambulatory Visit: Payer: Self-pay

## 2019-03-20 ENCOUNTER — Ambulatory Visit: Payer: Medicare HMO | Admitting: Endocrinology

## 2019-03-24 ENCOUNTER — Other Ambulatory Visit: Payer: Self-pay | Admitting: Orthopaedic Surgery

## 2019-03-25 ENCOUNTER — Telehealth: Payer: Self-pay | Admitting: Physician Assistant

## 2019-03-25 NOTE — Telephone Encounter (Signed)
Please call and schedule appointment for in-office visit so we can discuss blood pressure and her pre-op paperwork.

## 2019-03-25 NOTE — Telephone Encounter (Signed)
Called pt and scheduled appt for January 5th.

## 2019-03-26 ENCOUNTER — Ambulatory Visit: Payer: Medicare HMO | Admitting: Physician Assistant

## 2019-04-07 ENCOUNTER — Other Ambulatory Visit: Payer: Self-pay

## 2019-04-08 ENCOUNTER — Telehealth: Payer: Self-pay | Admitting: Physician Assistant

## 2019-04-08 ENCOUNTER — Encounter: Payer: Self-pay | Admitting: Physician Assistant

## 2019-04-08 ENCOUNTER — Ambulatory Visit (INDEPENDENT_AMBULATORY_CARE_PROVIDER_SITE_OTHER): Payer: Medicare HMO | Admitting: Physician Assistant

## 2019-04-08 VITALS — BP 130/88 | HR 76 | Temp 98.1°F | Ht 67.0 in | Wt 213.2 lb

## 2019-04-08 DIAGNOSIS — R7309 Other abnormal glucose: Secondary | ICD-10-CM | POA: Diagnosis not present

## 2019-04-08 DIAGNOSIS — Z01818 Encounter for other preprocedural examination: Secondary | ICD-10-CM | POA: Diagnosis not present

## 2019-04-08 DIAGNOSIS — E0590 Thyrotoxicosis, unspecified without thyrotoxic crisis or storm: Secondary | ICD-10-CM | POA: Diagnosis not present

## 2019-04-08 DIAGNOSIS — I10 Essential (primary) hypertension: Secondary | ICD-10-CM | POA: Diagnosis not present

## 2019-04-08 LAB — HEMOGLOBIN A1C: Hgb A1c MFr Bld: 5.6 % (ref 4.6–6.5)

## 2019-04-08 NOTE — Assessment & Plan Note (Signed)
Update HgbA1c for pre-surgical clearance reasons.

## 2019-04-08 NOTE — Assessment & Plan Note (Signed)
Management per Dr. Geraldo Pitter -- will inform his office that she has upcoming surgery and will defer cardiac clearance to them as she is overdue for her follow-up visit.

## 2019-04-08 NOTE — Patient Instructions (Signed)
It was great to see you!  I will be in touch with your labs as soon as they return.          Take care,  Inda Coke PA-C

## 2019-04-08 NOTE — Progress Notes (Signed)
Chief Complaint:  Kari Coleman is a 69 y.o. female who presents today for consultation for surgical clearance at the request of Dr. Rhona Raider for R knee arthroplasty under spinal anesthesia.  Assessment/Plan:  New/Acute Problems: None  Chronic Problems Addressed Today: Pre-op evaluation  Will await HgbA1c results to complete the rest of her pre-op form sent from Orthopedics, otherwise cleared from a medical standpoint. Will defer cardiac clearance to her cardiologist.  Hypertension Management per Dr. Geraldo Pitter -- will inform his office that she has upcoming surgery and will defer cardiac clearance to them as she is overdue for her follow-up visit.  Hyperthyroidism Management per Dr. Renato Shin. She is overdue for labs and follow-up.  Elevated glucose Update HgbA1c for pre-surgical clearance reasons.       Subjective:  HPI:  Presurgical clearance Planning for R knee arthroplasty later this month.  HTN Currently taking Norvasc 10 mg, Lisinopril-HCTZ 20-25 mg, Metoprolol XL 100 mg daily. At home blood pressure readings are: variable. Patient denies chest pain, SOB, blurred vision, dizziness, unusual headaches, lower leg swelling. Patient is compliant with medication. Denies excessive caffeine intake, stimulant usage, excessive alcohol intake, or increase in salt consumption.  BP Readings from Last 3 Encounters:  04/08/19 130/88  01/22/19 132/84  01/21/19 124/80   Sees Dr. Sunny Schlein Revankar for her HTN and has not seen for her 6 month follow-up yet. She underwent stress test and Holter monitor earlier this year which was presumed to be normal.  HyperTSH She is followed by Dr. Renato Shin, labs last checked on 01/21/19 and she was found to have hypoTSH. She was started on levothyroxine 50 mcg and has not followed up with him after 2 months.  Elevated glucose Has history of elevated glucose, and needs updated HgbA1c for her surgery. She denies concerns for symptoms of  hypo/hyperglycemia at this time.  ROS: see above, otherwise a complete review of systems was negative.   The following were reviewed and entered/updated in epic: Past Medical History:  Diagnosis Date  . Cervical cancer ([place])   . Glaucoma 2017  . History of echocardiogram    Echo 9/16:  Mild LVH, EF 55-60%, no RWMA, Gr 1 DD, mild MR // Echo 1/17:  Mild LVH, EF 50-55%, Gr 1 DD, mild to mod MR, mild LAE  . Hypertension   . Hyperthyroidism    s/p RAI treatment  . 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 Tobacco abuse   . Urinary incontinence    Patient Active Problem List   Diagnosis Date Noted  . Elevated glucose 04/08/2019  . Chest discomfort 04/25/2018  . Ex-smoker 04/25/2018  . Primary osteoarthritis of right knee 01/04/2016  . Hypertension   . Hyperthyroidism    Past Surgical History:  Procedure Laterality Date  . REPAIR OF COMPLEX TRACTION RETINAL DETACHMENT    . TOTAL ABDOMINAL HYSTERECTOMY W/ BILATERAL SALPINGOOPHORECTOMY      Family History  Problem Relation Age of Onset  . Hypertension Mother   . Heart failure Mother   . Diabetes Mother   . Pancreatic cancer Father   . Colon cancer Maternal Aunt   . Cancer Sister   . Thyroid disease Paternal Aunt   . Cancer Paternal Aunt   . Thyroid disease Other        paternal side  . Cancer Maternal Grandfather   . Diabetes Paternal Grandmother   . Diabetes Maternal Uncle     Medications- reviewed and updated Current Outpatient Medications  Medication Sig Dispense Refill  . ADULT ASPIRIN EC LOW STRENGTH PO Take by mouth.    Marland Kitchen albuterol (VENTOLIN HFA) 108 (90 Base) MCG/ACT inhaler Inhale 2 puffs into the lungs every 6 (six) hours as needed for wheezing or shortness of breath. 18 g 2  . amLODipine (NORVASC) 10 MG tablet Take 1 tablet by mouth once daily 90 tablet 0  . latanoprost (XALATAN) 0.005 % ophthalmic solution Place into both eyes daily. As directed    . levothyroxine (SYNTHROID) 50 MCG tablet Take 1  tablet (50 mcg total) by mouth daily before breakfast. 90 tablet 0  . lisinopril-hydrochlorothiazide (ZESTORETIC) 20-25 MG tablet Take 1 tablet by mouth once daily 90 tablet 0  . metoprolol succinate (TOPROL-XL) 100 MG 24 hr tablet Take 1 tablet (100 mg total) by mouth daily. Take with or immediately following a meal. 90 tablet 1   No current facility-administered medications for this visit.    Allergies-reviewed and updated Allergies  Allergen Reactions  . Doxycycline Nausea Only  . Codeine Nausea And Vomiting  . Pollen Extract     Social History   Socioeconomic History  . Marital status: Single    Spouse name: Not on file  . Number of children: 2  . Years of education: Not on file  . Highest education level: Not on file  Occupational History  . Occupation: CNA    Comment: [employer]  Tobacco Use  . Smoking status: Former Smoker    Packs/day: 0.50    Years: 48.00    Pack years: 24.00    Types: Cigarettes    Start date: 11/30/1968    Quit date: 10/31/2016    Years since quitting: 2.4  . Smokeless tobacco: Never Used  . Tobacco comment: did  not smoke more than .5 pack   Substance and Sexual Activity  . Alcohol use: No    Alcohol/week: 0.0 standard drinks    Comment: rarely   . Drug use: No  . Sexual activity: Yes  Other Topics Concern  . Not on file  Social History Narrative   Caregiver   Daughter and grandson in household   Social Determinants of Health   Financial Resource Strain:   . Difficulty of Paying Living Expenses: Not on file  Food Insecurity:   . Worried About Charity fundraiser in the Last Year: Not on file  . Ran Out of Food in the Last Year: Not on file  Transportation Needs:   . Lack of Transportation (Medical): Not on file  . Lack of Transportation (Non-Medical): Not on file  Physical Activity:   . Days of Exercise per Week: Not on file  . Minutes of Exercise per Session: Not on file  Stress:   . Feeling of Stress : Not on file   Social Connections:   . Frequency of Communication with Friends and Family: Not on file  . Frequency of Social Gatherings with Friends and Family: Not on file  . Attends Religious Services: Not on file  . Active Member of Clubs or Organizations: Not on file  . Attends Archivist Meetings: Not on file  . Marital Status: Not on file         Objective:  Physical Exam: BP 130/88 (BP Location: Left Arm, Patient Position: Sitting, Cuff Size: Large)   Pulse 76   Temp 98.1 F (36.7 C) (Temporal)   Ht 5\' 7"  (1.702 m)   Wt 213 lb 4 oz (96.7 kg)   SpO2  97%   BMI 33.40 kg/m   Gen: NAD, resting comfortably CV: Regular rate and rhythm with no murmurs appreciated Pulm: Normal work of breathing, clear to auscultation bilaterally with no crackles, wheezes, or rhonchi GI: Normal bowel sounds present. Soft, Nontender, Nondistended. MSK: No edema, cyanosis, or clubbing noted Skin: Warm, dry Neuro: Grossly normal, moves all extremities Psych: Normal affect and thought content  No results found for this or any previous visit (from the past 24 hour(s)).      A copy of this note will be forwarded to the requesting physician.   Inda Coke PA-C 04/08/19

## 2019-04-08 NOTE — Telephone Encounter (Signed)
  LAST APPOINTMENT DATE: 04/09/19  NEXT APPOINTMENT DATE:@1 /09/2019  MEDICATION: metoprolol succinate (TOPROL-XL) 100 MG 24 hr tablet  amLODipine (NORVASC) 10 MG tablet  PHARMACY: [facility], [place] Phone:  [phone]  Fax:  [phone]     Patient said she has just a few days left.   **Let patient know to contact pharmacy at the end of the day to make sure medication is ready. **  ** Please notify patient to allow 48-72 hours to process**  **Encourage patient to contact the pharmacy for refills or they can request refills through [facility]**  CLINICAL FILLS OUT ALL BELOW:   LAST REFILL:  QTY:  REFILL DATE:    OTHER COMMENTS:    Okay for refill?  Please advise

## 2019-04-08 NOTE — Assessment & Plan Note (Signed)
Management per Dr. Renato Shin. She is overdue for labs and follow-up.

## 2019-04-09 ENCOUNTER — Ambulatory Visit (INDEPENDENT_AMBULATORY_CARE_PROVIDER_SITE_OTHER): Payer: Medicare HMO

## 2019-04-09 ENCOUNTER — Other Ambulatory Visit: Payer: Self-pay

## 2019-04-09 ENCOUNTER — Encounter: Payer: Self-pay | Admitting: Physician Assistant

## 2019-04-09 DIAGNOSIS — Z23 Encounter for immunization: Secondary | ICD-10-CM | POA: Diagnosis not present

## 2019-04-09 MED ORDER — AMLODIPINE BESYLATE 10 MG PO TABS: 10.0000 mg | ORAL_TABLET | Freq: Every day | ORAL | 1 refills | Status: DC

## 2019-04-09 MED ORDER — METOPROLOL SUCCINATE ER 100 MG PO TB24: 100.0000 mg | ORAL_TABLET | Freq: Every day | ORAL | 1 refills | Status: DC

## 2019-04-09 NOTE — Addendum Note (Signed)
Addended by: Marian Sorrow on: 04/09/2019 09:07 AM   Modules accepted: Orders

## 2019-04-09 NOTE — Telephone Encounter (Signed)
Rx's sent to pharmacy. Pt notified.

## 2019-04-10 ENCOUNTER — Telehealth: Payer: Self-pay

## 2019-04-10 ENCOUNTER — Telehealth: Payer: Self-pay | Admitting: Physician Assistant

## 2019-04-10 NOTE — Telephone Encounter (Signed)
Patient called in stating that her surgery office told her she needed to make an appt with Dr before her surgery which is scheduled on 04/22/2019. I looked on the schedule and do not see any openings piror to her surgery date.   Please advise

## 2019-04-10 NOTE — Telephone Encounter (Signed)
Patient called in and wanted to talk about her upcoming surgery that got moved up. Patient would like a call back to discuss.

## 2019-04-11 ENCOUNTER — Encounter: Payer: Self-pay | Admitting: Endocrinology

## 2019-04-11 ENCOUNTER — Ambulatory Visit (INDEPENDENT_AMBULATORY_CARE_PROVIDER_SITE_OTHER): Payer: Medicare HMO | Admitting: Endocrinology

## 2019-04-11 DIAGNOSIS — E890 Postprocedural hypothyroidism: Secondary | ICD-10-CM | POA: Diagnosis not present

## 2019-04-11 DIAGNOSIS — E039 Hypothyroidism, unspecified: Secondary | ICD-10-CM | POA: Insufficient documentation

## 2019-04-11 NOTE — Patient Instructions (Addendum)
Blood tests are requested for you today.  We'll let you know about the results.   Based on the results, I hope to clear you for the surgery.   Please come back for a follow-up appointment in 3 months.

## 2019-04-11 NOTE — Progress Notes (Signed)
Subjective:    Patient ID: Kari Coleman, female    DOB: 1950/11/06, 69 y.o.   MRN: [id number]  HPI telehealth visit today via doxy video visit.  Alternatives to telehealth are presented to this patient, and the patient agrees to the telehealth visit. Pt is advised of the cost of the visit, and agrees to this, also.   Patient is at home, and I am at the office.   Persons attending the telehealth visit: the patient and I Pt returns for f/u of post-RAI hypothyroidism (dx'ed 2013; she was initially rx'ed tapazole, but she later chose RAI rx; [place] (2015) showed MNG; NM scan (2016) showed MNG with normal uptake (24%); Cytology (2016) showed B[place] CATEGORY 2 and 3; she took RAI rx 4/20; she took tapazole x a few more mos, but she stopped in early 7/20; she started synthroid later in 2020).  pt states she feels well in general.  Specifically, she denies palpitations and tremor.  She will have TKR on 04/22/19.   Past Medical History:  Diagnosis Date  . Cervical cancer ([place])   . Glaucoma 2017  . History of echocardiogram    Echo 9/16:  Mild LVH, EF 55-60%, no RWMA, Gr 1 DD, mild MR // Echo 1/17:  Mild LVH, EF 50-55%, Gr 1 DD, mild to mod MR, mild LAE  . Hypertension   . Hyperthyroidism    s/p RAI treatment  . 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 Tobacco abuse   . Urinary incontinence     Past Surgical History:  Procedure Laterality Date  . REPAIR OF COMPLEX TRACTION RETINAL DETACHMENT    . TOTAL ABDOMINAL HYSTERECTOMY W/ BILATERAL SALPINGOOPHORECTOMY      Social History   Socioeconomic History  . Marital status: Single    Spouse name: Not on file  . Number of children: 2  . Years of education: Not on file  . Highest education level: Not on file  Occupational History  . Occupation: CNA    Comment: [employer]  Tobacco Use  . Smoking status: Former Smoker    Packs/day: 0.50    Years: 48.00    Pack years: 24.00    Types: Cigarettes    Start date: 11/30/1968    Quit date: 10/31/2016    Years since quitting: 2.4  . Smokeless tobacco: Never Used  . Tobacco comment: did  not smoke more than .5 pack   Substance and Sexual Activity  . Alcohol use: No    Alcohol/week: 0.0 standard drinks    Comment: rarely   . Drug use: No  . Sexual activity: Yes  Other Topics Concern  . Not on file  Social History Narrative   Caregiver   Daughter and grandson in household   Social Determinants of Health   Financial Resource Strain:   . Difficulty of Paying Living Expenses: Not on file  Food Insecurity:   . Worried About Charity fundraiser in the Last Year: Not on file  . Ran Out of Food in the Last Year: Not on file  Transportation Needs:   . Lack of Transportation (Medical): Not on file  . Lack of Transportation (Non-Medical): Not on file  Physical Activity:   . Days of Exercise per Week: Not on file  . Minutes of Exercise per Session: Not on file  Stress:   . Feeling of Stress : Not on file  Social Connections:   . Frequency of Communication with Friends  and Family: Not on file  . Frequency of Social Gatherings with Friends and Family: Not on file  . Attends Religious Services: Not on file  . Active Member of Clubs or Organizations: Not on file  . Attends Archivist Meetings: Not on file  . Marital Status: Not on file  Intimate Partner Violence:   . Fear of Current or Ex-Partner: Not on file  . Emotionally Abused: Not on file  . Physically Abused: Not on file  . Sexually Abused: Not on file    Current Outpatient Medications on File Prior to Visit  Medication Sig Dispense Refill  . ADULT ASPIRIN EC LOW STRENGTH PO Take by mouth.    Marland Kitchen albuterol (VENTOLIN HFA) 108 (90 Base) MCG/ACT inhaler Inhale 2 puffs into the lungs every 6 (six) hours as needed for wheezing or shortness of breath. 18 g 2  . amLODipine (NORVASC) 10 MG tablet Take 1 tablet (10 mg total) by mouth daily. 90 tablet 1  . latanoprost (XALATAN) 0.005 % ophthalmic solution  Place into both eyes daily. As directed    . levothyroxine (SYNTHROID) 50 MCG tablet Take 1 tablet (50 mcg total) by mouth daily before breakfast. 90 tablet 0  . lisinopril-hydrochlorothiazide (ZESTORETIC) 20-25 MG tablet Take 1 tablet by mouth once daily 90 tablet 0  . metoprolol succinate (TOPROL-XL) 100 MG 24 hr tablet Take 1 tablet (100 mg total) by mouth daily. Take with or immediately following a meal. 90 tablet 1   No current facility-administered medications on file prior to visit.    Allergies  Allergen Reactions  . Doxycycline Nausea Only  . Codeine Nausea And Vomiting  . Pollen Extract     Family History  Problem Relation Age of Onset  . Hypertension Mother   . Heart failure Mother   . Diabetes Mother   . Pancreatic cancer Father   . Colon cancer Maternal Aunt   . Cancer Sister   . Thyroid disease Paternal Aunt   . Cancer Paternal Aunt   . Thyroid disease Other        paternal side  . Cancer Maternal Grandfather   . Diabetes Paternal Grandmother   . Diabetes Maternal Uncle     There were no vitals taken for this visit.   Review of Systems Denies fever    Objective:   Physical Exam      Assessment & Plan:  Hyperthyroidism: due for recheck.  Knee OA: she needs clearance.   Patient Instructions  Blood tests are requested for you today.  We'll let you know about the results.   Based on the results, I hope to clear you for the surgery.   Please come back for a follow-up appointment in 3 months.

## 2019-04-11 NOTE — Telephone Encounter (Signed)
Patient has been scheduled for virtual visit today 04/11/2019 [facility] this with Dr and his nurse l

## 2019-04-11 NOTE — Telephone Encounter (Signed)
Per Dr. Loanne Drilling, unable to schedule pt sooner than anticipated surgical date. Since clearance is required, pt will need to be scheduled for Dr. Cordelia Pen next availability. Routing this message back to front desk for scheduling purposes

## 2019-04-11 NOTE — Telephone Encounter (Signed)
Spoke to pt wanted to let us know her surgery was moved up to the 19th of Jan to make sure papers were sent. Told her I already faxed papers over you just need to see Cardiology. Pt verbalized understanding and said she has an appt on the 12th. Told her okay good. Pt asked does she need to see Dr. Loanne Drilling? Told her no just PCP which you already did and Cardiology. Pt verbalized understanding.

## 2019-04-11 NOTE — Telephone Encounter (Signed)
Please advise how you wish to schedule this appt

## 2019-04-11 NOTE — Telephone Encounter (Signed)
Ov next avail please

## 2019-04-14 ENCOUNTER — Other Ambulatory Visit: Payer: Self-pay | Admitting: Endocrinology

## 2019-04-14 ENCOUNTER — Other Ambulatory Visit: Payer: Self-pay

## 2019-04-14 ENCOUNTER — Other Ambulatory Visit (INDEPENDENT_AMBULATORY_CARE_PROVIDER_SITE_OTHER): Payer: Medicare HMO

## 2019-04-14 DIAGNOSIS — R9431 Abnormal electrocardiogram [ECG] [EKG]: Secondary | ICD-10-CM | POA: Insufficient documentation

## 2019-04-14 DIAGNOSIS — E890 Postprocedural hypothyroidism: Secondary | ICD-10-CM

## 2019-04-14 LAB — TSH: TSH: 2.92 u[IU]/mL (ref 0.35–4.50)

## 2019-04-14 LAB — T4, FREE: Free T4: 0.86 ng/dL (ref 0.60–1.60)

## 2019-04-14 NOTE — Progress Notes (Signed)
Cardiology Office Note:    Date:  04/15/2019   ID:  Kari Coleman, DOB [birth year], MRN [id number]  PCP:  Inda Coke, PA  Cardiologist:  Shirlee More, MD    Referring MD: Inda Coke, [place]    ASSESSMENT:    1. Preop cardiovascular exam   2. Essential hypertension   3. Abnormal EKG   4. Postablative hypothyroidism    PLAN:    In order of problems listed above:  1. Planned procedure is elective intermediate risk and the patient's hypertension is stable.  She has no evidence of heart failure severe arrhythmia or obstructive CAD.  I think she requires any further preoperative cardiac testing in my opinion she is optimized.  I told her to stop aspirin 7 days prior to surgery.  Postoperatively like her to go to monitored bed the first 24 hours check an EKG postoperative day 1 and continue her usual antihypertensive medications. 2. We will continue current treatment through the perioperative. 3. Stable pattern secondary to left ventricular hypertrophy and repolarization 4. Stable continue thyroid supplement 5. Initiate PPI for heartburn and reflux   Next appointment: As needed in the future   Medication Adjustments/Labs and Tests Ordered: Current medicines are reviewed at length with the patient today.  Concerns regarding medicines are outlined above.  Orders Placed This Encounter  Procedures  . EKG 12-Lead   Meds ordered this encounter  Medications  . pantoprazole (PROTONIX) 40 MG tablet    Sig: Take 1 tablet (40 mg total) by mouth daily.    Dispense:  30 tablet    Refill:  11    Chief Complaint  Patient presents with  . Pre-op Exam    History of Present Illness:    Kari Coleman is a 69 y.o. female with a hx of chest pain palpitation abnormal EKG last seen 04/25/2018.  Is scheduled for total knee arthroplasty right side electively 04/22/2019 Dr. Rhona Raider.  She was seen by my partner 04/25/2018 complaints of chest pain and palpitation.  Echocardiogram 11/23/2017  shows normal left ventricular size and function mild LVH and mild mitral regurgitation. Following that visit a Holter monitor was performed 48 hours which was described as unremarkable except for rare brief runs of atrial premature contractions.  A myocardial perfusion study was performed 04/30/2018 there is no ischemic ST abnormality during the test left ventricular function was normal ejection fraction 63% and myocardial perfusion was normal, this is a normal test Compliance with diet, lifestyle and medications: Yes  Reviewed her testing with her she has done well she has no edema chest pain orthopnea but has intermittent shortness of breath relieved with her bronchodilator.  Complains bitterly of heartburn and reflux symptoms and I will place her on a PPI. Past Medical History:  Diagnosis Date  . Cervical cancer ([place])   . Glaucoma 2017  . History of echocardiogram    Echo 9/16:  Mild LVH, EF 55-60%, no RWMA, Gr 1 DD, mild MR // Echo 1/17:  Mild LVH, EF 50-55%, Gr 1 DD, mild to mod MR, mild LAE  . Hypertension   . Hyperthyroidism    s/p RAI treatment  . 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 Tobacco abuse   . Urinary incontinence     Past Surgical History:  Procedure Laterality Date  . REPAIR OF COMPLEX TRACTION RETINAL DETACHMENT    . TOTAL ABDOMINAL HYSTERECTOMY W/ BILATERAL SALPINGOOPHORECTOMY      Current Medications: Current Meds  Medication  Sig  . albuterol (VENTOLIN HFA) 108 (90 Base) MCG/ACT inhaler Inhale 2 puffs into the lungs every 6 (six) hours as needed for wheezing or shortness of breath.  Marland Kitchen amLODipine (NORVASC) 10 MG tablet Take 1 tablet (10 mg total) by mouth daily. (Patient taking differently: Take 10 mg by mouth every evening. )  . aspirin EC 81 MG tablet Take 81 mg by mouth every evening.  . brimonidine (ALPHAGAN) 0.2 % ophthalmic solution Place 1 drop into the right eye 2 (two) times daily.  Marland Kitchen ibuprofen (ADVIL) 200 MG tablet Take 200 mg by mouth every  6 (six) hours as needed for headache or moderate pain.  Marland Kitchen latanoprost (XALATAN) 0.005 % ophthalmic solution Place 1 drop into both eyes at bedtime. As directed   . levothyroxine (SYNTHROID) 50 MCG tablet Take 1 tablet (50 mcg total) by mouth daily before breakfast.  . lisinopril-hydrochlorothiazide (ZESTORETIC) 20-25 MG tablet Take 1 tablet by mouth once daily (Patient taking differently: Take 1 tablet by mouth every evening. )  . metoprolol succinate (TOPROL-XL) 100 MG 24 hr tablet Take 1 tablet (100 mg total) by mouth daily. Take with or immediately following a meal. (Patient taking differently: Take 100 mg by mouth every evening. Take with or immediately following a meal.)     Allergies:   Doxycycline, Codeine, and Pollen extract   Social History   Socioeconomic History  . Marital status: Single    Spouse name: Not on file  . Number of children: 2  . Years of education: Not on file  . Highest education level: Not on file  Occupational History  . Occupation: CNA    Comment: [employer]  Tobacco Use  . Smoking status: Former Smoker    Packs/day: 0.50    Years: 48.00    Pack years: 24.00    Types: Cigarettes    Start date: 11/30/1968    Quit date: 10/31/2016    Years since quitting: 2.4  . Smokeless tobacco: Never Used  . Tobacco comment: did  not smoke more than .5 pack   Substance and Sexual Activity  . Alcohol use: No    Alcohol/week: 0.0 standard drinks    Comment: rarely   . Drug use: No  . Sexual activity: Yes  Other Topics Concern  . Not on file  Social History Narrative   Caregiver   Daughter and grandson in household   Social Determinants of Health   Financial Resource Strain:   . Difficulty of Paying Living Expenses: Not on file  Food Insecurity:   . Worried About Charity fundraiser in the Last Year: Not on file  . Ran Out of Food in the Last Year: Not on file  Transportation Needs:   . Lack of Transportation (Medical): Not on file  . Lack of  Transportation (Non-Medical): Not on file  Physical Activity:   . Days of Exercise per Week: Not on file  . Minutes of Exercise per Session: Not on file  Stress:   . Feeling of Stress : Not on file  Social Connections:   . Frequency of Communication with Friends and Family: Not on file  . Frequency of Social Gatherings with Friends and Family: Not on file  . Attends Religious Services: Not on file  . Active Member of Clubs or Organizations: Not on file  . Attends Archivist Meetings: Not on file  . Marital Status: Not on file     Family History: The patient's family history includes  Cancer in her maternal grandfather, paternal aunt, and sister; Colon cancer in her maternal aunt; Diabetes in her maternal uncle, mother, and paternal grandmother; Heart failure in her mother; Hypertension in her mother; Pancreatic cancer in her father; Thyroid disease in her paternal aunt and another family member. ROS:   Please see the history of present illness.    All other systems reviewed and are negative.  EKGs/Labs/Other Studies Reviewed:    The following studies were reviewed today:  EKG:  EKG ordered today and personally reviewed.  The ekg ordered today demonstrates sinus rhythm same pattern of QS in V2 consider lead placement or old anterior septal MI and T wave changes most consistent with left ventricular hypertrophy and repolarization  The 04/25/2018 was abnormal sinus rhythm inferior T wave inversion QS in V2 lead placement versus previous myocardial infarction.  HOLTER MONITOR REPORT: Date of test:                 05/02/2018 Duration of test:           48 hours Indication:                    Palpitations Ordering physician:         Jyl Heinz MD Referring physician:        Jyl Heinz MD Baseline rhythm: Sinus Minimum heart rate: 47 BPM.  Average heart rate: 72 BPM.  Maximal heart rate 117 BPM. Atrial arrhythmia: None significant.  Rare brief atrial runs Ventricular  arrhythmia: None significant  Conduction abnormality: None significant Symptoms: None significant Conclusion:  Unremarkable Holter monitor with only brief asymptomatic atrial runs.  Nuclear Stress Findings Isotope administration Rest isotope was administered  with an IV injection of 10.9 mCi Tc33m Tetrofosmin.  Rest SPECT images were obtained approximately 45 minutes post tracer injection.  Stress isotope was administered  with an IV injection of 32.7 mCi Tc57m Tetrofosmin   Stress SPECT images were obtained approximately 60 minutes post tracer injection.  Nuclear Study Quality Overall image quality is good.  Nuclear Measurements Study was gated.  Rest Perfusion Rest perfusion normal.  Stress Perfusion Stress perfusion normal.  Overall Study Impression Myocardial perfusion is normal.   The study is normal.   This is a low risk study.  Overall left ventricular systolic function was normal.    LV cavity size is normal.  Nuclear stress EF:  63%.  The left ventricular ejection fraction is normal (55-65%).    Echo 11/13/2017: Study Conclusions - Left ventricle: The cavity size was normal. There was mild   concentric hypertrophy. Systolic function was vigorous. The   estimated ejection fraction was in the range of 65% to 70%. Wall   motion was normal; there were no regional wall motion   abnormalities. Doppler parameters are consistent with abnormal   left ventricular relaxation (grade 1 diastolic dysfunction).   Doppler parameters are consistent with elevated ventricular   end-diastolic filling pressure. - Aortic valve: There was no regurgitation. - Aortic root: The aortic root was normal in size. - Mitral valve: There was mild regurgitation. - Left atrium: The atrium was normal in size. - Right ventricle: Systolic function was normal. - Right atrium: The atrium was normal in size. - Tricuspid valve: There was trivial regurgitation. - Pulmonary arteries: Systolic pressure was within the  normal   range. - Inferior vena cava: The vessel was normal in size. - Pericardium, extracardiac: There was no pericardial effusion.  Recent Labs: 07/23/2018: ALT 17; BUN  30; Creatinine, Ser 1.17; Hemoglobin 13.7; Platelets 288.0; Potassium 4.1; Sodium 137 04/14/2019: TSH 2.92  Recent Lipid Panel No results found for: CHOL, TRIG, HDL, CHOLHDL, VLDL, LDLCALC, LDLDIRECT  Physical Exam:    VS:  BP 132/80 (BP Location: Left Arm, Patient Position: Sitting, Cuff Size: Large)   Pulse 65   Ht 5\' 7"  (1.702 m)   Wt 211 lb (95.7 kg)   SpO2 98%   BMI 33.05 kg/m     Wt Readings from Last 3 Encounters:  04/15/19 211 lb (95.7 kg)  04/08/19 213 lb 4 oz (96.7 kg)  01/22/19 207 lb 3.2 oz (94 kg)     GEN:  Well nourished, well developed in no acute distress HEENT: Normal NECK: No JVD; No carotid bruits LYMPHATICS: No lymphadenopathy CARDIAC: RRR, no murmurs, rubs, gallops RESPIRATORY:  Clear to auscultation without rales, wheezing or rhonchi  ABDOMEN: Soft, non-tender, non-distended MUSCULOSKELETAL:  No edema; No deformity  SKIN: Warm and dry NEUROLOGIC:  Alert and oriented x 3 PSYCHIATRIC:  Normal affect    Signed, Shirlee More, MD  04/15/2019 2:39 PM    [place]

## 2019-04-15 ENCOUNTER — Ambulatory Visit (INDEPENDENT_AMBULATORY_CARE_PROVIDER_SITE_OTHER): Payer: Medicare HMO | Admitting: Cardiology

## 2019-04-15 ENCOUNTER — Encounter: Payer: Self-pay | Admitting: Cardiology

## 2019-04-15 VITALS — BP 132/80 | HR 65 | Ht 67.0 in | Wt 211.0 lb

## 2019-04-15 DIAGNOSIS — R9431 Abnormal electrocardiogram [ECG] [EKG]: Secondary | ICD-10-CM

## 2019-04-15 DIAGNOSIS — E890 Postprocedural hypothyroidism: Secondary | ICD-10-CM | POA: Diagnosis not present

## 2019-04-15 DIAGNOSIS — I10 Essential (primary) hypertension: Secondary | ICD-10-CM | POA: Diagnosis not present

## 2019-04-15 DIAGNOSIS — Z01810 Encounter for preprocedural cardiovascular examination: Secondary | ICD-10-CM

## 2019-04-15 MED ORDER — PANTOPRAZOLE SODIUM 40 MG PO TBEC: 40.0000 mg | DELAYED_RELEASE_TABLET | Freq: Every day | ORAL | 11 refills | Status: DC

## 2019-04-15 NOTE — Patient Instructions (Signed)
Medication Instructions:  Your physician has recommended you make the following change in your medication:   Start: Protonix 40 MG Daily Stop: Aspirin 81 MG Daily 1 week prior to surgery  If you need a refill on your cardiac medications before your next appointment, please call your pharmacy*  Lab Work: None  If you have labs (blood work) drawn today and your tests are completely normal, you will receive your results only by: Marland Kitchen MyChart Message (if you have MyChart) OR . A paper copy in the mail If you have any lab test that is abnormal or we need to change your treatment, we will call you to review the results.  Testing/Procedures: None  Follow-Up: At [facility], you and your health needs are our priority.  As part of our continuing mission to provide you with exceptional heart care, we have created designated Provider Care Teams.  These Care Teams include your primary Cardiologist (physician) and Advanced Practice Providers (APPs -  Physician Assistants and Nurse Practitioners) who all work together to provide you with the care you need, when you need it.  Your next appointment:   Follow up as needed.

## 2019-04-16 NOTE — Progress Notes (Signed)
PCP - Inda Coke  PA-C Cardiologist - Shirlee More lov 04-15-19 cleared epic note Endo clearance 04-14-19 in [facility] lab report epic  Chest x-ray -  EKG - 04-15-19 epic Stress Test - 04-30-18 epic ECHO - 11-23-17 epic Cardiac Cath -  Hgba1c 04-08-19 epic Sleep Study -  CPAP -   Fasting Blood Sugar -  Checks Blood Sugar _____ times a day  Blood Thinner Instructions: Aspirin Instructions  81mg  asa stop 7 days prior to surgery   Last Dose::  Anesthesia review:   Patient denies shortness of breath, fever, cough and chest pain at PAT appointment   Patient verbalized understanding of instructions that were given to them at the PAT appointment. Patient was also instructed that they will need to review over the PAT instructions again at home before surgery.

## 2019-04-16 NOTE — Patient Instructions (Addendum)
DUE TO COVID-19 ONLY ONE VISITOR IS ALLOWED TO COME WITH YOU AND STAY IN THE WAITING ROOM ONLY DURING PRE OP AND PROCEDURE DAY OF SURGERY. THE 1 VISITOR MAY VISIT WITH YOU AFTER SURGERY IN YOUR PRIVATE ROOM DURING VISITING HOURS ONLY!  YOU NEED TO HAVE A COVID 19 TEST ON_______ @_______ , THIS TEST MUST BE DONE BEFORE SURGERY, COME  [place], [place]  , [address].  ([place]) ONCE YOUR COVID TEST IS COMPLETED, PLEASE BEGIN THE QUARANTINE INSTRUCTIONS AS OUTLINED IN YOUR HANDOUT.                ELAIJAH DOANE  04/16/2019   Your procedure is scheduled on: 04-22-19   Report to [facility] Main  Entrance   Report to admitting at       [place] AM     Call this number if you have problems the morning of surgery [phone]    Remember: NO SOLID FOOD AFTER MIDNIGHT THE NIGHT PRIOR TO SURGERY. NOTHING BY MOUTH EXCEPT CLEAR LIQUIDS UNTIL 0644 am . PLEASE FINISH ENSURE DRINK PER SURGEON ORDER  WHICH NEEDS TO BE COMPLETED AT   [id number] am then nothing by mouth .    CLEAR LIQUID DIET   Foods Allowed                                                                                Foods Excluded  Coffee and tea, regular and decaf    No creamer                                            liquids that you cannot  Plain Jell-O any favor except red or purple                                           see through such as: Fruit ices (not with fruit pulp)                                                         milk, soups, orange juice  Iced Popsicles                                                        All solid food Carbonated beverages, regular and diet                                    Cranberry, grape and apple juices Sports drinks like Gatorade Lightly seasoned clear broth or consume(fat free) Sugar, honey syrup  _____________________________________________________________________    BRUSH YOUR TEETH MORNING OF SURGERY AND RINSE YOUR MOUTH OUT, NO CHEWING GUM  CANDY OR MINTS.     Take these medicines the morning of surgery with A SIP OF WATER: protonix, levothyroxine,eye drops as usual, inhaler and bring inhaler with you  DO NOT TAKE ANY DIABETIC MEDICATIONS DAY OF YOUR SURGERY                               You may not have any metal on your body including hair pins and              piercings  Do not wear jewelry, make-up, lotions, powders or perfumes, deodorant             Do not wear nail polish on your fingernails.  Do not shave  48 hours prior to surgery.     Do not bring valuables to the hospital. [place].  Contacts, dentures or bridgework may not be worn into surgery.      Patients discharged the day of surgery will not be allowed to drive home. IF YOU ARE HAVING SURGERY AND GOING HOME THE SAME DAY, YOU MUST HAVE AN ADULT TO DRIVE YOU HOME AND BE WITH YOU FOR 24 HOURS. YOU MAY GO HOME BY TAXI OR UBER OR ORTHERWISE, BUT AN ADULT MUST ACCOMPANY YOU HOME AND STAY WITH YOU FOR 24 HOURS.  Name and phone number of your driver:  Special Instructions: N/A              Please read over the following fact sheets you were given: _____________________________________________________________________             [facility] - Preparing for Surgery Before surgery, you can play an important role.  Because skin is not sterile, your skin needs to be as free of germs as possible.  You can reduce the number of germs on your skin by washing with CHG (chlorahexidine gluconate) soap before surgery.  CHG is an antiseptic cleaner which kills germs and bonds with the skin to continue killing germs even after washing. Please DO NOT use if you have an allergy to CHG or antibacterial soaps.  If your skin becomes reddened/irritated stop using the CHG and inform your nurse when you arrive at Short Stay. Do not shave (including legs and underarms) for at least 48 hours prior to the first CHG shower.  You may shave  your face/neck. Please follow these instructions carefully:  1.  Shower with CHG Soap the night before surgery and the  morning of Surgery.  2.  If you choose to wash your hair, wash your hair first as usual with your  normal  shampoo.  3.  After you shampoo, rinse your hair and body thoroughly to remove the  shampoo.                           4.  Use CHG as you would any other liquid soap.  You can apply chg directly  to the skin and wash                       Gently with a scrungie or clean washcloth.  5.  Apply the CHG Soap to your body ONLY FROM  THE NECK DOWN.   Do not use on face/ open                           Wound or open sores. Avoid contact with eyes, ears mouth and genitals (private parts).                       Wash face,  Genitals (private parts) with your normal soap.             6.  Wash thoroughly, paying special attention to the area where your surgery  will be performed.  7.  Thoroughly rinse your body with warm water from the neck down.  8.  DO NOT shower/wash with your normal soap after using and rinsing off  the CHG Soap.                9.  Pat yourself dry with a clean towel.            10.  Wear clean pajamas.            11.  Place clean sheets on your bed the night of your first shower and do not  sleep with pets. Day of Surgery : Do not apply any lotions/deodorants the morning of surgery.  Please wear clean clothes to the hospital/surgery center.  FAILURE TO FOLLOW THESE INSTRUCTIONS MAY RESULT IN THE CANCELLATION OF YOUR SURGERY PATIENT SIGNATURE_________________________________  NURSE SIGNATURE__________________________________  ________________________________________________________________________   Adam Phenix  An incentive spirometer is a tool that can help keep your lungs clear and active. This tool measures how well you are filling your lungs with each breath. Taking long deep breaths may help reverse or decrease the chance of developing  breathing (pulmonary) problems (especially infection) following:  A long period of time when you are unable to move or be active. BEFORE THE PROCEDURE   If the spirometer includes an indicator to show your best effort, your nurse or respiratory therapist will set it to a desired goal.  If possible, sit up straight or lean slightly forward. Try not to slouch.  Hold the incentive spirometer in an upright position. INSTRUCTIONS FOR USE  1. Sit on the edge of your bed if possible, or sit up as far as you can in bed or on a chair. 2. Hold the incentive spirometer in an upright position. 3. Breathe out normally. 4. Place the mouthpiece in your mouth and seal your lips tightly around it. 5. Breathe in slowly and as deeply as possible, raising the piston or the ball toward the top of the column. 6. Hold your breath for 3-5 seconds or for as long as possible. Allow the piston or ball to fall to the bottom of the column. 7. Remove the mouthpiece from your mouth and breathe out normally. 8. Rest for a few seconds and repeat Steps 1 through 7 at least 10 times every 1-2 hours when you are awake. Take your time and take a few normal breaths between deep breaths. 9. The spirometer may include an indicator to show your best effort. Use the indicator as a goal to work toward during each repetition. 10. After each set of 10 deep breaths, practice coughing to be sure your lungs are clear. If you have an incision (the cut made at the time of surgery), support your incision when coughing by placing a pillow or rolled up towels firmly against it. Once you are  able to get out of bed, walk around indoors and cough well. You may stop using the incentive spirometer when instructed by your caregiver.  RISKS AND COMPLICATIONS  Take your time so you do not get dizzy or light-headed.  If you are in pain, you may need to take or ask for pain medication before doing incentive spirometry. It is harder to take a deep  breath if you are having pain. AFTER USE  Rest and breathe slowly and easily.  It can be helpful to keep track of a log of your progress. Your caregiver can provide you with a simple table to help with this. If you are using the spirometer at home, follow these instructions: [place] IF:   You are having difficultly using the spirometer.  You have trouble using the spirometer as often as instructed.  Your pain medication is not giving enough relief while using the spirometer.  You develop fever of 100.5 F (38.1 C) or higher. SEEK IMMEDIATE MEDICAL CARE IF:   You cough up bloody sputum that had not been present before.  You develop fever of 102 F (38.9 C) or greater.  You develop worsening pain at or near the incision site. MAKE SURE YOU:   Understand these instructions.  Will watch your condition.  Will get help right away if you are not doing well or get worse. Document Released: 07/31/2006 Document Revised: 06/12/2011 Document Reviewed: 10/01/2006 Exit[employer] Patient Information 2014 Exit[employer], [place].   ________________________________________________________________________  WHAT IS A BLOOD TRANSFUSION? Blood Transfusion Information  A transfusion is the replacement of blood or some of its parts. Blood is made up of multiple cells which provide different functions.  Red blood cells carry oxygen and are used for blood loss replacement.  White blood cells fight against infection.  Platelets control bleeding.  Plasma helps clot blood.  Other blood products are available for specialized needs, such as hemophilia or other clotting disorders. BEFORE THE TRANSFUSION  Who gives blood for transfusions?   Healthy volunteers who are fully evaluated to make sure their blood is safe. This is blood bank blood. Transfusion therapy is the safest it has ever been in the practice of medicine. Before blood is taken from a donor, a complete history is taken to make sure  that person has no history of diseases nor engages in risky social behavior (examples are intravenous drug use or sexual activity with multiple partners). The donor's travel history is screened to minimize risk of transmitting infections, such as malaria. The donated blood is tested for signs of infectious diseases, such as HIV and hepatitis. The blood is then tested to be sure it is compatible with you in order to minimize the chance of a transfusion reaction. If you or a relative donates blood, this is often done in anticipation of surgery and is not appropriate for emergency situations. It takes many days to process the donated blood. RISKS AND COMPLICATIONS Although transfusion therapy is very safe and saves many lives, the main dangers of transfusion include:   Getting an infectious disease.  Developing a transfusion reaction. This is an allergic reaction to something in the blood you were given. Every precaution is taken to prevent this. The decision to have a blood transfusion has been considered carefully by your caregiver before blood is given. Blood is not given unless the benefits outweigh the risks. AFTER THE TRANSFUSION  Right after receiving a blood transfusion, you will usually feel much better and more energetic. This is especially  true if your red blood cells have gotten low (anemic). The transfusion raises the level of the red blood cells which carry oxygen, and this usually causes an energy increase.  The nurse administering the transfusion will monitor you carefully for complications. HOME CARE INSTRUCTIONS  No special instructions are needed after a transfusion. You may find your energy is better. Speak with your caregiver about any limitations on activity for underlying diseases you may have. SEEK MEDICAL CARE IF:   Your condition is not improving after your transfusion.  You develop redness or irritation at the intravenous (IV) site. SEEK IMMEDIATE MEDICAL CARE IF:  Any of  the following symptoms occur over the next 12 hours:  Shaking chills.  You have a temperature by mouth above 102 F (38.9 C), not controlled by medicine.  Chest, back, or muscle pain.  People around you feel you are not acting correctly or are confused.  Shortness of breath or difficulty breathing.  Dizziness and fainting.  You get a rash or develop hives.  You have a decrease in urine output.  Your urine turns a dark color or changes to pink, red, or brown. Any of the following symptoms occur over the next 10 days:  You have a temperature by mouth above 102 F (38.9 C), not controlled by medicine.  Shortness of breath.  Weakness after normal activity.  The white part of the eye turns yellow (jaundice).  You have a decrease in the amount of urine or are urinating less often.  Your urine turns a dark color or changes to pink, red, or brown. Document Released: 03/17/2000 Document Revised: 06/12/2011 Document Reviewed: 11/04/2007 [facility] Patient Information 2014 [place], [place].  _______________________________________________________________________

## 2019-04-18 ENCOUNTER — Ambulatory Visit (HOSPITAL_COMMUNITY)
Admission: RE | Admit: 2019-04-18 | Discharge: 2019-04-18 | Disposition: A | Discharge status: Home / Self Care | Payer: Medicare HMO | Source: Ambulatory Visit | Attending: Orthopaedic Surgery | Admitting: Orthopaedic Surgery

## 2019-04-18 ENCOUNTER — Encounter (HOSPITAL_COMMUNITY)
Admission: RE | Admit: 2019-04-18 | Discharge: 2019-04-18 | Disposition: A | Discharge status: Home / Self Care | Payer: Medicare HMO | Source: Ambulatory Visit | Attending: Orthopaedic Surgery | Admitting: Orthopaedic Surgery

## 2019-04-18 ENCOUNTER — Other Ambulatory Visit (HOSPITAL_COMMUNITY)
Admission: RE | Admit: 2019-04-18 | Discharge: 2019-04-18 | Disposition: A | Discharge status: Home / Self Care | Payer: Medicare HMO | Source: Ambulatory Visit

## 2019-04-18 ENCOUNTER — Encounter (HOSPITAL_COMMUNITY): Payer: Self-pay

## 2019-04-18 ENCOUNTER — Other Ambulatory Visit: Payer: Self-pay

## 2019-04-18 DIAGNOSIS — M1711 Unilateral primary osteoarthritis, right knee: Secondary | ICD-10-CM | POA: Insufficient documentation

## 2019-04-18 DIAGNOSIS — Z01818 Encounter for other preprocedural examination: Secondary | ICD-10-CM

## 2019-04-18 DIAGNOSIS — Z01812 Encounter for preprocedural laboratory examination: Secondary | ICD-10-CM | POA: Insufficient documentation

## 2019-04-18 DIAGNOSIS — Z20822 Contact with and (suspected) exposure to covid-19: Secondary | ICD-10-CM | POA: Insufficient documentation

## 2019-04-18 HISTORY — DX: Unspecified cataract: H26.9

## 2019-04-18 HISTORY — DX: Respiratory tuberculosis unspecified: A15.9

## 2019-04-18 HISTORY — DX: Nausea with vomiting, unspecified: R11.2

## 2019-04-18 HISTORY — DX: Unspecified osteoarthritis, unspecified site: M19.90

## 2019-04-18 HISTORY — DX: Gastro-esophageal reflux disease without esophagitis: K21.9

## 2019-04-18 HISTORY — DX: Palpitations: R00.2

## 2019-04-18 HISTORY — DX: Other specified postprocedural states: Z98.890

## 2019-04-18 LAB — BASIC METABOLIC PANEL
Anion gap: 10 (ref 5–15)
BUN: 27 mg/dL — ABNORMAL HIGH (ref 8–23)
CO2: 28 mmol/L (ref 22–32)
Calcium: 10.4 mg/dL — ABNORMAL HIGH (ref 8.9–10.3)
Chloride: 103 mmol/L (ref 98–111)
Creatinine, Ser: 1.28 mg/dL — ABNORMAL HIGH (ref 0.44–1.00)
GFR calc Af Amer: 49 mL/min — ABNORMAL LOW (ref >60)
GFR calc non Af Amer: 42 mL/min — ABNORMAL LOW (ref >60)
Glucose, Bld: 124 mg/dL — ABNORMAL HIGH (ref 70–99)
Potassium: 3.9 mmol/L (ref 3.5–5.1)
Sodium: 141 mmol/L (ref 135–145)

## 2019-04-18 LAB — CBC WITH DIFFERENTIAL/PLATELET
Abs Immature Granulocytes: 0.02 10*3/uL (ref 0.00–0.07)
Basophils Absolute: 0 10*3/uL (ref 0.0–0.1)
Basophils Relative: 1 %
Eosinophils Absolute: 0.2 10*3/uL (ref 0.0–0.5)
Eosinophils Relative: 4 %
HCT: 42.2 % (ref 36.0–46.0)
Hemoglobin: 14.2 g/dL (ref 12.0–15.0)
Immature Granulocytes: 0 %
Lymphocytes Relative: 45 %
Lymphs Abs: 2.4 10*3/uL (ref 0.7–4.0)
MCH: 30.7 pg (ref 26.0–34.0)
MCHC: 33.6 g/dL (ref 30.0–36.0)
MCV: 91.3 fL (ref 80.0–100.0)
Monocytes Absolute: 0.2 10*3/uL (ref 0.1–1.0)
Monocytes Relative: 4 %
Neutro Abs: 2.5 10*3/uL (ref 1.7–7.7)
Neutrophils Relative %: 46 %
Platelets: 298 10*3/uL (ref 150–400)
RBC: 4.62 MIL/uL (ref 3.87–5.11)
RDW: 12.6 % (ref 11.5–15.5)
WBC: 5.4 10*3/uL (ref 4.0–10.5)
nRBC: 0 % (ref 0.0–0.2)

## 2019-04-18 LAB — PROTIME-INR
INR: 1 (ref 0.8–1.2)
Prothrombin Time: 13.2 seconds (ref 11.4–15.2)

## 2019-04-18 LAB — ABO/RH: ABO/RH(D): O POS

## 2019-04-18 LAB — SURGICAL PCR SCREEN
MRSA, PCR: NEGATIVE
Staphylococcus aureus: NEGATIVE

## 2019-04-18 LAB — APTT: aPTT: 26 seconds (ref 24–36)

## 2019-04-18 NOTE — H&P (Signed)
TOTAL KNEE ADMISSION H&P  Patient is being admitted for right total knee arthroplasty.  Subjective:  Chief Complaint:right knee pain.  HPI: Kari Coleman, 69 y.o. female, has a history of pain and functional disability in the right knee due to arthritis and has failed non-surgical conservative treatments for greater than 12 weeks to includeNSAID's and/or analgesics, corticosteriod injections, flexibility and strengthening excercises, use of assistive devices, weight reduction as appropriate and activity modification.  Onset of symptoms was gradual, starting 5 years ago with gradually worsening course since that time. The patient noted no past surgery on the right knee(s).  Patient currently rates pain in the right knee(s) at 10 out of 10 with activity. Patient has night pain, worsening of pain with activity and weight bearing, pain that interferes with activities of daily living, crepitus and joint swelling.  Patient has evidence of subchondral cysts, subchondral sclerosis, periarticular osteophytes and joint space narrowing by imaging studies.  There is no active infection.  Patient Active Problem List   Diagnosis Date Noted  . Abnormal EKG 04/14/2019  . Hypothyroidism 04/11/2019  . Elevated glucose 04/08/2019  . Chest discomfort 04/25/2018  . Ex-smoker 04/25/2018  . Primary osteoarthritis of right knee 01/04/2016  . Hypertension    Past Medical History:  Diagnosis Date  . Cervical cancer ([place])   . Glaucoma 2017  . History of echocardiogram    Echo 9/16:  Mild LVH, EF 55-60%, no RWMA, Gr 1 DD, mild MR // Echo 1/17:  Mild LVH, EF 50-55%, Gr 1 DD, mild to mod MR, mild LAE  . Hypertension   . Hyperthyroidism    s/p RAI treatment  . 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 Tobacco abuse   . Urinary incontinence     Past Surgical History:  Procedure Laterality Date  . REPAIR OF COMPLEX TRACTION RETINAL DETACHMENT    . TOTAL ABDOMINAL HYSTERECTOMY W/ BILATERAL  SALPINGOOPHORECTOMY      No current facility-administered medications for this encounter.   Current Outpatient Medications  Medication Sig Dispense Refill Last Dose  . albuterol (VENTOLIN HFA) 108 (90 Base) MCG/ACT inhaler Inhale 2 puffs into the lungs every 6 (six) hours as needed for wheezing or shortness of breath. 18 g 2   . amLODipine (NORVASC) 10 MG tablet Take 1 tablet (10 mg total) by mouth daily. (Patient taking differently: Take 10 mg by mouth every evening. ) 90 tablet 1   . aspirin EC 81 MG tablet Take 81 mg by mouth every evening.     . brimonidine (ALPHAGAN) 0.2 % ophthalmic solution Place 1 drop into the right eye 2 (two) times daily.     Marland Kitchen ibuprofen (ADVIL) 200 MG tablet Take 200 mg by mouth every 6 (six) hours as needed for headache or moderate pain.     Marland Kitchen latanoprost (XALATAN) 0.005 % ophthalmic solution Place 1 drop into both eyes at bedtime. As directed      . levothyroxine (SYNTHROID) 50 MCG tablet Take 1 tablet (50 mcg total) by mouth daily before breakfast. 90 tablet 0   . lisinopril-hydrochlorothiazide (ZESTORETIC) 20-25 MG tablet Take 1 tablet by mouth once daily (Patient taking differently: Take 1 tablet by mouth every evening. ) 90 tablet 0   . metoprolol succinate (TOPROL-XL) 100 MG 24 hr tablet Take 1 tablet (100 mg total) by mouth daily. Take with or immediately following a meal. (Patient taking differently: Take 100 mg by mouth every evening. Take with or immediately following a  meal.) 90 tablet 1   . pantoprazole (PROTONIX) 40 MG tablet Take 1 tablet (40 mg total) by mouth daily. 30 tablet 11    Allergies  Allergen Reactions  . Doxycycline Nausea Only  . Codeine Nausea And Vomiting  . Pollen Extract     Social History   Tobacco Use  . Smoking status: Former Smoker    Packs/day: 0.50    Years: 48.00    Pack years: 24.00    Types: Cigarettes    Start date: 11/30/1968    Quit date: 10/31/2016    Years since quitting: 2.4  . Smokeless tobacco: Never Used   . Tobacco comment: did  not smoke more than .5 pack   Substance Use Topics  . Alcohol use: No    Alcohol/week: 0.0 standard drinks    Comment: rarely     Family History  Problem Relation Age of Onset  . Hypertension Mother   . Heart failure Mother   . Diabetes Mother   . Pancreatic cancer Father   . Colon cancer Maternal Aunt   . Cancer Sister   . Thyroid disease Paternal Aunt   . Cancer Paternal Aunt   . Thyroid disease Other        paternal side  . Cancer Maternal Grandfather   . Diabetes Paternal Grandmother   . Diabetes Maternal Uncle      Review of Systems  Musculoskeletal: Positive for arthralgias.       Right knee  All other systems reviewed and are negative.   Objective:  Physical Exam  Constitutional: She is oriented to person, place, and time. She appears well-developed and well-nourished.  HENT:  Head: Normocephalic and atraumatic.  Eyes: Pupils are equal, round, and reactive to light.  Cardiovascular: Normal rate and regular rhythm.  Respiratory: Effort normal.  GI: Soft.  Musculoskeletal:     Cervical back: Normal range of motion.     Comments: Right knee motion is 0-110.  She has 1+ effusion.  She has a mild varus deformity.  The pain is along the medial joint line where she also has some crepitation.  Hip motion is full and straight leg raise is negative.  Sensation and motor function are intact in her feet with palpable pulses on both sides.    Neurological: She is alert and oriented to person, place, and time.  Skin: Skin is warm and dry.  Psychiatric: She has a normal mood and affect. Her behavior is normal. Judgment and thought content normal.    Vital signs in last 24 hours:    Labs:   Estimated body mass index is 33.05 kg/m as calculated from the following:   Height as of 04/15/19: 5\' 7"  (1.702 m).   Weight as of 04/15/19: 95.7 kg.   Imaging Review Plain radiographs demonstrate severe degenerative joint disease of the right knee(s).  The overall alignment isneutral. The bone quality appears to be good for age and reported activity level.      Assessment/Plan:  End stage primary arthritis, right knee   The patient history, physical examination, clinical judgment of the provider and imaging studies are consistent with end stage degenerative joint disease of the right knee(s) and total knee arthroplasty is deemed medically necessary. The treatment options including medical management, injection therapy arthroscopy and arthroplasty were discussed at length. The risks and benefits of total knee arthroplasty were presented and reviewed. The risks due to aseptic loosening, infection, stiffness, patella tracking problems, thromboembolic complications and other imponderables were discussed.  The patient acknowledged the explanation, agreed to proceed with the plan and consent was signed. Patient is being admitted for inpatient treatment for surgery, pain control, PT, OT, prophylactic antibiotics, VTE prophylaxis, progressive ambulation and ADL's and discharge planning. The patient is planning to be discharged home with home health services   Patient's anticipated LOS is less than 2 midnights, meeting these requirements: - Younger than 56 - Lives within 1 hour of care - Has a competent adult at home to recover with post-op recover - NO history of  - Chronic pain requiring opiods  - Diabetes  - Coronary Artery Disease  - Heart failure  - Heart attack  - Stroke  - DVT/VTE  - Cardiac arrhythmia  - Respiratory Failure/COPD  - Renal failure  - Anemia  - Advanced Liver disease

## 2019-04-18 NOTE — Progress Notes (Signed)
Anesthesia Chart Review   Case: [id number] Date/Time: 04/22/19 0929   Procedure: RIGHT TOTAL KNEE ARTHROPLASTY (Right Knee) - PENDING PRE-OP FOR SDDC APPROVAL   Anesthesia type: Spinal   Pre-op diagnosis: RIGHT KNEE DEGENERATIVE JOINT DISEASE   Location: [facility] ROOM 06 / [facility]   Surgeons: Melrose Nakayama, MD      DISCUSSION:68 y.o. former smoker (24 pack years, quit 10/31/16) with h/o PONV, GERD, HTN, hyperthyroidism, right knee djd scheduled for above procedure 04/22/19 with Dr. Melrose Nakayama.   Pt seen by cardiology, Dr. Shirlee More, 04/15/19 for preoperative evaluation.  Per OV note, "Planned procedure is elective intermediate risk and the patient's hypertension is stable.  She has no evidence of heart failure severe arrhythmia or obstructive CAD.  I think she requires any further preoperative cardiac testing in my opinion she is optimized.  I told her to stop aspirin 7 days prior to surgery.  Postoperatively like her to go to monitored bed the first 24 hours check an EKG postoperative day 1 and continue her usual antihypertensive medications."  Anticipate pt can proceed with planned procedure barring acute status change.   VS: There were no vitals taken for this visit.  PROVIDERS: Inda Coke, PA is PCP   Shirlee More, MD is Cardiologist  LABS: Labs reviewed: Acceptable for surgery. (all labs ordered are listed, but only abnormal results are displayed)  Labs Reviewed - No data to display   IMAGES:   EKG: 04/15/2019 Rate 63 bpm Normal sinus rhythm  Possible Anterior infarct, age undetermined ST & T abnormality, consider lateral ischemia   CV: Myocardial Perfusion 04/30/2018  Nuclear stress EF: 63%. The left ventricular ejection fraction is normal (55-65%).  There was no ST segment deviation noted during stress.  This is a low risk study. No evidence of infarction. No evidence of ischemia.  The study is normal.    Echo 11/23/2017 Study Conclusions  - Left  ventricle: The cavity size was normal. There was mild   concentric hypertrophy. Systolic function was vigorous. The   estimated ejection fraction was in the range of 65% to 70%. Wall   motion was normal; there were no regional wall motion   abnormalities. Doppler parameters are consistent with abnormal   left ventricular relaxation (grade 1 diastolic dysfunction).   Doppler parameters are consistent with elevated ventricular   end-diastolic filling pressure. - Aortic valve: There was no regurgitation. - Aortic root: The aortic root was normal in size. - Mitral valve: There was mild regurgitation. - Left atrium: The atrium was normal in size. - Right ventricle: Systolic function was normal. - Right atrium: The atrium was normal in size. - Tricuspid valve: There was trivial regurgitation. - Pulmonary arteries: Systolic pressure was within the normal   range. - Inferior vena cava: The vessel was normal in size. - Pericardium, extracardiac: There was no pericardial effusion. Past Medical History:  Diagnosis Date  . Arthritis   . Cataract   . Cervical cancer ([place])   . GERD (gastroesophageal reflux disease)   . Glaucoma 2017  . History of echocardiogram    Echo 9/16:  Mild LVH, EF 55-60%, no RWMA, Gr 1 DD, mild MR // Echo 1/17:  Mild LVH, EF 50-55%, Gr 1 DD, mild to mod MR, mild LAE  . Hypertension   . Hyperthyroidism    s/p RAI treatment  . Palpitations   . PONV (postoperative nausea and vomiting)    years ago  . Woke up and saw asian people   .  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 Tobacco abuse   . Tuberculosis    positive test as a caregiver cxr annually   . Urinary incontinence     Past Surgical History:  Procedure Laterality Date  . ABDOMINAL HYSTERECTOMY    . bladder surgey for incontinence    . EYE SURGERY    . REPAIR OF COMPLEX TRACTION RETINAL DETACHMENT    . thyroid radiation    . TOTAL ABDOMINAL HYSTERECTOMY W/ BILATERAL SALPINGOOPHORECTOMY       MEDICATIONS: No current facility-administered medications for this encounter.   Marland Kitchen albuterol (VENTOLIN HFA) 108 (90 Base) MCG/ACT inhaler  . amLODipine (NORVASC) 10 MG tablet  . aspirin EC 81 MG tablet  . brimonidine (ALPHAGAN) 0.2 % ophthalmic solution  . ibuprofen (ADVIL) 200 MG tablet  . latanoprost (XALATAN) 0.005 % ophthalmic solution  . levothyroxine (SYNTHROID) 50 MCG tablet  . lisinopril-hydrochlorothiazide (ZESTORETIC) 20-25 MG tablet  . metoprolol succinate (TOPROL-XL) 100 MG 24 hr tablet  . pantoprazole (PROTONIX) 40 MG tablet    Maia Plan [facility] Pre-Surgical Testing [phone] 04/18/19  2:29 PM

## 2019-04-19 LAB — NOVEL CORONAVIRUS, NAA (HOSP ORDER, SEND-OUT TO REF LAB; TAT 18-24 HRS): SARS-CoV-2, NAA: NOT DETECTED

## 2019-04-20 NOTE — Care Plan (Signed)
Ortho Bundle Case Management Note  Patient Details  Name: Kari Coleman MRN: [id number] Date of Birth: 1950/10/14  Patient plans to discharge to home with family. [facility]. Outpatient referral to [facility] and 3n1 ordered from [place].  Patient and MD in agreement with plan. Choice offered.                   DME Arranged:  Walker rolling, Bedside commode DME Agency:  [employer]  HH Arranged:  PT [place] Agency:  [facility])  Additional Comments: Please contact me with any questions of if this plan should need to change.  Ladell Heads,  [place] [facility]  [phone] 04/20/2019, 9:33 PM

## 2019-04-21 ENCOUNTER — Encounter (HOSPITAL_COMMUNITY): Payer: Self-pay | Admitting: Orthopaedic Surgery

## 2019-04-21 MED ORDER — TRANEXAMIC ACID 1000 MG/10ML IV SOLN
2000.0000 mg | INTRAVENOUS | Status: DC
  Filled 2019-04-21: qty 20

## 2019-04-21 MED ORDER — BUPIVACAINE LIPOSOME 1.3 % IJ SUSP
20.0000 mL | Freq: Once | INTRAMUSCULAR | Status: DC
  Filled 2019-04-21: qty 20

## 2019-04-22 ENCOUNTER — Ambulatory Visit (HOSPITAL_COMMUNITY): Payer: Medicare HMO | Admitting: Physician Assistant

## 2019-04-22 ENCOUNTER — Encounter (HOSPITAL_COMMUNITY): Payer: Self-pay | Admitting: Orthopaedic Surgery

## 2019-04-22 ENCOUNTER — Encounter (HOSPITAL_COMMUNITY)
Admission: RE | Disposition: A | Discharge status: Home / Self Care | Payer: Self-pay | Source: Ambulatory Visit | Attending: Orthopaedic Surgery

## 2019-04-22 ENCOUNTER — Observation Stay (HOSPITAL_COMMUNITY)
Admission: RE | Admit: 2019-04-22 | Discharge: 2019-04-22 | Disposition: A | Discharge status: Home / Self Care | Payer: Medicare HMO | Source: Ambulatory Visit | Attending: Orthopaedic Surgery | Admitting: Orthopaedic Surgery

## 2019-04-22 DIAGNOSIS — I10 Essential (primary) hypertension: Secondary | ICD-10-CM | POA: Insufficient documentation

## 2019-04-22 DIAGNOSIS — Z87891 Personal history of nicotine dependence: Secondary | ICD-10-CM | POA: Insufficient documentation

## 2019-04-22 DIAGNOSIS — E039 Hypothyroidism, unspecified: Secondary | ICD-10-CM | POA: Insufficient documentation

## 2019-04-22 DIAGNOSIS — Z79890 Hormone replacement therapy (postmenopausal): Secondary | ICD-10-CM | POA: Diagnosis not present

## 2019-04-22 DIAGNOSIS — Z79899 Other long term (current) drug therapy: Secondary | ICD-10-CM | POA: Insufficient documentation

## 2019-04-22 DIAGNOSIS — Z8541 Personal history of malignant neoplasm of cervix uteri: Secondary | ICD-10-CM | POA: Diagnosis not present

## 2019-04-22 DIAGNOSIS — Z833 Family history of diabetes mellitus: Secondary | ICD-10-CM | POA: Diagnosis not present

## 2019-04-22 DIAGNOSIS — H409 Unspecified glaucoma: Secondary | ICD-10-CM | POA: Diagnosis not present

## 2019-04-22 DIAGNOSIS — G8918 Other acute postprocedural pain: Secondary | ICD-10-CM | POA: Diagnosis not present

## 2019-04-22 DIAGNOSIS — M1711 Unilateral primary osteoarthritis, right knee: Secondary | ICD-10-CM | POA: Diagnosis not present

## 2019-04-22 DIAGNOSIS — Z8249 Family history of ischemic heart disease and other diseases of the circulatory system: Secondary | ICD-10-CM | POA: Insufficient documentation

## 2019-04-22 DIAGNOSIS — Z7982 Long term (current) use of aspirin: Secondary | ICD-10-CM | POA: Diagnosis not present

## 2019-04-22 DIAGNOSIS — K219 Gastro-esophageal reflux disease without esophagitis: Secondary | ICD-10-CM | POA: Diagnosis not present

## 2019-04-22 DIAGNOSIS — Z8349 Family history of other endocrine, nutritional and metabolic diseases: Secondary | ICD-10-CM | POA: Diagnosis not present

## 2019-04-22 DIAGNOSIS — E0590 Thyrotoxicosis, unspecified without thyrotoxic crisis or storm: Secondary | ICD-10-CM | POA: Diagnosis not present

## 2019-04-22 HISTORY — PX: TOTAL KNEE ARTHROPLASTY: SHX125

## 2019-04-22 LAB — TYPE AND SCREEN
ABO/RH(D): O POS
Antibody Screen: NEGATIVE

## 2019-04-22 SURGERY — ARTHROPLASTY, KNEE, TOTAL
Anesthesia: Spinal | Site: Knee | Laterality: Right

## 2019-04-22 MED ORDER — LACTATED RINGERS IV SOLN: INTRAVENOUS | Status: DC

## 2019-04-22 MED ORDER — CHLORHEXIDINE GLUCONATE 4 % EX LIQD: 60.0000 mL | Freq: Once | CUTANEOUS | Status: DC

## 2019-04-22 MED ORDER — PHENYLEPHRINE HCL (PRESSORS) 10 MG/ML IV SOLN
INTRAVENOUS | Status: AC
  Filled 2019-04-22: qty 2

## 2019-04-22 MED ORDER — TRANEXAMIC ACID-NACL 1000-0.7 MG/100ML-% IV SOLN
1000.0000 mg | INTRAVENOUS | Status: AC
  Administered 2019-04-22: 1000 mg via INTRAVENOUS

## 2019-04-22 MED ORDER — ALBUTEROL SULFATE (2.5 MG/3ML) 0.083% IN NEBU: 2.5000 mg | INHALATION_SOLUTION | Freq: Four times a day (QID) | RESPIRATORY_TRACT | Status: DC | PRN

## 2019-04-22 MED ORDER — BUPIVACAINE IN DEXTROSE 0.75-8.25 % IT SOLN
INTRATHECAL | Status: DC | PRN
  Administered 2019-04-22: 1.5 mL via INTRATHECAL

## 2019-04-22 MED ORDER — ONDANSETRON HCL 4 MG PO TABS
4.0000 mg | ORAL_TABLET | Freq: Four times a day (QID) | ORAL | Status: DC | PRN
  Filled 2019-04-22: qty 1

## 2019-04-22 MED ORDER — MENTHOL 3 MG MT LOZG: 1.0000 | LOZENGE | OROMUCOSAL | Status: DC | PRN

## 2019-04-22 MED ORDER — TRANEXAMIC ACID-NACL 1000-0.7 MG/100ML-% IV SOLN
1000.0000 mg | Freq: Once | INTRAVENOUS | Status: AC
  Administered 2019-04-22: 1000 mg via INTRAVENOUS

## 2019-04-22 MED ORDER — CEFAZOLIN SODIUM-DEXTROSE 2-4 GM/100ML-% IV SOLN: 2.0000 g | Freq: Four times a day (QID) | INTRAVENOUS | Status: DC

## 2019-04-22 MED ORDER — 0.9 % SODIUM CHLORIDE (POUR BTL) OPTIME
TOPICAL | Status: DC | PRN
  Administered 2019-04-22: 1000 mL

## 2019-04-22 MED ORDER — POVIDONE-IODINE 10 % EX SWAB
2.0000 "application " | Freq: Once | CUTANEOUS | Status: AC
  Administered 2019-04-22: 2 via TOPICAL

## 2019-04-22 MED ORDER — ALUM & MAG HYDROXIDE-SIMETH 200-200-20 MG/5ML PO SUSP
30.0000 mL | ORAL | Status: DC | PRN
  Filled 2019-04-22: qty 30

## 2019-04-22 MED ORDER — MIDAZOLAM HCL 2 MG/2ML IJ SOLN
1.0000 mg | Freq: Once | INTRAMUSCULAR | Status: DC
  Filled 2019-04-22: qty 2

## 2019-04-22 MED ORDER — TRANEXAMIC ACID-NACL 1000-0.7 MG/100ML-% IV SOLN
INTRAVENOUS | Status: AC
  Filled 2019-04-22: qty 100

## 2019-04-22 MED ORDER — LEVOTHYROXINE SODIUM 50 MCG PO TABS: 50.0000 ug | ORAL_TABLET | Freq: Every day | ORAL | Status: DC

## 2019-04-22 MED ORDER — LISINOPRIL-HYDROCHLOROTHIAZIDE 20-25 MG PO TABS: 1.0000 | ORAL_TABLET | Freq: Every evening | ORAL | Status: DC

## 2019-04-22 MED ORDER — HYDROMORPHONE HCL 1 MG/ML IJ SOLN: 0.2500 mg | INTRAMUSCULAR | Status: DC | PRN

## 2019-04-22 MED ORDER — BUPIVACAINE LIPOSOME 1.3 % IJ SUSP
INTRAMUSCULAR | Status: DC | PRN
  Administered 2019-04-22: 20 mL

## 2019-04-22 MED ORDER — MORPHINE SULFATE (PF) 4 MG/ML IV SOLN: 0.5000 mg | INTRAVENOUS | Status: DC | PRN

## 2019-04-22 MED ORDER — PHENYLEPHRINE HCL-NACL 10-0.9 MG/250ML-% IV SOLN
INTRAVENOUS | Status: DC | PRN
  Administered 2019-04-22: 15 ug/min via INTRAVENOUS

## 2019-04-22 MED ORDER — LACTATED RINGERS IV BOLUS
500.0000 mL | Freq: Once | INTRAVENOUS | Status: AC
  Administered 2019-04-22: 500 mL via INTRAVENOUS

## 2019-04-22 MED ORDER — BISACODYL 5 MG PO TBEC
5.0000 mg | DELAYED_RELEASE_TABLET | Freq: Every day | ORAL | Status: DC | PRN
  Filled 2019-04-22: qty 1

## 2019-04-22 MED ORDER — BRIMONIDINE TARTRATE 0.2 % OP SOLN: 1.0000 [drp] | Freq: Two times a day (BID) | OPHTHALMIC | Status: DC

## 2019-04-22 MED ORDER — AMLODIPINE BESYLATE 10 MG PO TABS: 10.0000 mg | ORAL_TABLET | Freq: Every evening | ORAL | Status: DC

## 2019-04-22 MED ORDER — MEPERIDINE HCL 50 MG/ML IJ SOLN: 6.2500 mg | INTRAMUSCULAR | Status: DC | PRN

## 2019-04-22 MED ORDER — DEXAMETHASONE SODIUM PHOSPHATE 10 MG/ML IJ SOLN
INTRAMUSCULAR | Status: DC | PRN
  Administered 2019-04-22: 6 mg via INTRAVENOUS

## 2019-04-22 MED ORDER — ASPIRIN EC 81 MG PO TBEC: 81.0000 mg | DELAYED_RELEASE_TABLET | Freq: Two times a day (BID) | ORAL | 0 refills | Status: DC

## 2019-04-22 MED ORDER — LACTATED RINGERS IV BOLUS: 250.0000 mL | Freq: Once | INTRAVENOUS | Status: DC

## 2019-04-22 MED ORDER — HYDROCODONE-ACETAMINOPHEN 5-325 MG PO TABS: 1.0000 | ORAL_TABLET | ORAL | Status: DC | PRN

## 2019-04-22 MED ORDER — PHENOL 1.4 % MT LIQD: 1.0000 | OROMUCOSAL | Status: DC | PRN

## 2019-04-22 MED ORDER — SODIUM CHLORIDE 0.9 % IR SOLN
Status: DC | PRN
  Administered 2019-04-22: 3000 mL

## 2019-04-22 MED ORDER — ACETAMINOPHEN 325 MG PO TABS: 325.0000 mg | ORAL_TABLET | Freq: Four times a day (QID) | ORAL | Status: DC | PRN

## 2019-04-22 MED ORDER — ONDANSETRON HCL 4 MG/2ML IJ SOLN: 4.0000 mg | Freq: Four times a day (QID) | INTRAMUSCULAR | Status: DC | PRN

## 2019-04-22 MED ORDER — HYDROCODONE-ACETAMINOPHEN 7.5-325 MG PO TABS: 1.0000 | ORAL_TABLET | ORAL | Status: DC | PRN

## 2019-04-22 MED ORDER — PROPOFOL 500 MG/50ML IV EMUL
INTRAVENOUS | Status: AC
  Filled 2019-04-22: qty 50

## 2019-04-22 MED ORDER — BUPIVACAINE HCL (PF) 0.5 % IJ SOLN
INTRAMUSCULAR | Status: DC | PRN
  Administered 2019-04-22: 20 mL

## 2019-04-22 MED ORDER — METOCLOPRAMIDE HCL 5 MG PO TABS
5.0000 mg | ORAL_TABLET | Freq: Three times a day (TID) | ORAL | Status: DC | PRN
  Filled 2019-04-22: qty 2

## 2019-04-22 MED ORDER — TRANEXAMIC ACID 1000 MG/10ML IV SOLN
INTRAVENOUS | Status: DC | PRN
  Administered 2019-04-22: 2000 mg via TOPICAL

## 2019-04-22 MED ORDER — METOPROLOL SUCCINATE ER 100 MG PO TB24: 100.0000 mg | ORAL_TABLET | Freq: Every evening | ORAL | Status: DC

## 2019-04-22 MED ORDER — ASPIRIN 81 MG PO CHEW: 81.0000 mg | CHEWABLE_TABLET | Freq: Two times a day (BID) | ORAL | Status: DC

## 2019-04-22 MED ORDER — LATANOPROST 0.005 % OP SOLN: 1.0000 [drp] | Freq: Every day | OPHTHALMIC | Status: DC

## 2019-04-22 MED ORDER — PROPOFOL 10 MG/ML IV BOLUS
INTRAVENOUS | Status: DC | PRN
  Administered 2019-04-22: 75 ug/kg/min via INTRAVENOUS
  Administered 2019-04-22: 10 mg via INTRAVENOUS

## 2019-04-22 MED ORDER — KETOROLAC TROMETHAMINE 15 MG/ML IJ SOLN: 7.5000 mg | Freq: Four times a day (QID) | INTRAMUSCULAR | Status: DC

## 2019-04-22 MED ORDER — SODIUM CHLORIDE (PF) 0.9 % IJ SOLN
INTRAMUSCULAR | Status: DC | PRN
  Administered 2019-04-22: 30 mL

## 2019-04-22 MED ORDER — FENTANYL CITRATE (PF) 100 MCG/2ML IJ SOLN
50.0000 ug | Freq: Once | INTRAMUSCULAR | Status: AC
  Administered 2019-04-22: 50 ug via INTRAVENOUS
  Filled 2019-04-22: qty 2

## 2019-04-22 MED ORDER — BUPIVACAINE-EPINEPHRINE 0.25% -1:200000 IJ SOLN
INTRAMUSCULAR | Status: DC | PRN
  Administered 2019-04-22: 25 mL

## 2019-04-22 MED ORDER — SODIUM CHLORIDE (PF) 0.9 % IJ SOLN
INTRAMUSCULAR | Status: AC
  Filled 2019-04-22: qty 50

## 2019-04-22 MED ORDER — DIPHENHYDRAMINE HCL 12.5 MG/5ML PO ELIX
12.5000 mg | ORAL_SOLUTION | ORAL | Status: DC | PRN
  Filled 2019-04-22: qty 10

## 2019-04-22 MED ORDER — METHOCARBAMOL 500 MG IVPB - SIMPLE MED
500.0000 mg | Freq: Four times a day (QID) | INTRAVENOUS | Status: DC | PRN
  Administered 2019-04-22: 500 mg via INTRAVENOUS

## 2019-04-22 MED ORDER — TRANEXAMIC ACID-NACL 1000-0.7 MG/100ML-% IV SOLN
1000.0000 mg | INTRAVENOUS | Status: AC
  Filled 2019-04-22: qty 100

## 2019-04-22 MED ORDER — STERILE WATER FOR IRRIGATION IR SOLN
Status: DC | PRN
  Administered 2019-04-22: 2000 mL

## 2019-04-22 MED ORDER — METOCLOPRAMIDE HCL 5 MG/ML IJ SOLN: 5.0000 mg | Freq: Three times a day (TID) | INTRAMUSCULAR | Status: DC | PRN

## 2019-04-22 MED ORDER — DOCUSATE SODIUM 100 MG PO CAPS: 100.0000 mg | ORAL_CAPSULE | Freq: Two times a day (BID) | ORAL | Status: DC

## 2019-04-22 MED ORDER — PROMETHAZINE HCL 25 MG/ML IJ SOLN: 6.2500 mg | INTRAMUSCULAR | Status: DC | PRN

## 2019-04-22 MED ORDER — TIZANIDINE HCL 4 MG PO TABS: 4.0000 mg | ORAL_TABLET | Freq: Four times a day (QID) | ORAL | 1 refills | Status: DC | PRN

## 2019-04-22 MED ORDER — ONDANSETRON HCL 4 MG/2ML IJ SOLN
INTRAMUSCULAR | Status: DC | PRN
  Administered 2019-04-22: 4 mg via INTRAVENOUS

## 2019-04-22 MED ORDER — METHOCARBAMOL 500 MG PO TABS: 500.0000 mg | ORAL_TABLET | Freq: Four times a day (QID) | ORAL | Status: DC | PRN

## 2019-04-22 MED ORDER — CLONIDINE HCL (ANALGESIA) 100 MCG/ML EP SOLN
EPIDURAL | Status: DC | PRN
  Administered 2019-04-22: 75 ug

## 2019-04-22 MED ORDER — METHOCARBAMOL 500 MG IVPB - SIMPLE MED
INTRAVENOUS | Status: AC
  Filled 2019-04-22: qty 50

## 2019-04-22 MED ORDER — CEFAZOLIN SODIUM-DEXTROSE 2-4 GM/100ML-% IV SOLN
2.0000 g | INTRAVENOUS | Status: AC
  Administered 2019-04-22: 2 g via INTRAVENOUS
  Filled 2019-04-22: qty 100

## 2019-04-22 MED ORDER — HYDROCODONE-ACETAMINOPHEN 5-325 MG PO TABS: 1.0000 | ORAL_TABLET | Freq: Four times a day (QID) | ORAL | 0 refills | Status: DC | PRN

## 2019-04-22 MED ORDER — LACTATED RINGERS IV BOLUS
250.0000 mL | Freq: Once | INTRAVENOUS | Status: AC
  Administered 2019-04-22: 250 mL via INTRAVENOUS

## 2019-04-22 MED ORDER — ACETAMINOPHEN 500 MG PO TABS: 500.0000 mg | ORAL_TABLET | Freq: Four times a day (QID) | ORAL | Status: DC

## 2019-04-22 MED ORDER — LIDOCAINE 2% (20 MG/ML) 5 ML SYRINGE
INTRAMUSCULAR | Status: DC | PRN
  Administered 2019-04-22: 60 mg via INTRAVENOUS
  Administered 2019-04-22: 40 mg via INTRAVENOUS

## 2019-04-22 SURGICAL SUPPLY — 53 items
ATTUNE MED DOME PAT 38 KNEE (Knees) ×1 IMPLANT
ATTUNE PS FEM RT SZ 6 CEM KNEE (Femur) ×1 IMPLANT
ATTUNE PSRP INSR SZ6 7 KNEE (Insert) ×1 IMPLANT
BAG DECANTER FOR FLEXI CONT (MISCELLANEOUS) ×2 IMPLANT
BAG SPEC THK2 15X12 ZIP CLS (MISCELLANEOUS) ×1
BAG ZIPLOCK 12X15 (MISCELLANEOUS) ×2 IMPLANT
BASE TIBIA ATTUNE KNEE SYS SZ6 (Knees) IMPLANT
BLADE SAGITTAL 25.0X1.19X90 (BLADE) ×2 IMPLANT
BLADE SAW SGTL 11.0X1.19X90.0M (BLADE) ×2 IMPLANT
BNDG ELASTIC 6X5.8 VLCR STR LF (GAUZE/BANDAGES/DRESSINGS) ×2 IMPLANT
BOOTIES KNEE HIGH SLOAN (MISCELLANEOUS) ×2 IMPLANT
BOWL SMART MIX CTS (DISPOSABLE) ×2 IMPLANT
BSPLAT TIB 6 CMNT ROT PLAT STR (Knees) ×1 IMPLANT
CEMENT HV SMART SET (Cement) ×4 IMPLANT
COVER SURGICAL LIGHT HANDLE (MISCELLANEOUS) ×2 IMPLANT
COVER WAND RF STERILE (DRAPES) ×2 IMPLANT
CUFF TOURN SGL QUICK 34 (TOURNIQUET CUFF) ×2
CUFF TRNQT CYL 34X4.125X (TOURNIQUET CUFF) ×1 IMPLANT
DECANTER SPIKE VIAL GLASS SM (MISCELLANEOUS) ×4 IMPLANT
DRAPE SHEET LG 3/4 BI-LAMINATE (DRAPES) ×2 IMPLANT
DRAPE TOP 10253 STERILE (DRAPES) ×2 IMPLANT
DRAPE U-SHAPE 47X51 STRL (DRAPES) ×2 IMPLANT
DRESSING AQUACEL AG SP 3.5X10 (GAUZE/BANDAGES/DRESSINGS) IMPLANT
DRSG AQUACEL AG ADV 3.5X10 (GAUZE/BANDAGES/DRESSINGS) ×2 IMPLANT
DRSG AQUACEL AG SP 3.5X10 (GAUZE/BANDAGES/DRESSINGS) ×2
DURAPREP 26ML APPLICATOR (WOUND CARE) ×4 IMPLANT
ELECT REM PT RETURN 15FT ADLT (MISCELLANEOUS) ×2 IMPLANT
GLOVE BIO SURGEON STRL SZ8 (GLOVE) ×4 IMPLANT
GLOVE BIOGEL PI IND STRL 8 (GLOVE) ×2 IMPLANT
GLOVE BIOGEL PI INDICATOR 8 (GLOVE) ×2
GOWN STRL REUS W/TWL XL LVL3 (GOWN DISPOSABLE) ×4 IMPLANT
HANDPIECE INTERPULSE COAX TIP (DISPOSABLE) ×2
HOLDER FOLEY CATH W/STRAP (MISCELLANEOUS) IMPLANT
HOOD PEEL AWAY FLYTE STAYCOOL (MISCELLANEOUS) ×6 IMPLANT
KIT TURNOVER KIT A (KITS) IMPLANT
MANIFOLD NEPTUNE II (INSTRUMENTS) ×2 IMPLANT
NS IRRIG 1000ML POUR BTL (IV SOLUTION) ×2 IMPLANT
PACK TOTAL KNEE CUSTOM (KITS) ×2 IMPLANT
PAD ARMBOARD 7.5X6 YLW CONV (MISCELLANEOUS) ×2 IMPLANT
PENCIL SMOKE EVACUATOR (MISCELLANEOUS) ×1 IMPLANT
PIN DRILL FIX HALF THREAD (BIT) ×1 IMPLANT
PIN STEINMAN FIXATION KNEE (PIN) ×1 IMPLANT
PROTECTOR NERVE ULNAR (MISCELLANEOUS) ×2 IMPLANT
SET HNDPC FAN SPRY TIP SCT (DISPOSABLE) ×1 IMPLANT
SUT ETHIBOND NAB CT1 #1 30IN (SUTURE) ×4 IMPLANT
SUT VIC AB 0 CT1 36 (SUTURE) ×2 IMPLANT
SUT VIC AB 2-0 CT1 27 (SUTURE) ×2
SUT VIC AB 2-0 CT1 TAPERPNT 27 (SUTURE) ×1 IMPLANT
SUT VICRYL AB 3-0 FS1 BRD 27IN (SUTURE) ×2 IMPLANT
TIBIA ATTUNE KNEE SYS BASE SZ6 (Knees) ×2 IMPLANT
TRAY FOLEY MTR SLVR 16FR STAT (SET/KITS/TRAYS/PACK) ×1 IMPLANT
WATER STERILE IRR 1000ML POUR (IV SOLUTION) ×2 IMPLANT
WRAP KNEE MAXI GEL POST OP (GAUZE/BANDAGES/DRESSINGS) ×2 IMPLANT

## 2019-04-22 NOTE — Progress Notes (Signed)
Assisted Dr. Germeroth with right, ultrasound guided, adductor canal block. Side rails up, monitors on throughout procedure. See vital signs in flow sheet. Tolerated Procedure well. 

## 2019-04-22 NOTE — Op Note (Signed)
PREOP DIAGNOSIS: DJD RIGHT KNEE POSTOP DIAGNOSIS: same PROCEDURE: RIGHT TKR ANESTHESIA: Spinal and MAC ATTENDING SURGEON: Hessie Dibble ASSISTANT: Loni Dolly PA  INDICATIONS FOR PROCEDURE: Kari Coleman is a 69 y.o. female who has struggled for a long time with pain due to degenerative arthritis of the right knee.  The patient has failed many conservative non-operative measures and at this point has pain which limits the ability to sleep and walk.  The patient is offered total knee replacement.  Informed operative consent was obtained after discussion of possible risks of anesthesia, infection, neurovascular injury, DVT, and death.  The importance of the post-operative rehabilitation protocol to optimize result was stressed extensively with the patient.  SUMMARY OF FINDINGS AND PROCEDURE:  Kari Coleman was taken to the operative suite where under the above anesthesia a right knee replacement was performed.  There were advanced degenerative changes and the bone quality was good.  We used the DePuy Attune system and placed size 6 femur, 6 tibia, 38 mm all polyethylene patella, and a size 7 mm spacer.  Loni Dolly PA-C assisted throughout and was invaluable to the completion of the case in that he helped retract and maintain exposure while I placed components.  He also helped close thereby minimizing OR time.  The patient was admitted for appropriate post-op care to include perioperative antibiotics and mechanical and pharmacologic measures for DVT prophylaxis.  DESCRIPTION OF PROCEDURE:  Kari Coleman was taken to the operative suite where the above anesthesia was applied.  The patient was positioned supine and prepped and draped in normal sterile fashion.  An appropriate time out was performed.  After the administration of kefzol pre-op antibiotic the leg was elevated and exsanguinated and a tourniquet inflated. A standard longitudinal incision was made on the anterior knee.  Dissection was carried  down to the extensor mechanism.  All appropriate anti-infective measures were used including the pre-operative antibiotic, betadine impregnated drape, and closed hooded exhaust systems for each member of the surgical team.  A medial parapatellar incision was made in the extensor mechanism and the knee cap flipped and the knee flexed.  Some residual meniscal tissues were removed along with any remaining ACL/PCL tissue.  A guide was placed on the tibia and a flat cut was made on it's superior surface.  An intramedullary guide was placed in the femur and was utilized to make anterior and posterior cuts creating an appropriate flexion gap.  A second intramedullary guide was placed in the femur to make a distal cut properly balancing the knee with an extension gap equal to the flexion gap.  The three bones sized to the above mentioned sizes and the appropriate guides were placed and utilized.  A trial reduction was done and the knee easily came to full extension and the patella tracked well on flexion.  The trial components were removed and all bones were cleaned with pulsatile lavage and then dried thoroughly.  Cement was mixed and was pressurized onto the bones followed by placement of the aforementioned components.  Excess cement was trimmed and pressure was held on the components until the cement had hardened.  The tourniquet was deflated and a small amount of bleeding was controlled with cautery and pressure.  The knee was irrigated thoroughly.  The extensor mechanism was re-approximated with #1 ethibond in interrupted fashion.  The knee was flexed and the repair was solid.  The subcutaneous tissues were re-approximated with #0 and #2-0 vicryl and the skin closed with  a subcuticular stitch and steristrips.  A sterile dressing was applied.  Intraoperative fluids, EBL, and tourniquet time can be obtained from anesthesia records.  DISPOSITION:  The patient was taken to recovery room in stable condition and admitted  for appropriate post-op care to include peri-operative antibiotic and DVT prophylaxis with mechanical and pharmacologic measures.  Hessie Dibble 04/22/2019, 11:36 AM

## 2019-04-22 NOTE — Transfer of Care (Signed)
Immediate Anesthesia Transfer of Care Note  Patient: Kari Coleman  Procedure(s) Performed: Procedure(s) with comments: RIGHT TOTAL KNEE ARTHROPLASTY (Right) - PENDING PRE-OP FOR SDDC APPROVAL  Patient Location: PACU  Anesthesia Type:Spinal  Level of Consciousness:  sedated, patient cooperative and responds to stimulation  Airway & Oxygen Therapy:Patient Spontanous Breathing and Patient connected to face mask oxgen  Post-op Assessment:  Report given to PACU RN and Post -op Vital signs reviewed and stable  Post vital signs:  Reviewed and stable  Last Vitals:  Vitals:   04/22/19 0916 04/22/19 0917  BP:  (!) 166/94  Pulse: (!) 56 (!) 54  Resp: 17 13  Temp:    SpO2: 123XX123 123XX123    Complications: No apparent anesthesia complications

## 2019-04-22 NOTE — Evaluation (Signed)
Physical Therapy Evaluation Patient Details Name: Kari Coleman MRN: [id number] DOB: September 21, 1950 Today's Date: 04/22/2019   History of Present Illness  Patient is 69 y.o. female s/p Rt TKA on 04/22/19 with PMH significant for hyperthyroidism, HTN, GERD, OA, cervical cancer.    Clinical Impression  TEISHA Coleman is a 69 y.o. female POD 0 s/p Rt TKA. Patient reports modified independence with SPC for mobility at baseline. Patient is now limited by functional impairments (see PT problem list below) and requires min guard/supervisino for transfers and gait with RW. Patient was able to ambulate ~90 feet with RW and min guard and cues for safe walker management. Patient educated on safe sequencing for stair mobility and verbalized safe guarding position for people assisting with mobility. Patient instructed in exercises to facilitate ROM and circulation. Patient will benefit from continued skilled PT interventions to address impairments and progress towards PLOF. Patient has met mobility goals at adequate level for discharge home; will continue to follow if pt continues acute stay to progress towards Mod I goals.     Follow Up Recommendations Follow surgeon's recommendation for DC plan and follow-up therapies    Equipment Recommendations  Rolling walker with 5" wheels;3in1 (PT)(delivered to pt in PACU)    Recommendations for Other Services       Precautions / Restrictions Precautions Precautions: Fall Restrictions Weight Bearing Restrictions: No      Mobility  Bed Mobility Overal bed mobility: Needs Assistance Bed Mobility: Supine to Sit;Sit to Supine     Supine to sit: HOB elevated;Supervision Sit to supine: HOB elevated;Supervision   General bed mobility comments: educated on use of gait belt for Rt LE mobility, no assist required  Transfers Overall transfer level: Needs assistance Equipment used: Rolling walker (2 wheeled) Transfers: Sit to/from [facility] Sit to  Stand: Supervision Stand pivot transfers: Supervision;Min guard       General transfer comment: cues for safe hand placement and technique with RW, cues for safe step pattern and positioning of RW for stand step/pivot transfer bed<>BSC  Ambulation/Gait Ambulation/Gait assistance: Min guard;Supervision Gait Distance (Feet): 90 Feet Assistive device: Rolling walker (2 wheeled) Gait Pattern/deviations: Step-to pattern;Step-through pattern;Decreased stride length;Decreased step length - left;Decreased stance time - right Gait velocity: slow   General Gait Details: cues for safe step pattern and proximity to RW, no overt LOB, pt slow to [employer] Stairs: Yes   Stair Management: Step to pattern;Forwards;Backwards;With walker;One rail Right;With cane Number of Stairs: 7(2x3, 1) General stair comments: cues for safe step to pattern "up with Lt down with Rt" and safe sequencing of SPC, pt demonstrated good sequencing on second bout. Educated on reverse step up technique with RW to ascend single step up.  Wheelchair Mobility    Modified Rankin (Stroke Patients Only)       Balance Overall balance assessment: Needs assistance Sitting-balance support: Feet supported Sitting balance-Leahy Scale: Good     Standing balance support: During functional activity;Bilateral upper extremity supported Standing balance-Leahy Scale: Fair                Pertinent Vitals/Pain Pain Assessment: No/denies pain    Home Living Family/patient expects to be discharged to:: Private residence Living Arrangements: Children Available Help at Discharge: Family;Available 24 hours/day Type of Home: House Home Access: Stairs to enter Entrance Stairs-Rails: Chemical engineer of Steps: 3+1 Home Layout: One level Home Equipment: Shower seat;Bedside commode;Walker - 2 wheels;Cane - single point      Prior Function Level of  Independence: Independent with assistive device(s)          Comments: pt uses SPC     Hand Dominance   Dominant Hand: Right    Extremity/Trunk Assessment   Upper Extremity Assessment Upper Extremity Assessment: Overall WFL for tasks assessed    Lower Extremity Assessment Lower Extremity Assessment: RLE deficits/detail RLE Deficits / Details: good quad activation, slight extensor lag, knee immobilizer used for mobility as she had poor quad control in standing RLE: Unable to fully assess due to immobilization RLE Sensation: WNL RLE Coordination: WNL    Cervical / Trunk Assessment Cervical / Trunk Assessment: Normal  Communication   Communication: No difficulties  Cognition Arousal/Alertness: Awake/alert Behavior During Therapy: WFL for tasks assessed/performed Overall Cognitive Status: Within Functional Limits for tasks assessed           General Comments      Exercises Total Joint Exercises Ankle Circles/Pumps: AROM;Both;Supine;10 reps Quad Sets: AROM;5 reps;Supine;Right Short Arc Quad: AROM;5 reps;Supine;Right Heel Slides: AAROM;AROM;5 reps;Supine;Right Hip ABduction/ADduction: AROM;5 reps;Supine;Right Straight Leg Raises: AROM;5 reps;Supine;Right Long [employer]: AROM;5 reps;Seated;Right Knee Flexion: AROM;AAROM;5 reps;Seated;Right   Assessment/Plan    PT Assessment Patient needs continued PT services  PT Problem List Decreased activity tolerance;Decreased range of motion;Decreased strength;Decreased balance;Decreased mobility;Decreased coordination;Decreased knowledge of use of DME       PT Treatment Interventions DME instruction;Functional mobility training;Patient/family education;Balance training;Gait training;Therapeutic activities;Therapeutic exercise;Stair training    PT Goals (Current goals can be found in the Care Plan section)  Acute Rehab PT Goals Patient Stated Goal: to walk without knee hurting PT Goal Formulation: With patient Time For Goal Achievement: 04/29/19 Potential to Achieve Goals:  Good    Frequency 7X/week    AM-PAC PT "6 Clicks" Mobility  Outcome Measure Help needed turning from your back to your side while in a flat bed without using bedrails?: A Little Help needed moving from lying on your back to sitting on the side of a flat bed without using bedrails?: A Little Help needed moving to and from a bed to a chair (including a wheelchair)?: A Little Help needed standing up from a chair using your arms (e.g., wheelchair or bedside chair)?: A Little Help needed to walk in hospital room?: A Little Help needed climbing 3-5 steps with a railing? : A Little 6 Click Score: 18    End of Session Equipment Utilized During Treatment: Gait belt Activity Tolerance: Patient tolerated treatment well Patient left: with call bell/phone within reach;in bed Nurse Communication: Mobility status PT Visit Diagnosis: Muscle weakness (generalized) (M62.81);Difficulty in walking, not elsewhere classified (R26.2)    Time: 3893-7342 PT Time Calculation (min) (ACUTE ONLY): 54 min   Charges:   PT Evaluation $PT Eval Low Complexity: 1 Low PT Treatments $Gait Training: 8-22 mins $Therapeutic Exercise: 8-22 mins $Therapeutic Activity: 8-22 mins       Verner Mould, DPT Physical Therapist with [facility] [phone]  04/22/2019 7:11 PM

## 2019-04-22 NOTE — Anesthesia Postprocedure Evaluation (Signed)
Anesthesia Post Note  Patient: Kari Coleman  Procedure(s) Performed: RIGHT TOTAL KNEE ARTHROPLASTY (Right Knee)     Patient location during evaluation: PACU Anesthesia Type: Spinal Level of consciousness: awake and alert Pain management: pain level controlled Vital Signs Assessment: post-procedure vital signs reviewed and stable Respiratory status: spontaneous breathing and respiratory function stable Cardiovascular status: blood pressure returned to baseline and stable Postop Assessment: spinal receding Anesthetic complications: no    Last Vitals:  Vitals:   04/22/19 1658 04/22/19 1700  BP: 137/85 137/85  Pulse:  (!) 54  Resp: 16 18  Temp:    SpO2:  98%    Last Pain:  Vitals:   04/22/19 1700  TempSrc:   PainSc: 0-No pain                 Nolon Nations

## 2019-04-22 NOTE — Anesthesia Procedure Notes (Signed)
Spinal  Patient location during procedure: OR Staffing Anesthesiologist: Nolon Nations, MD Preanesthetic Checklist Completed: patient identified, IV checked, site marked, risks and benefits discussed, surgical consent, monitors and equipment checked, pre-op evaluation and timeout performed Spinal Block Patient position: sitting Prep: DuraPrep Patient monitoring: heart rate, cardiac monitor, continuous pulse ox and blood pressure Approach: right paramedian Location: L3-4 Injection technique: single-shot Needle Needle type: Sprotte  Needle gauge: 24 G Needle length: 9 cm Assessment Sensory level: T4

## 2019-04-22 NOTE — Anesthesia Preprocedure Evaluation (Addendum)
Anesthesia Evaluation  Patient identified by MRN, date of birth, ID band Patient awake    Reviewed: Allergy & Precautions, NPO status , Patient's Chart, lab work & pertinent test results  History of Anesthesia Complications (+) PONV and history of anesthetic complications  Airway Mallampati: II  TM Distance: >3 FB Neck ROM: Full    Dental  (+) Dental Advisory Given, Missing   Pulmonary neg pulmonary ROS, former smoker,    Pulmonary exam normal breath sounds clear to auscultation       Cardiovascular hypertension, Pt. on home beta blockers and Pt. on medications Normal cardiovascular exam Rhythm:Regular Rate:Normal     Neuro/Psych negative neurological ROS  negative psych ROS   GI/Hepatic Neg liver ROS, GERD  ,  Endo/Other  Hypothyroidism Hyperthyroidism   Renal/GU negative Renal ROS     Musculoskeletal  (+) Arthritis ,   Abdominal   Peds negative pediatric ROS (+)  Hematology negative hematology ROS (+)   Anesthesia Other Findings   Reproductive/Obstetrics negative OB ROS                           Anesthesia Physical Anesthesia Plan  ASA: III  Anesthesia Plan: Spinal   Post-op Pain Management:  Regional for Post-op pain   Induction: Intravenous  PONV Risk Score and Plan: 4 or greater and Ondansetron, Dexamethasone, Propofol infusion and Treatment may vary due to age or medical condition  Airway Management Planned:   Additional Equipment:   Intra-op Plan:   Post-operative Plan:   Informed Consent: I have reviewed the patients History and Physical, chart, labs and discussed the procedure including the risks, benefits and alternatives for the proposed anesthesia with the patient or authorized representative who has indicated his/her understanding and acceptance.     Dental advisory given  Plan Discussed with: CRNA  Anesthesia Plan Comments:        Anesthesia Quick  Evaluation

## 2019-04-22 NOTE — Anesthesia Procedure Notes (Signed)
Anesthesia Regional Block: Adductor canal block   Pre-Anesthetic Checklist: ,, timeout performed, Correct Patient, Correct Site, Correct Laterality, Correct Procedure, Correct Position, site marked, Risks and benefits discussed,  Surgical consent,  Pre-op evaluation,  At surgeon's request and post-op pain management  Laterality: Right  Prep: chloraprep       Needles:  Injection technique: Single-shot  Needle Type: Stimiplex     Needle Length: 9cm  Needle Gauge: 21     Additional Needles:   Procedures:,,,, ultrasound used (permanent image in chart),,,,  Narrative:  Start time: 04/22/2019 9:10 AM End time: 04/22/2019 9:15 AM Injection made incrementally with aspirations every 5 mL.  Performed by: Personally  Anesthesiologist: Nolon Nations, MD  Additional Notes: BP cuff, EKG monitors applied. Sedation begun. Artery and nerve location verified with U/S and anesthetic injected incrementally, slowly, and after negative aspirations under direct u/s guidance. Good fascial /perineural spread. Tolerated well.

## 2019-04-22 NOTE — Interval H&P Note (Signed)
History and Physical Interval Note:  04/22/2019 9:09 AM  Kari Coleman  has presented today for surgery, with the diagnosis of RIGHT KNEE DEGENERATIVE JOINT DISEASE.  The various methods of treatment have been discussed with the patient and family. After consideration of risks, benefits and other options for treatment, the patient has consented to  Procedure(s) with comments: RIGHT TOTAL KNEE ARTHROPLASTY (Right) - PENDING PRE-OP FOR SDDC APPROVAL as a surgical intervention.  The patient's history has been reviewed, patient examined, no change in status, stable for surgery.  I have reviewed the patient's chart and labs.  Questions were answered to the patient's satisfaction.     Hessie Dibble

## 2019-04-23 ENCOUNTER — Encounter: Payer: Self-pay | Admitting: *Deleted

## 2019-04-23 DIAGNOSIS — Z471 Aftercare following joint replacement surgery: Secondary | ICD-10-CM | POA: Diagnosis not present

## 2019-04-23 DIAGNOSIS — Z96651 Presence of right artificial knee joint: Secondary | ICD-10-CM | POA: Diagnosis not present

## 2019-04-23 DIAGNOSIS — Z87891 Personal history of nicotine dependence: Secondary | ICD-10-CM | POA: Diagnosis not present

## 2019-04-23 DIAGNOSIS — E039 Hypothyroidism, unspecified: Secondary | ICD-10-CM | POA: Diagnosis not present

## 2019-04-23 DIAGNOSIS — M1711 Unilateral primary osteoarthritis, right knee: Secondary | ICD-10-CM | POA: Diagnosis not present

## 2019-04-23 DIAGNOSIS — Z9181 History of falling: Secondary | ICD-10-CM | POA: Diagnosis not present

## 2019-04-23 DIAGNOSIS — I10 Essential (primary) hypertension: Secondary | ICD-10-CM | POA: Diagnosis not present

## 2019-04-23 DIAGNOSIS — Z7982 Long term (current) use of aspirin: Secondary | ICD-10-CM | POA: Diagnosis not present

## 2019-04-23 DIAGNOSIS — H409 Unspecified glaucoma: Secondary | ICD-10-CM | POA: Diagnosis not present

## 2019-04-23 DIAGNOSIS — M1712 Unilateral primary osteoarthritis, left knee: Secondary | ICD-10-CM | POA: Diagnosis not present

## 2019-04-25 DIAGNOSIS — Z471 Aftercare following joint replacement surgery: Secondary | ICD-10-CM | POA: Diagnosis not present

## 2019-04-25 DIAGNOSIS — I10 Essential (primary) hypertension: Secondary | ICD-10-CM | POA: Diagnosis not present

## 2019-04-25 DIAGNOSIS — Z87891 Personal history of nicotine dependence: Secondary | ICD-10-CM | POA: Diagnosis not present

## 2019-04-25 DIAGNOSIS — Z9181 History of falling: Secondary | ICD-10-CM | POA: Diagnosis not present

## 2019-04-25 DIAGNOSIS — Z96651 Presence of right artificial knee joint: Secondary | ICD-10-CM | POA: Diagnosis not present

## 2019-04-25 DIAGNOSIS — H409 Unspecified glaucoma: Secondary | ICD-10-CM | POA: Diagnosis not present

## 2019-04-25 DIAGNOSIS — E039 Hypothyroidism, unspecified: Secondary | ICD-10-CM | POA: Diagnosis not present

## 2019-04-25 DIAGNOSIS — Z7982 Long term (current) use of aspirin: Secondary | ICD-10-CM | POA: Diagnosis not present

## 2019-04-25 DIAGNOSIS — M1712 Unilateral primary osteoarthritis, left knee: Secondary | ICD-10-CM | POA: Diagnosis not present

## 2019-04-28 DIAGNOSIS — Z96651 Presence of right artificial knee joint: Secondary | ICD-10-CM | POA: Diagnosis not present

## 2019-04-28 DIAGNOSIS — Z9181 History of falling: Secondary | ICD-10-CM | POA: Diagnosis not present

## 2019-04-28 DIAGNOSIS — H409 Unspecified glaucoma: Secondary | ICD-10-CM | POA: Diagnosis not present

## 2019-04-28 DIAGNOSIS — I10 Essential (primary) hypertension: Secondary | ICD-10-CM | POA: Diagnosis not present

## 2019-04-28 DIAGNOSIS — E039 Hypothyroidism, unspecified: Secondary | ICD-10-CM | POA: Diagnosis not present

## 2019-04-28 DIAGNOSIS — Z7982 Long term (current) use of aspirin: Secondary | ICD-10-CM | POA: Diagnosis not present

## 2019-04-28 DIAGNOSIS — M1712 Unilateral primary osteoarthritis, left knee: Secondary | ICD-10-CM | POA: Diagnosis not present

## 2019-04-28 DIAGNOSIS — Z87891 Personal history of nicotine dependence: Secondary | ICD-10-CM | POA: Diagnosis not present

## 2019-04-28 DIAGNOSIS — Z471 Aftercare following joint replacement surgery: Secondary | ICD-10-CM | POA: Diagnosis not present

## 2019-04-30 DIAGNOSIS — Z87891 Personal history of nicotine dependence: Secondary | ICD-10-CM | POA: Diagnosis not present

## 2019-04-30 DIAGNOSIS — M1712 Unilateral primary osteoarthritis, left knee: Secondary | ICD-10-CM | POA: Diagnosis not present

## 2019-04-30 DIAGNOSIS — Z7982 Long term (current) use of aspirin: Secondary | ICD-10-CM | POA: Diagnosis not present

## 2019-04-30 DIAGNOSIS — H409 Unspecified glaucoma: Secondary | ICD-10-CM | POA: Diagnosis not present

## 2019-04-30 DIAGNOSIS — Z471 Aftercare following joint replacement surgery: Secondary | ICD-10-CM | POA: Diagnosis not present

## 2019-04-30 DIAGNOSIS — I10 Essential (primary) hypertension: Secondary | ICD-10-CM | POA: Diagnosis not present

## 2019-04-30 DIAGNOSIS — Z9181 History of falling: Secondary | ICD-10-CM | POA: Diagnosis not present

## 2019-04-30 DIAGNOSIS — E039 Hypothyroidism, unspecified: Secondary | ICD-10-CM | POA: Diagnosis not present

## 2019-04-30 DIAGNOSIS — Z96651 Presence of right artificial knee joint: Secondary | ICD-10-CM | POA: Diagnosis not present

## 2019-05-02 DIAGNOSIS — M1711 Unilateral primary osteoarthritis, right knee: Secondary | ICD-10-CM | POA: Diagnosis not present

## 2019-05-02 DIAGNOSIS — M25661 Stiffness of right knee, not elsewhere classified: Secondary | ICD-10-CM | POA: Diagnosis not present

## 2019-05-02 DIAGNOSIS — Z96651 Presence of right artificial knee joint: Secondary | ICD-10-CM | POA: Diagnosis not present

## 2019-05-05 DIAGNOSIS — M25661 Stiffness of right knee, not elsewhere classified: Secondary | ICD-10-CM | POA: Diagnosis not present

## 2019-05-05 DIAGNOSIS — Z96651 Presence of right artificial knee joint: Secondary | ICD-10-CM | POA: Diagnosis not present

## 2019-05-07 DIAGNOSIS — M25661 Stiffness of right knee, not elsewhere classified: Secondary | ICD-10-CM | POA: Diagnosis not present

## 2019-05-07 DIAGNOSIS — Z96651 Presence of right artificial knee joint: Secondary | ICD-10-CM | POA: Diagnosis not present

## 2019-05-12 DIAGNOSIS — M25661 Stiffness of right knee, not elsewhere classified: Secondary | ICD-10-CM | POA: Diagnosis not present

## 2019-05-12 DIAGNOSIS — Z96651 Presence of right artificial knee joint: Secondary | ICD-10-CM | POA: Diagnosis not present

## 2019-05-27 DIAGNOSIS — Z96651 Presence of right artificial knee joint: Secondary | ICD-10-CM | POA: Diagnosis not present

## 2019-05-27 DIAGNOSIS — M25661 Stiffness of right knee, not elsewhere classified: Secondary | ICD-10-CM | POA: Diagnosis not present

## 2019-05-29 DIAGNOSIS — Z96651 Presence of right artificial knee joint: Secondary | ICD-10-CM | POA: Diagnosis not present

## 2019-05-29 DIAGNOSIS — M25661 Stiffness of right knee, not elsewhere classified: Secondary | ICD-10-CM | POA: Diagnosis not present

## 2019-05-30 ENCOUNTER — Other Ambulatory Visit: Payer: Self-pay

## 2019-05-30 ENCOUNTER — Other Ambulatory Visit (HOSPITAL_COMMUNITY): Payer: Self-pay | Admitting: General Practice

## 2019-05-30 ENCOUNTER — Ambulatory Visit (HOSPITAL_COMMUNITY)
Admission: RE | Admit: 2019-05-30 | Discharge: 2019-05-30 | Disposition: A | Discharge status: Home / Self Care | Payer: Medicare HMO | Source: Ambulatory Visit | Attending: Orthopedic Surgery | Admitting: Orthopedic Surgery

## 2019-05-30 DIAGNOSIS — M7989 Other specified soft tissue disorders: Secondary | ICD-10-CM

## 2019-05-30 DIAGNOSIS — M79604 Pain in right leg: Secondary | ICD-10-CM

## 2019-05-30 NOTE — Progress Notes (Signed)
Lower extremity venous has been completed.   Preliminary results in CV Proc.   Abram Sander 05/30/2019 1:28 PM

## 2019-06-01 ENCOUNTER — Other Ambulatory Visit: Payer: Self-pay | Admitting: Endocrinology

## 2019-06-02 ENCOUNTER — Other Ambulatory Visit: Payer: Self-pay

## 2019-06-02 DIAGNOSIS — E890 Postprocedural hypothyroidism: Secondary | ICD-10-CM

## 2019-06-02 MED ORDER — LEVOTHYROXINE SODIUM 50 MCG PO TABS: 50.0000 ug | ORAL_TABLET | Freq: Every day | ORAL | 0 refills | Status: DC

## 2019-06-05 DIAGNOSIS — Z96651 Presence of right artificial knee joint: Secondary | ICD-10-CM | POA: Diagnosis not present

## 2019-06-05 DIAGNOSIS — M25661 Stiffness of right knee, not elsewhere classified: Secondary | ICD-10-CM | POA: Diagnosis not present

## 2019-06-17 ENCOUNTER — Telehealth: Payer: Self-pay | Admitting: Physician Assistant

## 2019-06-17 NOTE — Telephone Encounter (Signed)
Hazel is calling on behalf of the patient stating the patient would like for her presrcriptions to go to [facility] so she isnt having to pay for them. Brayton Layman states that if any questions or need verification to reach out to her or the patient.

## 2019-06-18 DIAGNOSIS — Z96651 Presence of right artificial knee joint: Secondary | ICD-10-CM | POA: Diagnosis not present

## 2019-06-18 DIAGNOSIS — M25661 Stiffness of right knee, not elsewhere classified: Secondary | ICD-10-CM | POA: Diagnosis not present

## 2019-06-18 NOTE — Telephone Encounter (Signed)
Pt pharmacy changed to [place].

## 2019-07-09 ENCOUNTER — Other Ambulatory Visit: Payer: Self-pay | Admitting: Endocrinology

## 2019-07-09 DIAGNOSIS — Z1231 Encounter for screening mammogram for malignant neoplasm of breast: Secondary | ICD-10-CM

## 2019-07-10 ENCOUNTER — Other Ambulatory Visit: Payer: Self-pay

## 2019-07-10 DIAGNOSIS — M25661 Stiffness of right knee, not elsewhere classified: Secondary | ICD-10-CM | POA: Diagnosis not present

## 2019-07-10 DIAGNOSIS — Z96651 Presence of right artificial knee joint: Secondary | ICD-10-CM | POA: Diagnosis not present

## 2019-07-11 ENCOUNTER — Ambulatory Visit: Payer: Medicare HMO | Admitting: Endocrinology

## 2019-07-11 ENCOUNTER — Encounter: Payer: Self-pay | Admitting: Endocrinology

## 2019-07-11 VITALS — BP 124/84 | HR 75 | Ht 67.0 in | Wt 212.0 lb

## 2019-07-11 DIAGNOSIS — E890 Postprocedural hypothyroidism: Secondary | ICD-10-CM

## 2019-07-11 LAB — TSH: TSH: 8.05 u[IU]/mL — ABNORMAL HIGH (ref 0.35–4.50)

## 2019-07-11 LAB — T4, FREE: Free T4: 0.58 ng/dL — ABNORMAL LOW (ref 0.60–1.60)

## 2019-07-11 MED ORDER — LEVOTHYROXINE SODIUM 88 MCG PO TABS: 88.0000 ug | ORAL_TABLET | Freq: Every day | ORAL | 1 refills | Status: DC

## 2019-07-11 NOTE — Progress Notes (Signed)
   Subjective:    Patient ID: Kari Coleman, female    DOB: September 06, 1950, 69 y.o.   MRN: [id number]  HPI Pt returns for f/u of post-RAI hypothyroidism (dx'ed 2013; she was initially rx'ed tapazole, but she later chose RAI rx; [place] (2015) showed MNG; NM scan (2016) showed MNG with normal uptake (24%); Cytology (2016) showed BETH[place]DA CATEGORY 2 and 3; she took RAI rx 4/20; she took tapazole x a few more mos, but she stopped in early 7/20; she started synthroid later in 2020).  pt states she feels well in general.  She says she never misses synthroid.     Review of Systems she denies palpitations and tremor    Objective:   Physical Exam VITAL SIGNS:  See vs page GENERAL: no distress NECK: There is no palpable thyroid enlargement.  No thyroid nodule is palpable.  No palpable lymphadenopathy at the anterior neck.     Lab Results  Component Value Date   TSH 8.05 (H) 07/11/2019       Assessment & Plan:  Hypothyroidism: worse. I have sent a prescription to your pharmacy, to increase synthroid.  Patient Instructions  Blood tests are requested for you today.  We'll let you know about the results.   It is best to never miss the medication.  However, if you do miss it, next best is to double up the next time.   Your require less than a full daily replacement amount of thyroid hormone.  This means that the thyroid was not completely destroyed by the radioactive iodine.  Therefore you have some risk that the overactivity could come back.  this may take many years to happen.  If this happens, you would first notice that your medication requirement would decrease to none.  Then the overactivity would come back.  We'll follow your blood tests for this.   Please come back for a follow-up appointment in 3 months.

## 2019-07-11 NOTE — Patient Instructions (Addendum)
Blood tests are requested for you today.  We'll let you know about the results.   It is best to never miss the medication.  However, if you do miss it, next best is to double up the next time.   Your require less than a full daily replacement amount of thyroid hormone.  This means that the thyroid was not completely destroyed by the radioactive iodine.  Therefore you have some risk that the overactivity could come back.  this may take many years to happen.  If this happens, you would first notice that your medication requirement would decrease to none.  Then the overactivity would come back.  We'll follow your blood tests for this.   Please come back for a follow-up appointment in 3 months.   

## 2019-07-14 ENCOUNTER — Ambulatory Visit: Payer: Medicare HMO | Admitting: Endocrinology

## 2019-07-16 DIAGNOSIS — M25661 Stiffness of right knee, not elsewhere classified: Secondary | ICD-10-CM | POA: Diagnosis not present

## 2019-07-16 DIAGNOSIS — Z96651 Presence of right artificial knee joint: Secondary | ICD-10-CM | POA: Diagnosis not present

## 2019-07-17 DIAGNOSIS — H0015 Chalazion left lower eyelid: Secondary | ICD-10-CM | POA: Diagnosis not present

## 2019-07-17 DIAGNOSIS — H401131 Primary open-angle glaucoma, bilateral, mild stage: Secondary | ICD-10-CM | POA: Diagnosis not present

## 2019-07-24 DIAGNOSIS — H00015 Hordeolum externum left lower eyelid: Secondary | ICD-10-CM | POA: Diagnosis not present

## 2019-07-30 ENCOUNTER — Ambulatory Visit: Payer: Medicare HMO

## 2019-07-31 ENCOUNTER — Encounter (HOSPITAL_BASED_OUTPATIENT_CLINIC_OR_DEPARTMENT_OTHER): Payer: Self-pay | Admitting: *Deleted

## 2019-07-31 ENCOUNTER — Telehealth: Payer: Self-pay | Admitting: Physician Assistant

## 2019-07-31 ENCOUNTER — Other Ambulatory Visit: Payer: Self-pay

## 2019-07-31 ENCOUNTER — Emergency Department (HOSPITAL_BASED_OUTPATIENT_CLINIC_OR_DEPARTMENT_OTHER)
Admission: EM | Admit: 2019-07-31 | Discharge: 2019-07-31 | Disposition: A | Discharge status: Home / Self Care | Payer: Medicare HMO | Attending: Emergency Medicine | Admitting: Emergency Medicine

## 2019-07-31 DIAGNOSIS — Z7982 Long term (current) use of aspirin: Secondary | ICD-10-CM | POA: Diagnosis not present

## 2019-07-31 DIAGNOSIS — I10 Essential (primary) hypertension: Secondary | ICD-10-CM | POA: Diagnosis not present

## 2019-07-31 DIAGNOSIS — Z87891 Personal history of nicotine dependence: Secondary | ICD-10-CM | POA: Insufficient documentation

## 2019-07-31 DIAGNOSIS — Z8541 Personal history of malignant neoplasm of cervix uteri: Secondary | ICD-10-CM | POA: Insufficient documentation

## 2019-07-31 DIAGNOSIS — Z96651 Presence of right artificial knee joint: Secondary | ICD-10-CM | POA: Diagnosis not present

## 2019-07-31 DIAGNOSIS — E039 Hypothyroidism, unspecified: Secondary | ICD-10-CM | POA: Diagnosis not present

## 2019-07-31 DIAGNOSIS — Z79899 Other long term (current) drug therapy: Secondary | ICD-10-CM | POA: Insufficient documentation

## 2019-07-31 MED ORDER — LISINOPRIL 10 MG PO TABS
10.0000 mg | ORAL_TABLET | Freq: Every day | ORAL | 0 refills | Status: DC
Start: 2019-07-31 — End: 2019-09-03

## 2019-07-31 NOTE — Telephone Encounter (Signed)
Patient's triage note. Pt went to ED on 07/31/2019
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
## 2019-07-31 NOTE — Telephone Encounter (Signed)
Nurse Assessment Nurse: Joline Salt, RN, Malachy Mood Date/Time Eilene Ghazi Time): 07/31/2019 1:06:04 PM Confirm and document reason for call. If symptomatic, describe symptoms. ---Caller states her blood pressure is high. The last reading was 197/102. She was put on a new pill Levothyroxine in beginning of April 2021 and her BP has been higher since she started it. Has the patient had close contact with a person known or suspected to have the novel coronavirus illness OR traveled / lives in area with major community spread (including international travel) in the last 14 days from the onset of symptoms? * If Asymptomatic, screen for exposure and travel within the last 14 days. ---No Does the patient have any new or worsening symptoms? ---Yes Will a triage be completed? ---Yes Related visit to physician within the last 2 weeks? ---Yes Does the PT have any chronic conditions? (i.e. diabetes, asthma, this includes High risk factors for pregnancy, etc.) ---Yes List chronic conditions. ---hypothyroid, recent Knee surgery, hypertension Is this a behavioral health or substance abuse call? ---No Guidelines Guideline Title Affirmed Question Affirmed Notes Nurse Date/Time (Eastern Time) Blood Pressure - High [id number] Systolic BP >= 0000000 OR Diastolic >= [id number] AND [id number] cardiac or neurologic symptoms Catha Brow 07/31/2019 1:10:34 PMPLEASE NOTE: All timestamps contained within this report are represented as [place] Standard Time. CONFIDENTIALTY NOTICE: This fax transmission is intended only for the addressee. It contains information that is legally privileged, confidential or otherwise protected from use or disclosure. If you are not the intended recipient, you are strictly prohibited from reviewing, disclosing, copying using or disseminating any of this information or taking any action in reliance on or regarding this information. If you have received this fax in error, please notify us immediately by telephone  so that we can arrange for its return to [place]. Phone: [phone], Toll-Free: [phone], Fax: [phone] Page: 2 of 2 Call Id: [id number] Guidelines Guideline Title Affirmed Question Affirmed Notes Nurse Date/Time Eilene Ghazi Time) (e.g., chest pain, difficulty breathing, unsteady gait, blurred vision) Disp. Time Eilene Ghazi Time) Disposition Final User 07/31/2019 1:15:49 PM Go to ED Now Yes Joline Salt, RN, Erskine Speed Disagree/Comply Comply Caller Understands Yes PreDisposition Did not know what to do Care Advice Given Per Guideline NOTE TO TRIAGER - DRIVING: * Another adult should drive. GO TO ED NOW: * You need to be seen in the Emergency Department. * Go to the ED at ___________ [place] now. Drive carefully.

## 2019-07-31 NOTE — ED Notes (Signed)
ED Provider at bedside. 

## 2019-07-31 NOTE — ED Triage Notes (Signed)
States she has been taking her BP at home and it has been elevated. She contributes change in her BP to new Thyroid medication she was started on.

## 2019-08-01 NOTE — Telephone Encounter (Signed)
Noted  

## 2019-08-08 ENCOUNTER — Ambulatory Visit: Payer: Medicare HMO | Admitting: Physician Assistant

## 2019-08-26 DIAGNOSIS — H0015 Chalazion left lower eyelid: Secondary | ICD-10-CM | POA: Diagnosis not present

## 2019-08-29 ENCOUNTER — Telehealth: Payer: Self-pay | Admitting: Physician Assistant

## 2019-08-29 DIAGNOSIS — N3946 Mixed incontinence: Secondary | ICD-10-CM | POA: Diagnosis not present

## 2019-08-29 DIAGNOSIS — R310 Gross hematuria: Secondary | ICD-10-CM | POA: Diagnosis not present

## 2019-08-29 DIAGNOSIS — N3000 Acute cystitis without hematuria: Secondary | ICD-10-CM | POA: Diagnosis not present

## 2019-08-29 NOTE — Telephone Encounter (Signed)
Chief Complaint Blood Pressure High Reason for Call Symptomatic / Request for Health Information Initial Comment Caller states the patient has high blood pressure of 150/100, not sure exactly the top number. Translation No Nurse Assessment Guidelines Guideline Title Affirmed Question Affirmed Notes Nurse Date/Time (Eastern Time) Disp. Time Eilene Ghazi Time) Disposition Final User 08/29/2019 12:07:12 PM Attempt made - no message left Theodoro Grist 08/29/2019 12:23:33 PM Attempt made - no message left Theodoro Grist 08/29/2019 12:35:40 PM FINAL ATTEMPT MADE - no message left Yes Gareth Eagle, RN, Raquel Sarna Comments User: Tilden Fossa, RN Date/Time Eilene Ghazi Time): 08/29/2019 12:07:23 PM Voicemail box ful

## 2019-08-29 NOTE — Telephone Encounter (Signed)
Dizziness worsened day before yesterday. 167/97 was her bp reading today when she seen her urologist.  Patient was seen at [facility] urgent care last week for high blood pressure.   Please schedule with [facility] for blood pressure follow up.

## 2019-08-29 NOTE — Telephone Encounter (Signed)
Patients phone went straight to voicemail, but the voicemail is full.

## 2019-09-02 ENCOUNTER — Other Ambulatory Visit: Payer: Self-pay

## 2019-09-02 NOTE — Telephone Encounter (Signed)
Patient is scheduled for 3pm tomorrow.

## 2019-09-02 NOTE — Telephone Encounter (Signed)
Please call pt and schedule appt for follow up on blood pressure.

## 2019-09-03 ENCOUNTER — Encounter: Payer: Self-pay | Admitting: Physician Assistant

## 2019-09-03 ENCOUNTER — Ambulatory Visit (INDEPENDENT_AMBULATORY_CARE_PROVIDER_SITE_OTHER): Payer: Medicare HMO | Admitting: Physician Assistant

## 2019-09-03 VITALS — BP 134/90 | HR 68 | Temp 98.0°F | Ht 67.0 in | Wt 216.0 lb

## 2019-09-03 DIAGNOSIS — I10 Essential (primary) hypertension: Secondary | ICD-10-CM

## 2019-09-03 NOTE — Progress Notes (Signed)
Kari Coleman is a 69 y.o. female is here for follow up.  I acted as a Education administrator for [employer], PA-C [name], LPN   History of Present Illness:   Chief Complaint  Kari Coleman presents with  . Hypertension    HPI   Hypertension Currently taking Norvasc 10 mg, Lisinopril-HCTZ 20-25 mg, metoprolol 100 mg daily.  Kari Coleman was briefly put on an additional Lisinopril 10 mg daily but Kari Coleman didn't take for very long -- did not feel like this was helpful. Kari Coleman is checking blood pressure everyday averaging systolic 99991111, diastolic [id number]. Kari Coleman denies headaches, blurred vision, chest pain, SOB or lower leg edema. Denies excessive caffeine intake, stimulant usage, excessive alcohol intake or increase in salt consumption.  BP Readings from Last 3 Encounters:  09/03/19 134/90  07/31/19 (!) 158/85  07/11/19 124/84   Of note, Kari Coleman has had uncontrolled TSH and was told to start updated levothyroxine 88 mcg however Kari Coleman did not start this yet.    Health Maintenance Due  Topic Date Due  . COLONOSCOPY  Never done  . PNA vac Low Risk Adult (1 of 2 - PCV13) Never done  . MAMMOGRAM  07/04/2019    Past Medical History:  Diagnosis Date  . Arthritis   . Cataract   . Cervical cancer ([place])   . GERD (gastroesophageal reflux disease)   . Glaucoma 2017  . History of echocardiogram    Echo 9/16:  Mild LVH, EF 55-60%, no RWMA, Gr 1 DD, mild MR // Echo 1/17:  Mild LVH, EF 50-55%, Gr 1 DD, mild to mod MR, mild LAE  . Hypertension   . Hyperthyroidism    s/p RAI treatment  . Palpitations   . PONV (postoperative nausea and vomiting)    years ago  . Woke up and saw asian people   . 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 Tobacco abuse   . Tuberculosis    positive test as a caregiver cxr annually   . Urinary incontinence      Social History   Tobacco Use  . Smoking status: Former Smoker    Packs/day: 0.50    Years: 48.00    Pack years: 24.00    Types: Cigarettes    Start date:  11/30/1968    Quit date: 10/31/2016    Years since quitting: 2.8  . Smokeless tobacco: Never Used  . Tobacco comment: did  not smoke more than .5 pack   Substance Use Topics  . Alcohol use: Not Currently    Alcohol/week: 0.0 standard drinks    Comment: rarely   once a year  . Drug use: No    Past Surgical History:  Procedure Laterality Date  . ABDOMINAL HYSTERECTOMY    . bladder surgey for incontinence    . EYE SURGERY    . REPAIR OF COMPLEX TRACTION RETINAL DETACHMENT    . thyroid radiation    . TOTAL ABDOMINAL HYSTERECTOMY W/ BILATERAL SALPINGOOPHORECTOMY    . TOTAL KNEE ARTHROPLASTY Right 04/22/2019   Procedure: RIGHT TOTAL KNEE ARTHROPLASTY;  Surgeon: Melrose Nakayama, MD;  Location: [facility];  Service: Orthopedics;  Laterality: Right;  PENDING PRE-OP FOR SDDC APPROVAL    Family History  Problem Relation Age of Onset  . Hypertension Mother   . Heart failure Mother   . Diabetes Mother   . Pancreatic cancer Father   . Colon cancer Maternal Aunt   . Cancer Sister   . Thyroid disease Paternal  Aunt   . Cancer Paternal Aunt   . Thyroid disease Other        paternal side  . Cancer Maternal Grandfather   . Diabetes Paternal Grandmother   . Diabetes Maternal Uncle     PMHx, SurgHx, SocialHx, FamHx, Medications, and Allergies were reviewed in the Visit Navigator and updated as appropriate.   Kari Coleman Active Problem List   Diagnosis Date Noted  . Primary osteoarthritis of right knee 04/22/2019  . Abnormal EKG 04/14/2019  . Hypothyroidism 04/11/2019  . Elevated glucose 04/08/2019  . Chest discomfort 04/25/2018  . Ex-smoker 04/25/2018  . Primary localized osteoarthritis of right knee 01/04/2016  . Hypertension     Social History   Tobacco Use  . Smoking status: Former Smoker    Packs/day: 0.50    Years: 48.00    Pack years: 24.00    Types: Cigarettes    Start date: 11/30/1968    Quit date: 10/31/2016    Years since quitting: 2.8  . Smokeless tobacco: Never Used  .  Tobacco comment: did  not smoke more than .5 pack   Substance Use Topics  . Alcohol use: Not Currently    Alcohol/week: 0.0 standard drinks    Comment: rarely   once a year  . Drug use: No    Current Medications and Allergies:    Current Outpatient Medications:  .  albuterol (VENTOLIN HFA) 108 (90 Base) MCG/ACT inhaler, Inhale 2 puffs into the lungs every 6 (six) hours as needed for wheezing or shortness of breath., Disp: 18 g, Rfl: 2 .  amLODipine (NORVASC) 10 MG tablet, Take 1 tablet (10 mg total) by mouth daily. (Kari Coleman taking differently: Take 10 mg by mouth every evening. ), Disp: 90 tablet, Rfl: 1 .  aspirin EC 81 MG tablet, Take 1 tablet (81 mg total) by mouth 2 (two) times daily after a meal., Disp: 30 tablet, Rfl: 0 .  brimonidine (ALPHAGAN) 0.2 % ophthalmic solution, Place 1 drop into the right eye 2 (two) times daily., Disp: , Rfl:  .  latanoprost (XALATAN) 0.005 % ophthalmic solution, Place 1 drop into both eyes at bedtime. As directed , Disp: , Rfl:  .  levothyroxine (SYNTHROID) 88 MCG tablet, Take 1 tablet (88 mcg total) by mouth daily before breakfast., Disp: 90 tablet, Rfl: 1 .  lisinopril-hydrochlorothiazide (ZESTORETIC) 20-25 MG tablet, Take 1 tablet by mouth once daily (Kari Coleman taking differently: Take 1 tablet by mouth every evening. ), Disp: 90 tablet, Rfl: 0 .  metoprolol succinate (TOPROL-XL) 100 MG 24 hr tablet, Take 1 tablet (100 mg total) by mouth daily. Take with or immediately following a meal. (Kari Coleman taking differently: Take 100 mg by mouth every evening. Take with or immediately following a meal.), Disp: 90 tablet, Rfl: 1 .  pantoprazole (PROTONIX) 40 MG tablet, Take 1 tablet (40 mg total) by mouth daily., Disp: 30 tablet, Rfl: 11 .  sulfamethoxazole-trimethoprim (BACTRIM DS) 800-160 MG tablet, Take 1 tablet by mouth 2 (two) times daily., Disp: , Rfl:    Allergies  Allergen Reactions  . Doxycycline Nausea Only  . Codeine Nausea And Vomiting  . Pollen  Extract     Review of Systems   ROS Negative unless otherwise specified per HPI.  Vitals:   Vitals:   09/03/19 1541  BP: 134/90  Pulse: 68  Temp: 98 F (36.7 C)  TempSrc: Temporal  SpO2: 97%  Weight: 216 lb (98 kg)  Height: 5\' 7"  (1.702 m)     Body mass  index is 33.83 kg/m.   Physical Exam:    Physical Exam Vitals and nursing note reviewed.  Constitutional:      General: Kari Coleman is not in acute distress.    Appearance: Kari Coleman is well-developed. Kari Coleman is not ill-appearing or toxic-appearing.  Cardiovascular:     Rate and Rhythm: Normal rate and regular rhythm.     Pulses: Normal pulses.     Heart sounds: Normal heart sounds, S1 normal and S2 normal.     Comments: No LE edema Pulmonary:     Effort: Pulmonary effort is normal.     Breath sounds: Normal breath sounds.  Skin:    General: Skin is warm and dry.  Neurological:     Mental Status: Kari Coleman is alert.     GCS: GCS eye subscore is 4. GCS verbal subscore is 5. GCS motor subscore is 6.  Psychiatric:        Speech: Speech normal.        Behavior: Behavior normal. Behavior is cooperative.      Assessment and Plan:    Cena was seen today for hypertension.  Diagnoses and all orders for this visit:  Essential hypertension -     Basic metabolic panel   BP relatively controlled today. Due for updated BMP. Will update this today. Reviewed medications with Kari Coleman. Kari Coleman was instructed to: Continue: 1. Lisinopril-HCTZ 20-25 mg 2. Metoprolol 100 mg 3. Norvasc 10 mg  START NEW 88 MCG DOSE of LEVOTHYROXINE -- THIS HAS BEEN SENT IN FOR YOU.  Follow-up with me in TWO WEEKS and BRING YOUR BLOOD PRESSURE MONITOR.  . Reviewed expectations re: course of current medical issues. . Discussed self-management of symptoms. . Outlined signs and symptoms indicating need for more acute intervention. . Kari Coleman verbalized understanding and all questions were answered. . See orders for this visit as documented in the electronic  medical record. . Kari Coleman received an After Visit Summary.  CMA or LPN served as scribe during this visit. History, Physical, and Plan performed by medical provider. The above documentation has been reviewed and is accurate and complete.  I spent 25 minutes with this Kari Coleman, greater than 50% was face-to-face time counseling regarding the above diagnoses.   Inda Coke, PA-C [place] [place] 09/03/2019  Follow-up: No follow-ups on file.

## 2019-09-03 NOTE — Patient Instructions (Signed)
It was great to see you!  Continue: 1. Lisinopril-HCTZ 20-25 mg 2. Metoprolol 100 mg 3. Norvasc 10 mg  START NEW 88 MCG DOSE of LEVOTHYROXINE -- THIS HAS BEEN SENT IN FOR YOU.  Follow-up with me in TWO WEEKS and BRING YOUR BLOOD PRESSURE MONITOR.  Take care,  Inda Coke PA-C

## 2019-09-04 LAB — BASIC METABOLIC PANEL
BUN: 17 mg/dL (ref 6–23)
CO2: 27 mEq/L (ref 19–32)
Calcium: 10.2 mg/dL (ref 8.4–10.5)
Chloride: 103 mEq/L (ref 96–112)
Creatinine, Ser: 1.11 mg/dL (ref 0.40–1.20)
GFR: 58.64 mL/min — ABNORMAL LOW (ref >60.00)
Glucose, Bld: 98 mg/dL (ref 70–99)
Potassium: 4.4 mEq/L (ref 3.5–5.1)
Sodium: 137 mEq/L (ref 135–145)

## 2019-09-15 DIAGNOSIS — R310 Gross hematuria: Secondary | ICD-10-CM | POA: Diagnosis not present

## 2019-09-18 DIAGNOSIS — R1110 Vomiting, unspecified: Secondary | ICD-10-CM | POA: Diagnosis not present

## 2019-09-18 DIAGNOSIS — R110 Nausea: Secondary | ICD-10-CM | POA: Diagnosis not present

## 2019-09-18 DIAGNOSIS — R310 Gross hematuria: Secondary | ICD-10-CM | POA: Diagnosis not present

## 2019-09-26 DIAGNOSIS — N3000 Acute cystitis without hematuria: Secondary | ICD-10-CM | POA: Diagnosis not present

## 2019-09-26 DIAGNOSIS — R310 Gross hematuria: Secondary | ICD-10-CM | POA: Diagnosis not present

## 2019-09-26 DIAGNOSIS — N3946 Mixed incontinence: Secondary | ICD-10-CM | POA: Diagnosis not present

## 2019-10-15 ENCOUNTER — Telehealth: Payer: Self-pay | Admitting: Physician Assistant

## 2019-10-15 ENCOUNTER — Ambulatory Visit: Payer: Medicare HMO | Admitting: Endocrinology

## 2019-10-15 MED ORDER — LISINOPRIL-HYDROCHLOROTHIAZIDE 20-25 MG PO TABS: 1.0000 | ORAL_TABLET | Freq: Every day | ORAL | 0 refills | Status: DC

## 2019-10-15 NOTE — Telephone Encounter (Signed)
MEDICATION: Lisinopril 20-25MG   PHARMACY: [facility] [address]  Comments:   **Let patient know to contact pharmacy at the end of the day to make sure medication is ready. **  ** Please notify patient to allow 48-72 hours to process**  **Encourage patient to contact the pharmacy for refills or they can request refills through [facility]**

## 2019-10-15 NOTE — Telephone Encounter (Signed)
Spoke to pt told her I will send in refill for medication but need to make an appt to follow up this month. Kari Coleman wanted to see you back in two weeks. Pt verbalized understanding and said she will schedule.

## 2019-10-21 ENCOUNTER — Ambulatory Visit: Payer: Medicare Other | Admitting: Endocrinology

## 2019-10-21 ENCOUNTER — Other Ambulatory Visit: Payer: Self-pay

## 2019-10-21 ENCOUNTER — Ambulatory Visit (INDEPENDENT_AMBULATORY_CARE_PROVIDER_SITE_OTHER): Payer: Medicare Other | Admitting: Endocrinology

## 2019-10-21 VITALS — BP 150/90 | HR 82 | Ht 67.01 in | Wt 220.0 lb

## 2019-10-21 DIAGNOSIS — E890 Postprocedural hypothyroidism: Secondary | ICD-10-CM | POA: Diagnosis not present

## 2019-10-21 DIAGNOSIS — R5383 Other fatigue: Secondary | ICD-10-CM | POA: Diagnosis not present

## 2019-10-21 LAB — T4, FREE: Free T4: 0.75 ng/dL (ref 0.60–1.60)

## 2019-10-21 LAB — VITAMIN D 25 HYDROXY (VIT D DEFICIENCY, FRACTURES): VITD: 14.12 ng/mL — ABNORMAL LOW (ref 30.00–100.00)

## 2019-10-21 LAB — TSH: TSH: 5.13 u[IU]/mL — ABNORMAL HIGH (ref 0.35–4.50)

## 2019-10-21 MED ORDER — LEVOTHYROXINE SODIUM 100 MCG PO TABS: 100.0000 ug | ORAL_TABLET | Freq: Every day | ORAL | 3 refills | Status: DC

## 2019-10-21 NOTE — Progress Notes (Signed)
Subjective:    Patient ID: Kari Coleman, female    DOB: Aug 29, 1950, 69 y.o.   MRN: [id number]  HPI Pt returns for f/u of post-RAI hypothyroidism (dx'ed 2013; she was initially rx'ed tapazole, but she later chose RAI rx; [place] (2015) showed MNG; [place] scan (2016) showed MNG with normal uptake (24%); Cytology (2016) showed [place] CATEGORY 2 and 3; she took RAI rx 4/20; she took tapazole x a few more mos, but she stopped in early 7/20; she started synthroid later in 2020).  pt states she feels well in general, except for fatigue.  She says she never misses synthroid. She requests to check Vit-D Past Medical History:  Diagnosis Date   Arthritis    Cataract    Cervical cancer ([place])    GERD (gastroesophageal reflux disease)    Glaucoma 2017   History of echocardiogram    Echo 9/16:  Mild LVH, EF 55-60%, no RWMA, Gr 1 DD, mild MR // Echo 1/17:  Mild LVH, EF 50-55%, Gr 1 DD, mild to mod MR, mild LAE   Hypertension    Hyperthyroidism    s/p RAI treatment   Palpitations    PONV (postoperative nausea and vomiting)    years ago  . Woke up and saw asian people    Retinal detachment    L eye - partial blindness   Tobacco abuse    Tuberculosis    positive test as a caregiver cxr annually    Urinary incontinence     Past Surgical History:  Procedure Laterality Date   ABDOMINAL HYSTERECTOMY     bladder surgey for incontinence     EYE SURGERY     REPAIR OF COMPLEX TRACTION RETINAL DETACHMENT     thyroid radiation     TOTAL ABDOMINAL HYSTERECTOMY W/ BILATERAL SALPINGOOPHORECTOMY     TOTAL KNEE ARTHROPLASTY Right 04/22/2019   Procedure: RIGHT TOTAL KNEE ARTHROPLASTY;  Surgeon: Melrose Nakayama, MD;  Location: [facility];  Service: Orthopedics;  Laterality: Right;  PENDING PRE-OP FOR SDDC APPROVAL    Social History   Socioeconomic History   Marital status: Single    Spouse name: Not on file   Number of children: 2   Years of education: Not on file   Highest education  level: Not on file  Occupational History   Occupation: CNA    Comment: [place]  Tobacco Use   Smoking status: Former Smoker    Packs/day: 0.50    Years: 48.00    Pack years: 24.00    Types: Cigarettes    Start date: 11/30/1968    Quit date: 10/31/2016    Years since quitting: 2.9   Smokeless tobacco: Never Used   Tobacco comment: did  not smoke more than .5 pack   Vaping Use   Vaping Use: Never used  Substance and Sexual Activity   Alcohol use: Not Currently    Alcohol/week: 0.0 standard drinks    Comment: rarely   once a year   Drug use: No   Sexual activity: Not Currently  Other Topics Concern   Not on file  Social History Narrative   Caregiver   Daughter and grandson in household   Social Determinants of Health   Financial Resource Strain:    Difficulty of Paying Living Expenses:   Food Insecurity:    Worried About Charity fundraiser in the Last Year:    Ran Out of Food in the Last Year:   Transportation Needs:  Lack of Transportation (Medical):    Lack of Transportation (Non-Medical):   Physical Activity:    Days of Exercise per Week:    Minutes of Exercise per Session:   Stress:    Feeling of Stress :   Social Connections:    Frequency of Communication with Friends and Family:    Frequency of Social Gatherings with Friends and Family:    Attends Religious Services:    Active Member of Clubs or Organizations:    Attends Music therapist:    Marital Status:   Intimate Partner Violence:    Fear of Current or Ex-Partner:    Emotionally Abused:    Physically Abused:    Sexually Abused:     Current Outpatient Medications on File Prior to Visit  Medication Sig Dispense Refill   albuterol (VENTOLIN HFA) 108 (90 Base) MCG/ACT inhaler Inhale 2 puffs into the lungs every 6 (six) hours as needed for wheezing or shortness of breath. 18 g 2   amLODipine (NORVASC) 10 MG tablet Take 1 tablet (10 mg total) by mouth  daily. (Patient taking differently: Take 10 mg by mouth every evening. ) 90 tablet 1   aspirin EC 81 MG tablet Take 1 tablet (81 mg total) by mouth 2 (two) times daily after a meal. 30 tablet 0   brimonidine (ALPHAGAN) 0.2 % ophthalmic solution Place 1 drop into the right eye 2 (two) times daily.     latanoprost (XALATAN) 0.005 % ophthalmic solution Place 1 drop into both eyes at bedtime. As directed      lisinopril-hydrochlorothiazide (ZESTORETIC) 20-25 MG tablet Take 1 tablet by mouth daily. 90 tablet 0   metoprolol succinate (TOPROL-XL) 100 MG 24 hr tablet Take 1 tablet (100 mg total) by mouth daily. Take with or immediately following a meal. (Patient taking differently: Take 100 mg by mouth every evening. Take with or immediately following a meal.) 90 tablet 1   pantoprazole (PROTONIX) 40 MG tablet Take 1 tablet (40 mg total) by mouth daily. 30 tablet 11   sulfamethoxazole-trimethoprim (BACTRIM DS) 800-160 MG tablet Take 1 tablet by mouth 2 (two) times daily.     No current facility-administered medications on file prior to visit.    Allergies  Allergen Reactions   Doxycycline Nausea Only   Codeine Nausea And Vomiting   Pollen Extract     Family History  Problem Relation Age of Onset   Hypertension Mother    Heart failure Mother    Diabetes Mother    Pancreatic cancer Father    Colon cancer Maternal Aunt    Cancer Sister    Thyroid disease Paternal Aunt    Cancer Paternal Aunt    Thyroid disease Other        paternal side   Cancer Maternal Grandfather    Diabetes Paternal Grandmother    Diabetes Maternal Uncle     BP (!) 150/90 (BP Location: Left Arm, Patient Position: Sitting)    Pulse 82    Ht 5' 7.01" (1.702 m)    Wt 220 lb (99.8 kg)    SpO2 97%    BMI 34.45 kg/m    Review of Systems she has weight gain.      Objective:   Physical Exam VITAL SIGNS:  See vs page GENERAL: no distress NECK: There is no palpable thyroid enlargement.  No thyroid  nodule is palpable.  No palpable lymphadenopathy at the anterior neck.   Lab Results  Component Value Date   TSH 5.13 (  H) 10/21/2019       Assessment & Plan:  Hypothyroidism: she needs increased rx.     Patient Instructions  Blood tests are requested for you today.  We'll let you know about the results.   It is best to never miss the medication.  However, if you do miss it, next best is to double up the next time.   Your require less than a full daily replacement amount of thyroid hormone.  This means that the thyroid was not completely destroyed by the radioactive iodine.  Therefore you have some risk that the overactivity could come back.  this may take many years to happen.  If this happens, you would first notice that your medication requirement would decrease to none.  Then the overactivity would come back.  We'll follow your blood tests for this.   Please come back for a follow-up appointment in 3 months.

## 2019-10-21 NOTE — Patient Instructions (Addendum)
Blood tests are requested for you today.  We'll let you know about the results.   It is best to never miss the medication.  However, if you do miss it, next best is to double up the next time.   Your require less than a full daily replacement amount of thyroid hormone.  This means that the thyroid was not completely destroyed by the radioactive iodine.  Therefore you have some risk that the overactivity could come back.  this may take many years to happen.  If this happens, you would first notice that your medication requirement would decrease to none.  Then the overactivity would come back.  We'll follow your blood tests for this.   Please come back for a follow-up appointment in 3 months.

## 2019-10-23 ENCOUNTER — Telehealth: Payer: Self-pay | Admitting: Endocrinology

## 2019-10-23 NOTE — Telephone Encounter (Signed)
Returned pt all and gave lab results.

## 2019-10-23 NOTE — Telephone Encounter (Signed)
Patient called re: Patient states she is returning a call for her lab results. Patient requests to be called at ph# [phone].

## 2019-11-10 ENCOUNTER — Other Ambulatory Visit: Payer: Self-pay

## 2019-11-10 ENCOUNTER — Encounter: Payer: Self-pay | Admitting: Physician Assistant

## 2019-11-10 ENCOUNTER — Ambulatory Visit (INDEPENDENT_AMBULATORY_CARE_PROVIDER_SITE_OTHER)
Admission: RE | Admit: 2019-11-10 | Discharge: 2019-11-10 | Disposition: A | Discharge status: Home / Self Care | Payer: Medicare Other | Source: Ambulatory Visit | Attending: Physician Assistant | Admitting: Physician Assistant

## 2019-11-10 ENCOUNTER — Ambulatory Visit (INDEPENDENT_AMBULATORY_CARE_PROVIDER_SITE_OTHER): Payer: Medicare Other | Admitting: Physician Assistant

## 2019-11-10 VITALS — BP 130/70 | HR 65 | Temp 98.7°F | Resp 18 | Ht 67.0 in | Wt 220.8 lb

## 2019-11-10 DIAGNOSIS — R944 Abnormal results of kidney function studies: Secondary | ICD-10-CM

## 2019-11-10 DIAGNOSIS — R0602 Shortness of breath: Secondary | ICD-10-CM | POA: Diagnosis not present

## 2019-11-10 DIAGNOSIS — R7309 Other abnormal glucose: Secondary | ICD-10-CM

## 2019-11-10 DIAGNOSIS — R2681 Unsteadiness on feet: Secondary | ICD-10-CM

## 2019-11-10 DIAGNOSIS — I10 Essential (primary) hypertension: Secondary | ICD-10-CM | POA: Diagnosis not present

## 2019-11-10 DIAGNOSIS — M25512 Pain in left shoulder: Secondary | ICD-10-CM | POA: Diagnosis not present

## 2019-11-10 MED ORDER — SPACER/AERO-HOLDING CHAMBERS DEVI: 1.0000 | Freq: Once | 0 refills | Status: AC

## 2019-11-10 NOTE — Patient Instructions (Signed)
It was great to see you!  We will get labs today and call you with the results.  For your shoulder: let's get an xray  An order for an xray has been put in for you. To get your xray, you can walk in at the [facility] location without a scheduled appointment. The address is [address]. It is across the street from [place] is located in the basement.  Hours of operation are M-F 8:30am to 5:00pm. Please note that they are closed for lunch between 12:30 and 1:00pm.  For your shoulder and feeling imbalanced --> let's get you to physical therapy. You can schedule an appointment with Lauren here at our office on your way out.  I will send in a spacer for you to pick up from the pharmacy, I hope this helps you use the inhaler better.  Please call and follow-up with the cardiologist to make sure you are on the right track -- [phone]   Take care,  Inda Coke PA-C

## 2019-11-10 NOTE — Progress Notes (Signed)
Kari Coleman is a 69 y.o. female is here to discuss:   History of Present Illness:   Chief Complaint  Patient presents with  . Hypertension    Has been checking her blood pressure at home. She still has been getting elevated readings. She has her blood pressure cuff with her.     HPI   HTN Currently taking Norvasc 10 mg daily, Lisinopril-HCT 20-25 mg, Toprol XL 100 mg daily. At home blood pressure readings are: at times as high as 093 systolic and 267 diastolic. Patient denies chest pain, SOB, blurred vision, dizziness, unusual headaches, lower leg swelling. Patient is compliant with medication. Denies excessive caffeine intake, stimulant usage, excessive alcohol intake, or increase in salt consumption.  BP Readings from Last 3 Encounters:  11/10/19 130/70  10/21/19 (!) 150/90  09/03/19 134/90   Her monitor today in the office is -- 150/95  Deconditioning When she is at home she would like to walk but feels like she may fall backward at times. Uses cane at times but tries to avoid to. Denies falls/presyncope/lightheadedness.  L shoulder pain Has intermittent achiness in her L shoulder. Reports significant history of rotator cuff issues. Denies radiation of pain. Has had some nausea at times, but she feels like this is related to her bladder device. Takes tylenol prn. Denies changes in grip strength.  SOB Uses her albuterol inhaler up to 2 times a week to help with SOB. Feels like she is deconditioned, does not fee like its related to her heart -- is not having chest pain or swelling in her lower extremities. She states that she is bringing this up today because her grandson told her that she is using her inhaler incorrectly.  Elevated glucose Tells me that she was supposed to have her HgbA1c checked but she hasn't yet.  Strong family history of diabetes. Denies polyuria/polyphagia.  Decreased GFR Needs updated labs today. Is having some occasional nausea but she thinks that  it is from her bladder device.    Health Maintenance Due  Topic Date Due  . COLONOSCOPY  Never done  . PNA vac Low Risk Adult (1 of 2 - PCV13) Never done  . MAMMOGRAM  07/04/2019  . INFLUENZA VACCINE  11/02/2019    Past Medical History:  Diagnosis Date  . Arthritis   . Cataract   . Cervical cancer ([place])   . GERD (gastroesophageal reflux disease)   . Glaucoma 2017  . History of echocardiogram    Echo 9/16:  Mild LVH, EF 55-60%, no RWMA, Gr 1 DD, mild MR // Echo 1/17:  Mild LVH, EF 50-55%, Gr 1 DD, mild to mod MR, mild LAE  . Hypertension   . Hyperthyroidism    s/p RAI treatment  . Palpitations   . PONV (postoperative nausea and vomiting)    years ago  . Woke up and saw asian people   . 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 Tobacco abuse   . Tuberculosis    positive test as a caregiver cxr annually   . Urinary incontinence      Social History   Tobacco Use  . Smoking status: Former Smoker    Packs/day: 0.50    Years: 48.00    Pack years: 24.00    Types: Cigarettes    Start date: 11/30/1968    Quit date: 10/31/2016    Years since quitting: 3.0  . Smokeless tobacco: Never Used  . Tobacco comment: did  not smoke more than .5 pack   Vaping Use  . Vaping Use: Never used  Substance Use Topics  . Alcohol use: Not Currently    Alcohol/week: 0.0 standard drinks    Comment: rarely   once a year  . Drug use: No    Past Surgical History:  Procedure Laterality Date  . ABDOMINAL HYSTERECTOMY    . bladder surgey for incontinence    . EYE SURGERY    . REPAIR OF COMPLEX TRACTION RETINAL DETACHMENT    . thyroid radiation    . TOTAL ABDOMINAL HYSTERECTOMY W/ BILATERAL SALPINGOOPHORECTOMY    . TOTAL KNEE ARTHROPLASTY Right 04/22/2019   Procedure: RIGHT TOTAL KNEE ARTHROPLASTY;  Surgeon: Melrose Nakayama, MD;  Location: [facility];  Service: Orthopedics;  Laterality: Right;  PENDING PRE-OP FOR SDDC APPROVAL    Family History  Problem Relation Age of Onset  .  Hypertension Mother   . Heart failure Mother   . Diabetes Mother   . Pancreatic cancer Father   . Colon cancer Maternal Aunt   . Cancer Sister   . Thyroid disease Paternal Aunt   . Cancer Paternal Aunt   . Thyroid disease Other        paternal side  . Cancer Maternal Grandfather   . Diabetes Paternal Grandmother   . Diabetes Maternal Uncle     PMHx, SurgHx, SocialHx, FamHx, Medications, and Allergies were reviewed in the Visit Navigator and updated as appropriate.   Patient Active Problem List   Diagnosis Date Noted  . Fatigue 10/21/2019  . Primary osteoarthritis of right knee 04/22/2019  . Abnormal EKG 04/14/2019  . Hypothyroidism 04/11/2019  . Elevated glucose 04/08/2019  . Chest discomfort 04/25/2018  . Ex-smoker 04/25/2018  . Primary localized osteoarthritis of right knee 01/04/2016  . Hypertension     Social History   Tobacco Use  . Smoking status: Former Smoker    Packs/day: 0.50    Years: 48.00    Pack years: 24.00    Types: Cigarettes    Start date: 11/30/1968    Quit date: 10/31/2016    Years since quitting: 3.0  . Smokeless tobacco: Never Used  . Tobacco comment: did  not smoke more than .5 pack   Vaping Use  . Vaping Use: Never used  Substance Use Topics  . Alcohol use: Not Currently    Alcohol/week: 0.0 standard drinks    Comment: rarely   once a year  . Drug use: No    Current Medications and Allergies:    Current Outpatient Medications:  .  albuterol (VENTOLIN HFA) 108 (90 Base) MCG/ACT inhaler, Inhale 2 puffs into the lungs every 6 (six) hours as needed for wheezing or shortness of breath., Disp: 18 g, Rfl: 2 .  amLODipine (NORVASC) 10 MG tablet, Take 1 tablet (10 mg total) by mouth daily. (Patient taking differently: Take 10 mg by mouth every evening. ), Disp: 90 tablet, Rfl: 1 .  aspirin EC 81 MG tablet, Take 1 tablet (81 mg total) by mouth 2 (two) times daily after a meal., Disp: 30 tablet, Rfl: 0 .  brimonidine (ALPHAGAN) 0.2 % ophthalmic  solution, Place 1 drop into the right eye 2 (two) times daily., Disp: , Rfl:  .  latanoprost (XALATAN) 0.005 % ophthalmic solution, Place 1 drop into both eyes at bedtime. As directed , Disp: , Rfl:  .  levothyroxine (SYNTHROID) 100 MCG tablet, Take 1 tablet (100 mcg total) by mouth daily., Disp: 90 tablet, Rfl: 3 .  lisinopril-hydrochlorothiazide (ZESTORETIC) 20-25 MG tablet, Take 1 tablet by mouth daily., Disp: 90 tablet, Rfl: 0 .  metoprolol succinate (TOPROL-XL) 100 MG 24 hr tablet, Take 1 tablet (100 mg total) by mouth daily. Take with or immediately following a meal. (Patient taking differently: Take 100 mg by mouth every evening. Take with or immediately following a meal.), Disp: 90 tablet, Rfl: 1 .  pantoprazole (PROTONIX) 40 MG tablet, Take 1 tablet (40 mg total) by mouth daily., Disp: 30 tablet, Rfl: 11 .  Spacer/Aero-Holding Chambers DEVI, 1 each by Does not apply route once for 1 dose., Disp: 1 Units, Rfl: 0   Allergies  Allergen Reactions  . Doxycycline Nausea Only  . Codeine Nausea And Vomiting  . Pollen Extract     Review of Systems   ROS  Negative unless otherwise specified per HPI.  Vitals:   Vitals:   11/10/19 1336  BP: 130/70  Pulse: 65  Resp: 18  Temp: 98.7 F (37.1 C)  TempSrc: Temporal  SpO2: 96%  Weight: 220 lb 12.8 oz (100.2 kg)  Height: 5\' 7"  (1.702 m)     Body mass index is 34.58 kg/m.   Physical Exam:    Physical Exam Vitals and nursing note reviewed.  Constitutional:      General: She is not in acute distress.    Appearance: She is well-developed. She is not ill-appearing or toxic-appearing.  Cardiovascular:     Rate and Rhythm: Normal rate and regular rhythm.     Pulses: Normal pulses.     Heart sounds: Normal heart sounds, S1 normal and S2 normal.     Comments: No LE edema Pulmonary:     Effort: Pulmonary effort is normal.     Breath sounds: Normal breath sounds.  Musculoskeletal:     Comments: Shoulder exam: No deformity of  shoulders on inspection. No pain with palpation of shoulder landmarks. No pain with resisted abduction.  Skin:    General: Skin is warm and dry.  Neurological:     Mental Status: She is alert.     GCS: GCS eye subscore is 4. GCS verbal subscore is 5. GCS motor subscore is 6.     Cranial Nerves: Cranial nerves are intact.     Sensory: Sensation is intact.     Motor: Motor function is intact.     Coordination: Coordination is intact.     Comments: Grip strength 5/5 b/l  R eye with lateral fixed gaze  Psychiatric:        Speech: Speech normal.        Behavior: Behavior normal. Behavior is cooperative.        Assessment and Plan:    Denisha was seen today for hypertension.  Diagnoses and all orders for this visit:  Essential hypertension Controlled in our office today. Continue current regimen. She continues to try to get her TSH under control. I encouraged her to reach out to cardiology to schedule appointment for reassurance.  Unsteady gait Refer to PT. Neuro exam overall normal. -     Ambulatory referral to Physical Therapy  Acute pain of left shoulder Will obtain xray today.  She is agreeable to trialing Physical Therapy and then going to ortho or other provider if indicated.  No red flags on exam. -     Ambulatory referral to Physical Therapy -     DG Shoulder Left; Future  SOB (shortness of breath) Will provide spacer for patient to use to see if this helps her use  her inhaler better. Denies any chest pain or other cardiac concerns, however I still have asked her to follow-up with her cardiology for regular routine follow-up.  Elevated glucose Update HgbA1c today per patient request. -     Hemoglobin A1c; Future -     Hemoglobin A1c  Decreased GFR Update labs today. Further work-up/intervention based upon results. -     Basic metabolic panel; Future -     Basic metabolic panel  Other orders -     Spacer/Aero-Holding Chambers DEVI; 1 each by Does not  apply route once for 1 dose.  . Reviewed expectations re: course of current medical issues. . Discussed self-management of symptoms. . Outlined signs and symptoms indicating need for more acute intervention. . Patient verbalized understanding and all questions were answered. . See orders for this visit as documented in the electronic medical record. . Patient received an After Visit Summary.  CMA or LPN served as scribe during this visit. History, Physical, and Plan performed by medical provider. The above documentation has been reviewed and is accurate and complete.  Time spent with patient today was 25 minutes which consisted of chart review, discussing diagnosis, work up, treatment answering questions and documentation.  Inda Coke, PA-C [place] 11/10/2019  Follow-up: No follow-ups on file.

## 2019-11-11 ENCOUNTER — Telehealth: Payer: Self-pay | Admitting: Physician Assistant

## 2019-11-11 LAB — BASIC METABOLIC PANEL
BUN/Creatinine Ratio: 14 (calc) (ref 6–22)
BUN: 16 mg/dL (ref 7–25)
CO2: 30 mmol/L (ref 20–32)
Calcium: 10.8 mg/dL — ABNORMAL HIGH (ref 8.6–10.4)
Chloride: 101 mmol/L (ref 98–110)
Creat: 1.13 mg/dL — ABNORMAL HIGH (ref 0.60–0.93)
Glucose, Bld: 115 mg/dL — ABNORMAL HIGH (ref 65–99)
Potassium: 4.3 mmol/L (ref 3.5–5.3)
Sodium: 140 mmol/L (ref 135–146)

## 2019-11-11 LAB — HEMOGLOBIN A1C
Hgb A1c MFr Bld: 5.8 % of total Hgb — ABNORMAL HIGH (ref <5.7)
Mean Plasma Glucose: 120 (calc)
eAG (mmol/L): 6.6 (calc)

## 2019-11-11 NOTE — Telephone Encounter (Signed)
MAILBOX FULL the patient due to schedule Medicare Annual Wellness Visit (AWV) either virtually/audio only OR in office. Whatever the patients preference is.  Last AWV 11/13/18; please schedule 11/14/19 OR AFTER with [facility] Advisor at [facility].  This should be a 45 minute visit.

## 2019-11-17 ENCOUNTER — Ambulatory Visit (INDEPENDENT_AMBULATORY_CARE_PROVIDER_SITE_OTHER): Payer: Medicare Other

## 2019-11-17 VITALS — BP 160/110 | HR 78 | Resp 20 | Ht 67.0 in | Wt 219.8 lb

## 2019-11-17 DIAGNOSIS — Z1211 Encounter for screening for malignant neoplasm of colon: Secondary | ICD-10-CM

## 2019-11-17 DIAGNOSIS — Z0000 Encounter for general adult medical examination without abnormal findings: Secondary | ICD-10-CM

## 2019-11-17 DIAGNOSIS — R399 Unspecified symptoms and signs involving the genitourinary system: Secondary | ICD-10-CM

## 2019-11-17 DIAGNOSIS — Z1231 Encounter for screening mammogram for malignant neoplasm of breast: Secondary | ICD-10-CM

## 2019-11-17 NOTE — Progress Notes (Addendum)
Subjective:   Kari Coleman is a 69 y.o. female who presents for Medicare Annual (Subsequent) preventive examination.  Review of Systems     Cardiac Risk Factors include: advanced age (>52men, >64 women);hypertension;obesity (BMI >30kg/m2)     Objective:    Today's Vitals   11/17/19 1106  BP: (!) 160/110  Pulse: 78  Resp: 20  SpO2: 98%  Weight: 219 lb 12.8 oz (99.7 kg)  Height: 5\' 7"  (1.702 m)  PainSc: 6    Body mass index is 34.43 kg/m.  Advanced Directives 11/17/2019 07/31/2019 04/22/2019 04/18/2019 11/13/2018  Does Patient Have a Medical Advance Directive? No No No No No  Would patient like information on creating a medical advance directive? Yes (MAU/Ambulatory/Procedural Areas - Information given) - No - Patient declined No - Patient declined Yes (MAU/Ambulatory/Procedural Areas - Information given)    Current Medications (verified) Outpatient Encounter Medications as of 11/17/2019  Medication Sig  . albuterol (VENTOLIN HFA) 108 (90 Base) MCG/ACT inhaler Inhale 2 puffs into the lungs every 6 (six) hours as needed for wheezing or shortness of breath.  Marland Kitchen amLODipine (NORVASC) 10 MG tablet Take 1 tablet (10 mg total) by mouth daily. (Patient taking differently: Take 10 mg by mouth every evening. )  . aspirin EC 81 MG tablet Take 1 tablet (81 mg total) by mouth 2 (two) times daily after a meal.  . brimonidine (ALPHAGAN) 0.2 % ophthalmic solution Place 1 drop into the right eye 2 (two) times daily.  Marland Kitchen latanoprost (XALATAN) 0.005 % ophthalmic solution Place 1 drop into both eyes at bedtime. As directed   . levothyroxine (SYNTHROID) 100 MCG tablet Take 1 tablet (100 mcg total) by mouth daily.  Marland Kitchen lisinopril-hydrochlorothiazide (ZESTORETIC) 20-25 MG tablet Take 1 tablet by mouth daily.  . metoprolol succinate (TOPROL-XL) 100 MG 24 hr tablet Take 1 tablet (100 mg total) by mouth daily. Take with or immediately following a meal. (Patient taking differently: Take 100 mg by mouth every  evening. Take with or immediately following a meal.)  . pantoprazole (PROTONIX) 40 MG tablet Take 1 tablet (40 mg total) by mouth daily.   No facility-administered encounter medications on file as of 11/17/2019.    Allergies (verified) Doxycycline, Codeine, and Pollen extract   History: Past Medical History:  Diagnosis Date  . Arthritis   . Cataract   . Cervical cancer ([place])   . GERD (gastroesophageal reflux disease)   . Glaucoma 2017  . History of echocardiogram    Echo 9/16:  Mild LVH, EF 55-60%, no RWMA, Gr 1 DD, mild MR // Echo 1/17:  Mild LVH, EF 50-55%, Gr 1 DD, mild to mod MR, mild LAE  . Hypertension   . Hyperthyroidism    s/p RAI treatment  . Palpitations   . PONV (postoperative nausea and vomiting)    years ago  . Woke up and saw asian people   . 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 Tobacco abuse   . Tuberculosis    positive test as a caregiver cxr annually   . Urinary incontinence    Past Surgical History:  Procedure Laterality Date  . ABDOMINAL HYSTERECTOMY    . bladder surgey for incontinence    . EYE SURGERY    . REPAIR OF COMPLEX TRACTION RETINAL DETACHMENT    . thyroid radiation    . TOTAL ABDOMINAL HYSTERECTOMY W/ BILATERAL SALPINGOOPHORECTOMY    . TOTAL KNEE ARTHROPLASTY Right 04/22/2019   Procedure: RIGHT TOTAL  KNEE ARTHROPLASTY;  Surgeon: Melrose Nakayama, MD;  Location: [facility];  Service: Orthopedics;  Laterality: Right;  PENDING PRE-OP FOR SDDC APPROVAL   Family History  Problem Relation Age of Onset  . Hypertension Mother   . Heart failure Mother   . Diabetes Mother   . Pancreatic cancer Father   . Colon cancer Maternal Aunt   . Cancer Sister   . Thyroid disease Paternal Aunt   . Cancer Paternal Aunt   . Thyroid disease Other        paternal side  . Cancer Maternal Grandfather   . Diabetes Paternal Grandmother   . Diabetes Maternal Uncle    Social History   Socioeconomic History  . Marital status: Single    Spouse name:  Not on file  . Number of children: 2  . Years of education: Not on file  . Highest education level: Not on file  Occupational History  . Occupation: CNA    CommentPublic librarian  . Occupation: Retired  Tobacco Use  . Smoking status: Former Smoker    Packs/day: 0.50    Years: 48.00    Pack years: 24.00    Types: Cigarettes    Start date: 11/30/1968    Quit date: 10/31/2016    Years since quitting: 3.0  . Smokeless tobacco: Never Used  . Tobacco comment: did  not smoke more than .5 pack   Vaping Use  . Vaping Use: Never used  Substance and Sexual Activity  . Alcohol use: Not Currently    Alcohol/week: 0.0 standard drinks    Comment: rarely   once a year  . Drug use: No  . Sexual activity: Not Currently  Other Topics Concern  . Not on file  Social History Narrative   Caregiver   Daughter and grandson in household   Social Determinants of Health   Financial Resource Strain: Low Risk   . Difficulty of Paying Living Expenses: Not hard at all  Food Insecurity: No Food Insecurity  . Worried About Charity fundraiser in the Last Year: Never true  . Ran Out of Food in the Last Year: Never true  Transportation Needs: No Transportation Needs  . Lack of Transportation (Medical): No  . Lack of Transportation (Non-Medical): No  Physical Activity: Insufficiently Active  . Days of Exercise per Week: 3 days  . Minutes of Exercise per Session: 10 min  Stress: No Stress Concern Present  . Feeling of Stress : Only a little  Social Connections: Moderately Isolated  . Frequency of Communication with Friends and Family: More than three times a week  . Frequency of Social Gatherings with Friends and Family: Twice a week  . Attends Religious Services: 1 to 4 times per year  . Active Member of Clubs or Organizations: No  . Attends Archivist Meetings: Never  . Marital Status: Never married    Tobacco Counseling Counseling given: Not Answered Comment: did  not smoke more  than .5 pack    Clinical Intake:  Pre-visit preparation completed: Yes  Pain : 0-10 (6) Pain Score: 6  Pain Location: Generalized (body pain) Pain Descriptors / Indicators: Aching Pain Frequency: Intermittent     BMI - recorded: 34.43 Diabetes: No  How often do you need to have someone help you when you read instructions, pamphlets, or other written materials from your doctor or pharmacy?: 1 - Never  Diabetic?No  Interpreter Needed?: No  Information entered by :: Charlott Rakes, LPN  Activities of Daily Living In your present state of health, do you have any difficulty performing the following activities: 11/17/2019 04/18/2019  Hearing? N N  Vision? N Y  Difficulty concentrating or making decisions? N N  Walking or climbing stairs? N Y  Dressing or bathing? N N  Doing errands, shopping? N N  Preparing Food and eating ? N -  Using the Toilet? N -  In the past six months, have you accidently leaked urine? Y -  Comment wears depends  for urgency -  Do you have problems with loss of bowel control? N -  Managing your Medications? N -  Managing your Finances? N -  Housekeeping or managing your Housekeeping? N -  Some recent data might be hidden    Patient Care Team: Inda Coke, [place] as PCP - General (Physician Assistant) Gerda Diss, DO as Consulting Physician (Sports Medicine) Richardo Priest, MD as Consulting Physician (Cardiology)  Indicate any recent Medical Services you may have received from other than [facility] providers in the past year (date may be approximate).     Assessment:   This is a routine wellness examination for Geanie.  Hearing/Vision screen  Hearing Screening   125Hz  250Hz  500Hz  1000Hz  2000Hz  3000Hz  4000Hz  6000Hz  8000Hz   Right ear:           Left ear:           Comments: Pt denies any hearing difficulty  Vision Screening Comments: Pt follows up annually with Dr Delman Cheadle   Dietary issues and exercise activities discussed: Current  Exercise Habits: The patient does not participate in regular exercise at present  Goals    . Patient Stated     Lose weight    . Weight (lb) < 200 lb (90.7 kg)     Check out  online nutrition programs as GumSearch.nl and http://vang.com/; fit94me; Look for foods with "whole" wheat; bran; oatmeal etc Shot at the farmer's markets in season for fresher choices  Watch for "hydrogenated" on the label of oils which are trans-fats.  Watch for "high fructose corn syrup" in snacks, yogurt or ketchup  Meats have less marbling; bright colored fruits and vegetables;  Canned; dump out liquid and wash vegetables. Be mindful of what we are eating  Portion control is essential to a health weight! Sit down; take a break and enjoy your meal; take smaller bites; put the fork down between bites;  It takes 20 minutes to get full; so check in with your fullness cues and stop eating when you start to fill full             Depression Screen PHQ 2/9 Scores 11/17/2019 11/13/2018 11/08/2017 06/07/2017 06/07/2017 01/31/2017  PHQ - 2 Score 0 0 1 0 0 0    Fall Risk Fall Risk  11/17/2019 11/13/2018 11/08/2017 06/07/2017 01/31/2017  Falls in the past year? 0 0 No Yes Yes  Number falls in past yr: 0 0 - 2 or more 1  Injury with Fall? 0 0 - Yes No  Risk for fall due to : Impaired balance/gait;Impaired mobility;Impaired vision;Orthopedic patient Impaired mobility - Other (Comment) -  Risk for fall due to: Comment hx of knee replacment - - - -  Follow up Falls prevention discussed Education provided - - -    Any stairs in or around the home? Yes  If so, are there any without handrails? No  Home free of loose throw rugs in walkways, pet beds, electrical cords, etc? Yes  Adequate lighting in your home to reduce risk of falls? Yes   ASSISTIVE DEVICES UTILIZED TO PREVENT FALLS:  Life alert? No  Use of a cane, walker or w/c? No  Grab bars in the bathroom? No  Shower chair or bench in shower? Yes  Elevated toilet  seat or a handicapped toilet? Yes   TIMED UP AND GO:  Was the test performed? yes   Cognitive Function: MMSE - Mini Mental State Exam 06/07/2017  Not completed: (No Data)     6CIT Screen 11/17/2019  What Year? 0 points  What month? 0 points  Count back from 20 0 points  Months in reverse 0 points  Repeat phrase 4 points    Immunizations Immunization History  Administered Date(s) Administered  . Fluad Quad(high Dose 65+) 04/09/2019  . Influenza, High Dose Seasonal PF 06/10/2018  . PFIZER SARS-COV-2 Vaccination 06/15/2019, 07/07/2019  . PPD Test 02/29/2016    TDAP status: Due, Education has been provided regarding the importance of this vaccine. Advised may receive this vaccine at local pharmacy or Health Dept. Aware to provide a copy of the vaccination record if obtained from local pharmacy or Health Dept. Verbalized acceptance and understanding. Flu Vaccine status: Up to date Pneumococcal vaccine status: Declined,  Education has been provided regarding the importance of this vaccine but patient still declined. Advised may receive this vaccine at local pharmacy or Health Dept. Aware to provide a copy of the vaccination record if obtained from local pharmacy or Health Dept. Verbalized acceptance and understanding.  Covid-19 vaccine status: Completed vaccines  Qualifies for Shingles Vaccine? Yes   Zostavax completed No   Shingrix Completed?: No.    Education has been provided regarding the importance of this vaccine. Patient has been advised to call insurance company to determine out of pocket expense if they have not yet received this vaccine. Advised may also receive vaccine at local pharmacy or Health Dept. Verbalized acceptance and understanding.  Screening Tests Health Maintenance  Topic Date Due  . COLONOSCOPY  Never done  . PNA vac Low Risk Adult (1 of 2 - PCV13) Never done  . MAMMOGRAM  07/04/2019  . INFLUENZA VACCINE  11/02/2019  . TETANUS/TDAP  09/02/2020 (Originally  09/17/1969)  . DEXA SCAN  Completed  . COVID-19 Vaccine  Completed  . Hepatitis C Screening  Completed    Health Maintenance  Health Maintenance Due  Topic Date Due  . COLONOSCOPY  Never done  . PNA vac Low Risk Adult (1 of 2 - PCV13) Never done  . MAMMOGRAM  07/04/2019  . INFLUENZA VACCINE  11/02/2019    Colorectal cancer screening: Referral to GI placed 11/17/19. Pt aware the office will call re: appt. Mammogram status: Completed 07/03/17. Repeat every year Bone Density status: Completed 07/03/17. Results reflect: Bone density results: OSTEOPOROSIS. Repeat every 2 years.    Additional Screening:  Hepatitis C Screening: Completed 11/08/17  Vision Screening: Recommended annual ophthalmology exams for early detection of glaucoma and other disorders of the eye. Is the patient up to date with their annual eye exam?  Yes  Who is the provider or what is the name of the office in which the patient attends annual eye exams? Dr Delman Cheadle    Dental Screening: Recommended annual dental exams for proper oral hygiene  Community Resource Referral / Chronic Care Management: CRR required this visit?  No   CCM required this visit?  No      Plan:     I have personally reviewed  and noted the following in the patient's chart:   . Medical and social history . Use of alcohol, tobacco or illicit drugs  . Current medications and supplements . Functional ability and status . Nutritional status . Physical activity . Advanced directives . List of other physicians . Hospitalizations, surgeries, and ER visits in previous 12 months . Vitals . Screenings to include cognitive, depression, and falls . Referrals and appointments  In addition, I have reviewed and discussed with patient certain preventive protocols, quality metrics, and best practice recommendations. A written personalized care plan for preventive services as well as general preventive health recommendations were provided to patient.      Willette Brace, LPN   [id number]   Nurse Notes: Pt concerned with odor in urine since device implanted to help her know when it's urinating time. Also wants a referral for a GYN as well. During this visit B/P was 160/110 x 2 Pt stated she took B/P med this a.m. Pt also complains of itching in left arm,not topically states its in the inside of arm.   I have reviewed documentation for AWV and Advance Care planning provided by [facility], I agree with documentation, I was immediately available for any questions. Inda Coke, [place]

## 2019-11-17 NOTE — Patient Instructions (Addendum)
Kari Coleman , Thank you for taking time to come for your Medicare Wellness Visit. I appreciate your ongoing commitment to your health goals. Please review the following plan we discussed and let me know if I can assist you in the future.   Screening recommendations/referrals: Colonoscopy: Ordered 11/17/19 Mammogram: Done 07/03/17 Bone Density: Done 07/03/17 Recommended yearly ophthalmology/optometry visit for glaucoma screening and checkup Recommended yearly dental visit for hygiene and checkup  Vaccinations: Influenza vaccine: Done 04/09/19 Pneumococcal vaccine: Due and discussed Tdap vaccine: due  Discussed Shingles vaccine: Shingrix discussed. Please contact your pharmacy for coverage information.    Covid-19:Completed 06/15/19 &07/07/19  Advanced directives: Advance directive discussed with you today. I have provided a copy for you to complete at home and have notarized. Once this is complete please bring a copy in to our office so we can scan it into your chart.  Conditions/risks identified: Lose weight  Next appointment: Follow up in one year for your annual wellness visit    Preventive Care 65 Years and Older, Female Preventive care refers to lifestyle choices and visits with your health care provider that can promote health and wellness. What does preventive care include?  A yearly physical exam. This is also called an annual well check.  Dental exams once or twice a year.  Routine eye exams. Ask your health care provider how often you should have your eyes checked.  Personal lifestyle choices, including:  Daily care of your teeth and gums.  Regular physical activity.  Eating a healthy diet.  Avoiding tobacco and drug use.  Limiting alcohol use.  Practicing safe sex.  Taking low-dose aspirin every day.  Taking vitamin and mineral supplements as recommended by your health care provider. What happens during an annual well check? The services and screenings done by your  health care provider during your annual well check will depend on your age, overall health, lifestyle risk factors, and family history of disease. Counseling  Your health care provider may ask you questions about your:  Alcohol use.  Tobacco use.  Drug use.  Emotional well-being.  Home and relationship well-being.  Sexual activity.  Eating habits.  History of falls.  Memory and ability to understand (cognition).  Work and work Statistician.  Reproductive health. Screening  You may have the following tests or measurements:  Height, weight, and BMI.  Blood pressure.  Lipid and cholesterol levels. These may be checked every 5 years, or more frequently if you are over 29 years old.  Skin check.  Lung cancer screening. You may have this screening every year starting at age 73 if you have a 30-pack-year history of smoking and currently smoke or have quit within the past 15 years.  Fecal occult blood test (FOBT) of the stool. You may have this test every year starting at age 84.  Flexible sigmoidoscopy or colonoscopy. You may have a sigmoidoscopy every 5 years or a colonoscopy every 10 years starting at age 42.  Hepatitis C blood test.  Hepatitis B blood test.  Sexually transmitted disease (STD) testing.  Diabetes screening. This is done by checking your blood sugar (glucose) after you have not eaten for a while (fasting). You may have this done every 1-3 years.  Bone density scan. This is done to screen for osteoporosis. You may have this done starting at age 56.  Mammogram. This may be done every 1-2 years. Talk to your health care provider about how often you should have regular mammograms. Talk with your health care  provider about your test results, treatment options, and if necessary, the need for more tests. Vaccines  Your health care provider may recommend certain vaccines, such as:  Influenza vaccine. This is recommended every year.  Tetanus, diphtheria, and  acellular pertussis (Tdap, Td) vaccine. You may need a Td booster every 10 years.  Zoster vaccine. You may need this after age 48.  Pneumococcal 13-valent conjugate (PCV13) vaccine. One dose is recommended after age 25.  Pneumococcal polysaccharide (PPSV23) vaccine. One dose is recommended after age 28. Talk to your health care provider about which screenings and vaccines you need and how often you need them. This information is not intended to replace advice given to you by your health care provider. Make sure you discuss any questions you have with your health care provider. Document Released: 04/16/2015 Document Revised: 12/08/2015 Document Reviewed: 01/19/2015 Elsevier Interactive Patient Education  2017 [place] Prevention in the Home Falls can cause injuries. They can happen to people of all ages. There are many things you can do to make your home safe and to help prevent falls. What can I do on the outside of my home?  Regularly fix the edges of walkways and driveways and fix any cracks.  Remove anything that might make you trip as you walk through a door, such as a raised step or threshold.  Trim any bushes or trees on the path to your home.  Use bright outdoor lighting.  Clear any walking paths of anything that might make someone trip, such as rocks or tools.  Regularly check to see if handrails are loose or broken. Make sure that both sides of any steps have handrails.  Any raised decks and porches should have guardrails on the edges.  Have any leaves, snow, or ice cleared regularly.  Use sand or salt on walking paths during winter.  Clean up any spills in your garage right away. This includes oil or grease spills. What can I do in the bathroom?  Use night lights.  Install grab bars by the toilet and in the tub and shower. Do not use towel bars as grab bars.  Use non-skid mats or decals in the tub or shower.  If you need to sit down in the shower, use  a plastic, non-slip stool.  Keep the floor dry. Clean up any water that spills on the floor as soon as it happens.  Remove soap buildup in the tub or shower regularly.  Attach bath mats securely with double-sided non-slip rug tape.  Do not have throw rugs and other things on the floor that can make you trip. What can I do in the bedroom?  Use night lights.  Make sure that you have a light by your bed that is easy to reach.  Do not use any sheets or blankets that are too big for your bed. They should not hang down onto the floor.  Have a firm chair that has side arms. You can use this for support while you get dressed.  Do not have throw rugs and other things on the floor that can make you trip. What can I do in the kitchen?  Clean up any spills right away.  Avoid walking on wet floors.  Keep items that you use a lot in easy-to-reach places.  If you need to reach something above you, use a strong step stool that has a grab bar.  Keep electrical cords out of the way.  Do not use floor polish  or wax that makes floors slippery. If you must use wax, use non-skid floor wax.  Do not have throw rugs and other things on the floor that can make you trip. What can I do with my stairs?  Do not leave any items on the stairs.  Make sure that there are handrails on both sides of the stairs and use them. Fix handrails that are broken or loose. Make sure that handrails are as long as the stairways.  Check any carpeting to make sure that it is firmly attached to the stairs. Fix any carpet that is loose or worn.  Avoid having throw rugs at the top or bottom of the stairs. If you do have throw rugs, attach them to the floor with carpet tape.  Make sure that you have a light switch at the top of the stairs and the bottom of the stairs. If you do not have them, ask someone to add them for you. What else can I do to help prevent falls?  Wear shoes that:  Do not have high heels.  Have  rubber bottoms.  Are comfortable and fit you well.  Are closed at the toe. Do not wear sandals.  If you use a stepladder:  Make sure that it is fully opened. Do not climb a closed stepladder.  Make sure that both sides of the stepladder are locked into place.  Ask someone to hold it for you, if possible.  Clearly mark and make sure that you can see:  Any grab bars or handrails.  First and last steps.  Where the edge of each step is.  Use tools that help you move around (mobility aids) if they are needed. These include:  Canes.  Walkers.  Scooters.  Crutches.  Turn on the lights when you go into a dark area. Replace any light bulbs as soon as they burn out.  Set up your furniture so you have a clear path. Avoid moving your furniture around.  If any of your floors are uneven, fix them.  If there are any pets around you, be aware of where they are.  Review your medicines with your doctor. Some medicines can make you feel dizzy. This can increase your chance of falling. Ask your doctor what other things that you can do to help prevent falls. This information is not intended to replace advice given to you by your health care provider. Make sure you discuss any questions you have with your health care provider. Document Released: 01/14/2009 Document Revised: 08/26/2015 Document Reviewed: 04/24/2014 Elsevier Interactive Patient Education  2017 [employer].

## 2019-11-17 NOTE — Addendum Note (Signed)
Addended by: Erlene Quan on: 11/17/2019 04:22 PM   Modules accepted: Orders

## 2019-11-18 ENCOUNTER — Encounter: Payer: Self-pay | Admitting: Gastroenterology

## 2019-11-24 ENCOUNTER — Other Ambulatory Visit (HOSPITAL_COMMUNITY)
Admission: RE | Admit: 2019-11-24 | Discharge: 2019-11-24 | Disposition: A | Discharge status: Home / Self Care | Payer: Medicare Other | Source: Ambulatory Visit | Attending: Obstetrics and Gynecology | Admitting: Obstetrics and Gynecology

## 2019-11-24 ENCOUNTER — Encounter: Payer: Self-pay | Admitting: Obstetrics and Gynecology

## 2019-11-24 ENCOUNTER — Ambulatory Visit: Payer: Medicare Other | Admitting: Obstetrics and Gynecology

## 2019-11-24 ENCOUNTER — Other Ambulatory Visit: Payer: Self-pay

## 2019-11-24 VITALS — BP 150/82 | HR 72 | Temp 97.9°F | Ht 66.0 in | Wt 219.0 lb

## 2019-11-24 DIAGNOSIS — Z1272 Encounter for screening for malignant neoplasm of vagina: Secondary | ICD-10-CM

## 2019-11-24 DIAGNOSIS — Z01419 Encounter for gynecological examination (general) (routine) without abnormal findings: Secondary | ICD-10-CM

## 2019-11-24 NOTE — Progress Notes (Signed)
69 y.o. No obstetric history on file. Single Black or African American Not Hispanic or Latino female here for lower urinary tract problems. She says that she has an implant for her bladder. She has been on an antibiotic but that has been awhile ago. She says that she had a watery blood when she urinated one day. She went to urology but states they did not find anything.     Not currently having any problems. She had some hematuria, was seen with Dr Matilde Sprang. Work up was negative.   She has an interstim device, placed Dr Matilde Sprang. She leaks all day long, random. Wears depends. The Urologist tried her on medication, worked a little.  H/O hysterectomy in the 41's. She reports abnormal pap's in the past. She thinks she had precancer of her cervix, thinks that's why she had the hysterectomy. The chart lists h/o cervical cancer (can't find the records).  No vaginal bleeding.   No LMP recorded. Patient is postmenopausal.          Sexually active: No.  The current method of family planning is status post hysterectomy.    Exercising: Yes.    dancing and walking  Smoker:  no  Health Maintenance: Pap:  Years ago  History of abnormal Pap:  Yes years ago  MMG:  07/04/17 Density B Bi-rads 2 benign  BMD:   07/03/17 Normal Colonoscopy: scheduled for next month  TDaP:  Unsure  Gardasil: none    reports that she quit smoking about 3 years ago. Her smoking use included cigarettes. She started smoking about 51 years ago. She has a 24.00 pack-year smoking history. She has never used smokeless tobacco. She reports previous alcohol use. She reports that she does not use drugs.She has 2 daughters, 2 grand kids, 2 great grand kids   Past Medical History:  Diagnosis Date  . Arthritis   . Cataract   . Cervical cancer ([place])   . GERD (gastroesophageal reflux disease)   . Glaucoma 2017  . History of echocardiogram    Echo 9/16:  Mild LVH, EF 55-60%, no RWMA, Gr 1 DD, mild MR // Echo 1/17:  Mild LVH, EF 50-55%,  Gr 1 DD, mild to mod MR, mild LAE  . Hypertension   . Hyperthyroidism    s/p RAI treatment  . Palpitations   . PONV (postoperative nausea and vomiting)    years ago  . Woke up and saw asian people   . 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 Tobacco abuse   . Tuberculosis    positive test as a caregiver cxr annually   . Urinary incontinence     Past Surgical History:  Procedure Laterality Date  . ABDOMINAL HYSTERECTOMY  1984  . bladder surgey for incontinence    . EYE SURGERY    . REPAIR OF COMPLEX TRACTION RETINAL DETACHMENT    . thyroid radiation    . TOTAL ABDOMINAL HYSTERECTOMY W/ BILATERAL SALPINGOOPHORECTOMY    . TOTAL KNEE ARTHROPLASTY Right 04/22/2019   Procedure: RIGHT TOTAL KNEE ARTHROPLASTY;  Surgeon: Melrose Nakayama, MD;  Location: [facility];  Service: Orthopedics;  Laterality: Right;  PENDING PRE-OP FOR SDDC APPROVAL    Current Outpatient Medications  Medication Sig Dispense Refill  . albuterol (VENTOLIN HFA) 108 (90 Base) MCG/ACT inhaler Inhale 2 puffs into the lungs every 6 (six) hours as needed for wheezing or shortness of breath. 18 g 2  . amLODipine (NORVASC) 10 MG tablet Take 1 tablet (10  mg total) by mouth daily. (Patient taking differently: Take 10 mg by mouth every evening. ) 90 tablet 1  . aspirin EC 81 MG tablet Take 1 tablet (81 mg total) by mouth 2 (two) times daily after a meal. 30 tablet 0  . brimonidine (ALPHAGAN) 0.2 % ophthalmic solution Place 1 drop into the right eye 2 (two) times daily.    Marland Kitchen latanoprost (XALATAN) 0.005 % ophthalmic solution Place 1 drop into both eyes at bedtime. As directed     . levothyroxine (SYNTHROID) 100 MCG tablet Take 1 tablet (100 mcg total) by mouth daily. 90 tablet 3  . lisinopril-hydrochlorothiazide (ZESTORETIC) 20-25 MG tablet Take 1 tablet by mouth daily. 90 tablet 0  . metoprolol succinate (TOPROL-XL) 100 MG 24 hr tablet Take 1 tablet (100 mg total) by mouth daily. Take with or immediately following a meal.  (Patient taking differently: Take 100 mg by mouth every evening. Take with or immediately following a meal.) 90 tablet 1  . pantoprazole (PROTONIX) 40 MG tablet Take 1 tablet (40 mg total) by mouth daily. 30 tablet 11   No current facility-administered medications for this visit.    Family History  Problem Relation Age of Onset  . Hypertension Mother   . Heart failure Mother   . Diabetes Mother   . Pancreatic cancer Father   . Colon cancer Maternal Aunt   . Cancer Sister   . Thyroid disease Paternal Aunt   . Cancer Paternal Aunt   . Thyroid disease Other        paternal side  . Cancer Maternal Grandfather   . Diabetes Paternal Grandmother   . Diabetes Maternal Uncle     Review of Systems  Genitourinary: Positive for frequency.  All other systems reviewed and are negative.   Exam:   BP (!) 150/82   Pulse 72   Temp 97.9 F (36.6 C)   Ht 5\' 6"  (1.676 m)   Wt 219 lb (99.3 kg)   SpO2 98%   BMI 35.35 kg/m   Weight change: @WEIGHTCHANGE @ Height:   Height: 5\' 6"  (167.6 cm)  Ht Readings from Last 3 Encounters:  11/24/19 5\' 6"  (1.676 m)  11/17/19 5\' 7"  (1.702 m)  11/10/19 5\' 7"  (1.702 m)    General appearance: alert, cooperative and appears stated age Breasts: normal appearance, no masses or tenderness Abdomen: soft, non-tender; non distended,  no masses,  no organomegaly Extremities: extremities normal, atraumatic, no cyanosis or edema Skin: Skin color, texture, turgor normal. No rashes or lesions Lymph nodes: Cervical, supraclavicular, and axillary nodes normal. No abnormal inguinal nodes palpated Neurologic: Grossly normal   Pelvic: External genitalia:  no lesions              Urethra:  normal appearing urethra with no masses, tenderness or lesions              Bartholins and Skenes: normal                 Vagina: normal appearing vagina with normal color and discharge, no lesions              Cervix: absent               Bimanual Exam:  Uterus:  uterus absent               Adnexa: no mass, fullness, tenderness               Rectovaginal: Confirms  Anus:  normal sphincter tone, no lesions  Gae Dry chaperoned for the exam.  A:  GYN exam  H/O hysterectomy,   ? H/o cervical dysplasia vs cervical cancer (in the 80's)  Urinary Incontinence, followed by Urology  P:   Pap from vaginal apex  Mammogram overdue  Colonoscopy scheduled  DEXA UTD and normal  Discussed breast self exam  Discussed calcium and vit D intake

## 2019-11-24 NOTE — Patient Instructions (Signed)
EXERCISE AND DIET:  We recommended that you start or continue a regular exercise program for good health. Regular exercise means any activity that makes your heart beat faster and makes you sweat.  We recommend exercising at least 30 minutes per day at least 3 days a week, preferably 4 or 5.  We also recommend a diet low in fat and sugar.  Inactivity, poor dietary choices and obesity can cause diabetes, heart attack, stroke, and kidney damage, among others.    ALCOHOL AND SMOKING:  Women should limit their alcohol intake to no more than 7 drinks/beers/glasses of wine (combined, not each!) per week. Moderation of alcohol intake to this level decreases your risk of breast cancer and liver damage. And of course, no recreational drugs are part of a healthy lifestyle.  And absolutely no smoking or even second hand smoke. Most people know smoking can cause heart and lung diseases, but did you know it also contributes to weakening of your bones? Aging of your skin?  Yellowing of your teeth and nails?  CALCIUM AND VITAMIN D:  Adequate intake of calcium and Vitamin D are recommended.  The recommendations for exact amounts of these supplements seem to change often, but generally speaking 1,200 mg of calcium (between diet and supplement) and 800 units of Vitamin D per day seems prudent. Certain women may benefit from higher intake of Vitamin D.  If you are among these women, your doctor will have told you during your visit.    PAP SMEARS:  Pap smears, to check for cervical cancer or precancers,  have traditionally been done yearly, although recent scientific advances have shown that most women can have pap smears less often.  However, every woman still should have a physical exam from her gynecologist every year. It will include a breast check, inspection of the vulva and vagina to check for abnormal growths or skin changes, a visual exam of the cervix, and then an exam to evaluate the size and shape of the uterus and  ovaries.  And after 71 years of age, a rectal exam is indicated to check for rectal cancers. We will also provide age appropriate advice regarding health maintenance, like when you should have certain vaccines, screening for sexually transmitted diseases, bone density testing, colonoscopy, mammograms, etc.   MAMMOGRAMS:  All women over 40 years old should have a yearly mammogram. Many facilities now offer a "3D" mammogram, which may cost around $50 extra out of pocket. If possible,  we recommend you accept the option to have the 3D mammogram performed.  It both reduces the number of women who will be called back for extra views which then turn out to be normal, and it is better than the routine mammogram at detecting truly abnormal areas.    COLON CANCER SCREENING: Now recommend starting at age 45. At this time colonoscopy is not covered for routine screening until 50. There are take home tests that can be done between 45-49.   COLONOSCOPY:  Colonoscopy to screen for colon cancer is recommended for all women at age 50.  We know, you hate the idea of the prep.  We agree, BUT, having colon cancer and not knowing it is worse!!  Colon cancer so often starts as a polyp that can be seen and removed at colonscopy, which can quite literally save your life!  And if your first colonoscopy is normal and you have no family history of colon cancer, most women don't have to have it again for   10 years.  Once every ten years, you can do something that may end up saving your life, right?  We will be happy to help you get it scheduled when you are ready.  Be sure to check your insurance coverage so you understand how much it will cost.  It may be covered as a preventative service at no cost, but you should check your particular policy.      Breast Self-Awareness Breast self-awareness means being familiar with how your breasts look and feel. It involves checking your breasts regularly and reporting any changes to your  health care provider. Practicing breast self-awareness is important. A change in your breasts can be a sign of a serious medical problem. Being familiar with how your breasts look and feel allows you to find any problems early, when treatment is more likely to be successful. All women should practice breast self-awareness, including women who have had breast implants. How to do a breast self-exam One way to learn what is normal for your breasts and whether your breasts are changing is to do a breast self-exam. To do a breast self-exam: Look for Changes  1. Remove all the clothing above your waist. 2. Stand in front of a mirror in a room with good lighting. 3. Put your hands on your hips. 4. Push your hands firmly downward. 5. Compare your breasts in the mirror. Look for differences between them (asymmetry), such as: ? Differences in shape. ? Differences in size. ? Puckers, dips, and bumps in one breast and not the other. 6. Look at each breast for changes in your skin, such as: ? Redness. ? Scaly areas. 7. Look for changes in your nipples, such as: ? Discharge. ? Bleeding. ? Dimpling. ? Redness. ? A change in position. Feel for Changes Carefully feel your breasts for lumps and changes. It is best to do this while lying on your back on the floor and again while sitting or standing in the shower or tub with soapy water on your skin. Feel each breast in the following way:  Place the arm on the side of the breast you are examining above your head.  Feel your breast with the other hand.  Start in the nipple area and make  inch (2 cm) overlapping circles to feel your breast. Use the pads of your three middle fingers to do this. Apply light pressure, then medium pressure, then firm pressure. The light pressure will allow you to feel the tissue closest to the skin. The medium pressure will allow you to feel the tissue that is a little deeper. The firm pressure will allow you to feel the tissue  close to the ribs.  Continue the overlapping circles, moving downward over the breast until you feel your ribs below your breast.  Move one finger-width toward the center of the body. Continue to use the  inch (2 cm) overlapping circles to feel your breast as you move slowly up toward your collarbone.  Continue the up and down exam using all three pressures until you reach your armpit.  Write Down What You Find  Write down what is normal for each breast and any changes that you find. Keep a written record with breast changes or normal findings for each breast. By writing this information down, you do not need to depend only on memory for size, tenderness, or location. Write down where you are in your menstrual cycle, if you are still menstruating. If you are having trouble noticing differences   in your breasts, do not get discouraged. With time you will become more familiar with the variations in your breasts and more comfortable with the exam. How often should I examine my breasts? Examine your breasts every month. If you are breastfeeding, the best time to examine your breasts is after a feeding or after using a breast pump. If you menstruate, the best time to examine your breasts is 5-7 days after your period is over. During your period, your breasts are lumpier, and it may be more difficult to notice changes. When should I see my health care provider? See your health care provider if you notice:  A change in shape or size of your breasts or nipples.  A change in the skin of your breast or nipples, such as a reddened or scaly area.  Unusual discharge from your nipples.  A lump or thick area that was not there before.  Pain in your breasts.  Anything that concerns you.  

## 2019-11-25 LAB — CYTOLOGY - PAP: Diagnosis: NEGATIVE

## 2019-11-27 ENCOUNTER — Ambulatory Visit: Payer: Medicare Other | Admitting: Physical Therapy

## 2019-11-28 ENCOUNTER — Other Ambulatory Visit: Payer: Self-pay | Admitting: Physician Assistant

## 2019-12-18 ENCOUNTER — Ambulatory Visit: Payer: Medicare Other | Admitting: Physical Therapy

## 2020-01-05 ENCOUNTER — Ambulatory Visit: Payer: Medicare Other | Admitting: Physical Therapy

## 2020-01-16 ENCOUNTER — Other Ambulatory Visit: Payer: Self-pay

## 2020-01-16 ENCOUNTER — Ambulatory Visit (AMBULATORY_SURGERY_CENTER): Payer: Self-pay | Admitting: *Deleted

## 2020-01-16 VITALS — Ht 67.0 in | Wt 219.0 lb

## 2020-01-16 DIAGNOSIS — Z1211 Encounter for screening for malignant neoplasm of colon: Secondary | ICD-10-CM

## 2020-01-16 NOTE — Progress Notes (Signed)

## 2020-01-19 ENCOUNTER — Encounter: Payer: Self-pay | Admitting: Gastroenterology

## 2020-01-22 ENCOUNTER — Ambulatory Visit: Payer: Medicare Other | Admitting: Endocrinology

## 2020-01-22 ENCOUNTER — Other Ambulatory Visit: Payer: Self-pay

## 2020-01-22 VITALS — BP 142/90 | HR 73 | Ht 67.0 in | Wt 220.4 lb

## 2020-01-22 DIAGNOSIS — E042 Nontoxic multinodular goiter: Secondary | ICD-10-CM

## 2020-01-22 DIAGNOSIS — E890 Postprocedural hypothyroidism: Secondary | ICD-10-CM

## 2020-01-22 DIAGNOSIS — E559 Vitamin D deficiency, unspecified: Secondary | ICD-10-CM | POA: Diagnosis not present

## 2020-01-22 LAB — VITAMIN D 25 HYDROXY (VIT D DEFICIENCY, FRACTURES): VITD: 15.83 ng/mL — ABNORMAL LOW (ref 30.00–100.00)

## 2020-01-22 LAB — T4, FREE: Free T4: 0.61 ng/dL (ref 0.60–1.60)

## 2020-01-22 LAB — TSH: TSH: 5.66 u[IU]/mL — ABNORMAL HIGH (ref 0.35–4.50)

## 2020-01-22 MED ORDER — LEVOTHYROXINE SODIUM 125 MCG PO TABS: 125.0000 ug | ORAL_TABLET | Freq: Every day | ORAL | 3 refills | Status: DC

## 2020-01-22 NOTE — Progress Notes (Signed)
Subjective:    Patient ID: Kari Coleman, female    DOB: Nov 08, 1950, 69 y.o.   MRN: [id number]  HPI Pt returns for f/u of post-RAI hypothyroidism and MNG (dx'ed 2013; she was initially rx'ed tapazole; [place] (2015) showed MNG; NM scan (2016) showed MNG with normal uptake (24%); Cytology (2016) showed [place] CATEGORY 2 and 3; she took RAI rx 4/20; she started synthroid later in 2020).  pt states she feels well in general.  She says she seldom misses synthroid. She takes Vit-D, 2000 units/day, as rx'ed.   Past Medical History:  Diagnosis Date  . Allergy   . Arthritis   . Cataract   . Cervical cancer ([place])   . GERD (gastroesophageal reflux disease)   . Glaucoma 2017  . History of echocardiogram    Echo 9/16:  Mild LVH, EF 55-60%, no RWMA, Gr 1 DD, mild MR // Echo 1/17:  Mild LVH, EF 50-55%, Gr 1 DD, mild to mod MR, mild LAE  . Hypertension   . Hyperthyroidism    s/p RAI treatment  . Palpitations   . PONV (postoperative nausea and vomiting)    years ago  . Woke up and saw asian people   . 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 Tobacco abuse   . Tuberculosis    positive test as a caregiver cxr annually   . Urinary incontinence     Past Surgical History:  Procedure Laterality Date  . ABDOMINAL HYSTERECTOMY  1984  . bladder surgey for incontinence    . EYE SURGERY    . REPAIR OF COMPLEX TRACTION RETINAL DETACHMENT    . thyroid radiation    . TOTAL ABDOMINAL HYSTERECTOMY W/ BILATERAL SALPINGOOPHORECTOMY    . TOTAL KNEE ARTHROPLASTY Right 04/22/2019   Procedure: RIGHT TOTAL KNEE ARTHROPLASTY;  Surgeon: Melrose Nakayama, MD;  Location: [facility];  Service: Orthopedics;  Laterality: Right;  PENDING PRE-OP FOR SDDC APPROVAL    Social History   Socioeconomic History  . Marital status: Single    Spouse name: Not on file  . Number of children: 2  . Years of education: Not on file  . Highest education level: Not on file  Occupational History  . Occupation: CNA    CommentChiropodist  . Occupation: Retired  Tobacco Use  . Smoking status: Former Smoker    Packs/day: 0.50    Years: 48.00    Pack years: 24.00    Types: Cigarettes    Start date: 11/30/1968    Quit date: 10/31/2016    Years since quitting: 3.2  . Smokeless tobacco: Never Used  . Tobacco comment: did  not smoke more than .5 pack   Vaping Use  . Vaping Use: Never used  Substance and Sexual Activity  . Alcohol use: Not Currently    Alcohol/week: 0.0 standard drinks    Comment: rarely once a year  . Drug use: No  . Sexual activity: Not Currently  Other Topics Concern  . Not on file  Social History Narrative   Caregiver   Daughter and grandson in household   Social Determinants of Health   Financial Resource Strain: Low Risk   . Difficulty of Paying Living Expenses: Not hard at all  Food Insecurity: No Food Insecurity  . Worried About Charity fundraiser in the Last Year: Never true  . Ran Out of Food in the Last Year: Never true  Transportation Needs: No Transportation Needs  .  Lack of Transportation (Medical): No  . Lack of Transportation (Non-Medical): No  Physical Activity: Insufficiently Active  . Days of Exercise per Week: 3 days  . Minutes of Exercise per Session: 10 min  Stress: No Stress Concern Present  . Feeling of Stress : Only a little  Social Connections: Moderately Isolated  . Frequency of Communication with Friends and Family: More than three times a week  . Frequency of Social Gatherings with Friends and Family: Twice a week  . Attends Religious Services: 1 to 4 times per year  . Active Member of Clubs or Organizations: No  . Attends Archivist Meetings: Never  . Marital Status: Never married  Intimate Partner Violence: Not At Risk  . Fear of Current or Ex-Partner: No  . Emotionally Abused: No  . Physically Abused: No  . Sexually Abused: No    Current Outpatient Medications on File Prior to Visit  Medication Sig Dispense Refill  .  acetaminophen (TYLENOL) 325 MG tablet Take 650 mg by mouth every 4 (four) hours as needed.    Marland Kitchen albuterol (VENTOLIN HFA) 108 (90 Base) MCG/ACT inhaler Inhale 2 puffs into the lungs every 6 (six) hours as needed for wheezing or shortness of breath. 18 g 2  . amLODipine (NORVASC) 10 MG tablet Take 1 tablet by mouth once daily 90 tablet 0  . aspirin EC 81 MG tablet Take 1 tablet (81 mg total) by mouth 2 (two) times daily after a meal. (Patient taking differently: Take 81 mg by mouth once. ) 30 tablet 0  . latanoprost (XALATAN) 0.005 % ophthalmic solution Place 1 drop into both eyes at bedtime. As directed     . lisinopril-hydrochlorothiazide (ZESTORETIC) 20-25 MG tablet Take 1 tablet by mouth daily. 90 tablet 0  . metoprolol succinate (TOPROL-XL) 100 MG 24 hr tablet Take 1 tablet (100 mg total) by mouth daily. Take with or immediately following a meal. (Patient taking differently: Take 100 mg by mouth every evening. Take with or immediately following a meal.) 90 tablet 1  . pantoprazole (PROTONIX) 40 MG tablet Take 1 tablet (40 mg total) by mouth daily. 30 tablet 11  . brimonidine (ALPHAGAN) 0.2 % ophthalmic solution Place 1 drop into the right eye 2 (two) times daily. (Patient not taking: Reported on 01/22/2020)     No current facility-administered medications on file prior to visit.    Allergies  Allergen Reactions  . Doxycycline Nausea Only  . Codeine Nausea And Vomiting  . Pollen Extract     Family History  Problem Relation Age of Onset  . Hypertension Mother   . Heart failure Mother   . Diabetes Mother   . Pancreatic cancer Father   . Colon cancer Maternal Aunt   . Cancer Sister   . Thyroid disease Paternal Aunt   . Cancer Paternal Aunt   . Thyroid disease Other        paternal side  . Cancer Maternal Grandfather   . Diabetes Paternal Grandmother   . Diabetes Maternal Uncle   . Colon polyps Neg Hx   . Stomach cancer Neg Hx     BP (!) 142/90 (BP Location: Left Arm, Patient  Position: Sitting, Cuff Size: Normal)   Pulse 73   Ht 5\' 7"  (1.702 m)   Wt 220 lb 6.4 oz (100 kg)   SpO2 94%   BMI 34.52 kg/m   Review of Systems     Objective:   Physical Exam VITAL SIGNS:  See vs page  GENERAL: no distress NECK: thyroid seems slightly enlarged, with irreg surface, but this is uncertain  Lab Results  Component Value Date   TSH 5.66 (H) 01/22/2020      Assessment & Plan:  Hypothyroidism: she needs increased rx Vit-D def: uncontrolled increase synthroid. Also, please increase the vitamin-D to 4000 units/day.

## 2020-01-22 NOTE — Patient Instructions (Addendum)
Your blood pressure is high today.  Please see your primary care provider soon, to have it rechecked Blood tests are requested for you today.  We'll let you know about the results.  It is best to never miss the levothyroxine.  However, if you do miss it, next best is to double up the next time. Let's recheck the ultrasound.  you will receive a phone call, about a day and time for an appointment.  Please come back for a follow-up appointment in 4 months.

## 2020-01-23 ENCOUNTER — Telehealth: Payer: Self-pay

## 2020-01-23 NOTE — Telephone Encounter (Signed)
Left message for patient to call back regarding results and recommendations. °

## 2020-01-23 NOTE — Progress Notes (Signed)
Results given to patient

## 2020-01-23 NOTE — Telephone Encounter (Signed)
-----   Message from Renato Shin, MD sent at 01/22/2020  6:38 PM EDT ----- please contact patient: I have sent a prescription to your pharmacy, to increase the thyroid pill.  Also, please increase the vitamin-D to 4000 units/day.

## 2020-01-28 ENCOUNTER — Other Ambulatory Visit: Payer: Self-pay | Admitting: Physician Assistant

## 2020-01-28 ENCOUNTER — Other Ambulatory Visit: Payer: Medicare Other

## 2020-01-30 ENCOUNTER — Ambulatory Visit (AMBULATORY_SURGERY_CENTER): Payer: Medicare Other | Admitting: Gastroenterology

## 2020-01-30 ENCOUNTER — Other Ambulatory Visit: Payer: Self-pay

## 2020-01-30 ENCOUNTER — Encounter: Payer: Self-pay | Admitting: Gastroenterology

## 2020-01-30 VITALS — BP 135/81 | HR 58 | Temp 96.8°F | Resp 18 | Ht 67.0 in | Wt 219.0 lb

## 2020-01-30 DIAGNOSIS — Z1211 Encounter for screening for malignant neoplasm of colon: Secondary | ICD-10-CM | POA: Diagnosis not present

## 2020-01-30 DIAGNOSIS — D123 Benign neoplasm of transverse colon: Secondary | ICD-10-CM

## 2020-01-30 DIAGNOSIS — K635 Polyp of colon: Secondary | ICD-10-CM | POA: Diagnosis not present

## 2020-01-30 MED ORDER — SODIUM CHLORIDE 0.9 % IV SOLN: 500.0000 mL | Freq: Once | INTRAVENOUS | Status: DC

## 2020-01-30 NOTE — Progress Notes (Signed)
Called to room to assist during endoscopic procedure.  Patient ID and intended procedure confirmed with present staff. Received instructions for my participation in the procedure from the performing physician.  

## 2020-01-30 NOTE — Progress Notes (Signed)
Pt's states no medical or surgical changes since previsit or office visit. 

## 2020-01-30 NOTE — Progress Notes (Signed)
Report to PACU, RN, vss, BBS= Clear.  

## 2020-01-30 NOTE — Patient Instructions (Signed)
Thank you for letting us take care of your healthcare needs today. Please see handouts given to you on Polyps and Hemorrhoids.     YOU HAD AN ENDOSCOPIC PROCEDURE TODAY AT THE [facility]:   Refer to the procedure report that was given to you for any specific questions about what was found during the examination.  If the procedure report does not answer your questions, please call your gastroenterologist to clarify.  If you requested that your care partner not be given the details of your procedure findings, then the procedure report has been included in a sealed envelope for you to review at your convenience later.  YOU SHOULD EXPECT: Some feelings of bloating in the abdomen. Passage of more gas than usual.  Walking can help get rid of the air that was put into your GI tract during the procedure and reduce the bloating. If you had a lower endoscopy (such as a colonoscopy or flexible sigmoidoscopy) you may notice spotting of blood in your stool or on the toilet paper. If you underwent a bowel prep for your procedure, you may not have a normal bowel movement for a few days.  Please Note:  You might notice some irritation and congestion in your nose or some drainage.  This is from the oxygen used during your procedure.  There is no need for concern and it should clear up in a day or so.  SYMPTOMS TO REPORT IMMEDIATELY:  Following lower endoscopy (colonoscopy or flexible sigmoidoscopy):  Excessive amounts of blood in the stool  Significant tenderness or worsening of abdominal pains  Swelling of the abdomen that is new, acute  Fever of 100F or higher  For urgent or emergent issues, a gastroenterologist can be reached at any hour by calling [phone]. Do not use MyChart messaging for urgent concerns.    DIET:  We do recommend a small meal at first, but then you may proceed to your regular diet.  Drink plenty of fluids but you should avoid alcoholic beverages for 24  hours.  ACTIVITY:  You should plan to take it easy for the rest of today and you should NOT DRIVE or use heavy machinery until tomorrow (because of the sedation medicines used during the test).    FOLLOW UP: Our staff will call the number listed on your records 48-72 hours following your procedure to check on you and address any questions or concerns that you may have regarding the information given to you following your procedure. If we do not reach you, we will leave a message.  We will attempt to reach you two times.  During this call, we will ask if you have developed any symptoms of COVID 19. If you develop any symptoms (ie: fever, flu-like symptoms, shortness of breath, cough etc.) before then, please call [phone].  If you test positive for Covid 19 in the 2 weeks post procedure, please call and report this information to us.    If any biopsies were taken you will be contacted by phone or by letter within the next 1-3 weeks.  Please call us at [phone] if you have not heard about the biopsies in 3 weeks.    SIGNATURES/CONFIDENTIALITY: You and/or your care partner have signed paperwork which will be entered into your electronic medical record.  These signatures attest to the fact that that the information above on your After Visit Summary has been reviewed and is understood.  Full responsibility of the confidentiality of this discharge information   lies with you and/or your care-partner.  

## 2020-01-30 NOTE — Op Note (Signed)
[facility] Patient Name: Kari Coleman Procedure Date: 01/30/2020 11:16 AM MRN: [id number] Endoscopist: Milus Banister , MD Age: 69 Referring MD:  Date of Birth: [birth year] Gender: Female Account #: [id number] Procedure:                Colonoscopy Indications:              Screening for colorectal malignant neoplasm Medicines:                Monitored Anesthesia Care Procedure:                Pre-Anesthesia Assessment:                           - Prior to the procedure, a History and Physical                            was performed, and patient medications and                            allergies were reviewed. The patient's tolerance of                            previous anesthesia was also reviewed. The risks                            and benefits of the procedure and the sedation                            options and risks were discussed with the patient.                            All questions were answered, and informed consent                            was obtained. Prior Anticoagulants: The patient has                            taken no previous anticoagulant or antiplatelet                            agents. ASA Grade Assessment: II - A patient with                            mild systemic disease. After reviewing the risks                            and benefits, the patient was deemed in                            satisfactory condition to undergo the procedure.                           After obtaining informed consent, the colonoscope  was passed under direct vision. Throughout the                            procedure, the patient's blood pressure, pulse, and                            oxygen saturations were monitored continuously. The                            Colonoscope was introduced through the anus and                            advanced to the the cecum, identified by                            appendiceal orifice and  ileocecal valve. The                            colonoscopy was performed without difficulty. The                            patient tolerated the procedure well. The quality                            of the bowel preparation was good. The ileocecal                            valve, appendiceal orifice, and rectum were                            photographed. Scope In: 11:20:06 AM Scope Out: 11:34:15 AM Scope Withdrawal Time: 0 hours 11 minutes 16 seconds  Total Procedure Duration: 0 hours 14 minutes 9 seconds  Findings:                 A 2 mm polyp was found in the transverse colon. The                            polyp was sessile. The polyp was removed with a                            cold snare. Resection and retrieval were complete.                           Internal hemorrhoids were found. The hemorrhoids                            were small.                           The exam was otherwise without abnormality on                            direct and retroflexion views. Complications:  No immediate complications. Estimated blood loss:                            None. Estimated Blood Loss:     Estimated blood loss: none. Impression:               - One 2 mm polyp in the transverse colon, removed                            with a cold snare. Resected and retrieved.                           - Internal hemorrhoids.                           - The examination was otherwise normal on direct                            and retroflexion views. Recommendation:           - Patient has a contact number available for                            emergencies. The signs and symptoms of potential                            delayed complications were discussed with the                            patient. Return to normal activities tomorrow.                            Written discharge instructions were provided to the                            patient.                           -  Resume previous diet.                           - Continue present medications.                           - Await pathology results. Milus Banister, MD 01/30/2020 11:40:05 AM This report has been signed electronically.

## 2020-02-03 ENCOUNTER — Telehealth: Payer: Self-pay | Admitting: *Deleted

## 2020-02-03 NOTE — Telephone Encounter (Signed)
Left message on 2nd f/u call 

## 2020-02-03 NOTE — Telephone Encounter (Signed)
Left message on f/u call 

## 2020-02-04 ENCOUNTER — Ambulatory Visit
Admission: RE | Admit: 2020-02-04 | Discharge: 2020-02-04 | Disposition: A | Discharge status: Home / Self Care | Payer: Medicare Other | Source: Ambulatory Visit | Attending: Endocrinology | Admitting: Endocrinology

## 2020-02-04 DIAGNOSIS — E042 Nontoxic multinodular goiter: Secondary | ICD-10-CM

## 2020-02-06 ENCOUNTER — Telehealth: Payer: Self-pay

## 2020-02-06 NOTE — Telephone Encounter (Signed)
-----   Message from Renato Shin, MD sent at 02/06/2020 11:47 AM EDT ----- please contact patient: Looks good.  Nothing to worry about.

## 2020-02-10 ENCOUNTER — Encounter: Payer: Self-pay | Admitting: Gastroenterology

## 2020-03-19 ENCOUNTER — Other Ambulatory Visit: Payer: Self-pay | Admitting: Physician Assistant

## 2020-05-20 ENCOUNTER — Other Ambulatory Visit: Payer: Self-pay | Admitting: Cardiology

## 2020-05-25 ENCOUNTER — Other Ambulatory Visit: Payer: Self-pay

## 2020-05-27 ENCOUNTER — Ambulatory Visit: Payer: Medicare Other | Admitting: Endocrinology

## 2020-05-27 ENCOUNTER — Other Ambulatory Visit: Payer: Self-pay

## 2020-05-27 VITALS — BP 180/100 | HR 84 | Ht 67.0 in | Wt 224.4 lb

## 2020-05-27 DIAGNOSIS — E890 Postprocedural hypothyroidism: Secondary | ICD-10-CM | POA: Diagnosis not present

## 2020-05-27 DIAGNOSIS — E559 Vitamin D deficiency, unspecified: Secondary | ICD-10-CM | POA: Diagnosis not present

## 2020-05-27 LAB — T4, FREE: Free T4: 0.8 ng/dL (ref 0.60–1.60)

## 2020-05-27 LAB — VITAMIN D 25 HYDROXY (VIT D DEFICIENCY, FRACTURES): VITD: 21.66 ng/mL — ABNORMAL LOW (ref 30.00–100.00)

## 2020-05-27 LAB — TSH: TSH: 7.88 u[IU]/mL — ABNORMAL HIGH (ref 0.35–4.50)

## 2020-05-27 MED ORDER — LEVOTHYROXINE SODIUM 150 MCG PO TABS: 150.0000 ug | ORAL_TABLET | Freq: Every day | ORAL | 3 refills | Status: DC

## 2020-05-27 MED ORDER — ERGOCALCIFEROL 50 MCG (2000 UT) PO CAPS: 4000.0000 [IU] | ORAL_CAPSULE | Freq: Every day | ORAL | 3 refills | Status: DC

## 2020-05-27 NOTE — Progress Notes (Signed)
Subjective:    Patient ID: Kari Coleman, female    DOB: 1950-04-13, 70 y.o.   MRN: [id number]  HPI Pt returns for f/u of post-RAI hypothyroidism and MNG (dx'ed 2013; she was initially rx'ed tapazole; [place] (2015) showed MNG; NM scan (2016) showed MNG with normal uptake (24%); Cytology (2016) showed BETHESDA CATEGORY 2 and 3; she took RAI rx 4/20; she started synthroid later in 2020; f/u US in 2021 advised no f/u US needed).  pt states she feels well in general.  She says she seldom misses synthroid. She takes Vit-D, 4000 units/day, as rx'ed.   Past Medical History:  Diagnosis Date  . Allergy   . Arthritis   . Cataract   . Cervical cancer ([place])   . GERD (gastroesophageal reflux disease)   . Glaucoma 2017  . History of echocardiogram    Echo 9/16:  Mild LVH, EF 55-60%, no RWMA, Gr 1 DD, mild MR // Echo 1/17:  Mild LVH, EF 50-55%, Gr 1 DD, mild to mod MR, mild LAE  . Hypertension   . Hyperthyroidism    s/p RAI treatment  . Palpitations   . PONV (postoperative nausea and vomiting)    years ago  . Woke up and saw asian people   . 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 Tobacco abuse   . Tuberculosis    positive test as a caregiver cxr annually   . Urinary incontinence     Past Surgical History:  Procedure Laterality Date  . ABDOMINAL HYSTERECTOMY  1984  . bladder surgey for incontinence    . EYE SURGERY    . REPAIR OF COMPLEX TRACTION RETINAL DETACHMENT    . thyroid radiation    . TOTAL ABDOMINAL HYSTERECTOMY W/ BILATERAL SALPINGOOPHORECTOMY    . TOTAL KNEE ARTHROPLASTY Right 04/22/2019   Procedure: RIGHT TOTAL KNEE ARTHROPLASTY;  Surgeon: Melrose Nakayama, MD;  Location: [facility];  Service: Orthopedics;  Laterality: Right;  PENDING PRE-OP FOR SDDC APPROVAL    Social History   Socioeconomic History  . Marital status: Single    Spouse name: Not on file  . Number of children: 2  . Years of education: Not on file  . Highest education level: Not on file  Occupational  History  . Occupation: CNA    CommentPublic librarian  . Occupation: Retired  Tobacco Use  . Smoking status: Former Smoker    Packs/day: 0.50    Years: 48.00    Pack years: 24.00    Types: Cigarettes    Start date: 11/30/1968    Quit date: 10/31/2016    Years since quitting: 3.5  . Smokeless tobacco: Never Used  . Tobacco comment: did  not smoke more than .5 pack   Vaping Use  . Vaping Use: Never used  Substance and Sexual Activity  . Alcohol use: Not Currently    Alcohol/week: 0.0 standard drinks    Comment: rarely once a year  . Drug use: No  . Sexual activity: Not Currently  Other Topics Concern  . Not on file  Social History Narrative   Caregiver   Daughter and grandson in household   Social Determinants of Health   Financial Resource Strain: Low Risk   . Difficulty of Paying Living Expenses: Not hard at all  Food Insecurity: No Food Insecurity  . Worried About Charity fundraiser in the Last Year: Never true  . Ran Out of Food in the Last Year: Never  true  Transportation Needs: No Transportation Needs  . Lack of Transportation (Medical): No  . Lack of Transportation (Non-Medical): No  Physical Activity: Insufficiently Active  . Days of Exercise per Week: 3 days  . Minutes of Exercise per Session: 10 min  Stress: No Stress Concern Present  . Feeling of Stress : Only a little  Social Connections: Moderately Isolated  . Frequency of Communication with Friends and Family: More than three times a week  . Frequency of Social Gatherings with Friends and Family: Twice a week  . Attends Religious Services: 1 to 4 times per year  . Active Member of Clubs or Organizations: No  . Attends Archivist Meetings: Never  . Marital Status: Never married  Intimate Partner Violence: Not At Risk  . Fear of Current or Ex-Partner: No  . Emotionally Abused: No  . Physically Abused: No  . Sexually Abused: No    Current Outpatient Medications on File Prior to Visit   Medication Sig Dispense Refill  . acetaminophen (TYLENOL) 325 MG tablet Take 650 mg by mouth every 4 (four) hours as needed.    Marland Kitchen albuterol (VENTOLIN HFA) 108 (90 Base) MCG/ACT inhaler INHALE 2 PUFFS INTO LUNGS EVERY 6 HOURS AS NEEDED FOR WHEEZING OR SHORTNESS OF BREATH 18 g 0  . amLODipine (NORVASC) 10 MG tablet Take 1 tablet by mouth once daily 90 tablet 0  . aspirin EC 81 MG tablet Take 1 tablet (81 mg total) by mouth 2 (two) times daily after a meal. (Patient not taking: Reported on 01/30/2020) 30 tablet 0  . brimonidine (ALPHAGAN) 0.2 % ophthalmic solution Place 1 drop into the right eye 2 (two) times daily. (Patient not taking: Reported on 01/22/2020)    . latanoprost (XALATAN) 0.005 % ophthalmic solution Place 1 drop into both eyes at bedtime. As directed  (Patient not taking: Reported on 01/30/2020)    . lisinopril-hydrochlorothiazide (ZESTORETIC) 20-25 MG tablet Take 1 tablet by mouth once daily 90 tablet 0  . metoprolol succinate (TOPROL-XL) 100 MG 24 hr tablet Take 1 tablet (100 mg total) by mouth daily. Take with or immediately following a meal. (Patient not taking: Reported on 01/30/2020) 90 tablet 1  . pantoprazole (PROTONIX) 40 MG tablet Take 1 tablet by mouth once daily 30 tablet 0   No current facility-administered medications on file prior to visit.    Allergies  Allergen Reactions  . Doxycycline Nausea Only  . Codeine Nausea And Vomiting  . Pollen Extract     Family History  Problem Relation Age of Onset  . Hypertension Mother   . Heart failure Mother   . Diabetes Mother   . Pancreatic cancer Father   . Colon cancer Maternal Aunt   . Cancer Sister   . Thyroid disease Paternal Aunt   . Cancer Paternal Aunt   . Thyroid disease Other        paternal side  . Cancer Maternal Grandfather   . Diabetes Paternal Grandmother   . Diabetes Maternal Uncle   . Colon polyps Neg Hx   . Stomach cancer Neg Hx     BP (!) 180/100 (BP Location: Right Arm, Patient Position:  Sitting, Cuff Size: Large)   Pulse 84   Ht 5\' 7"  (1.702 m)   Wt 224 lb 6.4 oz (101.8 kg)   SpO2 97%   BMI 35.15 kg/m    Review of Systems     Objective:   Physical Exam VITAL SIGNS:  See vs page GENERAL: no  distress NECK: There is no palpable thyroid enlargement.  No thyroid nodule is palpable.  No palpable lymphadenopathy at the anterior neck.  Lab Results  Component Value Date   TSH 7.88 (H) 05/27/2020   25-OH Vit-D=22    Assessment & Plan:  HTN: is noted today Hypothyroidism: I have sent a prescription to your pharmacy, to increase synthroid.  VitiD def: she needs increased rx.  I advised pt to increase Vit-D to 5000 units.   Patient Instructions  Your blood pressure is high today.  Please see your primary care provider soon, to have it rechecked.   Blood tests are requested for you today.  We'll let you know about the results.  It is best to never miss the levothyroxine.  However, if you do miss it, next best is to double up the next time.   Please come back for a follow-up appointment in 3 months.

## 2020-05-27 NOTE — Patient Instructions (Addendum)
Your blood pressure is high today.  Please see your primary care provider soon, to have it rechecked.   Blood tests are requested for you today.  We'll let you know about the results.  It is best to never miss the levothyroxine.  However, if you do miss it, next best is to double up the next time.   Please come back for a follow-up appointment in 3 months.

## 2020-06-03 ENCOUNTER — Other Ambulatory Visit: Payer: Self-pay | Admitting: Physician Assistant

## 2020-06-16 ENCOUNTER — Telehealth: Payer: Self-pay | Admitting: *Deleted

## 2020-06-16 NOTE — Telephone Encounter (Signed)
Tried to contact pt voicemail box is full unable to leave message will try again later.

## 2020-06-17 MED ORDER — ALBUTEROL SULFATE HFA 108 (90 BASE) MCG/ACT IN AERS: 2.0000 | INHALATION_SPRAY | Freq: Four times a day (QID) | RESPIRATORY_TRACT | 2 refills | Status: DC | PRN

## 2020-06-17 NOTE — Telephone Encounter (Signed)
Tried to call pt again mailbox is still full.

## 2020-08-26 ENCOUNTER — Ambulatory Visit: Payer: Medicare Other | Admitting: Endocrinology

## 2020-08-26 ENCOUNTER — Other Ambulatory Visit: Payer: Self-pay

## 2020-08-26 VITALS — BP 170/90 | HR 81 | Ht 67.0 in | Wt 226.0 lb

## 2020-08-26 DIAGNOSIS — E559 Vitamin D deficiency, unspecified: Secondary | ICD-10-CM | POA: Diagnosis not present

## 2020-08-26 DIAGNOSIS — E042 Nontoxic multinodular goiter: Secondary | ICD-10-CM | POA: Diagnosis not present

## 2020-08-26 DIAGNOSIS — E890 Postprocedural hypothyroidism: Secondary | ICD-10-CM

## 2020-08-26 LAB — TSH: TSH: 10.12 u[IU]/mL — ABNORMAL HIGH (ref 0.35–4.50)

## 2020-08-26 LAB — VITAMIN D 25 HYDROXY (VIT D DEFICIENCY, FRACTURES): VITD: 20.39 ng/mL — ABNORMAL LOW (ref 30.00–100.00)

## 2020-08-26 LAB — T4, FREE: Free T4: 0.52 ng/dL — ABNORMAL LOW (ref 0.60–1.60)

## 2020-08-26 MED ORDER — LEVOTHYROXINE SODIUM 175 MCG PO TABS: 175.0000 ug | ORAL_TABLET | Freq: Every day | ORAL | 3 refills | Status: DC

## 2020-08-26 NOTE — Progress Notes (Signed)
Subjective:    Patient ID: Kari Coleman, female    DOB: Oct 30, 1950, 70 y.o.   MRN: [id number]  HPI Pt returns for f/u of post-RAI hypothyroidism and MNG (dx'ed 2013; she was initially rx'ed tapazole; [place] (2015) showed MNG; NM scan (2016) showed MNG with normal uptake (24%); Cytology (2016) showed BETHESDA CATEGORY 2 and 3; she took RAI rx 4/20; she started synthroid later in 2020; f/u US in 2021 advised no f/u US needed).  Since synthroid was increased, pt states she feels no different, and well in general.  She says she seldom misses synthroid.  She takes Vit-D, just 4000 units/day.   Past Medical History:  Diagnosis Date  . Allergy   . Arthritis   . Cataract   . Cervical cancer ([employer] [facility] [place])   . GERD (gastroesophageal reflux disease)   . Glaucoma 2017  . History of echocardiogram    Echo 9/16:  Mild LVH, EF 55-60%, no RWMA, Gr 1 DD, mild MR // Echo 1/17:  Mild LVH, EF 50-55%, Gr 1 DD, mild to mod MR, mild LAE  . Hypertension   . Hyperthyroidism    s/p RAI treatment  . Palpitations   . PONV (postoperative nausea and vomiting)    years ago  . Woke up and saw asian people   . 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 Tobacco abuse   . Tuberculosis    positive test as a caregiver cxr annually   . Urinary incontinence     Past Surgical History:  Procedure Laterality Date  . ABDOMINAL HYSTERECTOMY  1984  . bladder surgey for incontinence    . EYE SURGERY    . REPAIR OF COMPLEX TRACTION RETINAL DETACHMENT    . thyroid radiation    . TOTAL ABDOMINAL HYSTERECTOMY W/ BILATERAL SALPINGOOPHORECTOMY    . TOTAL KNEE ARTHROPLASTY Right 04/22/2019   Procedure: RIGHT TOTAL KNEE ARTHROPLASTY;  Surgeon: Melrose Nakayama, MD;  Location: [facility];  Service: Orthopedics;  Laterality: Right;  PENDING PRE-OP FOR SDDC APPROVAL    Social History   Socioeconomic History  . Marital status: Single    Spouse name: Not on file  . Number of children: 2  . Years of education: Not on file  . Highest  education level: Not on file  Occupational History  . Occupation: CNA    CommentPublic librarian  . Occupation: Retired  Tobacco Use  . Smoking status: Former Smoker    Packs/day: 0.50    Years: 48.00    Pack years: 24.00    Types: Cigarettes    Start date: 11/30/1968    Quit date: 10/31/2016    Years since quitting: 3.8  . Smokeless tobacco: Never Used  . Tobacco comment: did  not smoke more than .5 pack   Vaping Use  . Vaping Use: Never used  Substance and Sexual Activity  . Alcohol use: Not Currently    Alcohol/week: 0.0 standard drinks    Comment: rarely once a year  . Drug use: No  . Sexual activity: Not Currently  Other Topics Concern  . Not on file  Social History Narrative   Caregiver   Daughter and grandson in household   Social Determinants of Health   Financial Resource Strain: Low Risk   . Difficulty of Paying Living Expenses: Not hard at all  Food Insecurity: No Food Insecurity  . Worried About Charity fundraiser in the Last Year: Never true  . Ran Out  of Food in the Last Year: Never true  Transportation Needs: No Transportation Needs  . Lack of Transportation (Medical): No  . Lack of Transportation (Non-Medical): No  Physical Activity: Insufficiently Active  . Days of Exercise per Week: 3 days  . Minutes of Exercise per Session: 10 min  Stress: No Stress Concern Present  . Feeling of Stress : Only a little  Social Connections: Moderately Isolated  . Frequency of Communication with Friends and Family: More than three times a week  . Frequency of Social Gatherings with Friends and Family: Twice a week  . Attends Religious Services: 1 to 4 times per year  . Active Member of Clubs or Organizations: No  . Attends Archivist Meetings: Never  . Marital Status: Never married  Intimate Partner Violence: Not At Risk  . Fear of Current or Ex-Partner: No  . Emotionally Abused: No  . Physically Abused: No  . Sexually Abused: No    Current  Outpatient Medications on File Prior to Visit  Medication Sig Dispense Refill  . acetaminophen (TYLENOL) 325 MG tablet Take 650 mg by mouth every 4 (four) hours as needed.    Marland Kitchen albuterol (PROVENTIL HFA) 108 (90 Base) MCG/ACT inhaler Inhale 2 puffs into the lungs every 6 (six) hours as needed for wheezing or shortness of breath. 18 g 2  . albuterol (VENTOLIN HFA) 108 (90 Base) MCG/ACT inhaler INHALE 2 PUFFS INTO LUNGS EVERY 6 HOURS AS NEEDED FOR WHEEZING FOR SHORTNESS OF BREATH 18 g 0  . amLODipine (NORVASC) 10 MG tablet Take 1 tablet by mouth once daily 90 tablet 0  . aspirin EC 81 MG tablet Take 1 tablet (81 mg total) by mouth 2 (two) times daily after a meal. 30 tablet 0  . brimonidine (ALPHAGAN) 0.2 % ophthalmic solution Place 1 drop into the right eye 2 (two) times daily.    Marland Kitchen latanoprost (XALATAN) 0.005 % ophthalmic solution Place 1 drop into both eyes at bedtime. As directed    . lisinopril-hydrochlorothiazide (ZESTORETIC) 20-25 MG tablet Take 1 tablet by mouth once daily 90 tablet 0  . metoprolol succinate (TOPROL-XL) 100 MG 24 hr tablet Take 1 tablet (100 mg total) by mouth daily. Take with or immediately following a meal. 90 tablet 1  . pantoprazole (PROTONIX) 40 MG tablet Take 1 tablet by mouth once daily 30 tablet 0   No current facility-administered medications on file prior to visit.    Allergies  Allergen Reactions  . Doxycycline Nausea Only  . Codeine Nausea And Vomiting  . Pollen Extract     Family History  Problem Relation Age of Onset  . Hypertension Mother   . Heart failure Mother   . Diabetes Mother   . Pancreatic cancer Father   . Colon cancer Maternal Aunt   . Cancer Sister   . Thyroid disease Paternal Aunt   . Cancer Paternal Aunt   . Thyroid disease Other        paternal side  . Cancer Maternal Grandfather   . Diabetes Paternal Grandmother   . Diabetes Maternal Uncle   . Colon polyps Neg Hx   . Stomach cancer Neg Hx     BP (!) 170/90 (BP Location:  Right Arm, Patient Position: Sitting, Cuff Size: Large)   Pulse 81   Ht 5\' 7"  (1.702 m)   Wt 226 lb (102.5 kg)   SpO2 96%   BMI 35.40 kg/m    Review of Systems Denies muscle cramps.  Objective:   Physical Exam VITAL SIGNS:  See vs page GENERAL: no distress NECK: There is no palpable thyroid enlargement.  No thyroid nodule is palpable.  No palpable lymphadenopathy at the anterior neck.     Lab Results  Component Value Date   TSH 10.12 (H) 08/26/2020   25-OH Vit-D=25    Assessment & Plan:  Hypothyroidism: uncontrolled.  I have sent a prescription to your pharmacy, to increase synthroid.   Vit-D def: uncontrolled.  Increase Vit-D to 5000/d

## 2020-08-26 NOTE — Patient Instructions (Addendum)
Your blood pressure is high today.  Please see your primary care provider soon, to have it rechecked.   Blood tests are requested for you today.  We'll let you know about the results.   It is best to never miss the levothyroxine.  However, if you do miss it, next best is to double up the next time.   Please come back for a follow-up appointment in 4 months.

## 2020-09-15 LAB — HEMOGLOBIN A1C: Hemoglobin A1C: 5.4

## 2020-10-07 ENCOUNTER — Other Ambulatory Visit: Payer: Self-pay | Admitting: Cardiology

## 2020-10-08 ENCOUNTER — Encounter: Payer: Self-pay | Admitting: Physician Assistant

## 2020-10-24 ENCOUNTER — Other Ambulatory Visit: Payer: Self-pay | Admitting: Physician Assistant

## 2020-11-22 ENCOUNTER — Ambulatory Visit (INDEPENDENT_AMBULATORY_CARE_PROVIDER_SITE_OTHER): Payer: Medicare Other

## 2020-11-22 DIAGNOSIS — Z1231 Encounter for screening mammogram for malignant neoplasm of breast: Secondary | ICD-10-CM | POA: Diagnosis not present

## 2020-11-22 DIAGNOSIS — Z0000 Encounter for general adult medical examination without abnormal findings: Secondary | ICD-10-CM | POA: Diagnosis not present

## 2020-11-22 NOTE — Patient Instructions (Signed)
Kari Coleman , Thank you for taking time to come for your Medicare Wellness Visit. I appreciate your ongoing commitment to your health goals. Please review the following plan we discussed and let me know if I can assist you in the future.   Screening recommendations/referrals: Colonoscopy: done 01/30/20 repeat every 10 years due 01/29/30 Mammogram: Done 07/03/17 repeat every year  Bone Density: Done 07/03/17 repaet every 2 years  Recommended yearly ophthalmology/optometry visit for glaucoma screening and checkup Recommended yearly dental visit for hygiene and checkup  Vaccinations: Influenza vaccine: due  Pneumococcal vaccine: Due  Tdap vaccine: Due  Shingles vaccine: Shingrix discussed. Please contact your pharmacy for coverage information.    Covid-19:Completed 3/14 & 07/07/19  Advanced directives: Please bring a copy of your health care power of attorney and living will to the office at your convenience.  Conditions/risks identified: Lose weight   Next appointment: Follow up in one year for your annual wellness visit     Preventive Care 65 Years and Older, Female Preventive care refers to lifestyle choices and visits with your health care provider that can promote health and wellness. What does preventive care include? A yearly physical exam. This is also called an annual well check. Dental exams once or twice a year. Routine eye exams. Ask your health care provider how often you should have your eyes checked. Personal lifestyle choices, including: Daily care of your teeth and gums. Regular physical activity. Eating a healthy diet. Avoiding tobacco and drug use. Limiting alcohol use. Practicing safe sex. Taking low-dose aspirin every day. Taking vitamin and mineral supplements as recommended by your health care provider. What happens during an annual well check? The services and screenings done by your health care provider during your annual well check will depend on your age,  overall health, lifestyle risk factors, and family history of disease. Counseling  Your health care provider may ask you questions about your: Alcohol use. Tobacco use. Drug use. Emotional well-being. Home and relationship well-being. Sexual activity. Eating habits. History of falls. Memory and ability to understand (cognition). Work and work Statistician. Reproductive health. Screening  You may have the following tests or measurements: Height, weight, and BMI. Blood pressure. Lipid and cholesterol levels. These may be checked every 5 years, or more frequently if you are over 35 years old. Skin check. Lung cancer screening. You may have this screening every year starting at age 72 if you have a 30-pack-year history of smoking and currently smoke or have quit within the past 15 years. Fecal occult blood test (FOBT) of the stool. You may have this test every year starting at age 72. Flexible sigmoidoscopy or colonoscopy. You may have a sigmoidoscopy every 5 years or a colonoscopy every 10 years starting at age 72. Hepatitis C blood test. Hepatitis B blood test. Sexually transmitted disease (STD) testing. Diabetes screening. This is done by checking your blood sugar (glucose) after you have not eaten for a while (fasting). You may have this done every 1-3 years. Bone density scan. This is done to screen for osteoporosis. You may have this done starting at age 72. Mammogram. This may be done every 1-2 years. Talk to your health care provider about how often you should have regular mammograms. Talk with your health care provider about your test results, treatment options, and if necessary, the need for more tests. Vaccines  Your health care provider may recommend certain vaccines, such as: Influenza vaccine. This is recommended every year. Tetanus, diphtheria, and acellular pertussis (Tdap,  Td) vaccine. You may need a Td booster every 10 years. Zoster vaccine. You may need this after age  72. Pneumococcal 13-valent conjugate (PCV13) vaccine. One dose is recommended after age 72. Pneumococcal polysaccharide (PPSV23) vaccine. One dose is recommended after age 72. Talk to your health care provider about which screenings and vaccines you need and how often you need them. This information is not intended to replace advice given to you by your health care provider. Make sure you discuss any questions you have with your health care provider. Document Released: 04/16/2015 Document Revised: 12/08/2015 Document Reviewed: 01/19/2015 Elsevier Interactive Patient Education  2017 [place] Prevention in the Home Falls can cause injuries. They can happen to people of all ages. There are many things you can do to make your home safe and to help prevent falls. What can I do on the outside of my home? Regularly fix the edges of walkways and driveways and fix any cracks. Remove anything that might make you trip as you walk through a door, such as a raised step or threshold. Trim any bushes or trees on the path to your home. Use bright outdoor lighting. Clear any walking paths of anything that might make someone trip, such as rocks or tools. Regularly check to see if handrails are loose or broken. Make sure that both sides of any steps have handrails. Any raised decks and porches should have guardrails on the edges. Have any leaves, snow, or ice cleared regularly. Use sand or salt on walking paths during winter. Clean up any spills in your garage right away. This includes oil or grease spills. What can I do in the bathroom? Use night lights. Install grab bars by the toilet and in the tub and shower. Do not use towel bars as grab bars. Use non-skid mats or decals in the tub or shower. If you need to sit down in the shower, use a plastic, non-slip stool. Keep the floor dry. Clean up any water that spills on the floor as soon as it happens. Remove soap buildup in the tub or shower  regularly. Attach bath mats securely with double-sided non-slip rug tape. Do not have throw rugs and other things on the floor that can make you trip. What can I do in the bedroom? Use night lights. Make sure that you have a light by your bed that is easy to reach. Do not use any sheets or blankets that are too big for your bed. They should not hang down onto the floor. Have a firm chair that has side arms. You can use this for support while you get dressed. Do not have throw rugs and other things on the floor that can make you trip. What can I do in the kitchen? Clean up any spills right away. Avoid walking on wet floors. Keep items that you use a lot in easy-to-reach places. If you need to reach something above you, use a strong step stool that has a grab bar. Keep electrical cords out of the way. Do not use floor polish or wax that makes floors slippery. If you must use wax, use non-skid floor wax. Do not have throw rugs and other things on the floor that can make you trip. What can I do with my stairs? Do not leave any items on the stairs. Make sure that there are handrails on both sides of the stairs and use them. Fix handrails that are broken or loose. Make sure that handrails are as  long as the stairways. Check any carpeting to make sure that it is firmly attached to the stairs. Fix any carpet that is loose or worn. Avoid having throw rugs at the top or bottom of the stairs. If you do have throw rugs, attach them to the floor with carpet tape. Make sure that you have a light switch at the top of the stairs and the bottom of the stairs. If you do not have them, ask someone to add them for you. What else can I do to help prevent falls? Wear shoes that: Do not have high heels. Have rubber bottoms. Are comfortable and fit you well. Are closed at the toe. Do not wear sandals. If you use a stepladder: Make sure that it is fully opened. Do not climb a closed stepladder. Make sure that  both sides of the stepladder are locked into place. Ask someone to hold it for you, if possible. Clearly mark and make sure that you can see: Any grab bars or handrails. First and last steps. Where the edge of each step is. Use tools that help you move around (mobility aids) if they are needed. These include: Canes. Walkers. Scooters. Crutches. Turn on the lights when you go into a dark area. Replace any light bulbs as soon as they burn out. Set up your furniture so you have a clear path. Avoid moving your furniture around. If any of your floors are uneven, fix them. If there are any pets around you, be aware of where they are. Review your medicines with your doctor. Some medicines can make you feel dizzy. This can increase your chance of falling. Ask your doctor what other things that you can do to help prevent falls. This information is not intended to replace advice given to you by your health care provider. Make sure you discuss any questions you have with your health care provider. Document Released: 01/14/2009 Document Revised: 08/26/2015 Document Reviewed: 04/24/2014 Elsevier Interactive Patient Education  2017 [employer].

## 2020-11-22 NOTE — Progress Notes (Addendum)
Virtual Visit via Telephone Note  I connected with  Kari Coleman on 11/22/20 at 11:45 AM EDT by telephone and verified that I am speaking with the correct person using two identifiers.  Medicare Annual Wellness visit completed telephonically due to Covid-19 pandemic.   Persons participating in this call: This Health Coach and this patient.   Location: Patient: Home Provider: Office   I discussed the limitations, risks, security and privacy concerns of performing an evaluation and management service by telephone and the availability of in person appointments. The patient expressed understanding and agreed to proceed.  Unable to perform video visit due to video visit attempted and failed and/or patient does not have video capability.   Some vital signs may be absent or patient reported.   Willette Brace, LPN   Subjective:   Kari Coleman is a 70 y.o. female who presents for Medicare Annual (Subsequent) preventive examination.  Review of Systems     Cardiac Risk Factors include: hypertension;obesity (BMI >30kg/m2);advanced age (>110mn, >>33women)     Objective:    There were no vitals filed for this visit. There is no height or weight on file to calculate BMI.  Advanced Directives 11/22/2020 11/17/2019 07/31/2019 04/22/2019 04/18/2019 11/13/2018  Does Patient Have a Medical Advance Directive? Yes No No No No No  Does patient want to make changes to medical advance directive? Yes (MAU/Ambulatory/Procedural Areas - Information given) - - - - -  Would patient like information on creating a medical advance directive? - Yes (MAU/Ambulatory/Procedural Areas - Information given) - No - Patient declined No - Patient declined Yes (MAU/Ambulatory/Procedural Areas - Information given)    Current Medications (verified) Outpatient Encounter Medications as of 11/22/2020  Medication Sig   acetaminophen (TYLENOL) 325 MG tablet Take 650 mg by mouth every 4 (four) hours as needed.   albuterol  (PROVENTIL HFA) 108 (90 Base) MCG/ACT inhaler Inhale 2 puffs into the lungs every 6 (six) hours as needed for wheezing or shortness of breath.   albuterol (VENTOLIN HFA) 108 (90 Base) MCG/ACT inhaler INHALE 2 PUFFS INTO LUNGS EVERY 6 HOURS AS NEEDED FOR WHEEZING FOR SHORTNESS OF BREATH   amLODipine (NORVASC) 10 MG tablet Take 1 tablet by mouth once daily   aspirin EC 81 MG tablet Take 1 tablet (81 mg total) by mouth 2 (two) times daily after a meal.   brimonidine (ALPHAGAN) 0.2 % ophthalmic solution Place 1 drop into the right eye 2 (two) times daily.   dorzolamide-timolol (COSOPT) 22.3-6.8 MG/ML ophthalmic solution Place 1 drop into the right eye 2 (two) times daily.   latanoprost (XALATAN) 0.005 % ophthalmic solution Place 1 drop into both eyes at bedtime. As directed   levothyroxine (SYNTHROID) 175 MCG tablet Take 1 tablet (175 mcg total) by mouth daily.   lisinopril-hydrochlorothiazide (ZESTORETIC) 20-25 MG tablet Take 1 tablet by mouth once daily   metoprolol succinate (TOPROL-XL) 100 MG 24 hr tablet Take 1 tablet (100 mg total) by mouth daily. Take with or immediately following a meal.   pantoprazole (PROTONIX) 40 MG tablet Take 1 tablet by mouth once daily   No facility-administered encounter medications on file as of 11/22/2020.    Allergies (verified) Doxycycline, Codeine, and Pollen extract   History: Past Medical History:  Diagnosis Date   Allergy    Arthritis    Cataract    Cervical cancer ([place]    GERD (gastroesophageal reflux disease)    Glaucoma 2017   History of echocardiogram    Echo  9/16:  Mild LVH, EF 55-60%, no RWMA, Gr 1 DD, mild MR // Echo 1/17:  Mild LVH, EF 50-55%, Gr 1 DD, mild to mod MR, mild LAE   Hypertension    Hyperthyroidism    s/p RAI treatment   Palpitations    PONV (postoperative nausea and vomiting)    years ago  . Woke up and saw asian people    Retinal detachment    L eye - partial blindness   Tobacco abuse    Tuberculosis    positive test as  a caregiver cxr annually    Urinary incontinence    Past Surgical History:  Procedure Laterality Date   ABDOMINAL HYSTERECTOMY  1984   bladder surgey for incontinence     EYE SURGERY     REPAIR OF COMPLEX TRACTION RETINAL DETACHMENT     thyroid radiation     TOTAL ABDOMINAL HYSTERECTOMY W/ BILATERAL SALPINGOOPHORECTOMY     TOTAL KNEE ARTHROPLASTY Right 04/22/2019   Procedure: RIGHT TOTAL KNEE ARTHROPLASTY;  Surgeon: Melrose Nakayama, MD;  Location: [facility];  Service: Orthopedics;  Laterality: Right;  PENDING PRE-OP FOR SDDC APPROVAL   Family History  Problem Relation Age of Onset   Hypertension Mother    Heart failure Mother    Diabetes Mother    Pancreatic cancer Father    Colon cancer Maternal Aunt    Cancer Sister    Thyroid disease Paternal Aunt    Cancer Paternal Aunt    Thyroid disease Other        paternal side   Cancer Maternal Grandfather    Diabetes Paternal Grandmother    Diabetes Maternal Uncle    Colon polyps Neg Hx    Stomach cancer Neg Hx    Social History   Socioeconomic History   Marital status: Single    Spouse name: Not on file   Number of children: 2   Years of education: Not on file   Highest education level: Not on file  Occupational History   Occupation: CNA    Comment: [employer]   Occupation: Retired  Tobacco Use   Smoking status: Former    Packs/day: 0.50    Years: 48.00    Pack years: 24.00    Types: Cigarettes    Start date: 11/30/1968    Quit date: 10/31/2016    Years since quitting: 4.0   Smokeless tobacco: Never   Tobacco comments:    did  not smoke more than .5 pack   Vaping Use   Vaping Use: Never used  Substance and Sexual Activity   Alcohol use: Not Currently    Alcohol/week: 0.0 standard drinks    Comment: rarely once a year   Drug use: No   Sexual activity: Not Currently  Other Topics Concern   Not on file  Social History Narrative   Caregiver   Daughter and grandson in household   Social Determinants of  Health   Financial Resource Strain: Low Risk    Difficulty of Paying Living Expenses: Not hard at all  Food Insecurity: No Food Insecurity   Worried About Charity fundraiser in the Last Year: Never true   Arboriculturist in the Last Year: Never true  Transportation Needs: No Transportation Needs   Lack of Transportation (Medical): No   Lack of Transportation (Non-Medical): No  Physical Activity: Inactive   Days of Exercise per Week: 0 days   Minutes of Exercise per Session: 0 min  Stress: No  Stress Concern Present   Feeling of Stress : Only a little  Social Connections: Unknown   Frequency of Communication with Friends and Family: More than three times a week   Frequency of Social Gatherings with Friends and Family: Twice a week   Attends Religious Services: 1 to 4 times per year   Active Member of [employer] or Organizations: No   Attends Archivist Meetings: Never   Marital Status: Not on file    Tobacco Counseling Counseling given: Not Answered Tobacco comments: did  not smoke more than .5 pack    Clinical Intake:  Pre-visit preparation completed: Yes  Pain : No/denies pain     BMI - recorded: 35.4 Nutritional Status: BMI > 30  Obese Nutritional Risks: None Diabetes: No  How often do you need to have someone help you when you read instructions, pamphlets, or other written materials from your doctor or pharmacy?: 1 - Never  Diabetic?No  Interpreter Needed?: No  Information entered by :: Dayton Martes   Activities of Daily Living In your present state of health, do you have any difficulty performing the following activities: 11/22/2020  Hearing? N  Vision? N  Difficulty concentrating or making decisions? Y  Comment at times memory  Walking or climbing stairs? N  Comment don't climb up stairs to much  Dressing or bathing? N  Doing errands, shopping? N  Preparing Food and eating ? N  Using the Toilet? N  In the past six months, have you  accidently leaked urine? Y  Comment wears a pad  Do you have problems with loss of bowel control? N  Managing your Medications? N  Managing your Finances? N  Housekeeping or managing your Housekeeping? N  Some recent data might be hidden    Patient Care Team: Inda Coke, [place] as PCP - General (Physician Assistant) Gerda Diss, DO as Consulting Physician (Sports Medicine) Richardo Priest, MD as Consulting Physician (Cardiology)  Indicate any recent Medical Services you may have received from other than [facility] providers in the past year (date may be approximate).     Assessment:   This is a routine wellness examination for Kari Coleman.  Hearing/Vision screen Hearing Screening - Comments:: Pt denies any hearing issues  Vision Screening - Comments:: Pt follows up with Dr Delman Cheadle for annual eye exams   Dietary issues and exercise activities discussed: Current Exercise Habits: The patient does not participate in regular exercise at present   Goals Addressed             This Visit's Progress    Patient Stated       None at this time        Depression Screen PHQ 2/9 Scores 11/22/2020 11/17/2019 11/13/2018 11/08/2017 06/07/2017 06/07/2017 01/31/2017  PHQ - 2 Score 0 0 0 1 0 0 0    Fall Risk Fall Risk  11/22/2020 11/17/2019 11/13/2018 11/08/2017 06/07/2017  Falls in the past year? 0 0 0 No Yes  Number falls in past yr: 0 0 0 - 2 or more  Injury with Fall? 0 0 0 - Yes  Risk for fall due to : Impaired vision;Impaired balance/gait Impaired balance/gait;Impaired mobility;Impaired vision;Orthopedic patient Impaired mobility - Other (Comment)  Risk for fall due to: Comment - hx of knee replacment - - -  Follow up Falls prevention discussed Falls prevention discussed Education provided - -    FALL RISK PREVENTION PERTAINING TO THE HOME:  Any stairs in or around the home? No  If so, are there any without handrails? No  Home free of loose throw rugs in walkways, pet beds, electrical cords, etc?  Yes  Adequate lighting in your home to reduce risk of falls? Yes   ASSISTIVE DEVICES UTILIZED TO PREVENT FALLS:  Life alert? No  Use of a cane, walker or w/c? Yes  Grab bars in the bathroom? Yes  Shower chair or bench in shower? Yes  Elevated toilet seat or a handicapped toilet? No   TIMED UP AND GO:  Was the test performed? No .   Cognitive Function: MMSE - Mini Mental State Exam 06/07/2017  Not completed: (No Data)     6CIT Screen 11/22/2020 11/17/2019  What Year? 0 points 0 points  What month? 0 points 0 points  What time? 0 points -  Count back from 20 0 points 0 points  Months in reverse 0 points 0 points  Repeat phrase 0 points 4 points  Total Score 0 -    Immunizations Immunization History  Administered Date(s) Administered   Fluad Quad(high Dose 65+) 04/09/2019   Influenza, High Dose Seasonal PF 06/10/2018   PFIZER(Purple Top)SARS-COV-2 Vaccination 06/15/2019, 07/07/2019   PPD Test 02/29/2016    TDAP status: Due, Education has been provided regarding the importance of this vaccine. Advised may receive this vaccine at local pharmacy or Health Dept. Aware to provide a copy of the vaccination record if obtained from local pharmacy or Health Dept. Verbalized acceptance and understanding.  Flu Vaccine status: Due, Education has been provided regarding the importance of this vaccine. Advised may receive this vaccine at local pharmacy or Health Dept. Aware to provide a copy of the vaccination record if obtained from local pharmacy or Health Dept. Verbalized acceptance and understanding.  Pneumococcal vaccine status: Due, Education has been provided regarding the importance of this vaccine. Advised may receive this vaccine at local pharmacy or Health Dept. Aware to provide a copy of the vaccination record if obtained from local pharmacy or Health Dept. Verbalized acceptance and understanding.  Covid-19 vaccine status: Completed vaccines  Qualifies for Shingles Vaccine? Yes    Zostavax completed No   Shingrix Completed?: No.    Education has been provided regarding the importance of this vaccine. Patient has been advised to call insurance company to determine out of pocket expense if they have not yet received this vaccine. Advised may also receive vaccine at local pharmacy or Health Dept. Verbalized acceptance and understanding.  Screening Tests Health Maintenance  Topic Date Due   TETANUS/TDAP  Never done   Zoster Vaccines- Shingrix (1 of 2) Never done   PNA vac Low Risk Adult (1 of 2 - PCV13) Never done   MAMMOGRAM  07/04/2019   COVID-19 Vaccine (3 - Booster for Pfizer series) 12/07/2019   INFLUENZA VACCINE  11/01/2020   COLONOSCOPY (Pts 45-52yr Insurance coverage will need to be confirmed)  01/29/2030   DEXA SCAN  Completed   Hepatitis C Screening  Completed   HPV VACCINES  Aged Out    Health Maintenance  Health Maintenance Due  Topic Date Due   TETANUS/TDAP  Never done   Zoster Vaccines- Shingrix (1 of 2) Never done   PNA vac Low Risk Adult (1 of 2 - PCV13) Never done   MAMMOGRAM  07/04/2019   COVID-19 Vaccine (3 - Booster for Pfizer series) 12/07/2019   INFLUENZA VACCINE  11/01/2020    Colorectal cancer screening: Type of screening: Colonoscopy. Completed 01/30/20. Repeat every 10 years  Mammogram status: Ordered 11/22/20.  Pt provided with contact info and advised to call to schedule appt.   Bone Density status: Completed 07/03/17. Results reflect: Bone density results: NORMAL. Repeat every 2 years.   Additional Screening:  Hepatitis C Screening:  Completed 11/08/17  Vision Screening: Recommended annual ophthalmology exams for early detection of glaucoma and other disorders of the eye. Is the patient up to date with their annual eye exam?  Yes  Who is the provider or what is the name of the office in which the patient attends annual eye exams? Dr Delman Cheadle If pt is not established with a provider, would they like to be referred to a provider to  establish care? No .   Dental Screening: Recommended annual dental exams for proper oral hygiene  Community Resource Referral / Chronic Care Management: CRR required this visit?  No   CCM required this visit?  No      Plan:     I have personally reviewed and noted the following in the patient's chart:   Medical and social history Use of alcohol, tobacco or illicit drugs  Current medications and supplements including opioid prescriptions.  Functional ability and status Nutritional status Physical activity Advanced directives List of other physicians Hospitalizations, surgeries, and ER visits in previous 12 months Vitals Screenings to include cognitive, depression, and falls Referrals and appointments  In addition, I have reviewed and discussed with patient certain preventive protocols, quality metrics, and best practice recommendations. A written personalized care plan for preventive services as well as general preventive health recommendations were provided to patient.     Willette Brace, LPN   [id number]   Nurse Notes: None

## 2020-12-09 ENCOUNTER — Other Ambulatory Visit: Payer: Self-pay | Admitting: Cardiology

## 2020-12-09 NOTE — Telephone Encounter (Signed)
Pantoprazole sodium 20 mg # 20 tablets only with message for patient to schedule appointment or contact primary care for further refills Sent to [facility] ([facility]) - [place], [place]

## 2020-12-16 ENCOUNTER — Telehealth: Payer: Self-pay

## 2020-12-16 MED ORDER — LISINOPRIL-HYDROCHLOROTHIAZIDE 20-25 MG PO TABS: 1.0000 | ORAL_TABLET | Freq: Every day | ORAL | 0 refills | Status: DC

## 2020-12-16 NOTE — Telephone Encounter (Signed)
Rx sent to pharmacy   

## 2020-12-16 NOTE — Telephone Encounter (Signed)
LAST APPOINTMENT DATE:  11/22/20  NEXT APPOINTMENT DATE: 12/12/21  MEDICATION:lisinopril-hydrochlorothiazide (ZESTORETIC) 20-25 MG tablet  PHARMACY:[facility] ([facility] - [place], [place]

## 2020-12-23 ENCOUNTER — Other Ambulatory Visit: Payer: Self-pay

## 2020-12-23 ENCOUNTER — Ambulatory Visit
Admission: RE | Admit: 2020-12-23 | Discharge: 2020-12-23 | Disposition: A | Discharge status: Home / Self Care | Payer: Medicare Other | Source: Ambulatory Visit | Attending: Physician Assistant | Admitting: Physician Assistant

## 2020-12-23 DIAGNOSIS — Z1231 Encounter for screening mammogram for malignant neoplasm of breast: Secondary | ICD-10-CM

## 2020-12-30 ENCOUNTER — Other Ambulatory Visit: Payer: Self-pay | Admitting: Physician Assistant

## 2020-12-30 ENCOUNTER — Ambulatory Visit: Payer: Medicare Other | Admitting: Endocrinology

## 2020-12-30 DIAGNOSIS — R928 Other abnormal and inconclusive findings on diagnostic imaging of breast: Secondary | ICD-10-CM

## 2021-01-06 ENCOUNTER — Other Ambulatory Visit: Payer: Self-pay | Admitting: Physician Assistant

## 2021-01-07 ENCOUNTER — Other Ambulatory Visit (HOSPITAL_COMMUNITY)
Admission: RE | Admit: 2021-01-07 | Discharge: 2021-01-07 | Disposition: A | Discharge status: Home / Self Care | Payer: Medicare Other | Source: Ambulatory Visit | Attending: Physician Assistant | Admitting: Physician Assistant

## 2021-01-07 ENCOUNTER — Encounter: Payer: Self-pay | Admitting: Physician Assistant

## 2021-01-07 ENCOUNTER — Ambulatory Visit (INDEPENDENT_AMBULATORY_CARE_PROVIDER_SITE_OTHER): Payer: Medicare Other | Admitting: Physician Assistant

## 2021-01-07 ENCOUNTER — Other Ambulatory Visit: Payer: Self-pay

## 2021-01-07 VITALS — BP 158/90 | HR 70 | Temp 98.2°F | Ht 67.0 in | Wt 222.0 lb

## 2021-01-07 DIAGNOSIS — R21 Rash and other nonspecific skin eruption: Secondary | ICD-10-CM | POA: Diagnosis not present

## 2021-01-07 DIAGNOSIS — N949 Unspecified condition associated with female genital organs and menstrual cycle: Secondary | ICD-10-CM

## 2021-01-07 DIAGNOSIS — Z23 Encounter for immunization: Secondary | ICD-10-CM | POA: Diagnosis not present

## 2021-01-07 DIAGNOSIS — I10 Essential (primary) hypertension: Secondary | ICD-10-CM

## 2021-01-07 LAB — COMPREHENSIVE METABOLIC PANEL
ALT: 20 U/L (ref 0–35)
AST: 19 U/L (ref 0–37)
Albumin: 4.3 g/dL (ref 3.5–5.2)
Alkaline Phosphatase: 79 U/L (ref 39–117)
BUN: 21 mg/dL (ref 6–23)
CO2: 29 mEq/L (ref 19–32)
Calcium: 10.2 mg/dL (ref 8.4–10.5)
Chloride: 102 mEq/L (ref 96–112)
Creatinine, Ser: 1.36 mg/dL — ABNORMAL HIGH (ref 0.40–1.20)
GFR: 38.97 mL/min — ABNORMAL LOW (ref >60.00)
Glucose, Bld: 89 mg/dL (ref 70–99)
Potassium: 4 mEq/L (ref 3.5–5.1)
Sodium: 138 mEq/L (ref 135–145)
Total Bilirubin: 0.5 mg/dL (ref 0.2–1.2)
Total Protein: 8.1 g/dL (ref 6.0–8.3)

## 2021-01-07 LAB — CBC WITH DIFFERENTIAL/PLATELET
Basophils Absolute: 0 10*3/uL (ref 0.0–0.1)
Basophils Relative: 0.8 % (ref 0.0–3.0)
Eosinophils Absolute: 0.2 10*3/uL (ref 0.0–0.7)
Eosinophils Relative: 3.4 % (ref 0.0–5.0)
HCT: 39.7 % (ref 36.0–46.0)
Hemoglobin: 13.4 g/dL (ref 12.0–15.0)
Lymphocytes Relative: 34.6 % (ref 12.0–46.0)
Lymphs Abs: 1.9 10*3/uL (ref 0.7–4.0)
MCHC: 33.7 g/dL (ref 30.0–36.0)
MCV: 89 fl (ref 78.0–100.0)
Monocytes Absolute: 0.3 10*3/uL (ref 0.1–1.0)
Monocytes Relative: 6 % (ref 3.0–12.0)
Neutro Abs: 3 10*3/uL (ref 1.4–7.7)
Neutrophils Relative %: 55.2 % (ref 43.0–77.0)
Platelets: 248 10*3/uL (ref 150.0–400.0)
RBC: 4.46 Mil/uL (ref 3.87–5.11)
RDW: 12.9 % (ref 11.5–15.5)
WBC: 5.4 10*3/uL (ref 4.0–10.5)

## 2021-01-07 LAB — POC URINALSYSI DIPSTICK (AUTOMATED)
Bilirubin, UA: NEGATIVE
Blood, UA: NEGATIVE
Glucose, UA: NEGATIVE
Ketones, UA: NEGATIVE
Leukocytes, UA: NEGATIVE
Nitrite, UA: NEGATIVE
Protein, UA: NEGATIVE
Spec Grav, UA: 1.015 (ref 1.010–1.025)
Urobilinogen, UA: 0.2 E.U./dL
pH, UA: 5.5 (ref 5.0–8.0)

## 2021-01-07 LAB — LIPID PANEL
Cholesterol: 149 mg/dL (ref 0–200)
HDL: 55.9 mg/dL (ref >39.00)
LDL Cholesterol: 67 mg/dL (ref 0–99)
NonHDL: 93.38
Total CHOL/HDL Ratio: 3
Triglycerides: 134 mg/dL (ref 0.0–149.0)
VLDL: 26.8 mg/dL (ref 0.0–40.0)

## 2021-01-07 MED ORDER — LISINOPRIL 40 MG PO TABS: 40.0000 mg | ORAL_TABLET | Freq: Every day | ORAL | 3 refills | Status: DC

## 2021-01-07 MED ORDER — HYDROCHLOROTHIAZIDE 25 MG PO TABS: 25.0000 mg | ORAL_TABLET | Freq: Every day | ORAL | 3 refills | Status: DC

## 2021-01-07 MED ORDER — AMLODIPINE BESYLATE 10 MG PO TABS
10.0000 mg | ORAL_TABLET | Freq: Every day | ORAL | 0 refills | Status: DC
Start: 2021-01-07 — End: 2021-03-10

## 2021-01-07 MED ORDER — FLUCONAZOLE 150 MG PO TABS: 150.0000 mg | ORAL_TABLET | Freq: Once | ORAL | 0 refills | Status: AC

## 2021-01-07 NOTE — Progress Notes (Signed)
Kari Coleman is a 70 y.o. female is here for follow up.    History of Present Illness:   Chief Complaint  Patient presents with   Hypertension   Vaginal Itching    HPI  Hypertension Pt here for f/u on blood pressure. She is currently taking amlodipine 10 mg daily, metoprolol xl 100 mg daily, lisinopril-hctz 20-25 mg daily. She is checking blood pressure at home daily. She says it has been running up and down systolic 810'F and diastolic 75'Z.Pt denies headaches, dizziness, chest pain, SOB or lower leg edema. Pt has some blurred vison due to eye Glaucoma. Denies excessive caffeine intake, stimulant usage, excessive alcohol intake or increase in salt consumption.  She continues to work closely with Dr. Loanne Drilling on getting her TSH under control. She endorses compliance. Sees him at the end of the month.  Vaginal itching Pt c/o external vaginal itching and odor x 2 weeks. Denies vaginal discharge. Tried OTC cream but did not help.  Health Maintenance Due  Topic Date Due   TETANUS/TDAP  Never done   Zoster Vaccines- Shingrix (1 of 2) Never done   COVID-19 Vaccine (3 - Booster for [employer] series) 12/07/2019   INFLUENZA VACCINE  11/01/2020    Past Medical History:  Diagnosis Date   Allergy    Arthritis    Cataract    Cervical cancer ([place])    GERD (gastroesophageal reflux disease)    Glaucoma 2017   History of echocardiogram    Echo 9/16:  Mild LVH, EF 55-60%, no RWMA, Gr 1 DD, mild MR // Echo 1/17:  Mild LVH, EF 50-55%, Gr 1 DD, mild to mod MR, mild LAE   Hypertension    Hyperthyroidism    s/p RAI treatment   Palpitations    PONV (postoperative nausea and vomiting)    years ago  . Woke up and saw asian people    Retinal detachment    L eye - partial blindness   Tobacco abuse    Tuberculosis    positive test as a caregiver cxr annually    Urinary incontinence      Social History   Tobacco Use   Smoking status: Former    Packs/day: 0.50    Years: 48.00    Pack  years: 24.00    Types: Cigarettes    Start date: 11/30/1968    Quit date: 10/31/2016    Years since quitting: 4.1   Smokeless tobacco: Never   Tobacco comments:    did  not smoke more than .5 pack   Vaping Use   Vaping Use: Never used  Substance Use Topics   Alcohol use: Not Currently    Alcohol/week: 0.0 standard drinks    Comment: rarely once a year   Drug use: No    Past Surgical History:  Procedure Laterality Date   ABDOMINAL HYSTERECTOMY  1984   bladder surgey for incontinence     EYE SURGERY     REPAIR OF COMPLEX TRACTION RETINAL DETACHMENT     thyroid radiation     TOTAL ABDOMINAL HYSTERECTOMY W/ BILATERAL SALPINGOOPHORECTOMY     TOTAL KNEE ARTHROPLASTY Right 04/22/2019   Procedure: RIGHT TOTAL KNEE ARTHROPLASTY;  Surgeon: Melrose Nakayama, MD;  Location: [facility];  Service: Orthopedics;  Laterality: Right;  PENDING PRE-OP FOR SDDC APPROVAL    Family History  Problem Relation Age of Onset   Hypertension Mother    Heart failure Mother    Diabetes Mother    Pancreatic cancer  Father    Colon cancer Maternal Aunt    Cancer Sister    Thyroid disease Paternal Aunt    Cancer Paternal Aunt    Thyroid disease Other        paternal side   Cancer Maternal Grandfather    Diabetes Paternal Grandmother    Diabetes Maternal Uncle    Colon polyps Neg Hx    Stomach cancer Neg Hx     PMHx, SurgHx, SocialHx, FamHx, Medications, and Allergies were reviewed in the Visit Navigator and updated as appropriate.   Patient Active Problem List   Diagnosis Date Noted   Vitamin D deficiency 01/22/2020   Multinodular goiter 01/22/2020   Fatigue 10/21/2019   Primary osteoarthritis of right knee 04/22/2019   Abnormal EKG 04/14/2019   Hypothyroidism 04/11/2019   Elevated glucose 04/08/2019   Chest discomfort 04/25/2018   Ex-smoker 04/25/2018   Primary localized osteoarthritis of right knee 01/04/2016   Hypertension     Social History   Tobacco Use   Smoking status: Former     Packs/day: 0.50    Years: 48.00    Pack years: 24.00    Types: Cigarettes    Start date: 11/30/1968    Quit date: 10/31/2016    Years since quitting: 4.1   Smokeless tobacco: Never   Tobacco comments:    did  not smoke more than .5 pack   Vaping Use   Vaping Use: Never used  Substance Use Topics   Alcohol use: Not Currently    Alcohol/week: 0.0 standard drinks    Comment: rarely once a year   Drug use: No    Current Medications and Allergies:    Current Outpatient Medications:    acetaminophen (TYLENOL) 325 MG tablet, Take 650 mg by mouth every 4 (four) hours as needed., Disp: , Rfl:    albuterol (PROVENTIL HFA) 108 (90 Base) MCG/ACT inhaler, Inhale 2 puffs into the lungs every 6 (six) hours as needed for wheezing or shortness of breath., Disp: 18 g, Rfl: 2   albuterol (VENTOLIN HFA) 108 (90 Base) MCG/ACT inhaler, INHALE 2 PUFFS INTO LUNGS EVERY 6 HOURS AS NEEDED FOR WHEEZING FOR SHORTNESS OF BREATH, Disp: 18 g, Rfl: 0   aspirin EC 81 MG tablet, Take 1 tablet (81 mg total) by mouth 2 (two) times daily after a meal., Disp: 30 tablet, Rfl: 0   brimonidine (ALPHAGAN) 0.2 % ophthalmic solution, Place 1 drop into the right eye 2 (two) times daily., Disp: , Rfl:    dorzolamide-timolol (COSOPT) 22.3-6.8 MG/ML ophthalmic solution, Place 1 drop into the right eye 2 (two) times daily., Disp: , Rfl:    fluconazole (DIFLUCAN) 150 MG tablet, Take 1 tablet (150 mg total) by mouth once for 1 dose., Disp: 1 tablet, Rfl: 0   hydrochlorothiazide (HYDRODIURIL) 25 MG tablet, Take 1 tablet (25 mg total) by mouth daily., Disp: 90 tablet, Rfl: 3   latanoprost (XALATAN) 0.005 % ophthalmic solution, Place 1 drop into both eyes at bedtime. As directed, Disp: , Rfl:    levothyroxine (SYNTHROID) 175 MCG tablet, Take 1 tablet (175 mcg total) by mouth daily., Disp: 90 tablet, Rfl: 3   lisinopril (ZESTRIL) 40 MG tablet, Take 1 tablet (40 mg total) by mouth daily., Disp: 90 tablet, Rfl: 3   metoprolol succinate  (TOPROL-XL) 100 MG 24 hr tablet, TAKE 1 TABLET BY MOUTH ONCE DAILY. TAKE WITH OR IMMEDIATELY FOLLOWING A MEAL, Disp: 90 tablet, Rfl: 1   pantoprazole (PROTONIX) 20 MG tablet, Take 1 tablet (  20 mg total) by mouth daily. Needs appointment or contact primary care physician for refills, Disp: 20 tablet, Rfl: 0   amLODipine (NORVASC) 10 MG tablet, Take 1 tablet (10 mg total) by mouth daily., Disp: 90 tablet, Rfl: 0   Allergies  Allergen Reactions   Doxycycline Nausea Only   Codeine Nausea And Vomiting   Pollen Extract     Review of Systems   ROS Negative unless otherwise specified per HPI.   Vitals:   Vitals:   01/07/21 1150  BP: (!) 158/90  Pulse: 70  Temp: 98.2 F (36.8 C)  TempSrc: Temporal  SpO2: 96%  Weight: 222 lb (100.7 kg)  Height: 5\' 7"  (1.702 m)     Body mass index is 34.77 kg/m.   Physical Exam:    Physical Exam Vitals and nursing note reviewed.  Constitutional:      General: She is not in acute distress.    Appearance: She is well-developed. She is not ill-appearing or toxic-appearing.  Cardiovascular:     Rate and Rhythm: Normal rate and regular rhythm.     Pulses: Normal pulses.     Heart sounds: Normal heart sounds, S1 normal and S2 normal.  Pulmonary:     Effort: Pulmonary effort is normal.     Breath sounds: Normal breath sounds.  Genitourinary:    Comments: Macerated skin in bilateral inner thighs; no discharge or weeping Skin:    General: Skin is warm and dry.  Neurological:     Mental Status: She is alert.     GCS: GCS eye subscore is 4. GCS verbal subscore is 5. GCS motor subscore is 6.  Psychiatric:        Speech: Speech normal.        Behavior: Behavior normal. Behavior is cooperative.   Results for orders placed or performed in visit on 01/07/21  POCT Urinalysis Dipstick (Automated)  Result Value Ref Range   Color, UA straw    Clarity, UA clear    Glucose, UA Negative Negative   Bilirubin, UA negative    Ketones, UA negative     Spec Grav, UA 1.015 1.010 - 1.025   Blood, UA negative    pH, UA 5.5 5.0 - 8.0   Protein, UA Negative Negative   Urobilinogen, UA 0.2 0.2 or 1.0 E.U./dL   Nitrite, UA negative    Leukocytes, UA Negative Negative      Assessment and Plan:   Essential hypertension Uncontrolled Asymptomatic Continue amlodipine 10 mg daily, metoprolol xl 100 mg daily, hctz 25 mg daily Increase lisinopril to 40 mg Check blood pressure regularly Follow-up in 1 month If remains above goal, will refer to cardiology  Vaginal burning Unclear etiology Did obtain vaginal swab for further evaluation and management. Will treat based on results  Rash Area appears to be candidal intertrigo Will trial one-time oral Diflucan tablet Also use zinc oxide barrier cream as needed to keep the area from further breakdown  CMA or LPN served as scribe during this visit. History, Physical, and Plan performed by medical provider. The above documentation has been reviewed and is accurate and complete.   Inda Coke, PA-C [place] [place] 01/07/2021  Follow-up: No follow-ups on file.

## 2021-01-07 NOTE — Patient Instructions (Signed)
It was great to see you!  Blood pressure: Continue norvasc/amlodopine 10 mg daily Continue metoprolol 100 mg  I have broken up your combo BP med You are now going to take 40 mg lisinopril and 25 mg hydrochlorothiazide  Keep checking BP 2-3 times per week  Follow-up with me in 1-3 months, sooner if concerns  For your vagina, keep inner thighs dry at all time After they are cleaned and dried, apply diaper cream (such as desitin -- something with ZINC OXIDE) to area regularly to help heal and prevent further break down  Take 1 time diflucan tablet  I will be in touch with blood work and swab results  Take care,  Inda Coke PA-C

## 2021-01-09 LAB — URINE CULTURE
MICRO NUMBER:: 12475453
SPECIMEN QUALITY:: ADEQUATE

## 2021-01-10 ENCOUNTER — Other Ambulatory Visit: Payer: Self-pay | Admitting: Physician Assistant

## 2021-01-10 DIAGNOSIS — N289 Disorder of kidney and ureter, unspecified: Secondary | ICD-10-CM

## 2021-01-10 LAB — CERVICOVAGINAL ANCILLARY ONLY
Bacterial Vaginitis (gardnerella): NEGATIVE
Candida Glabrata: NEGATIVE
Candida Vaginitis: NEGATIVE
Comment: NEGATIVE
Comment: NEGATIVE
Comment: NEGATIVE

## 2021-01-14 ENCOUNTER — Other Ambulatory Visit: Payer: Self-pay

## 2021-01-14 ENCOUNTER — Ambulatory Visit
Admission: RE | Admit: 2021-01-14 | Discharge: 2021-01-14 | Disposition: A | Discharge status: Home / Self Care | Payer: Medicare Other | Source: Ambulatory Visit | Attending: Physician Assistant | Admitting: Physician Assistant

## 2021-01-14 ENCOUNTER — Other Ambulatory Visit: Payer: Self-pay | Admitting: Physician Assistant

## 2021-01-14 DIAGNOSIS — R928 Other abnormal and inconclusive findings on diagnostic imaging of breast: Secondary | ICD-10-CM

## 2021-01-19 ENCOUNTER — Other Ambulatory Visit: Payer: Medicare Other

## 2021-01-20 ENCOUNTER — Ambulatory Visit
Admission: RE | Admit: 2021-01-20 | Discharge: 2021-01-20 | Disposition: A | Discharge status: Home / Self Care | Payer: Medicare Other | Source: Ambulatory Visit | Attending: Physician Assistant | Admitting: Physician Assistant

## 2021-01-20 ENCOUNTER — Other Ambulatory Visit: Payer: Self-pay

## 2021-01-20 DIAGNOSIS — R928 Other abnormal and inconclusive findings on diagnostic imaging of breast: Secondary | ICD-10-CM

## 2021-01-21 ENCOUNTER — Other Ambulatory Visit: Payer: Medicare Other

## 2021-01-21 DIAGNOSIS — N289 Disorder of kidney and ureter, unspecified: Secondary | ICD-10-CM

## 2021-01-22 LAB — BASIC METABOLIC PANEL WITH GFR
BUN/Creatinine Ratio: 23 (calc) — ABNORMAL HIGH (ref 6–22)
BUN: 28 mg/dL — ABNORMAL HIGH (ref 7–25)
CO2: 21 mmol/L (ref 20–32)
Calcium: 10 mg/dL (ref 8.6–10.4)
Chloride: 103 mmol/L (ref 98–110)
Creat: 1.23 mg/dL — ABNORMAL HIGH (ref 0.60–1.00)
Glucose, Bld: 150 mg/dL — ABNORMAL HIGH (ref 65–99)
Potassium: 3.8 mmol/L (ref 3.5–5.3)
Sodium: 138 mmol/L (ref 135–146)
eGFR: 47 mL/min/{1.73_m2} — ABNORMAL LOW (ref >=60)

## 2021-01-26 ENCOUNTER — Other Ambulatory Visit: Payer: Self-pay

## 2021-01-26 ENCOUNTER — Ambulatory Visit (INDEPENDENT_AMBULATORY_CARE_PROVIDER_SITE_OTHER): Payer: Medicare Other | Admitting: Endocrinology

## 2021-01-26 VITALS — BP 146/60 | HR 81 | Ht 67.0 in | Wt 224.0 lb

## 2021-01-26 DIAGNOSIS — E559 Vitamin D deficiency, unspecified: Secondary | ICD-10-CM

## 2021-01-26 DIAGNOSIS — E890 Postprocedural hypothyroidism: Secondary | ICD-10-CM | POA: Diagnosis not present

## 2021-01-26 NOTE — Patient Instructions (Addendum)
Your blood pressure is high today.  Please see your primary care provider soon, to have it rechecked.   Blood tests are requested for you today.  We'll let you know about the results.   It is best to never miss the levothyroxine or Vitamin-D.  However, if you do miss it, next best is to double up the next day.   Please come back for a follow-up appointment in 3 months.

## 2021-01-26 NOTE — Progress Notes (Signed)
Subjective:    Patient ID: Kari Coleman, female    DOB: 03-Mar-1951, 70 y.o.   MRN: [id number]  HPI Pt returns for f/u of post-RAI hypothyroidism and MNG (dx'ed 2013; she was initially rx'ed tapazole; [place] (2015) showed MNG; [place] scan (2016) showed MNG with normal uptake (24%); Cytology (2016) showed [place] CATEGORY 2 and 3; she took RAI rx 4/20; she started synthroid later in 2020; f/u US in 2021 advised no f/u US needed).  Since synthroid was increased, pt states she feels no different, and well in general.  She says she never misses the synthroid.  She takes Vit-D, but she does not recall the dosage.    Past Medical History:  Diagnosis Date   Allergy    Arthritis    Cataract    Cervical cancer ([place])    GERD (gastroesophageal reflux disease)    Glaucoma 2017   History of echocardiogram    Echo 9/16:  Mild LVH, EF 55-60%, no RWMA, Gr 1 DD, mild MR // Echo 1/17:  Mild LVH, EF 50-55%, Gr 1 DD, mild to mod MR, mild LAE   Hypertension    Hyperthyroidism    s/p RAI treatment   Palpitations    PONV (postoperative nausea and vomiting)    years ago  . Woke up and saw asian people    Retinal detachment    L eye - partial blindness   Tobacco abuse    Tuberculosis    positive test as a caregiver cxr annually    Urinary incontinence     Past Surgical History:  Procedure Laterality Date   ABDOMINAL HYSTERECTOMY  1984   bladder surgey for incontinence     EYE SURGERY     REPAIR OF COMPLEX TRACTION RETINAL DETACHMENT     thyroid radiation     TOTAL ABDOMINAL HYSTERECTOMY W/ BILATERAL SALPINGOOPHORECTOMY     TOTAL KNEE ARTHROPLASTY Right 04/22/2019   Procedure: RIGHT TOTAL KNEE ARTHROPLASTY;  Surgeon: Melrose Nakayama, MD;  Location: [facility];  Service: Orthopedics;  Laterality: Right;  PENDING PRE-OP FOR SDDC APPROVAL    Social History   Socioeconomic History   Marital status: Single    Spouse name: Not on file   Number of children: 2   Years of education: Not on file   Highest  education level: Not on file  Occupational History   Occupation: CNA    Comment: [employer]   Occupation: Retired  Tobacco Use   Smoking status: Former    Packs/day: 0.50    Years: 48.00    Pack years: 24.00    Types: Cigarettes    Start date: 11/30/1968    Quit date: 10/31/2016    Years since quitting: 4.2   Smokeless tobacco: Never   Tobacco comments:    did  not smoke more than .5 pack   Vaping Use   Vaping Use: Never used  Substance and Sexual Activity   Alcohol use: Not Currently    Alcohol/week: 0.0 standard drinks    Comment: rarely once a year   Drug use: No   Sexual activity: Not Currently  Other Topics Concern   Not on file  Social History Narrative   Caregiver   Daughter and grandson in household   Social Determinants of Health   Financial Resource Strain: Low Risk    Difficulty of Paying Living Expenses: Not hard at all  Food Insecurity: No Food Insecurity   Worried About Charity fundraiser in the Last  Year: Never true   [place] in the Last Year: Never true  Transportation Needs: No Transportation Needs   Lack of Transportation (Medical): No   Lack of Transportation (Non-Medical): No  Physical Activity: Inactive   Days of Exercise per Week: 0 days   Minutes of Exercise per Session: 0 min  Stress: No Stress Concern Present   Feeling of Stress : Only a little  Social Connections: Unknown   Frequency of Communication with Friends and Family: More than three times a week   Frequency of Social Gatherings with Friends and Family: Twice a week   Attends Religious Services: 1 to 4 times per year   Active Member of [employer] or Organizations: No   Attends Music therapist: Never   Marital Status: Not on file  Intimate Partner Violence: Not At Risk   Fear of Current or Ex-Partner: No   Emotionally Abused: No   Physically Abused: No   Sexually Abused: No    Current Outpatient Medications on File Prior to Visit  Medication Sig  Dispense Refill   acetaminophen (TYLENOL) 325 MG tablet Take 650 mg by mouth every 4 (four) hours as needed.     albuterol (PROVENTIL HFA) 108 (90 Base) MCG/ACT inhaler Inhale 2 puffs into the lungs every 6 (six) hours as needed for wheezing or shortness of breath. 18 g 2   albuterol (VENTOLIN HFA) 108 (90 Base) MCG/ACT inhaler INHALE 2 PUFFS INTO LUNGS EVERY 6 HOURS AS NEEDED FOR WHEEZING FOR SHORTNESS OF BREATH 18 g 0   amLODipine (NORVASC) 10 MG tablet Take 1 tablet (10 mg total) by mouth daily. 90 tablet 0   aspirin EC 81 MG tablet Take 1 tablet (81 mg total) by mouth 2 (two) times daily after a meal. 30 tablet 0   brimonidine (ALPHAGAN) 0.2 % ophthalmic solution Place 1 drop into the right eye 2 (two) times daily.     dorzolamide-timolol (COSOPT) 22.3-6.8 MG/ML ophthalmic solution Place 1 drop into the right eye 2 (two) times daily.     hydrochlorothiazide (HYDRODIURIL) 25 MG tablet Take 1 tablet (25 mg total) by mouth daily. 90 tablet 3   latanoprost (XALATAN) 0.005 % ophthalmic solution Place 1 drop into both eyes at bedtime. As directed     levothyroxine (SYNTHROID) 175 MCG tablet Take 1 tablet (175 mcg total) by mouth daily. 90 tablet 3   lisinopril (ZESTRIL) 40 MG tablet Take 1 tablet (40 mg total) by mouth daily. 90 tablet 3   metoprolol succinate (TOPROL-XL) 100 MG 24 hr tablet TAKE 1 TABLET BY MOUTH ONCE DAILY. TAKE WITH OR IMMEDIATELY FOLLOWING A MEAL 90 tablet 1   pantoprazole (PROTONIX) 20 MG tablet Take 1 tablet (20 mg total) by mouth daily. Needs appointment or contact primary care physician for refills 20 tablet 0   No current facility-administered medications on file prior to visit.    Allergies  Allergen Reactions   Doxycycline Nausea Only   Codeine Nausea And Vomiting   Pollen Extract     Family History  Problem Relation Age of Onset   Hypertension Mother    Heart failure Mother    Diabetes Mother    Pancreatic cancer Father    Colon cancer Maternal Aunt     Cancer Sister    Thyroid disease Paternal Aunt    Cancer Paternal Aunt    Thyroid disease Other        paternal side   Cancer Maternal Grandfather    Diabetes  Paternal Grandmother    Diabetes Maternal Uncle    Colon polyps Neg Hx    Stomach cancer Neg Hx     BP (!) 146/60 (BP Location: Right Arm, Patient Position: Sitting, Cuff Size: Large)   Pulse 81   Ht 5\' 7"  (1.702 m)   Wt 224 lb (101.6 kg)   SpO2 97%   BMI 35.08 kg/m    Review of Systems     Objective:   Physical Exam NECK: There is no palpable thyroid enlargement.  No thyroid nodule is palpable.  No palpable lymphadenopathy at the anterior neck.   Lab Results  Component Value Date   TSH 1.30 01/26/2021      Assessment & Plan:  Hypothyroidism: well-controlled.  Please continue the same synthroid.   Vit-D def: recheck today.

## 2021-01-27 LAB — TSH: TSH: 1.3 u[IU]/mL (ref 0.35–5.50)

## 2021-01-27 LAB — T4, FREE: Free T4: 1.05 ng/dL (ref 0.60–1.60)

## 2021-01-27 LAB — VITAMIN D 25 HYDROXY (VIT D DEFICIENCY, FRACTURES): VITD: 17.16 ng/mL — ABNORMAL LOW (ref 30.00–100.00)

## 2021-02-18 ENCOUNTER — Other Ambulatory Visit: Payer: Self-pay

## 2021-02-18 ENCOUNTER — Encounter: Payer: Self-pay | Admitting: Physician Assistant

## 2021-02-18 ENCOUNTER — Other Ambulatory Visit (HOSPITAL_COMMUNITY)
Admission: RE | Admit: 2021-02-18 | Discharge: 2021-02-18 | Disposition: A | Discharge status: Home / Self Care | Payer: Medicare Other | Source: Ambulatory Visit | Attending: Physician Assistant | Admitting: Physician Assistant

## 2021-02-18 ENCOUNTER — Ambulatory Visit (INDEPENDENT_AMBULATORY_CARE_PROVIDER_SITE_OTHER): Payer: Medicare Other | Admitting: Physician Assistant

## 2021-02-18 VITALS — BP 150/90 | HR 62 | Temp 97.3°F | Ht 67.0 in | Wt 221.0 lb

## 2021-02-18 DIAGNOSIS — R739 Hyperglycemia, unspecified: Secondary | ICD-10-CM | POA: Diagnosis not present

## 2021-02-18 DIAGNOSIS — R051 Acute cough: Secondary | ICD-10-CM | POA: Diagnosis not present

## 2021-02-18 DIAGNOSIS — I10 Essential (primary) hypertension: Secondary | ICD-10-CM | POA: Diagnosis not present

## 2021-02-18 DIAGNOSIS — N898 Other specified noninflammatory disorders of vagina: Secondary | ICD-10-CM

## 2021-02-18 LAB — BASIC METABOLIC PANEL
BUN: 22 mg/dL (ref 6–23)
CO2: 27 mEq/L (ref 19–32)
Calcium: 10.1 mg/dL (ref 8.4–10.5)
Chloride: 101 mEq/L (ref 96–112)
Creatinine, Ser: 1.17 mg/dL (ref 0.40–1.20)
GFR: 46.65 mL/min — ABNORMAL LOW (ref >60.00)
Glucose, Bld: 88 mg/dL (ref 70–99)
Potassium: 3.6 mEq/L (ref 3.5–5.1)
Sodium: 137 mEq/L (ref 135–145)

## 2021-02-18 LAB — POC URINALSYSI DIPSTICK (AUTOMATED)
Bilirubin, UA: NEGATIVE
Blood, UA: NEGATIVE
Glucose, UA: NEGATIVE
Ketones, UA: NEGATIVE
Leukocytes, UA: NEGATIVE
Nitrite, UA: NEGATIVE
Protein, UA: NEGATIVE
Spec Grav, UA: 1.02 (ref 1.010–1.025)
Urobilinogen, UA: 0.2 E.U./dL — NL
pH, UA: 6 (ref 5.0–8.0)

## 2021-02-18 LAB — HEMOGLOBIN A1C: Hgb A1c MFr Bld: 5.7 % (ref 4.6–6.5)

## 2021-02-18 NOTE — Patient Instructions (Signed)
It was great to see you!  I have put in a referral for you to see the cardiologist to discuss your blood pressure. Continue your current blood pressure regimen for now.  I will be in touch with your urine results and vaginal swab results.  Please follow-up with your urologist about your ongoing urinary issues.  Take care,  Inda Coke PA-C

## 2021-02-18 NOTE — Progress Notes (Signed)
Kari Coleman is a 70 y.o. female here for a follow up of vaginal infection and HTN.   History of Present Illness:   Chief Complaint  Patient presents with   Hypertension   vaginal odor    Pt still c/o vaginal odor, says itching has resolved.    HPI  HTN Currently compliant with taking norvasc 10 mg daily , metoprolol xl 100 mg daily, lisinopril 40 mg daily, HCTZ 25 mg daily with no adverse effects. At home blood pressure readings are: not regularly checked. Patient denies chest pain, SOB, blurred vision, dizziness, unusual headaches, lower leg swelling. Denies excessive caffeine intake, stimulant usage, excessive alcohol intake, or increase in salt consumption.  BP Readings from Last 3 Encounters:  02/18/21 (!) 150/90  01/26/21 (!) 146/60  01/07/21 (!) 158/90   Vaginal Itching Pt here for f/u. She saw me on 01/07/21 and we treated her with oral fluconazole and recommended zinc oxide barrier ointment. She states that the issue has improved however she has vaginal odor. Continues to have ongoing urinary incontinence. Denies dysuria or hematuria.  Cough Recently had URI. Everyone at her house had it. She was in the bed for 4 days. Denies chest pain, SOB. Did have wheezing but this resolved. Took OTC medication - Coricidin.  Elevated blood sugar Found on lab work at last check. Will update A1c today. Denies hypoglycemia or hyperglycemia episodes.  Past Medical History:  Diagnosis Date   Allergy    Arthritis    Cataract    Cervical cancer ([place])    GERD (gastroesophageal reflux disease)    Glaucoma 2017   History of echocardiogram    Echo 9/16:  Mild LVH, EF 55-60%, no RWMA, Gr 1 DD, mild MR // Echo 1/17:  Mild LVH, EF 50-55%, Gr 1 DD, mild to mod MR, mild LAE   Hypertension    Hyperthyroidism    s/p RAI treatment   Palpitations    PONV (postoperative nausea and vomiting)    years ago  . Woke up and saw asian people    Retinal detachment    L eye - partial blindness    Tobacco abuse    Tuberculosis    positive test as a caregiver cxr annually    Urinary incontinence      Social History   Tobacco Use   Smoking status: Former    Packs/day: 0.50    Years: 48.00    Pack years: 24.00    Types: Cigarettes    Start date: 11/30/1968    Quit date: 10/31/2016    Years since quitting: 4.3   Smokeless tobacco: Never   Tobacco comments:    did  not smoke more than .5 pack   Vaping Use   Vaping Use: Never used  Substance Use Topics   Alcohol use: Not Currently    Alcohol/week: 0.0 standard drinks    Comment: rarely once a year   Drug use: No    Past Surgical History:  Procedure Laterality Date   ABDOMINAL HYSTERECTOMY  1984   bladder surgey for incontinence     EYE SURGERY     REPAIR OF COMPLEX TRACTION RETINAL DETACHMENT     thyroid radiation     TOTAL ABDOMINAL HYSTERECTOMY W/ BILATERAL SALPINGOOPHORECTOMY     TOTAL KNEE ARTHROPLASTY Right 04/22/2019   Procedure: RIGHT TOTAL KNEE ARTHROPLASTY;  Surgeon: Melrose Nakayama, MD;  Location: [facility];  Service: Orthopedics;  Laterality: Right;  PENDING PRE-OP FOR SDDC APPROVAL    Family  History  Problem Relation Age of Onset   Hypertension Mother    Heart failure Mother    Diabetes Mother    Pancreatic cancer Father    Colon cancer Maternal Aunt    Cancer Sister    Thyroid disease Paternal Aunt    Cancer Paternal Aunt    Thyroid disease Other        paternal side   Cancer Maternal Grandfather    Diabetes Paternal Grandmother    Diabetes Maternal Uncle    Colon polyps Neg Hx    Stomach cancer Neg Hx     Allergies  Allergen Reactions   Doxycycline Nausea Only   Codeine Nausea And Vomiting   Pollen Extract     Current Medications:   Current Outpatient Medications:    acetaminophen (TYLENOL) 325 MG tablet, Take 650 mg by mouth every 4 (four) hours as needed., Disp: , Rfl:    albuterol (PROVENTIL HFA) 108 (90 Base) MCG/ACT inhaler, Inhale 2 puffs into the lungs every 6 (six) hours as  needed for wheezing or shortness of breath., Disp: 18 g, Rfl: 2   albuterol (VENTOLIN HFA) 108 (90 Base) MCG/ACT inhaler, INHALE 2 PUFFS INTO LUNGS EVERY 6 HOURS AS NEEDED FOR WHEEZING FOR SHORTNESS OF BREATH, Disp: 18 g, Rfl: 0   amLODipine (NORVASC) 10 MG tablet, Take 1 tablet (10 mg total) by mouth daily., Disp: 90 tablet, Rfl: 0   aspirin EC 81 MG tablet, Take 1 tablet (81 mg total) by mouth 2 (two) times daily after a meal., Disp: 30 tablet, Rfl: 0   brimonidine (ALPHAGAN) 0.2 % ophthalmic solution, Place 1 drop into the right eye 2 (two) times daily., Disp: , Rfl:    dorzolamide-timolol (COSOPT) 22.3-6.8 MG/ML ophthalmic solution, Place 1 drop into the right eye 2 (two) times daily., Disp: , Rfl:    hydrochlorothiazide (HYDRODIURIL) 25 MG tablet, Take 1 tablet (25 mg total) by mouth daily., Disp: 90 tablet, Rfl: 3   latanoprost (XALATAN) 0.005 % ophthalmic solution, Place 1 drop into both eyes at bedtime. As directed, Disp: , Rfl:    levothyroxine (SYNTHROID) 175 MCG tablet, Take 1 tablet (175 mcg total) by mouth daily., Disp: 90 tablet, Rfl: 3   lisinopril (ZESTRIL) 40 MG tablet, Take 1 tablet (40 mg total) by mouth daily., Disp: 90 tablet, Rfl: 3   metoprolol succinate (TOPROL-XL) 100 MG 24 hr tablet, TAKE 1 TABLET BY MOUTH ONCE DAILY. TAKE WITH OR IMMEDIATELY FOLLOWING A MEAL, Disp: 90 tablet, Rfl: 1   pantoprazole (PROTONIX) 20 MG tablet, Take 1 tablet (20 mg total) by mouth daily. Needs appointment or contact primary care physician for refills, Disp: 20 tablet, Rfl: 0   Review of Systems:   ROS Negative unless otherwise specified per HPI. Vitals:   Vitals:   02/18/21 1413  BP: (!) 150/90  Pulse: 62  Temp: (!) 97.3 F (36.3 C)  TempSrc: Temporal  SpO2: 97%  Weight: 221 lb (100.2 kg)  Height: 5\' 7"  (1.702 m)     Body mass index is 34.61 kg/m.  Physical Exam:   Physical Exam Vitals and nursing note reviewed.  Constitutional:      General: She is not in acute distress.     Appearance: She is well-developed. She is not ill-appearing or toxic-appearing.  Cardiovascular:     Rate and Rhythm: Normal rate and regular rhythm.     Pulses: Normal pulses.     Heart sounds: Normal heart sounds, S1 normal and S2 normal.  Pulmonary:  Effort: Pulmonary effort is normal.     Breath sounds: Normal breath sounds.  Skin:    General: Skin is warm and dry.  Neurological:     Mental Status: She is alert.     GCS: GCS eye subscore is 4. GCS verbal subscore is 5. GCS motor subscore is 6.  Psychiatric:        Speech: Speech normal.        Behavior: Behavior normal. Behavior is cooperative.   Results for orders placed or performed in visit on 02/18/21  POCT Urinalysis Dipstick (Automated)  Result Value Ref Range   Color, UA Yellow    Clarity, UA Clear    Glucose, UA Negative Negative   Bilirubin, UA Neg    Ketones, UA Neg    Spec Grav, UA 1.020 1.010 - 1.025   Blood, UA Neg    pH, UA 6.0 5.0 - 8.0   Protein, UA Negative Negative   Urobilinogen, UA 0.2 0.2 or 1.0 E.U./dL   Nitrite, UA Neg    Leukocytes, UA Negative Negative     Assessment and Plan:   Vaginal odor Patient obtained vaginal self swab Urinalysis normal -- will send off for culture however I did ask her to follow-up with urology regarding her ongoing urinary issues as I suspect this is contributing to her symptoms Will treat if culture and/or vaginal swab is positive  Acute cough Symptoms overall resolved  Lung exam benign and oxygen level stable Follow-up as needed  Essential hypertension Continues to remain above goal on max therapy Continue norvasc 10 mg daily , metoprolol xl 100 mg daily, lisinopril 40 mg daily, HCTZ 25 mg daily Referral to cardiology for help with management  Elevated blood sugar Update A1c and provide recommendations accordingly. Follow-up as needed.   I,Havlyn C Ratchford,acting as a Education administrator for [employer], PA.,have documented all relevant documentation on  the behalf of Inda Coke, PA,as directed by  Inda Coke, PA while in the presence of Inda Coke, [place].  I, Inda Coke, [place], have reviewed all documentation for this visit. The documentation on 02/18/21 for the exam, diagnosis, procedures, and orders are all accurate and complete.  Inda Coke, PA-C

## 2021-02-20 LAB — URINE CULTURE
MICRO NUMBER:: 12657448
SPECIMEN QUALITY:: ADEQUATE

## 2021-02-21 ENCOUNTER — Other Ambulatory Visit: Payer: Self-pay | Admitting: Physician Assistant

## 2021-02-21 LAB — CERVICOVAGINAL ANCILLARY ONLY
Bacterial Vaginitis (gardnerella): NEGATIVE
Candida Glabrata: NEGATIVE
Candida Vaginitis: NEGATIVE
Comment: NEGATIVE
Comment: NEGATIVE
Comment: NEGATIVE

## 2021-02-21 MED ORDER — CIPROFLOXACIN HCL 250 MG PO TABS: 250.0000 mg | ORAL_TABLET | Freq: Two times a day (BID) | ORAL | 0 refills | Status: AC

## 2021-03-02 DIAGNOSIS — H402233 Chronic angle-closure glaucoma, bilateral, severe stage: Secondary | ICD-10-CM | POA: Insufficient documentation

## 2021-03-10 ENCOUNTER — Other Ambulatory Visit: Payer: Self-pay | Admitting: Physician Assistant

## 2021-05-06 ENCOUNTER — Ambulatory Visit: Payer: Medicare Other | Admitting: Endocrinology

## 2021-05-12 ENCOUNTER — Other Ambulatory Visit: Payer: Self-pay | Admitting: Physician Assistant

## 2021-05-31 ENCOUNTER — Other Ambulatory Visit: Payer: Self-pay

## 2021-05-31 ENCOUNTER — Encounter: Payer: Self-pay | Admitting: Physician Assistant

## 2021-05-31 ENCOUNTER — Encounter: Payer: Self-pay | Admitting: *Deleted

## 2021-05-31 ENCOUNTER — Ambulatory Visit (INDEPENDENT_AMBULATORY_CARE_PROVIDER_SITE_OTHER): Payer: Medicare Other | Admitting: Physician Assistant

## 2021-05-31 VITALS — BP 120/80 | HR 66 | Temp 97.7°F | Ht 67.0 in | Wt 225.4 lb

## 2021-05-31 DIAGNOSIS — Z87891 Personal history of nicotine dependence: Secondary | ICD-10-CM | POA: Diagnosis not present

## 2021-05-31 DIAGNOSIS — R32 Unspecified urinary incontinence: Secondary | ICD-10-CM

## 2021-05-31 DIAGNOSIS — R0609 Other forms of dyspnea: Secondary | ICD-10-CM

## 2021-05-31 MED ORDER — SPACER/AERO-HOLD CHAMBER MASK MISC: 1.0000 | Freq: Once | 0 refills | Status: AC

## 2021-05-31 MED ORDER — ALBUTEROL SULFATE HFA 108 (90 BASE) MCG/ACT IN AERS: 2.0000 | INHALATION_SPRAY | Freq: Four times a day (QID) | RESPIRATORY_TRACT | 2 refills | Status: DC | PRN

## 2021-05-31 MED ORDER — SPACER/AERO-HOLDING CHAMBERS DEVI: 1.0000 | Freq: Once | 0 refills | Status: AC

## 2021-05-31 NOTE — Progress Notes (Addendum)
Kari Coleman is a 71 y.o. female here for a shortness of breath.  History of Present Illness:   Chief Complaint  Patient presents with   Shortness of Breath    Pt c/o SOB when she goes outside the past month with the weather change.   Shortness of Breath [name] presents with c/o feelings of sob that has been onset for about a month. According to pt, she only experiences this upon going outside and believes it could be caused by the recent weather change. States that her breathing starts to sound like a wheezing noise that resolves once she is indoors. Despite feeling this and having access to her albuterol 108 mcg base inhaler, she does not find herself using the inhaler as often. Upon further discussion, she does admit that this could be caused by the weather or by her exerting herself more when she is outdoors. Pt does have a hx of tobacco use that lasted for 48 years before she stopped in 2018.   Denies chest pain, LE swelling, hx of Covid 19 infection, lightheadedness, or dizziness.   Echo in 2019 was overall normal.   Smoking History Smoked from 1970-2018. She has smoked 1/2 PPD on average. Total pack years is 24.   Lower UTI Symptoms She has seen Dr. Matilde Sprang in the past for urology. She is interested in a new provider if possible as she is not happy with her ongoing urinary incontinence as it has remained significantly uncontrolled.    Past Medical History:  Diagnosis Date   Allergy    Arthritis    Cataract    Cervical cancer ([place])    GERD (gastroesophageal reflux disease)    Glaucoma 2017   History of echocardiogram    Echo 9/16:  Mild LVH, EF 55-60%, no RWMA, Gr 1 DD, mild MR // Echo 1/17:  Mild LVH, EF 50-55%, Gr 1 DD, mild to mod MR, mild LAE   Hypertension    Hyperthyroidism    s/p RAI treatment   Palpitations    PONV (postoperative nausea and vomiting)    years ago  . Woke up and saw asian people    Retinal detachment    L eye - partial blindness    Tobacco abuse    Tuberculosis    positive test as a caregiver cxr annually    Urinary incontinence      Social History   Tobacco Use   Smoking status: Former    Packs/day: 0.50    Years: 48.00    Pack years: 24.00    Types: Cigarettes    Start date: 11/30/1968    Quit date: 10/31/2016    Years since quitting: 4.5   Smokeless tobacco: Never   Tobacco comments:    did  not smoke more than .5 pack   Vaping Use   Vaping Use: Never used  Substance Use Topics   Alcohol use: Not Currently    Alcohol/week: 0.0 standard drinks    Comment: rarely once a year   Drug use: No    Past Surgical History:  Procedure Laterality Date   ABDOMINAL HYSTERECTOMY  1984   bladder surgey for incontinence     EYE SURGERY     REPAIR OF COMPLEX TRACTION RETINAL DETACHMENT     thyroid radiation     TOTAL ABDOMINAL HYSTERECTOMY W/ BILATERAL SALPINGOOPHORECTOMY     TOTAL KNEE ARTHROPLASTY Right 04/22/2019   Procedure: RIGHT TOTAL KNEE ARTHROPLASTY;  Surgeon: Melrose Nakayama, MD;  Location: [facility];  Service: Orthopedics;  Laterality: Right;  PENDING PRE-OP FOR SDDC APPROVAL    Family History  Problem Relation Age of Onset   Hypertension Mother    Heart failure Mother    Diabetes Mother    Pancreatic cancer Father    Colon cancer Maternal Aunt    Cancer Sister    Thyroid disease Paternal Aunt    Cancer Paternal Aunt    Thyroid disease Other        paternal side   Cancer Maternal Grandfather    Diabetes Paternal Grandmother    Diabetes Maternal Uncle    Colon polyps Neg Hx    Stomach cancer Neg Hx     Allergies  Allergen Reactions   Doxycycline Nausea Only   Codeine Nausea And Vomiting   Pollen Extract     Current Medications:   Current Outpatient Medications:    acetaminophen (TYLENOL) 325 MG tablet, Take 650 mg by mouth every 4 (four) hours as needed., Disp: , Rfl:    albuterol (PROVENTIL HFA) 108 (90 Base) MCG/ACT inhaler, Inhale 2 puffs into  the lungs every 6 (six) hours as needed for wheezing or shortness of breath., Disp: 18 g, Rfl: 2   amLODipine (NORVASC) 10 MG tablet, TAKE 1 TABLET BY MOUTH  DAILY, Disp: 90 tablet, Rfl: 3   aspirin EC 81 MG tablet, Take 1 tablet (81 mg total) by mouth 2 (two) times daily after a meal., Disp: 30 tablet, Rfl: 0   brimonidine (ALPHAGAN) 0.2 % ophthalmic solution, Place 1 drop into the right eye 2 (two) times daily., Disp: , Rfl:    dorzolamide-timolol (COSOPT) 22.3-6.8 MG/ML ophthalmic solution, Place 1 drop into the right eye 2 (two) times daily., Disp: , Rfl:    hydrochlorothiazide (HYDRODIURIL) 25 MG tablet, Take 1 tablet (25 mg total) by mouth daily., Disp: 90 tablet, Rfl: 3   ketorolac (ACULAR) 0.4 % SOLN, Place 1 drop into the right eye 4 times daily., Disp: , Rfl:    latanoprost (XALATAN) 0.005 % ophthalmic solution, Place 1 drop into both eyes at bedtime. As directed, Disp: , Rfl:    levothyroxine (SYNTHROID) 175 MCG tablet, Take 1 tablet (175 mcg total) by mouth daily., Disp: 90 tablet, Rfl: 3   lisinopril (ZESTRIL) 40 MG tablet, Take 1 tablet (40 mg total) by mouth daily., Disp: 90 tablet, Rfl: 3   metoprolol succinate (TOPROL-XL) 100 MG 24 hr tablet, TAKE 1 TABLET BY MOUTH ONCE  DAILY ; TAKE WITH OR IMMEDIATELY FOLLOWING A MEAL, Disp: 90 tablet, Rfl: 1   pantoprazole (PROTONIX) 20 MG tablet, Take 1 tablet (20 mg total) by mouth daily. Needs appointment or contact primary care physician for refills, Disp: 20 tablet, Rfl: 0   Review of Systems:   Review of Systems  Respiratory:  Positive for shortness of breath.    Vitals:   Vitals:   05/31/21 1442  BP: 120/80  Pulse: 66  Temp: 97.7 F (36.5 C)  TempSrc: Temporal  SpO2: 96%  Weight: 225 lb 6.1 oz (102.2 kg)  Height: 5\' 7"  (1.702 m)     Body mass index is 35.3 kg/m.  Physical Exam:   Physical Exam Vitals and nursing note reviewed.  Constitutional:      General: She is not in acute distress.    Appearance: She  is well-developed. She is not ill-appearing or toxic-appearing.  Cardiovascular:     Rate and Rhythm: Normal rate and regular rhythm.     Pulses: Normal pulses.  Heart sounds: Normal heart sounds, S1 normal and S2 normal.  Pulmonary:     Effort: Pulmonary effort is normal.     Breath sounds: Normal breath sounds.  Skin:    General: Skin is warm and dry.  Neurological:     Mental Status: She is alert.     GCS: GCS eye subscore is 4. GCS verbal subscore is 5. GCS motor subscore is 6.  Psychiatric:        Speech: Speech normal.        Behavior: Behavior normal. Behavior is cooperative.    Assessment and Plan:   Shortness of breath Uncontrolled No red flags - no chest pain; last echo reviewed Start using spacer with Albuterol 108 mcg base inhaler--- watched demo video together and discussed Informed patient to take 6 deep breaths once medicine is in mask  Update labs, will make recommendations accordingly  Ordered calcium CT score Follow-up in 1 week, sooner if concerns  Pulse ox while walking got down to 92%. No obvious retraction or severe SOB with this.  Lower UTI Symptoms/Urinary Incontinence New referral to [place] placed today  Smoking hx Referral for Lung Cancer screening  I,Havlyn C Ratchford,acting as a scribe for Inda Coke, PA.,have documented all relevant documentation on the behalf of Inda Coke, PA,as directed by  Inda Coke, PA while in the presence of Inda Coke, [place].  I, Inda Coke, [place], have reviewed all documentation for this visit. The documentation on 05/31/21 for the exam, diagnosis, procedures, and orders are all accurate and complete.  Inda Coke, PA-C

## 2021-05-31 NOTE — Patient Instructions (Addendum)
It was great to see you!  Please pick up a spacer and a mask from the pharmacy -- I have sent this in for you. Start using this with your inhaler as we discussed. Remember to take 6 breaths once the medicine is in the mask. Always shake inhaler prior to use.  We are going to update your blood work today  We are going to send you for TWO CT scans Heart -- this will check for plaque around your heart Lungs -- this will screen for lung cancer  Let's follow-up in 1-2 weeks, sooner if you have concerns.  If a referral was placed today, you will be contacted for an appointment. Please note that routine referrals can sometimes take up to 3-4 weeks to process. Please call our office if you haven't heard anything after this time frame.  Take care,  Inda Coke PA-C

## 2021-06-01 LAB — CBC WITH DIFFERENTIAL/PLATELET
Basophils Absolute: 0.1 10*3/uL (ref 0.0–0.1)
Basophils Relative: 0.9 % (ref 0.0–3.0)
Eosinophils Absolute: 0.3 10*3/uL (ref 0.0–0.7)
Eosinophils Relative: 4.5 % (ref 0.0–5.0)
HCT: 39.6 % (ref 36.0–46.0)
Hemoglobin: 13.2 g/dL (ref 12.0–15.0)
Lymphocytes Relative: 44.5 % (ref 12.0–46.0)
Lymphs Abs: 2.7 10*3/uL (ref 0.7–4.0)
MCHC: 33.4 g/dL (ref 30.0–36.0)
MCV: 90.3 fl (ref 78.0–100.0)
Monocytes Absolute: 0.4 10*3/uL (ref 0.1–1.0)
Monocytes Relative: 6.4 % (ref 3.0–12.0)
Neutro Abs: 2.6 10*3/uL (ref 1.4–7.7)
Neutrophils Relative %: 43.7 % (ref 43.0–77.0)
Platelets: 260 10*3/uL (ref 150.0–400.0)
RBC: 4.39 Mil/uL (ref 3.87–5.11)
RDW: 13.1 % (ref 11.5–15.5)
WBC: 6 10*3/uL (ref 4.0–10.5)

## 2021-06-01 LAB — COMPREHENSIVE METABOLIC PANEL
ALT: 20 U/L (ref 0–35)
AST: 18 U/L (ref 0–37)
Albumin: 4.2 g/dL (ref 3.5–5.2)
Alkaline Phosphatase: 76 U/L (ref 39–117)
BUN: 21 mg/dL (ref 6–23)
CO2: 29 mEq/L (ref 19–32)
Calcium: 10.3 mg/dL (ref 8.4–10.5)
Chloride: 104 mEq/L (ref 96–112)
Creatinine, Ser: 1.3 mg/dL — ABNORMAL HIGH (ref 0.40–1.20)
GFR: 41.02 mL/min — ABNORMAL LOW (ref >60.00)
Glucose, Bld: 104 mg/dL — ABNORMAL HIGH (ref 70–99)
Potassium: 4.5 mEq/L (ref 3.5–5.1)
Sodium: 141 mEq/L (ref 135–145)
Total Bilirubin: 0.4 mg/dL (ref 0.2–1.2)
Total Protein: 7.9 g/dL (ref 6.0–8.3)

## 2021-06-01 LAB — LIPID PANEL
Cholesterol: 152 mg/dL (ref 0–200)
HDL: 53.3 mg/dL (ref >39.00)
LDL Cholesterol: 71 mg/dL (ref 0–99)
NonHDL: 99.16
Total CHOL/HDL Ratio: 3
Triglycerides: 143 mg/dL (ref 0.0–149.0)
VLDL: 28.6 mg/dL (ref 0.0–40.0)

## 2021-06-09 ENCOUNTER — Other Ambulatory Visit: Payer: Self-pay | Admitting: Endocrinology

## 2021-07-12 ENCOUNTER — Ambulatory Visit (INDEPENDENT_AMBULATORY_CARE_PROVIDER_SITE_OTHER)
Admission: RE | Admit: 2021-07-12 | Discharge: 2021-07-12 | Disposition: A | Discharge status: Home / Self Care | Payer: Medicare Other | Source: Ambulatory Visit | Attending: Physician Assistant | Admitting: Physician Assistant

## 2021-07-12 ENCOUNTER — Ambulatory Visit (INDEPENDENT_AMBULATORY_CARE_PROVIDER_SITE_OTHER): Payer: Medicare Other | Admitting: Physician Assistant

## 2021-07-12 ENCOUNTER — Encounter: Payer: Self-pay | Admitting: Physician Assistant

## 2021-07-12 VITALS — BP 180/90 | HR 69 | Temp 98.5°F | Ht 67.0 in | Wt 223.2 lb

## 2021-07-12 DIAGNOSIS — I10 Essential (primary) hypertension: Secondary | ICD-10-CM

## 2021-07-12 DIAGNOSIS — R0609 Other forms of dyspnea: Secondary | ICD-10-CM

## 2021-07-12 DIAGNOSIS — N1832 Chronic kidney disease, stage 3b: Secondary | ICD-10-CM | POA: Diagnosis not present

## 2021-07-12 DIAGNOSIS — Z87891 Personal history of nicotine dependence: Secondary | ICD-10-CM

## 2021-07-12 MED ORDER — MOMETASONE FURO-FORMOTEROL FUM 100-5 MCG/ACT IN AERO: 2.0000 | INHALATION_SPRAY | Freq: Two times a day (BID) | RESPIRATORY_TRACT | 0 refills | Status: DC

## 2021-07-12 MED ORDER — ALBUTEROL SULFATE (2.5 MG/3ML) 0.083% IN NEBU: 2.5000 mg | INHALATION_SOLUTION | Freq: Four times a day (QID) | RESPIRATORY_TRACT | 1 refills | Status: DC | PRN

## 2021-07-12 NOTE — Progress Notes (Signed)
Kari Coleman is a 71 y.o. female here for chest congestion. ? ?History of Present Illness:  ? ?Chief Complaint  ?Patient presents with  ? chest congestion  ?  Pt has been wheezing since feb of this yr. She is coughing up mucous but it is white and chest feels heavy at times  ? ?Melana presented to today's visit with her daughter, Kari Coleman.  ? ?HPI ? ?Chest Congestion ?Since our previous visit on 05/31/21, pt is still experiencing wheezing and cough. States she is now expectorating white colored mucous and experiencing occasional chest heaviness. In an effort to keep her wheezing under control she has been using her albuterol inhaler daily with a spacer. She does find this beneficial in terms of her SOB, but this hasn't helped her wheezing. According to her daughter, there are times she is able to hear her wheezing when she is another room. Denies fevers, chills, lightheadedness. ? ?During today's visit it was discovered she has not been able to follow up on any of our previously discussed interventions such as referral to pulmonology for lung cancer screenings, scheduling her CT calcium scan, and visiting with a cardiologist.  ? ?She most recently saw cardiology in Jan 2021,, at [facility] for surgical clearance. She was had normal myocardial perfusion and holter monitor. Last echo in 2019 was overall unremarkable -- she id have grade 1 diastolic dysfunction. ? ?HTN ?Pt is currently taking lisinopril 40 mg daily, HCTZ 25 mg daily, metoprolol 100 mg daily and norvasc 10 mg daily with no complications. Although she is taking these medications, she does admit she is not always sure if she takes all of them daily. At home blood pressure readings are: not checked. Patient denies chest pain, SOB, blurred vision, dizziness, unusual headaches, lower leg swelling.  Denies excessive caffeine intake, stimulant usage, excessive alcohol intake, or increase in salt consumption. ? ?BP Readings from Last 3 Encounters:   ?07/12/21 (!) 180/90  ?05/31/21 120/80  ?02/18/21 (!) 150/90  ? ?CKD Stage 3 ?Due for updated blood work to re-evaluate this. Denies any current concerns. ? ?Past Medical History:  ?Diagnosis Date  ? Allergy   ? Arthritis   ? Cataract   ? Cervical cancer ([place])   ? GERD (gastroesophageal reflux disease)   ? Glaucoma 2017  ? History of echocardiogram   ? Echo 9/16:  Mild LVH, EF 55-60%, no RWMA, Gr 1 DD, mild MR // Echo 1/17:  Mild LVH, EF 50-55%, Gr 1 DD, mild to mod MR, mild LAE  ? Hypertension   ? Hyperthyroidism   ? s/p RAI treatment  ? Palpitations   ? PONV (postoperative nausea and vomiting)   ? years ago  . Woke up and saw asian people   ? Retinal detachment   ? L eye - partial blindness  ? Tobacco abuse   ? Tuberculosis   ? positive test as a caregiver cxr annually   ? Urinary incontinence   ? ?  ?Social History  ? ?Tobacco Use  ? Smoking status: Former  ?  Packs/day: 0.50  ?  Years: 48.00  ?  Pack years: 24.00  ?  Types: Cigarettes  ?  Start date: 11/30/1968  ?  Quit date: 10/31/2016  ?  Years since quitting: 4.6  ? Smokeless tobacco: Never  ? Tobacco comments:  ?  did not smoke more than .5 pack   ?Vaping Use  ? Vaping Use: Never used  ?Substance Use Topics  ? Alcohol  use: Not Currently  ?  Alcohol/week: 0.0 standard drinks  ?  Comment: rarely once a year  ? Drug use: No  ? ? ?Past Surgical History:  ?Procedure Laterality Date  ? ABDOMINAL HYSTERECTOMY  1984  ? bladder surgey for incontinence    ? EYE SURGERY    ? REPAIR OF COMPLEX TRACTION RETINAL DETACHMENT    ? thyroid radiation    ? TOTAL ABDOMINAL HYSTERECTOMY W/ BILATERAL SALPINGOOPHORECTOMY    ? TOTAL KNEE ARTHROPLASTY Right 04/22/2019  ? Procedure: RIGHT TOTAL KNEE ARTHROPLASTY;  Surgeon: Melrose Nakayama, MD;  Location: [facility];  Service: Orthopedics;  Laterality: Right;  PENDING PRE-OP FOR SDDC APPROVAL  ? ? ?Family History  ?Problem Relation Age of Onset  ? Hypertension Mother   ? Heart failure Mother   ? Diabetes Mother   ? Pancreatic cancer Father    ? Colon cancer Maternal Aunt   ? Cancer Sister   ? Thyroid disease Paternal Aunt   ? Cancer Paternal Aunt   ? Thyroid disease Other   ?     paternal side  ? Cancer Maternal Grandfather   ? Diabetes Paternal Grandmother   ? Diabetes Maternal Uncle   ? Colon polyps Neg Hx   ? Stomach cancer Neg Hx   ? ? ?Allergies  ?Allergen Reactions  ? Doxycycline Nausea Only  ? Codeine Nausea And Vomiting  ? Pollen Extract   ? ? ?Current Medications:  ? ?Current Outpatient Medications:  ?  acetaminophen (TYLENOL) 325 MG tablet, Take 650 mg by mouth every 4 (four) hours as needed., Disp: , Rfl:  ?  albuterol (PROVENTIL HFA) 108 (90 Base) MCG/ACT inhaler, Inhale 2 puffs into the lungs every 6 (six) hours as needed for wheezing or shortness of breath., Disp: 18 g, Rfl: 2 ?  amLODipine (NORVASC) 10 MG tablet, TAKE 1 TABLET BY MOUTH  DAILY, Disp: 90 tablet, Rfl: 3 ?  aspirin EC 81 MG tablet, Take 1 tablet (81 mg total) by mouth 2 (two) times daily after a meal., Disp: 30 tablet, Rfl: 0 ?  brimonidine (ALPHAGAN) 0.2 % ophthalmic solution, Place 1 drop into the right eye 2 (two) times daily., Disp: , Rfl:  ?  dorzolamide-timolol (COSOPT) 22.3-6.8 MG/ML ophthalmic solution, Place 1 drop into the right eye 2 (two) times daily., Disp: , Rfl:  ?  hydrochlorothiazide (HYDRODIURIL) 25 MG tablet, Take 1 tablet (25 mg total) by mouth daily., Disp: 90 tablet, Rfl: 3 ?  ketorolac (ACULAR) 0.4 % SOLN, Place 1 drop into the right eye 4 times daily., Disp: , Rfl:  ?  latanoprost (XALATAN) 0.005 % ophthalmic solution, Place 1 drop into both eyes at bedtime. As directed, Disp: , Rfl:  ?  levothyroxine (SYNTHROID) 175 MCG tablet, Take 1 tablet (175 mcg total) by mouth daily., Disp: 90 tablet, Rfl: 3 ?  lisinopril (ZESTRIL) 40 MG tablet, Take 1 tablet (40 mg total) by mouth daily., Disp: 90 tablet, Rfl: 3 ?  metoprolol succinate (TOPROL-XL) 100 MG 24 hr tablet, TAKE 1 TABLET BY MOUTH ONCE  DAILY ; TAKE WITH OR IMMEDIATELY FOLLOWING A MEAL, Disp: 90  tablet, Rfl: 1 ?  pantoprazole (PROTONIX) 20 MG tablet, Take 1 tablet (20 mg total) by mouth daily. Needs appointment or contact primary care physician for refills, Disp: 20 tablet, Rfl: 0  ? ?Review of Systems:  ? ?ROS ?Negative unless otherwise specified per HPI. ?Vitals:  ? ?Vitals:  ? 07/12/21 1201  ?BP: (!) 180/90  ?Pulse: 69  ?Temp: 98.5 ?  F (36.9 ?C)  ?SpO2: 97%  ?Weight: 223 lb 3.2 oz (101.2 kg)  ?Height: '5\' 7"'$  (1.702 m)  ?   ?Body mass index is 34.96 kg/m?. ? ?Physical Exam:  ? ?Physical Exam ?Vitals and nursing note reviewed.  ?Constitutional:   ?   General: She is not in acute distress. ?   Appearance: She is well-developed. She is not ill-appearing or toxic-appearing.  ?Cardiovascular:  ?   Rate and Rhythm: Normal rate and regular rhythm.  ?   Pulses: Normal pulses.  ?   Heart sounds: Normal heart sounds, S1 normal and S2 normal.  ?Pulmonary:  ?   Effort: Pulmonary effort is normal.  ?   Breath sounds: Wheezing present.  ?Skin: ?   General: Skin is warm and dry.  ?Neurological:  ?   Mental Status: She is alert.  ?   GCS: GCS eye subscore is 4. GCS verbal subscore is 5. GCS motor subscore is 6.  ?Psychiatric:     ?   Speech: Speech normal.     ?   Behavior: Behavior normal. Behavior is cooperative.  ? ? ?Assessment and Plan:  ? ?DOE (dyspnea on exertion) ?EKG tracing is personally reviewed.  EKG notes NSR.  No acute changes.  ?Uncontrolled ?Suspect possible underlying COPD ?Encouraged patient to follow-up with [facility] to discuss Low-Dose CT for Lung Cancer Screening ?Start Dulera inhaler -- 2 puffs in AM and 2 puffs in PM ?Albuterol nebulizer sent in as well as rx for neb machine ?Chest xray ordered ?Re-ordered cardiology referral ?Follow-up in 2 weeks, sooner if concerns ? ?Stage 3b chronic kidney disease ([place]) ?Update blood work and provide recommendations accordingly. ? ?Primary hypertension ?Above goal ?Long discussion with patient and daughter regarding this ?Provided medication list  today ?Recommend use of pill box ?Start checking blood pressure daily ?Follow-up with [place] in 2 weeks ?Re-ordered cardiology referral ? ?Ex-smoker ?Encouraged patient to follow-up with [facility] to discuss

## 2021-07-12 NOTE — Patient Instructions (Signed)
It was great to see you! ? ?For your breathing and heart --  ?--Please refer to your medication list and make sure you are taking the correct blood pressure medications every day. Use a pill box. ?--Check blood pressures and record daily for [place]. ?--Follow-up in 2 weeks on your blood pressure. ?--In the meantime, I would like to re-submit referral to cardiology for you to be evaluated ?--I'm going to ask the schedulers to reach out to you to discuss scheduling your: ? --Lung Cancer Low-Dose CT Yearly Screening ? --Calcium Score -- CT Scan of your heart to look at coronary arteries ?--Update blood work and chest xray at [facility] ?--Start Dulera inhaler twice in the morning and twice in the afternoon ?--Use Albuterol as needed, nebulizer machine ? ?An order for an xray has been put in for you. ?To get your xray, you can walk in at the [facility] location without a scheduled appointment.  ?The address is [address]. It is across the street from [facility]. ?X-ray is located in the basement.  ?Hours of operation are M-F 8:30am to 5:00pm. Please note that they are closed for lunch between 12:30 and 1:00pm. ? ? ?Let's follow-up WITH FAMILY MEMBER in  2 weeks, sooner if you have concerns. ? ?If a referral was placed today, you will be contacted for an appointment. Please note that routine referrals can sometimes take up to 3-4 weeks to process. Please call our office if you haven't heard anything after this time frame. ? ?Take care, ? ?Inda Coke PA-C  ?

## 2021-07-14 ENCOUNTER — Telehealth: Payer: Self-pay | Admitting: Physician Assistant

## 2021-07-14 ENCOUNTER — Inpatient Hospital Stay: Admission: RE | Admit: 2021-07-14 | Payer: Medicare Other | Source: Ambulatory Visit

## 2021-07-14 NOTE — Telephone Encounter (Signed)
PT states she has a missed call from [facility].  ? ?PT states she was not going to the Cardiac Scoring test today because she was in [place] and could not get there in time. ? ?Gave pt the number for [employer] at [facility] to reschedule. ? ?Please call pt. ?Questions about medication dosage: ?hydrochlorothiazide (HYDRODIURIL) 25 MG tablet [[id number]]  ? ?Next Appt 07/26/21 at [facility] ?

## 2021-07-14 NOTE — Telephone Encounter (Signed)
Spoke to pt told her was calling about results. See result notes. What question do you have about Hydrochlorothiazide? Pt said is she suppose to be taking it? Told pt yes, she should be taking HCTZ 25 mg daily, along with other blood pressure medications. Pt verbalized understanding. Also reminded pt to record blood pressure readings on the paper given and bring to next appt. Pt verbalized understanding. ?

## 2021-07-15 ENCOUNTER — Other Ambulatory Visit: Payer: Self-pay | Admitting: *Deleted

## 2021-07-15 MED ORDER — BUDESONIDE-FORMOTEROL FUMARATE 80-4.5 MCG/ACT IN AERO
2.0000 | INHALATION_SPRAY | Freq: Two times a day (BID) | RESPIRATORY_TRACT | 3 refills | Status: DC
Start: 2021-07-15 — End: 2022-12-21

## 2021-07-15 NOTE — Progress Notes (Signed)
Received fax from pharmacy requesting Prior Auth for [facility]. Samantha notified and she said she send in Symbicort 80-4.5 mg. Rx sent. ?

## 2021-07-18 DIAGNOSIS — E0590 Thyrotoxicosis, unspecified without thyrotoxic crisis or storm: Secondary | ICD-10-CM | POA: Insufficient documentation

## 2021-07-18 DIAGNOSIS — A159 Respiratory tuberculosis unspecified: Secondary | ICD-10-CM | POA: Insufficient documentation

## 2021-07-18 DIAGNOSIS — R002 Palpitations: Secondary | ICD-10-CM | POA: Insufficient documentation

## 2021-07-18 DIAGNOSIS — R32 Unspecified urinary incontinence: Secondary | ICD-10-CM | POA: Insufficient documentation

## 2021-07-18 DIAGNOSIS — Z9889 Other specified postprocedural states: Secondary | ICD-10-CM | POA: Insufficient documentation

## 2021-07-18 DIAGNOSIS — H269 Unspecified cataract: Secondary | ICD-10-CM | POA: Insufficient documentation

## 2021-07-18 DIAGNOSIS — Z9289 Personal history of other medical treatment: Secondary | ICD-10-CM | POA: Insufficient documentation

## 2021-07-18 DIAGNOSIS — K219 Gastro-esophageal reflux disease without esophagitis: Secondary | ICD-10-CM | POA: Insufficient documentation

## 2021-07-18 DIAGNOSIS — Z720 Tobacco use: Secondary | ICD-10-CM | POA: Insufficient documentation

## 2021-07-18 DIAGNOSIS — T7840XA Allergy, unspecified, initial encounter: Secondary | ICD-10-CM | POA: Insufficient documentation

## 2021-07-18 DIAGNOSIS — E079 Disorder of thyroid, unspecified: Secondary | ICD-10-CM | POA: Insufficient documentation

## 2021-07-18 DIAGNOSIS — M1990 Unspecified osteoarthritis, unspecified site: Secondary | ICD-10-CM | POA: Insufficient documentation

## 2021-07-18 DIAGNOSIS — H3320 Serous retinal detachment, unspecified eye: Secondary | ICD-10-CM | POA: Insufficient documentation

## 2021-07-18 DIAGNOSIS — C539 Malignant neoplasm of cervix uteri, unspecified: Secondary | ICD-10-CM | POA: Insufficient documentation

## 2021-07-18 HISTORY — DX: Other specified postprocedural states: Z98.890

## 2021-07-18 NOTE — Progress Notes (Signed)
?Cardiology Office Note:   ? ?Date:  07/19/2021  ? ?ID:  [name], DOB 28-Nov-1950, MRN [id number] ? ?PCP:  Inda Coke, PA  ?Cardiologist:  Shirlee More, MD   ? ?Referring MD: Inda Coke, PA  ? ? ?ASSESSMENT:   ? ?1. Primary hypertension   ? ?PLAN:   ? ?In order of problems listed above: ? ?I think in retrospect her blood pressure is poorly controlled when she was not taking a diuretic she has better appears to be at target or close to target with a combination of calcium channel blocker thiazide diuretic and lisinopril.  Is very difficult to judge blood pressure control in the office with marked variability and we discussed good technique for recording of home and I think those numbers are better marked with a goal systolic in the range of 333 or less. ?Initially seen with an abnormal EKG I will recheck 1 before she leaves the office.  Continues to have LVH T wave abnormality no significant alteration from previous I do not think she needs to repeat all of her testing again ? ? ?Next appointment: We will plan to see her back as needed ? ? ?Medication Adjustments/Labs and Tests Ordered: ?Current medicines are reviewed at length with the patient today.  Concerns regarding medicines are outlined above.  ?No orders of the defined types were placed in this encounter. ? ?No orders of the defined types were placed in this encounter. ? ? ?Chief Complaint  ?Patient presents with  ? Hypertension  ?  I was out of my diuretic for a few months and a blood pressure was running greater than 545 systolic  ? ? ?History of Present Illness:   ? ?Kari Coleman is a 71 y.o. female with a hx of hypertensive heart disease with LVH on EKG last seen 04/15/2019 and preoperative evaluation. ? ?She was seen by my partner 04/25/2018 complaints of chest pain and palpitation.  ? ?Echocardiogram 11/23/2017 shows normal left ventricular size and function mild LVH and mild mitral regurgitation. ? ?Following that visit a Holter monitor  was performed 48 hours which was described as unremarkable except for rare brief runs of atrial premature contractions.   ? ?A myocardial perfusion study was performed 04/30/2018 there is no ischemic ST abnormality during the test left ventricular function was normal ejection fraction 63% and myocardial perfusion was normal, this was a normal test ? ?Compliance with diet, lifestyle and medications: Yes ? ?Her daughters become involved in her care which has been helpful for compliance ?Restarting her diuretic her blood pressure runs more in the range of 1 62-5 40 systolic at home ?She is not using good technique and I will give her instructions check her blood pressure midday resting 10 minutes seated legs uncrossed check and record ?She is fatigued by her recent eye surgery ?No edema shortness of breath chest pain palpitation or syncope ?Past Medical History:  ?Diagnosis Date  ? Allergy   ? Arthritis   ? Cataract   ? Cervical cancer ([place])   ? GERD (gastroesophageal reflux disease)   ? Glaucoma 2017  ? History of echocardiogram   ? Echo 9/16:  Mild LVH, EF 55-60%, no RWMA, Gr 1 DD, mild MR // Echo 1/17:  Mild LVH, EF 50-55%, Gr 1 DD, mild to mod MR, mild LAE  ? Hypertension   ? Hyperthyroidism   ? s/p RAI treatment  ? Palpitations   ? PONV (postoperative nausea and vomiting)   ? years ago  .  Woke up and saw asian people   ? PONV (postoperative nausea and vomiting) 07/18/2021  ? Retinal detachment   ? L eye - partial blindness  ? Tobacco abuse   ? Tuberculosis   ? positive test as a caregiver cxr annually   ? Urinary incontinence   ? ? ?Past Surgical History:  ?Procedure Laterality Date  ? ABDOMINAL HYSTERECTOMY  1984  ? bladder surgey for incontinence    ? EYE SURGERY    ? REPAIR OF COMPLEX TRACTION RETINAL DETACHMENT    ? thyroid radiation    ? TOTAL ABDOMINAL HYSTERECTOMY W/ BILATERAL SALPINGOOPHORECTOMY    ? TOTAL KNEE ARTHROPLASTY Right 04/22/2019  ? Procedure: RIGHT TOTAL KNEE ARTHROPLASTY;  Surgeon: Melrose Nakayama, MD;  Location: [facility];  Service: Orthopedics;  Laterality: Right;  PENDING PRE-OP FOR SDDC APPROVAL  ? ? ?Current Medications: ?Current Meds  ?Medication Sig  ? acetaminophen (TYLENOL) 325 MG tablet Take 650 mg by mouth every 4 (four) hours as needed.  ? albuterol (PROVENTIL HFA) 108 (90 Base) MCG/ACT inhaler Inhale 2 puffs into the lungs every 6 (six) hours as needed for wheezing or shortness of breath.  ? albuterol (PROVENTIL) (2.5 MG/3ML) 0.083% nebulizer solution Take 3 mLs (2.5 mg total) by nebulization every 6 (six) hours as needed for wheezing or shortness of breath.  ? amLODipine (NORVASC) 10 MG tablet TAKE 1 TABLET BY MOUTH  DAILY  ? aspirin EC 81 MG tablet Take 1 tablet (81 mg total) by mouth 2 (two) times daily after a meal.  ? brimonidine (ALPHAGAN) 0.2 % ophthalmic solution Place 1 drop into the right eye 2 (two) times daily.  ? budesonide-formoterol (SYMBICORT) 80-4.5 MCG/ACT inhaler Inhale 2 puffs into the lungs 2 (two) times daily.  ? dorzolamide-timolol (COSOPT) 22.3-6.8 MG/ML ophthalmic solution Place 1 drop into the right eye 2 (two) times daily.  ? hydrochlorothiazide (HYDRODIURIL) 25 MG tablet Take 1 tablet (25 mg total) by mouth daily.  ? ketorolac (ACULAR) 0.4 % SOLN Place 1 drop into the right eye 4 times daily.  ? latanoprost (XALATAN) 0.005 % ophthalmic solution Place 1 drop into both eyes at bedtime. As directed  ? levothyroxine (SYNTHROID) 175 MCG tablet Take 1 tablet (175 mcg total) by mouth daily.  ? lisinopril (ZESTRIL) 40 MG tablet Take 1 tablet (40 mg total) by mouth daily.  ? metoprolol succinate (TOPROL-XL) 100 MG 24 hr tablet TAKE 1 TABLET BY MOUTH ONCE  DAILY ; TAKE WITH OR IMMEDIATELY FOLLOWING A MEAL  ? pantoprazole (PROTONIX) 20 MG tablet Take 1 tablet (20 mg total) by mouth daily. Needs appointment or contact primary care physician for refills  ?  ? ?Allergies:   Doxycycline, Codeine, and Pollen extract  ? ?Social History  ? ?Socioeconomic History  ? Marital status:  Single  ?  Spouse name: Not on file  ? Number of children: 2  ? Years of education: Not on file  ? Highest education level: Not on file  ?Occupational History  ? 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 Occupation: Retired  ?Tobacco Use  ? Smoking status: Former  ?  Packs/day: 0.50  ?  Years: 48.00  ?  Pack years: 24.00  ?  Types: Cigarettes  ?  Start date: 11/30/1968  ?  Quit date: 10/31/2016  ?  Years since quitting: 4.7  ? Smokeless tobacco: Never  ? Tobacco comments:  ?  did not smoke more than .5 pack   ?Vaping Use  ?  Vaping Use: Never used  ?Substance and Sexual Activity  ? Alcohol use: Not Currently  ?  Alcohol/week: 0.0 standard drinks  ?  Comment: rarely once a year  ? Drug use: No  ? Sexual activity: Not Currently  ?Other Topics Concern  ? Not on file  ?Social History Narrative  ? Caregiver  ? Daughter and grandson in household  ? ?Social Determinants of Health  ? ?Financial Resource Strain: Low Risk   ? Difficulty of Paying Living Expenses: Not hard at all  ?Food Insecurity: No Food Insecurity  ? Worried About Charity fundraiser in the Last Year: Never true  ? Ran Out of Food in the Last Year: Never true  ?Transportation Needs: No Transportation Needs  ? Lack of Transportation (Medical): No  ? Lack of Transportation (Non-Medical): No  ?Physical Activity: Inactive  ? Days of Exercise per Week: 0 days  ? Minutes of Exercise per Session: 0 min  ?Stress: No Stress Concern Present  ? Feeling of Stress : Only a little  ?Social Connections: Unknown  ? Frequency of Communication with Friends and Family: More than three times a week  ? Frequency of Social Gatherings with Friends and Family: Twice a week  ? Attends Religious Services: 1 to 4 times per year  ? Active Member of Clubs or Organizations: No  ? Attends Archivist Meetings: Never  ? Marital Status: Not on file  ?  ? ?Family History: ?The patient's family history includes Cancer in her maternal grandfather, paternal aunt, and sister; Colon  cancer in her maternal aunt; Diabetes in her maternal uncle, mother, and paternal grandmother; Heart failure in her mother; Hypertension in her mother; Pancreatic cancer in her father; Thyroid disease in

## 2021-07-19 ENCOUNTER — Encounter: Payer: Self-pay | Admitting: Cardiology

## 2021-07-19 ENCOUNTER — Ambulatory Visit (INDEPENDENT_AMBULATORY_CARE_PROVIDER_SITE_OTHER): Payer: Medicare Other | Admitting: Cardiology

## 2021-07-19 VITALS — BP 130/70 | HR 60 | Ht 67.0 in | Wt 221.0 lb

## 2021-07-19 DIAGNOSIS — I10 Essential (primary) hypertension: Secondary | ICD-10-CM

## 2021-07-19 DIAGNOSIS — R9431 Abnormal electrocardiogram [ECG] [EKG]: Secondary | ICD-10-CM

## 2021-07-19 NOTE — Patient Instructions (Addendum)
Medication Instructions:  ?Your physician recommends that you continue on your current medications as directed. Please refer to the Current Medication list given to you today. ? ?*If you need a refill on your cardiac medications before your next appointment, please call your pharmacy* ? ? ?Lab Work: ?None ?If you have labs (blood work) drawn today and your tests are completely normal, you will receive your results only by: ?MyChart Message (if you have MyChart) OR ?A paper copy in the mail ?If you have any lab test that is abnormal or we need to change your treatment, we will call you to review the results. ? ? ?Testing/Procedures: ?None ? ? ?Follow-Up: ?At [facility], you and your health needs are our priority.  As part of our continuing mission to provide you with exceptional heart care, we have created designated Provider Care Teams.  These Care Teams include your primary Cardiologist (physician) and Advanced Practice Providers (APPs -  Physician Assistants and Nurse Practitioners) who all work together to provide you with the care you need, when you need it. ? ?We recommend signing up for the patient portal called "MyChart".  Sign up information is provided on this After Visit Summary.  MyChart is used to connect with patients for Virtual Visits (Telemedicine).  Patients are able to view lab/test results, encounter notes, upcoming appointments, etc.  Non-urgent messages can be sent to your provider as well.   ?To learn more about what you can do with MyChart, go to [email or web address].   ? ?Your next appointment:   ?Follow up as needed ? ?The format for your next appointment:   ?In Person ? ?Provider:   ?Shirlee More, MD{ ? ? ?Other Instructions: ? ?Check and record blood pressures daily ? ?Important Information About Sugar ? ? ? ? ? ? ? ? ?Healthbeat  ?Tips to measure your blood pressure correctly ? ?To determine whether you have hypertension, a medical professional will take a blood pressure reading.  How you prepare for the test, the position of your arm, and other factors can change a blood pressure reading by 10% or more. That could be enough to hide high blood pressure, start you on a drug you don't really need, or lead your doctor to incorrectly adjust your medications. ?National and international guidelines offer specific instructions for measuring blood pressure. If a doctor, nurse, or medical assistant isn't doing it right, don't hesitate to ask him or her to get with the guidelines. ?Here's what you can do to ensure a correct reading: ? Don't drink a caffeinated beverage or smoke during the 30 minutes before the test. ? Sit quietly for five minutes before the test begins. ? During the measurement, sit in a chair with your feet on the floor and your arm supported so your elbow is at about heart level. ? The inflatable part of the cuff should completely cover at least 80% of your upper arm, and the cuff should be placed on bare skin, not over a shirt. ? Don't talk during the measurement. ? Have your blood pressure measured twice, with a brief break in between. If the readings are different by 5 points or more, have it done a third time. ?There are times to break these rules. If you sometimes feel lightheaded when getting out of bed in the morning or when you stand after sitting, you should have your blood pressure checked while seated and then while standing to see if it falls from one position to the next. ?Because blood pressure  varies throughout the day, your doctor will rarely diagnose hypertension on the basis of a single reading. Instead, he or she will want to confirm the measurements on at least two occasions, usually within a few weeks of one another. The exception to this rule is if you have a blood pressure reading of 180/110 mm Hg or higher. A result this high usually calls for prompt treatment. ?It's also a good idea to have your blood pressure measured in both arms at least once, since the  reading in one arm (usually the right) may be higher than that in the left. A 2014 study in The American Journal of Medicine of nearly 3,400 people found average arm- to-arm differences in systolic blood pressure of about 5 points. The higher number should be used to make treatment decisions. ?In 2017, new guidelines from the [employer], the [employer] of Cardiology, and nine other health organizations lowered the diagnosis of high blood pressure to 130/80 mm Hg or higher for all adults. The guidelines also redefined the various blood pressure categories to now include normal, elevated, Stage 1 hypertension, Stage 2 hypertension, and hypertensive crisis (see "Blood pressure categories"). ?Blood pressure categories  ?Blood pressure category SYSTOLIC ?(upper number)  DIASTOLIC ?(lower number)  ?Normal Less than 120 mm Hg and Less than 80 mm Hg  ?Elevated 120-129 mm Hg and Less than 80 mm Hg  ?High blood pressure: Stage 1 hypertension 130-139 mm Hg or 80-89 mm Hg  ?High blood pressure: Stage 2 hypertension 140 mm Hg or higher or 90 mm Hg or higher  ?Hypertensive crisis (consult your doctor immediately) Higher than 180 mm Hg and/or Higher than 120 mm Hg  ?Source: American Heart Association and American Stroke Association. ?For more on getting your blood pressure under control, buy Controlling Your Blood Pressure, a Special Health Report from [facility].  ?

## 2021-07-25 NOTE — Progress Notes (Incomplete)
Kari Coleman is a 71 y.o. female here for a follow up of a pre-existing problem. ? ?SCRIBE STATEMENT ? ?History of Present Illness:  ? ?No chief complaint on file. ? ? ?HPI ?HTN ?Pt is currently taking lisinopril 40 mg daily, HCTZ 25 mg daily, metoprolol 100 mg daily and norvasc 10 mg daily with no complications. Although she is taking these medications, she doesAt home blood pressure readings are: ***. Patient denies chest pain, SOB, blurred vision, dizziness, unusual headaches, lower leg swelling. Denies excessive caffeine intake, stimulant usage, excessive alcohol intake, or increase in salt consumption. ? ?BP Readings from Last 3 Encounters:  ?07/19/21 130/70  ?07/12/21 (!) 180/90  ?05/31/21 120/80  ? ?Dyspnea on Exertion  ? ?Past Medical History:  ?Diagnosis Date  ? Allergy   ? Arthritis   ? Cataract   ? Cervical cancer ([place])   ? GERD (gastroesophageal reflux disease)   ? Glaucoma 2017  ? History of echocardiogram   ? Echo 9/16:  Mild LVH, EF 55-60%, no RWMA, Gr 1 DD, mild MR // Echo 1/17:  Mild LVH, EF 50-55%, Gr 1 DD, mild to mod MR, mild LAE  ? Hypertension   ? Hyperthyroidism   ? s/p RAI treatment  ? Palpitations   ? PONV (postoperative nausea and vomiting)   ? years ago  . Woke up and saw asian people   ? PONV (postoperative nausea and vomiting) 07/18/2021  ? Retinal detachment   ? L eye - partial blindness  ? Tobacco abuse   ? Tuberculosis   ? positive test as a caregiver cxr annually   ? Urinary incontinence   ? ?  ?Social History  ? ?Tobacco Use  ? Smoking status: Former  ?  Packs/day: 0.50  ?  Years: 48.00  ?  Pack years: 24.00  ?  Types: Cigarettes  ?  Start date: 11/30/1968  ?  Quit date: 10/31/2016  ?  Years since quitting: 4.7  ? Smokeless tobacco: Never  ? Tobacco comments:  ?  did not smoke more than .5 pack   ?Vaping Use  ? Vaping Use: Never used  ?Substance Use Topics  ? Alcohol use: Not Currently  ?  Alcohol/week: 0.0 standard drinks  ?  Comment: rarely once a year  ? Drug use: No  ? ? ?Past  Surgical History:  ?Procedure Laterality Date  ? ABDOMINAL HYSTERECTOMY  1984  ? bladder surgey for incontinence    ? EYE SURGERY    ? REPAIR OF COMPLEX TRACTION RETINAL DETACHMENT    ? thyroid radiation    ? TOTAL ABDOMINAL HYSTERECTOMY W/ BILATERAL SALPINGOOPHORECTOMY    ? TOTAL KNEE ARTHROPLASTY Right 04/22/2019  ? Procedure: RIGHT TOTAL KNEE ARTHROPLASTY;  Surgeon: Melrose Nakayama, MD;  Location: [facility];  Service: Orthopedics;  Laterality: Right;  PENDING PRE-OP FOR SDDC APPROVAL  ? ? ?Family History  ?Problem Relation Age of Onset  ? Hypertension Mother   ? Heart failure Mother   ? Diabetes Mother   ? Pancreatic cancer Father   ? Colon cancer Maternal Aunt   ? Cancer Sister   ? Thyroid disease Paternal Aunt   ? Cancer Paternal Aunt   ? Thyroid disease Other   ?     paternal side  ? Cancer Maternal Grandfather   ? Diabetes Paternal Grandmother   ? Diabetes Maternal Uncle   ? Colon polyps Neg Hx   ? Stomach cancer Neg Hx   ? ? ?Allergies  ?Allergen Reactions  ?  Doxycycline Nausea Only  ? Codeine Nausea And Vomiting  ? Pollen Extract   ? ? ?Current Medications:  ? ?Current Outpatient Medications:  ?  acetaminophen (TYLENOL) 325 MG tablet, Take 650 mg by mouth every 4 (four) hours as needed., Disp: , Rfl:  ?  albuterol (PROVENTIL HFA) 108 (90 Base) MCG/ACT inhaler, Inhale 2 puffs into the lungs every 6 (six) hours as needed for wheezing or shortness of breath., Disp: 18 g, Rfl: 2 ?  albuterol (PROVENTIL) (2.5 MG/3ML) 0.083% nebulizer solution, Take 3 mLs (2.5 mg total) by nebulization every 6 (six) hours as needed for wheezing or shortness of breath., Disp: 150 mL, Rfl: 1 ?  amLODipine (NORVASC) 10 MG tablet, TAKE 1 TABLET BY MOUTH  DAILY, Disp: 90 tablet, Rfl: 3 ?  aspirin EC 81 MG tablet, Take 1 tablet (81 mg total) by mouth 2 (two) times daily after a meal., Disp: 30 tablet, Rfl: 0 ?  brimonidine (ALPHAGAN) 0.2 % ophthalmic solution, Place 1 drop into the right eye 2 (two) times daily., Disp: , Rfl:  ?   budesonide-formoterol (SYMBICORT) 80-4.5 MCG/ACT inhaler, Inhale 2 puffs into the lungs 2 (two) times daily., Disp: 1 each, Rfl: 3 ?  dorzolamide-timolol (COSOPT) 22.3-6.8 MG/ML ophthalmic solution, Place 1 drop into the right eye 2 (two) times daily., Disp: , Rfl:  ?  hydrochlorothiazide (HYDRODIURIL) 25 MG tablet, Take 1 tablet (25 mg total) by mouth daily., Disp: 90 tablet, Rfl: 3 ?  ketorolac (ACULAR) 0.4 % SOLN, Place 1 drop into the right eye 4 times daily., Disp: , Rfl:  ?  latanoprost (XALATAN) 0.005 % ophthalmic solution, Place 1 drop into both eyes at bedtime. As directed, Disp: , Rfl:  ?  levothyroxine (SYNTHROID) 175 MCG tablet, Take 1 tablet (175 mcg total) by mouth daily., Disp: 90 tablet, Rfl: 3 ?  lisinopril (ZESTRIL) 40 MG tablet, Take 1 tablet (40 mg total) by mouth daily., Disp: 90 tablet, Rfl: 3 ?  metoprolol succinate (TOPROL-XL) 100 MG 24 hr tablet, TAKE 1 TABLET BY MOUTH ONCE  DAILY ; TAKE WITH OR IMMEDIATELY FOLLOWING A MEAL, Disp: 90 tablet, Rfl: 1 ?  pantoprazole (PROTONIX) 20 MG tablet, Take 1 tablet (20 mg total) by mouth daily. Needs appointment or contact primary care physician for refills, Disp: 20 tablet, Rfl: 0  ? ?Review of Systems:  ? ?ROS ? ?Vitals:  ? ?There were no vitals filed for this visit.   ?There is no height or weight on file to calculate BMI. ? ?Physical Exam:  ? ?Physical Exam ? ?Assessment and Plan:  ? ?'@DIAGLIST'$ @ ? ? ? ? ?*** ? ?Inda Coke, PA-C ? ?

## 2021-07-26 ENCOUNTER — Ambulatory Visit: Payer: Medicare Other | Admitting: Physician Assistant

## 2021-08-12 ENCOUNTER — Other Ambulatory Visit (INDEPENDENT_AMBULATORY_CARE_PROVIDER_SITE_OTHER): Payer: Medicare Other

## 2021-08-12 ENCOUNTER — Other Ambulatory Visit: Payer: Self-pay

## 2021-08-12 DIAGNOSIS — I10 Essential (primary) hypertension: Secondary | ICD-10-CM | POA: Diagnosis not present

## 2021-08-12 DIAGNOSIS — N1832 Chronic kidney disease, stage 3b: Secondary | ICD-10-CM | POA: Diagnosis not present

## 2021-08-12 LAB — CBC WITH DIFFERENTIAL/PLATELET
Basophils Absolute: 0 10*3/uL (ref 0.0–0.1)
Basophils Relative: 0.8 % (ref 0.0–3.0)
Eosinophils Absolute: 0.2 10*3/uL (ref 0.0–0.7)
Eosinophils Relative: 3.7 % (ref 0.0–5.0)
HCT: 41.1 % (ref 36.0–46.0)
Hemoglobin: 13.5 g/dL (ref 12.0–15.0)
Lymphocytes Relative: 40.2 % (ref 12.0–46.0)
Lymphs Abs: 2.4 10*3/uL (ref 0.7–4.0)
MCHC: 32.9 g/dL (ref 30.0–36.0)
MCV: 90.5 fl (ref 78.0–100.0)
Monocytes Absolute: 0.3 10*3/uL (ref 0.1–1.0)
Monocytes Relative: 5.2 % (ref 3.0–12.0)
Neutro Abs: 2.9 10*3/uL (ref 1.4–7.7)
Neutrophils Relative %: 50.1 % (ref 43.0–77.0)
Platelets: 241 10*3/uL (ref 150.0–400.0)
RBC: 4.54 Mil/uL (ref 3.87–5.11)
RDW: 12.9 % (ref 11.5–15.5)
WBC: 5.9 10*3/uL (ref 4.0–10.5)

## 2021-08-12 LAB — COMPREHENSIVE METABOLIC PANEL
ALT: 21 U/L (ref 0–35)
AST: 18 U/L (ref 0–37)
Albumin: 4.3 g/dL (ref 3.5–5.2)
Alkaline Phosphatase: 81 U/L (ref 39–117)
BUN: 25 mg/dL — ABNORMAL HIGH (ref 6–23)
CO2: 29 mEq/L (ref 19–32)
Calcium: 10.3 mg/dL (ref 8.4–10.5)
Chloride: 101 mEq/L (ref 96–112)
Creatinine, Ser: 1.2 mg/dL (ref 0.40–1.20)
GFR: 45.1 mL/min — ABNORMAL LOW (ref >60.00)
Glucose, Bld: 125 mg/dL — ABNORMAL HIGH (ref 70–99)
Potassium: 4 mEq/L (ref 3.5–5.1)
Sodium: 137 mEq/L (ref 135–145)
Total Bilirubin: 0.5 mg/dL (ref 0.2–1.2)
Total Protein: 8.2 g/dL (ref 6.0–8.3)

## 2021-08-12 LAB — TSH: TSH: 0.99 u[IU]/mL (ref 0.35–5.50)

## 2021-08-16 ENCOUNTER — Other Ambulatory Visit: Payer: Self-pay | Admitting: Cardiology

## 2021-08-23 ENCOUNTER — Other Ambulatory Visit: Payer: Self-pay | Admitting: *Deleted

## 2021-08-26 ENCOUNTER — Ambulatory Visit
Admission: RE | Admit: 2021-08-26 | Discharge: 2021-08-26 | Disposition: A | Discharge status: Home / Self Care | Payer: Self-pay | Source: Ambulatory Visit | Attending: Physician Assistant | Admitting: Physician Assistant

## 2021-08-26 DIAGNOSIS — R0609 Other forms of dyspnea: Secondary | ICD-10-CM

## 2021-08-31 ENCOUNTER — Other Ambulatory Visit: Payer: Self-pay | Admitting: *Deleted

## 2021-08-31 MED ORDER — PRAVASTATIN SODIUM 10 MG PO TABS
10.0000 mg | ORAL_TABLET | Freq: Every day | ORAL | 1 refills | Status: DC
Start: 2021-08-31 — End: 2022-02-13

## 2021-09-21 ENCOUNTER — Other Ambulatory Visit: Payer: Self-pay | Admitting: Cardiology

## 2021-10-07 ENCOUNTER — Ambulatory Visit: Payer: Medicare Other | Admitting: Physician Assistant

## 2021-10-07 NOTE — Progress Notes (Incomplete)
Kari Coleman is a 71 y.o. female here for a follow up of a pre-existing problem.  SCRIBE STATEMENT  History of Present Illness:   No chief complaint on file.   HPI  Urinary Frequency   Past Medical History:  Diagnosis Date   Allergy    Arthritis    Cataract    Cervical cancer ([place])    GERD (gastroesophageal reflux disease)    Glaucoma 2017   History of echocardiogram    Echo 9/16:  Mild LVH, EF 55-60%, no RWMA, Gr 1 DD, mild MR // Echo 1/17:  Mild LVH, EF 50-55%, Gr 1 DD, mild to mod MR, mild LAE   Hypertension    Hyperthyroidism    s/p RAI treatment   Palpitations    PONV (postoperative nausea and vomiting)    years ago  . Woke up and saw asian people    PONV (postoperative nausea and vomiting) 07/18/2021   Retinal detachment    L eye - partial blindness   Tobacco abuse    Tuberculosis    positive test as a caregiver cxr annually    Urinary incontinence      Social History   Tobacco Use   Smoking status: Former    Packs/day: 0.50    Years: 48.00    Total pack years: 24.00    Types: Cigarettes    Start date: 11/30/1968    Quit date: 10/31/2016    Years since quitting: 4.9   Smokeless tobacco: Never   Tobacco comments:    did not smoke more than .5 pack   Vaping Use   Vaping Use: Never used  Substance Use Topics   Alcohol use: Not Currently    Alcohol/week: 0.0 standard drinks of alcohol    Comment: rarely once a year   Drug use: No    Past Surgical History:  Procedure Laterality Date   ABDOMINAL HYSTERECTOMY  1984   bladder surgey for incontinence     EYE SURGERY     REPAIR OF COMPLEX TRACTION RETINAL DETACHMENT     thyroid radiation     TOTAL ABDOMINAL HYSTERECTOMY W/ BILATERAL SALPINGOOPHORECTOMY     TOTAL KNEE ARTHROPLASTY Right 04/22/2019   Procedure: RIGHT TOTAL KNEE ARTHROPLASTY;  Surgeon: Melrose Nakayama, MD;  Location: [facility];  Service: Orthopedics;  Laterality: Right;  PENDING PRE-OP FOR SDDC APPROVAL    Family History  Problem Relation  Age of Onset   Hypertension Mother    Heart failure Mother    Diabetes Mother    Pancreatic cancer Father    Colon cancer Maternal Aunt    Cancer Sister    Thyroid disease Paternal Aunt    Cancer Paternal Aunt    Thyroid disease Other        paternal side   Cancer Maternal Grandfather    Diabetes Paternal Grandmother    Diabetes Maternal Uncle    Colon polyps Neg Hx    Stomach cancer Neg Hx     Allergies  Allergen Reactions   Doxycycline Nausea Only   Codeine Nausea And Vomiting   Pollen Extract     Current Medications:   Current Outpatient Medications:    acetaminophen (TYLENOL) 325 MG tablet, Take 650 mg by mouth every 4 (four) hours as needed., Disp: , Rfl:    albuterol (PROVENTIL HFA) 108 (90 Base) MCG/ACT inhaler, Inhale 2 puffs into the lungs every 6 (six) hours as needed for wheezing or shortness of breath., Disp: 18 g, Rfl: 2  albuterol (PROVENTIL) (2.5 MG/3ML) 0.083% nebulizer solution, Take 3 mLs (2.5 mg total) by nebulization every 6 (six) hours as needed for wheezing or shortness of breath., Disp: 150 mL, Rfl: 1   amLODipine (NORVASC) 10 MG tablet, TAKE 1 TABLET BY MOUTH  DAILY, Disp: 90 tablet, Rfl: 3   aspirin EC 81 MG tablet, Take 1 tablet (81 mg total) by mouth 2 (two) times daily after a meal., Disp: 30 tablet, Rfl: 0   brimonidine (ALPHAGAN) 0.2 % ophthalmic solution, Place 1 drop into the right eye 2 (two) times daily., Disp: , Rfl:    budesonide-formoterol (SYMBICORT) 80-4.5 MCG/ACT inhaler, Inhale 2 puffs into the lungs 2 (two) times daily., Disp: 1 each, Rfl: 3   dorzolamide-timolol (COSOPT) 22.3-6.8 MG/ML ophthalmic solution, Place 1 drop into the right eye 2 (two) times daily., Disp: , Rfl:    hydrochlorothiazide (HYDRODIURIL) 25 MG tablet, Take 1 tablet (25 mg total) by mouth daily., Disp: 90 tablet, Rfl: 3   ketorolac (ACULAR) 0.4 % SOLN, Place 1 drop into the right eye 4 times daily., Disp: , Rfl:    latanoprost (XALATAN) 0.005 % ophthalmic solution,  Place 1 drop into both eyes at bedtime. As directed, Disp: , Rfl:    levothyroxine (SYNTHROID) 175 MCG tablet, Take 1 tablet (175 mcg total) by mouth daily., Disp: 90 tablet, Rfl: 3   lisinopril (ZESTRIL) 40 MG tablet, Take 1 tablet (40 mg total) by mouth daily., Disp: 90 tablet, Rfl: 3   metoprolol succinate (TOPROL-XL) 100 MG 24 hr tablet, TAKE 1 TABLET BY MOUTH ONCE  DAILY ; TAKE WITH OR IMMEDIATELY FOLLOWING A MEAL, Disp: 90 tablet, Rfl: 1   pantoprazole (PROTONIX) 20 MG tablet, TAKE 1 TABLET BY MOUTH  DAILY, Disp: 20 tablet, Rfl: 0   pravastatin (PRAVACHOL) 10 MG tablet, Take 1 tablet (10 mg total) by mouth daily., Disp: 90 tablet, Rfl: 1   prednisoLONE acetate (PRED FORTE) 1 % ophthalmic suspension, Place 1 drop into the right eye 3 times daily., Disp: , Rfl:    Review of Systems:   ROS Negative unless otherwise specified per HPI.   Vitals:   There were no vitals filed for this visit.   There is no height or weight on file to calculate BMI.  Physical Exam:   Physical Exam  Assessment and Plan:   '@DIAGLIST'$ @    I,Savera Zaman,acting as a scribe for [employer], PA.,have documented all relevant documentation on the behalf of Inda Coke, PA,as directed by  Inda Coke, PA while in the presence of Inda Coke, [place].   ***  Inda Coke, PA-C

## 2021-10-13 ENCOUNTER — Telehealth: Payer: Self-pay | Admitting: Physician Assistant

## 2021-10-13 NOTE — Telephone Encounter (Signed)
FYI  Patient called stating she was looking for a new endocrinologist since her former provider, Dr. Loanne Drilling is retiring. I provided her with information to [facility] endocrinology and [facility] since they have the shortest wait times for new patients. I recommended patient call these offices to see if they are taking new patients as well as her insurance. Upon doing this and finding which one she would like, she would like a referral placed.

## 2021-10-13 NOTE — Telephone Encounter (Signed)
FYI. See note below.  

## 2021-11-17 ENCOUNTER — Emergency Department (HOSPITAL_COMMUNITY)
Admission: EM | Admit: 2021-11-17 | Discharge: 2021-11-17 | Disposition: A | Discharge status: Home / Self Care | Payer: Medicare Other | Attending: Emergency Medicine | Admitting: Emergency Medicine

## 2021-11-17 ENCOUNTER — Emergency Department (HOSPITAL_COMMUNITY): Payer: Medicare Other

## 2021-11-17 DIAGNOSIS — R531 Weakness: Secondary | ICD-10-CM | POA: Diagnosis not present

## 2021-11-17 DIAGNOSIS — Z79899 Other long term (current) drug therapy: Secondary | ICD-10-CM | POA: Diagnosis not present

## 2021-11-17 DIAGNOSIS — U071 COVID-19: Secondary | ICD-10-CM | POA: Diagnosis not present

## 2021-11-17 DIAGNOSIS — I10 Essential (primary) hypertension: Secondary | ICD-10-CM | POA: Diagnosis not present

## 2021-11-17 DIAGNOSIS — R059 Cough, unspecified: Secondary | ICD-10-CM | POA: Diagnosis present

## 2021-11-17 DIAGNOSIS — Z8541 Personal history of malignant neoplasm of cervix uteri: Secondary | ICD-10-CM | POA: Insufficient documentation

## 2021-11-17 DIAGNOSIS — Z7982 Long term (current) use of aspirin: Secondary | ICD-10-CM | POA: Insufficient documentation

## 2021-11-17 DIAGNOSIS — R051 Acute cough: Secondary | ICD-10-CM

## 2021-11-17 LAB — CBC
HCT: 38.2 % (ref 36.0–46.0)
Hemoglobin: 12.8 g/dL (ref 12.0–15.0)
MCH: 30.3 pg (ref 26.0–34.0)
MCHC: 33.5 g/dL (ref 30.0–36.0)
MCV: 90.5 fL (ref 80.0–100.0)
Platelets: 226 10*3/uL (ref 150–400)
RBC: 4.22 MIL/uL (ref 3.87–5.11)
RDW: 12.7 % (ref 11.5–15.5)
WBC: 7.3 10*3/uL (ref 4.0–10.5)
nRBC: 0 % (ref 0.0–0.2)

## 2021-11-17 LAB — BASIC METABOLIC PANEL
Anion gap: 9 (ref 5–15)
BUN: 20 mg/dL (ref 8–23)
CO2: 25 mmol/L (ref 22–32)
Calcium: 9.4 mg/dL (ref 8.9–10.3)
Chloride: 102 mmol/L (ref 98–111)
Creatinine, Ser: 1.41 mg/dL — ABNORMAL HIGH (ref 0.44–1.00)
GFR, Estimated: 39 mL/min — ABNORMAL LOW (ref >60)
Glucose, Bld: 134 mg/dL — ABNORMAL HIGH (ref 70–99)
Potassium: 3.7 mmol/L (ref 3.5–5.1)
Sodium: 136 mmol/L (ref 135–145)

## 2021-11-17 MED ORDER — ACETAMINOPHEN 500 MG PO TABS
1000.0000 mg | ORAL_TABLET | Freq: Once | ORAL | Status: AC
Start: 2021-11-17 — End: 2021-11-17
  Administered 2021-11-17: 1000 mg via ORAL
  Filled 2021-11-17: qty 2

## 2021-11-17 NOTE — ED Provider Notes (Signed)
[facility] DEPT Provider Note   CSN: [id number] Arrival date & time: 11/17/21  3785     History  Chief Complaint  Patient presents with   Weakness   COVID    Kari Coleman is a 71 y.o. female with history of hypertension, retinal detachment, hyperthyroidism, urinary incontinence, cervical cancer, glaucoma, GERD who presents the emergency department complaining of generalized weakness and cough.  States that her daughter tested her for COVID yesterday and it was positive.  She had 1 episode of vomiting last night, and one episode of diarrhea.  Denies any chest pain or shortness of breath.  States that she fell while trying to get out of the bath this morning, did not hit her head, is not on chronic anticoagulation.    Weakness Associated symptoms: cough, diarrhea, fever and vomiting   Associated symptoms: no abdominal pain, no chest pain, no nausea and no shortness of breath        Home Medications Prior to Admission medications   Medication Sig Start Date End Date Taking? Authorizing Provider  acetaminophen (TYLENOL) 325 MG tablet Take 650 mg by mouth every 4 (four) hours as needed.    [provider]  albuterol (PROVENTIL HFA) 108 (90 Base) MCG/ACT inhaler Inhale 2 puffs into the lungs every 6 (six) hours as needed for wheezing or shortness of breath. 05/31/21   Inda Coke, PA  albuterol (PROVENTIL) (2.5 MG/3ML) 0.083% nebulizer solution Take 3 mLs (2.5 mg total) by nebulization every 6 (six) hours as needed for wheezing or shortness of breath. 07/12/21   Inda Coke, PA  amLODipine (NORVASC) 10 MG tablet TAKE 1 TABLET BY MOUTH  DAILY 03/10/21   Inda Coke, PA  aspirin EC 81 MG tablet Take 1 tablet (81 mg total) by mouth 2 (two) times daily after a meal. 04/22/19   Loni Dolly, PA-C  brimonidine (ALPHAGAN) 0.2 % ophthalmic solution Place 1 drop into the right eye 2 (two) times daily. 04/11/19   [provider]   budesonide-formoterol (SYMBICORT) 80-4.5 MCG/ACT inhaler Inhale 2 puffs into the lungs 2 (two) times daily. 07/15/21   Inda Coke, PA  dorzolamide-timolol (COSOPT) 22.3-6.8 MG/ML ophthalmic solution Place 1 drop into the right eye 2 (two) times daily. 11/12/20   [provider]  hydrochlorothiazide (HYDRODIURIL) 25 MG tablet Take 1 tablet (25 mg total) by mouth daily. 01/07/21   Inda Coke, PA  ketorolac (ACULAR) 0.4 % SOLN Place 1 drop into the right eye 4 times daily.    [provider]  latanoprost (XALATAN) 0.005 % ophthalmic solution Place 1 drop into both eyes at bedtime. As directed 11/20/14   [provider]  levothyroxine (SYNTHROID) 175 MCG tablet Take 1 tablet (175 mcg total) by mouth daily. 08/26/20   Renato Shin, MD  lisinopril (ZESTRIL) 40 MG tablet Take 1 tablet (40 mg total) by mouth daily. 01/07/21   Inda Coke, PA  metoprolol succinate (TOPROL-XL) 100 MG 24 hr tablet TAKE 1 TABLET BY MOUTH ONCE  DAILY ; TAKE WITH OR IMMEDIATELY FOLLOWING A MEAL 05/12/21   Inda Coke, PA  pantoprazole (PROTONIX) 20 MG tablet TAKE 1 TABLET BY MOUTH  DAILY 09/21/21   Richardo Priest, MD  pravastatin (PRAVACHOL) 10 MG tablet Take 1 tablet (10 mg total) by mouth daily. 08/31/21   Inda Coke, PA  prednisoLONE acetate (PRED FORTE) 1 % ophthalmic suspension Place 1 drop into the right eye 3 times daily. 08/18/21   [provider]  Allergies    Doxycycline, Codeine, and Pollen extract    Review of Systems   Review of Systems  Constitutional:  Positive for fever.  Respiratory:  Positive for cough. Negative for shortness of breath.   Cardiovascular:  Negative for chest pain.  Gastrointestinal:  Positive for diarrhea and vomiting. Negative for abdominal pain and nausea.  Neurological:  Positive for weakness.  All other systems reviewed and are negative.   Physical Exam Updated Vital Signs BP 119/82   Pulse (!) 59   Temp 98.9 F (37.2  C) (Oral)   Resp 20   SpO2 95%  Physical Exam Vitals and nursing note reviewed.  Constitutional:      Appearance: Normal appearance.  HENT:     Head: Normocephalic and atraumatic.  Eyes:     Conjunctiva/sclera: Conjunctivae normal.  Cardiovascular:     Rate and Rhythm: Normal rate and regular rhythm.  Pulmonary:     Effort: Pulmonary effort is normal. No respiratory distress.     Comments: Very mild expiratory wheezing in upper lung fields Abdominal:     General: There is no distension.     Palpations: Abdomen is soft.     Tenderness: There is no abdominal tenderness.  Skin:    General: Skin is warm and dry.  Neurological:     General: No focal deficit present.     Mental Status: She is alert.     ED Results / Procedures / Treatments   Labs (all labs ordered are listed, but only abnormal results are displayed) Labs Reviewed  BASIC METABOLIC PANEL - Abnormal; Notable for the following components:      Result Value   Glucose, Bld 134 (*)    Creatinine, Ser 1.41 (*)    GFR, Estimated 39 (*)    All other components within normal limits  CBC  URINALYSIS, ROUTINE W REFLEX MICROSCOPIC    EKG EKG Interpretation  Date/Time:  Thursday November 17 2021 08:07:50 EDT Ventricular Rate:  72 PR Interval:  183 QRS Duration: 97 QT Interval:  395 QTC Calculation: 433 R Axis:   37 Text Interpretation: Sinus rhythm Anterior infarct, old Nonspecific T abnormalities, inferior leads Confirmed by Dene Gentry [phone]) on 11/17/2021 8:48:53 AM  Radiology DG Chest Portable 1 View  Result Date: 11/17/2021 CLINICAL DATA:  Cough, shortness of breath EXAM: PORTABLE CHEST 1 VIEW COMPARISON:  07/12/2021 FINDINGS: The heart size and mediastinal contours are within normal limits. Both lungs are clear. The visualized skeletal structures are unremarkable. IMPRESSION: No active disease. Electronically Signed   By: Rolm Baptise M.D.   On: 11/17/2021 08:31    Procedures Procedures     Medications Ordered in ED Medications  acetaminophen (TYLENOL) tablet 1,000 mg (1,000 mg Oral Given 11/17/21 0909)    ED Course/ Medical Decision Making/ A&P                           Medical Decision Making Amount and/or Complexity of Data Reviewed Labs: ordered. Radiology: ordered.  Risk OTC drugs.  This patient is a 71 y.o. female  who presents to the ED for concern of weakness and cough since yesterday. COVID positive as of home test yesterday.    Differential diagnoses prior to evaluation: The emergent differential diagnosis includes, but is not limited to,  Arrhythmia, syncope, orthostatic hypotension, sepsis, hypoglycemia, electrolyte disturbance, hypothyroidism, respiratory failure, anemia, dehydration, heat injury, polypharmacy, malignancy, viral infection.  This is not an exhaustive differential.  Past Medical History / Co-morbidities: Hypertension, retinal detachment, hyperthyroidism, urinary incontinence, cervical cancer, glaucoma, GERD  Physical Exam: Physical exam performed. The pertinent findings include: Febrile to 102.3 F. Not tachycardic, no increased work of breathing. Normal oxygen saturation on room air. Mild expiratory wheezing in upper lung fields.  Lab Tests/Imaging studies: I personally interpreted labs/imaging and the pertinent results include: No leukocytosis, normal hemoglobin.  Creatinine 1.41, stable compared to prior.  Glucose 134.  Otherwise BMP unremarkable.  Chest x-ray with no acute cardiopulmonary abnormalities..  I reviewed the images and I agree with the radiologist interpretation.  Cardiac monitoring: EKG obtained and interpreted by my attending physician which shows: Sinus rhythm   Medications: I ordered medication including Tylenol for fever.  I have reviewed the patients home medicines and have made adjustments as needed.  Reevaluation patient's temperature is 98.9 F.   Disposition: After consideration of the diagnostic results and  the patients response to treatment, I feel that emergency department workup does not suggest an emergent condition requiring admission or immediate intervention beyond what has been performed at this time. The plan is: Discharge to home with symptomatic management of symptoms related to COVID-19 infection. Recommended she use her home nebulizer as needed for wheezing and tylenol as needed for fever. The patient is safe for discharge and has been instructed to return immediately for worsening symptoms, change in symptoms or any other concerns.   Final Clinical Impression(s) / ED Diagnoses Final diagnoses:  COVID-19  Acute cough  Generalized weakness    Rx / DC Orders ED Discharge Orders     None      Portions of this report may have been transcribed using voice recognition software. Every effort was made to ensure accuracy; however, inadvertent computerized transcription errors may be present.    Estill Cotta 11/17/21 1454    Valarie Merino, MD 11/17/21 1520

## 2021-11-17 NOTE — ED Triage Notes (Signed)
Ems brings pt in from home for generalized weakness. Pt covid positive. Pt also complains of fever and cough.

## 2021-11-17 NOTE — Discharge Instructions (Addendum)
You were seen in the emergency department today for weakness and coughing.  As we discussed, your lab work, EKG and chest x-ray all looked reassuring. Continue tylenol as needed for fever and your nebulizer as needed for wheezing. I suspect your symptoms are related to COVID.   Continue to monitor how you're doing and return to the ER for new or worsening symptoms.

## 2021-11-18 ENCOUNTER — Telehealth: Payer: Self-pay | Admitting: Physician Assistant

## 2021-11-18 NOTE — Telephone Encounter (Signed)
Spoke to pt's daughter [name], asked her if pt went to back to the hospital last night. Crystal said yes, she is at [facility] and is being admitted. Told her okay, I will let [name] know, hope she gets better soon. Crystal verbalized understanding.

## 2021-11-18 NOTE — Telephone Encounter (Signed)
Patient Name: Kari Coleman [facility] Gender: Female DOB: Mar 12, 1951 Age: 71 Y 2 M 2 D Return Phone Number: [phone] (Primary) Address: City/ State/ Zip: [place]  [address] Client [facility] [place] Client Site [place] [facility] [place] Night Provider Inda Coke- [place] Contact Type Call Who Is Calling Patient / Member / Family / Caregiver Call Type Triage / Clinical Caller Name Crystal Turner Relationship To Patient Daughter Return Phone Number [phone] (Primary) Chief Complaint Weakness, Generalized Reason for Call Symptomatic / Request for [place] states her mother has Covid and she is very weak. She is very lethargic and cant walk. Translation No Nurse Assessment Nurse: Della Goo, RN, Vicente Males Date/Time (Eastern Time): 11/17/2021 8:31:04 PM Confirm and document reason for call. If symptomatic, describe symptoms. ---Caller states mother has covid, was released from the hospital today. unable to stay awake or walk. she was not like this when she was discharged. Does the patient have any new or worsening symptoms? ---Yes Will a triage be completed? ---Yes Related visit to physician within the last 2 weeks? ---Yes Does the PT have any chronic conditions? (i.e. diabetes, asthma, this includes High risk factors for pregnancy, etc.) ---No Is this a behavioral health or substance abuse call? ---No Guidelines Guideline Title Affirmed Question Affirmed Notes Nurse Date/Time Eilene Ghazi Time) Post-Hospitalization Follow-up Call Sounds like a life-threatening emergency to the triager Della Goo, RN, Vicente Males 11/17/2021 8:31:51 PM Disp. Time Eilene Ghazi Time) Disposition Final User 11/17/2021 8:34:30 PM Call EMS 911 Now Yes Quandt, RN, Lawernce Pitts. Time Eilene Ghazi Time) Disposition Final User 11/17/2021 8:39:38 PM 911 Outcome Documentation Quandt, RN, Vicente Males Reason: unable to reach caller Final  Disposition 11/17/2021 8:34:30 PM Call EMS 911 Now Yes Quandt, RN, Allegra Lai Disagree/Comply Comply Caller Understands Yes PreDisposition InappropriateToAsk Care Advice Given Per Guideline CALL EMS 911 NOW: * Triager Discretion: I'll call you back in a few minutes to be sure you were able to reach them. CARE ADVICE per Post-Hospitalization Follow-Up Call (Adult) guideline. Comments User: Juanda Crumble, RN Date/Time Eilene Ghazi Time): 11/17/2021 8:34:57 PM affirming question selected d/t severity of symptoms

## 2021-11-18 NOTE — Telephone Encounter (Signed)
FYI, see Triage note and message.

## 2021-11-18 NOTE — Telephone Encounter (Signed)
Left message on voicemail to call office.  

## 2021-12-04 ENCOUNTER — Other Ambulatory Visit: Payer: Self-pay

## 2021-12-04 ENCOUNTER — Emergency Department (HOSPITAL_COMMUNITY)
Admission: EM | Admit: 2021-12-04 | Discharge: 2021-12-04 | Disposition: A | Discharge status: Home / Self Care | Payer: Medicare Other | Attending: Emergency Medicine | Admitting: Emergency Medicine

## 2021-12-04 ENCOUNTER — Encounter (HOSPITAL_COMMUNITY): Payer: Self-pay

## 2021-12-04 DIAGNOSIS — K029 Dental caries, unspecified: Secondary | ICD-10-CM | POA: Insufficient documentation

## 2021-12-04 DIAGNOSIS — K0889 Other specified disorders of teeth and supporting structures: Secondary | ICD-10-CM | POA: Diagnosis present

## 2021-12-04 DIAGNOSIS — K047 Periapical abscess without sinus: Secondary | ICD-10-CM

## 2021-12-04 MED ORDER — CLINDAMYCIN HCL 150 MG PO CAPS
150.0000 mg | ORAL_CAPSULE | Freq: Four times a day (QID) | ORAL | 0 refills | Status: DC
Start: 2021-12-04 — End: 2022-01-04

## 2021-12-04 NOTE — ED Provider Notes (Signed)
[facility] DEPT Provider Note   CSN: [id number] Arrival date & time: 12/04/21  1958     History  Chief Complaint  Patient presents with   Facial Swelling    Kari Coleman is a 71 y.o. female.  71 year old female presents with right-sided facial swelling.  Has had a history of dental caries.  Swelling started today.  No trouble swallowing her secretions.  Has had some dental pain.  No treatment use prior to arrival       Home Medications Prior to Admission medications   Medication Sig Start Date End Date Taking? Authorizing Provider  acetaminophen (TYLENOL) 325 MG tablet Take 650 mg by mouth every 4 (four) hours as needed.    [provider]  albuterol (PROVENTIL HFA) 108 (90 Base) MCG/ACT inhaler Inhale 2 puffs into the lungs every 6 (six) hours as needed for wheezing or shortness of breath. 05/31/21   Inda Coke, PA  albuterol (PROVENTIL) (2.5 MG/3ML) 0.083% nebulizer solution Take 3 mLs (2.5 mg total) by nebulization every 6 (six) hours as needed for wheezing or shortness of breath. 07/12/21   Inda Coke, PA  amLODipine (NORVASC) 10 MG tablet TAKE 1 TABLET BY MOUTH  DAILY 03/10/21   Inda Coke, PA  aspirin EC 81 MG tablet Take 1 tablet (81 mg total) by mouth 2 (two) times daily after a meal. 04/22/19   Loni Dolly, PA-C  brimonidine (ALPHAGAN) 0.2 % ophthalmic solution Place 1 drop into the right eye 2 (two) times daily. 04/11/19   [provider]  budesonide-formoterol (SYMBICORT) 80-4.5 MCG/ACT inhaler Inhale 2 puffs into the lungs 2 (two) times daily. 07/15/21   Inda Coke, PA  dorzolamide-timolol (COSOPT) 22.3-6.8 MG/ML ophthalmic solution Place 1 drop into the right eye 2 (two) times daily. 11/12/20   [provider]  hydrochlorothiazide (HYDRODIURIL) 25 MG tablet Take 1 tablet (25 mg total) by mouth daily. 01/07/21   Inda Coke, PA  ketorolac (ACULAR) 0.4 % SOLN Place 1 drop into the right eye 4  times daily.    [provider]  latanoprost (XALATAN) 0.005 % ophthalmic solution Place 1 drop into both eyes at bedtime. As directed 11/20/14   [provider]  levothyroxine (SYNTHROID) 175 MCG tablet Take 1 tablet (175 mcg total) by mouth daily. 08/26/20   Renato Shin, MD  lisinopril (ZESTRIL) 40 MG tablet Take 1 tablet (40 mg total) by mouth daily. 01/07/21   Inda Coke, PA  metoprolol succinate (TOPROL-XL) 100 MG 24 hr tablet TAKE 1 TABLET BY MOUTH ONCE  DAILY ; TAKE WITH OR IMMEDIATELY FOLLOWING A MEAL 05/12/21   Inda Coke, PA  pantoprazole (PROTONIX) 20 MG tablet TAKE 1 TABLET BY MOUTH  DAILY 09/21/21   Richardo Priest, MD  pravastatin (PRAVACHOL) 10 MG tablet Take 1 tablet (10 mg total) by mouth daily. 08/31/21   Inda Coke, PA  prednisoLONE acetate (PRED FORTE) 1 % ophthalmic suspension Place 1 drop into the right eye 3 times daily. 08/18/21   [provider]      Allergies    Doxycycline, Codeine, and Pollen extract    Review of Systems   Review of Systems  All other systems reviewed and are negative.   Physical Exam Updated Vital Signs BP (!) 150/93 (BP Location: Left Arm)   Pulse 74   Temp 99 F (37.2 C) (Oral)   Resp 16   Ht 1.702 m ('5\' 7"'$ )   Wt 100 kg   SpO2 98%  BMI 34.53 kg/m  Physical Exam Vitals and nursing note reviewed.  Constitutional:      General: She is not in acute distress.    Appearance: Normal appearance. She is well-developed. She is not toxic-appearing.  HENT:     Head: Normocephalic and atraumatic.     Mouth/Throat:     Dentition: Abnormal dentition. Dental tenderness and dental caries present.     Pharynx: Oropharynx is clear.     Tonsils: No tonsillar exudate or tonsillar abscesses.  Eyes:     General: Lids are normal.     Conjunctiva/sclera: Conjunctivae normal.     Pupils: Pupils are equal, round, and reactive to light.  Neck:     Thyroid: No thyroid mass.     Trachea: No tracheal deviation.   Cardiovascular:     Rate and Rhythm: Normal rate and regular rhythm.     Heart sounds: Normal heart sounds. No murmur heard.    No gallop.  Pulmonary:     Effort: Pulmonary effort is normal. No respiratory distress.     Breath sounds: Normal breath sounds. No stridor. No decreased breath sounds, wheezing, rhonchi or rales.  Abdominal:     General: There is no distension.     Palpations: Abdomen is soft.     Tenderness: There is no abdominal tenderness. There is no rebound.  Musculoskeletal:        General: No tenderness. Normal range of motion.     Cervical back: Normal range of motion and neck supple.  Skin:    General: Skin is warm and dry.     Findings: No abrasion or rash.  Neurological:     Mental Status: She is alert and oriented to person, place, and time. Mental status is at baseline.     GCS: GCS eye subscore is 4. GCS verbal subscore is 5. GCS motor subscore is 6.     Cranial Nerves: No cranial nerve deficit.     Sensory: No sensory deficit.     Motor: Motor function is intact.  Psychiatric:        Attention and Perception: Attention normal.        Speech: Speech normal.        Behavior: Behavior normal.     ED Results / Procedures / Treatments   Labs (all labs ordered are listed, but only abnormal results are displayed) Labs Reviewed - No data to display  EKG None  Radiology No results found.  Procedures Procedures    Medications Ordered in ED Medications - No data to display  ED Course/ Medical Decision Making/ A&P                           Medical Decision Making  Patient with severe dental caries.  Likely developing early abscess.  No evidence of Ludwick's.  No evidence of angioedema as patient is on ACE inhibitor.  Plan will be placed on clindamycin and she will follow-up with her dentist        Final Clinical Impression(s) / ED Diagnoses Final diagnoses:  None    Rx / DC Orders ED Discharge Orders     None         Lacretia Leigh, MD 12/04/21 2032

## 2021-12-04 NOTE — ED Triage Notes (Addendum)
Patient said her throat began hurting last night and then the right side of her face feels like it is swelling. Had covid 2 weeks ago. No shortness of breath. Said she has taken lisinopril for years.

## 2021-12-06 ENCOUNTER — Telehealth: Payer: Self-pay | Admitting: Physician Assistant

## 2021-12-06 NOTE — Telephone Encounter (Signed)
FYI, see Triage note. 

## 2021-12-06 NOTE — Telephone Encounter (Signed)
Pt was seen in ED on 12/04/21   Patient Name: Kari Coleman Gender: Female DOB: 1950/07/11 Age: 71 Y 2 M 18 D Return Phone Number: [phone] (Primary) Address: City/ State/ Zip: [place]  [address] Client [facility] [place] Client Site [place] [facility] [place] Night Provider Inda Coke- [place] Contact Type Call Who Is Calling Patient / Member / Family / Caregiver Call Type Triage / Clinical Caller Name crystal turner Relationship To Patient Daughter Return Phone Number [phone] (Primary) Chief Complaint Facial Swelling Reason for Call Symptomatic / Request for [place] states her mom got out of hospital last week with covid and today her gums hurt and one side of mouth is swollen. Also a sore throat. Translation No Nurse Assessment Nurse: Glean Salvo, RN, Magda Paganini Date/Time (Eastern Time): 12/04/2021 5:00:59 PM Confirm and document reason for call. If symptomatic, describe symptoms. ---Caller states that her mother is having facial swelling and tenderness. Started this morning. Started with her gums Does the patient have any new or worsening symptoms? ---Yes Will a triage be completed? ---Yes Related visit to physician within the last 2 weeks? ---Yes Does the PT have any chronic conditions? (i.e. diabetes, asthma, this includes High risk factors for pregnancy, etc.) ---Yes List chronic conditions. ---HTN, thyroid, glaucoma, cholesterol Is this a behavioral health or substance abuse call? ---No Guidelines Guideline Title Affirmed Question Affirmed Notes Nurse Date/Time Eilene Ghazi Time) Face Swelling Taking an ACE Inhibitor medicine (e.g., benazepril / LOTENSIN, captopril / Virl Diamond, RN, Magda Paganini 12/04/2021 5:05:14 PM Guidelines Guideline Title Affirmed Question Affirmed Notes Nurse Date/Time Eilene Ghazi Time) enalapril / VASOTEC, lisinopril / ZESTRIL) [place]. Time  Eilene Ghazi Time) Disposition Final User 12/04/2021 5:09:09 PM Go to ED Now (or PCP triage) Yes Glean Salvo, RN, Magda Paganini Final Disposition 12/04/2021 5:09:09 PM Go to ED Now (or PCP triage) Yes Glean Salvo, RN, Christa See Disagree/Comply Comply Caller Understands Yes PreDisposition Call Doctor Care Advice Given Per Guideline CARE ADVICE given per Face Swelling (Adult) guideline. GO TO ED NOW (OR PCP TRIAGE): Referrals GO TO FACILITY UNDECIDED

## 2021-12-12 ENCOUNTER — Ambulatory Visit (INDEPENDENT_AMBULATORY_CARE_PROVIDER_SITE_OTHER): Payer: Medicare Other

## 2021-12-12 ENCOUNTER — Other Ambulatory Visit: Payer: Self-pay | Admitting: Physician Assistant

## 2021-12-12 DIAGNOSIS — R2689 Other abnormalities of gait and mobility: Secondary | ICD-10-CM

## 2021-12-12 DIAGNOSIS — Z0000 Encounter for general adult medical examination without abnormal findings: Secondary | ICD-10-CM | POA: Diagnosis not present

## 2021-12-12 NOTE — Progress Notes (Addendum)
Virtual Visit via Telephone Note  I connected with  Kari Coleman on 12/12/21 at  1:15 PM EDT by telephone and verified that I am speaking with the correct person using two identifiers.  Medicare Annual Wellness visit completed telephonically due to Covid-19 pandemic.   Persons participating in this call: This Health Coach and this patient.   Location: Patient: home Provider: office    I discussed the limitations, risks, security and privacy concerns of performing an evaluation and management service by telephone and the availability of in person appointments. The patient expressed understanding and agreed to proceed.  Unable to perform video visit due to video visit attempted and failed and/or patient does not have video capability.   Some vital signs may be absent or patient reported.   Willette Brace, LPN   Subjective:   Kari Coleman is a 71 y.o. female who presents for Medicare Annual (Subsequent) preventive examination.  Review of Systems     Cardiac Risk Factors include: advanced age (>23mn, >>64women);hypertension;obesity (BMI >30kg/m2)     Objective:    There were no vitals filed for this visit. There is no height or weight on file to calculate BMI.     12/12/2021    1:17 PM 12/04/2021    8:10 PM 11/22/2020   11:52 AM 11/17/2019   11:57 AM 07/31/2019    3:07 PM 04/22/2019    7:55 AM 04/18/2019   10:17 AM  Advanced Directives  Does Patient Have a Medical Advance Directive? No No Yes No No No No  Does patient want to make changes to medical advance directive?   Yes (MAU/Ambulatory/Procedural Areas - Information given)      Would patient like information on creating a medical advance directive? No - Patient declined No - Patient declined  Yes (MAU/Ambulatory/Procedural Areas - Information given)  No - Patient declined No - Patient declined    Current Medications (verified) Outpatient Encounter Medications as of 12/12/2021  Medication Sig   acetaminophen (TYLENOL)  325 MG tablet Take 650 mg by mouth every 4 (four) hours as needed.   albuterol (PROVENTIL HFA) 108 (90 Base) MCG/ACT inhaler Inhale 2 puffs into the lungs every 6 (six) hours as needed for wheezing or shortness of breath.   albuterol (PROVENTIL) (2.5 MG/3ML) 0.083% nebulizer solution Take 3 mLs (2.5 mg total) by nebulization every 6 (six) hours as needed for wheezing or shortness of breath.   amLODipine (NORVASC) 10 MG tablet TAKE 1 TABLET BY MOUTH  DAILY   aspirin EC 81 MG tablet Take 1 tablet (81 mg total) by mouth 2 (two) times daily after a meal.   brimonidine (ALPHAGAN) 0.2 % ophthalmic solution Place 1 drop into the right eye 2 (two) times daily.   budesonide-formoterol (SYMBICORT) 80-4.5 MCG/ACT inhaler Inhale 2 puffs into the lungs 2 (two) times daily.   clindamycin (CLEOCIN) 150 MG capsule Take 1 capsule (150 mg total) by mouth every 6 (six) hours.   dorzolamide-timolol (COSOPT) 22.3-6.8 MG/ML ophthalmic solution Place 1 drop into the right eye 2 (two) times daily.   hydrochlorothiazide (HYDRODIURIL) 25 MG tablet Take 1 tablet (25 mg total) by mouth daily.   ketorolac (ACULAR) 0.4 % SOLN Place 1 drop into the right eye 4 times daily.   latanoprost (XALATAN) 0.005 % ophthalmic solution Place 1 drop into both eyes at bedtime. As directed   levothyroxine (SYNTHROID) 175 MCG tablet Take 1 tablet (175 mcg total) by mouth daily.   lisinopril (ZESTRIL) 40 MG tablet  Take 1 tablet (40 mg total) by mouth daily.   metoprolol succinate (TOPROL-XL) 100 MG 24 hr tablet TAKE 1 TABLET BY MOUTH ONCE  DAILY ; TAKE WITH OR IMMEDIATELY FOLLOWING A MEAL   pantoprazole (PROTONIX) 20 MG tablet TAKE 1 TABLET BY MOUTH  DAILY   pravastatin (PRAVACHOL) 10 MG tablet Take 1 tablet (10 mg total) by mouth daily.   prednisoLONE acetate (PRED FORTE) 1 % ophthalmic suspension Place 1 drop into the right eye 3 times daily.   No facility-administered encounter medications on file as of 12/12/2021.    Allergies  (verified) Doxycycline, Codeine, and Pollen extract   History: Past Medical History:  Diagnosis Date   Allergy    Arthritis    Cataract    Cervical cancer ([place])    GERD (gastroesophageal reflux disease)    Glaucoma 2017   History of echocardiogram    Echo 9/16:  Mild LVH, EF 55-60%, no RWMA, Gr 1 DD, mild MR // Echo 1/17:  Mild LVH, EF 50-55%, Gr 1 DD, mild to mod MR, mild LAE   Hypertension    Hyperthyroidism    s/p RAI treatment   Palpitations    PONV (postoperative nausea and vomiting)    years ago  . Woke up and saw asian people    PONV (postoperative nausea and vomiting) 07/18/2021   Retinal detachment    L eye - partial blindness   Tobacco abuse    Tuberculosis    positive test as a caregiver cxr annually    Urinary incontinence    Past Surgical History:  Procedure Laterality Date   ABDOMINAL HYSTERECTOMY  1984   bladder surgey for incontinence     EYE SURGERY     REPAIR OF COMPLEX TRACTION RETINAL DETACHMENT     thyroid radiation     TOTAL ABDOMINAL HYSTERECTOMY W/ BILATERAL SALPINGOOPHORECTOMY     TOTAL KNEE ARTHROPLASTY Right 04/22/2019   Procedure: RIGHT TOTAL KNEE ARTHROPLASTY;  Surgeon: Melrose Nakayama, MD;  Location: [facility];  Service: Orthopedics;  Laterality: Right;  PENDING PRE-OP FOR SDDC APPROVAL   Family History  Problem Relation Age of Onset   Hypertension Mother    Heart failure Mother    Diabetes Mother    Pancreatic cancer Father    Colon cancer Maternal Aunt    Cancer Sister    Thyroid disease Paternal Aunt    Cancer Paternal Aunt    Thyroid disease Other        paternal side   Cancer Maternal Grandfather    Diabetes Paternal Grandmother    Diabetes Maternal Uncle    Colon polyps Neg Hx    Stomach cancer Neg Hx    Social History   Socioeconomic History   Marital status: Single    Spouse name: Not on file   Number of children: 2   Years of education: Not on file   Highest education level: Not on file  Occupational History    Occupation: CNA    Comment: [employer]   Occupation: Retired  Tobacco Use   Smoking status: Former    Packs/day: 0.50    Years: 48.00    Total pack years: 24.00    Types: Cigarettes    Start date: 11/30/1968    Quit date: 10/31/2016    Years since quitting: 5.1   Smokeless tobacco: Never   Tobacco comments:    did not smoke more than .5 pack   Vaping Use   Vaping Use: Never used  Substance and Sexual Activity   Alcohol use: Not Currently    Alcohol/week: 0.0 standard drinks of alcohol    Comment: rarely once a year   Drug use: No   Sexual activity: Not Currently  Other Topics Concern   Not on file  Social History Narrative   Caregiver   Daughter and grandson in household   Social Determinants of Health   Financial Resource Strain: Low Risk  (12/12/2021)   Overall Financial Resource Strain (CARDIA)    Difficulty of Paying Living Expenses: Not hard at all  Food Insecurity: No Food Insecurity (12/12/2021)   Hunger Vital Sign    Worried About Running Out of Food in the Last Year: Never true    Ran Out of Food in the Last Year: Never true  Transportation Needs: No Transportation Needs (12/12/2021)   PRAPARE - Hydrologist (Medical): No    Lack of Transportation (Non-Medical): No  Physical Activity: Inactive (12/12/2021)   Exercise Vital Sign    Days of Exercise per Week: 0 days    Minutes of Exercise per Session: 0 min  Stress: No Stress Concern Present (12/12/2021)   [place]    Feeling of Stress : Not at all  Social Connections: Moderately Isolated (12/12/2021)   Social Connection and Isolation Panel [NHANES]    Frequency of Communication with Friends and Family: More than three times a week    Frequency of Social Gatherings with Friends and Family: Twice a week    Attends Religious Services: 1 to 4 times per year    Active Member of [employer] or Organizations: No    Attends  Archivist Meetings: Never    Marital Status: Never married    Tobacco Counseling Counseling given: Not Answered Tobacco comments: did not smoke more than .5 pack    Clinical Intake:  Pre-visit preparation completed: Yes  Pain : No/denies pain     BMI - recorded: 34.61 Nutritional Status: BMI > 30  Obese Nutritional Risks: None Diabetes: No  How often do you need to have someone help you when you read instructions, pamphlets, or other written materials from your doctor or pharmacy?: 1 - Never  Diabetic?no  Interpreter Needed?: No  Information entered by :: Charlott Rakes, LPN   Activities of Daily Living    12/12/2021    1:18 PM  In your present state of health, do you have any difficulty performing the following activities:  Hearing? 0  Vision? 0  Difficulty concentrating or making decisions? 0  Walking or climbing stairs? 0  Dressing or bathing? 0  Doing errands, shopping? 0  Preparing Food and eating ? N  Using the Toilet? N  In the past six months, have you accidently leaked urine? N  Do you have problems with loss of bowel control? N  Managing your Medications? N  Managing your Finances? N  Housekeeping or managing your Housekeeping? N    Patient Care Team: Inda Coke, [place] as PCP - General (Physician Assistant) Gerda Diss, DO as Consulting Physician (Sports Medicine) Richardo Priest, MD as Consulting Physician (Cardiology)  Indicate any recent Medical Services you may have received from other than [facility] providers in the past year (date may be approximate).     Assessment:   This is a routine wellness examination for Kari Coleman.  Hearing/Vision screen Hearing Screening - Comments:: Pt denies any hearing issues  Vision Screening - Comments:: Pt follows  up with Dr In Adrian [facility]   Dietary issues and exercise activities discussed: Current Exercise Habits: The patient does not participate in regular exercise at present   Goals  Addressed             This Visit's Progress    Patient Stated       Pt wants to start physical therapy        Depression Screen    12/12/2021    1:15 PM 11/22/2020   11:51 AM 11/17/2019   11:55 AM 11/13/2018    3:53 PM 11/08/2017    9:08 AM 06/07/2017   12:29 PM 06/07/2017    9:37 AM  PHQ 2/9 Scores  PHQ - 2 Score 0 0 0 0 1 0 0    Fall Risk    12/12/2021    1:18 PM 11/22/2020   11:53 AM 11/17/2019   11:58 AM 11/13/2018    3:53 PM 11/08/2017    9:08 AM  Fall Risk   Falls in the past year? 1 0 0 0 No  Number falls in past yr: 1 0 0 0   Injury with Fall? 0 0 0 0   Risk for fall due to : Impaired vision;Impaired mobility;Impaired balance/gait Impaired vision;Impaired balance/gait Impaired balance/gait;Impaired mobility;Impaired vision;Orthopedic patient Impaired mobility   Risk for fall due to: Comment   hx of knee replacment    Follow up Falls prevention discussed Falls prevention discussed Falls prevention discussed Education provided     [place]:  Any stairs in or around the home? No  If so, are there any without handrails? No  Home free of loose throw rugs in walkways, pet beds, electrical cords, etc? Yes  Adequate lighting in your home to reduce risk of falls? Yes   ASSISTIVE DEVICES UTILIZED TO PREVENT FALLS:  Life alert? No  Use of a cane, walker or w/c? No  Grab bars in the bathroom? No  Shower chair or bench in shower? No  Elevated toilet seat or a handicapped toilet? No   TIMED UP AND GO:  Was the test performed? No .  Cognitive Function:        12/12/2021    1:19 PM 11/22/2020   11:55 AM 11/17/2019   12:02 PM  6CIT Screen  What Year? 0 points 0 points 0 points  What month? 0 points 0 points 0 points  What time? 0 points 0 points   Count back from 20 0 points 0 points 0 points  Months in reverse 4 points 0 points 0 points  Repeat phrase 6 points 0 points 4 points  Total Score 10 points 0 points      Immunizations Immunization History  Administered Date(s) Administered   Fluad Quad(high Dose 65+) 04/09/2019, 01/07/2021   Influenza, High Dose Seasonal PF 06/10/2018   PFIZER(Purple Top)SARS-COV-2 Vaccination 06/15/2019, 07/07/2019   PPD Test 02/29/2016    TDAP status: Due, Education has been provided regarding the importance of this vaccine. Advised may receive this vaccine at local pharmacy or Health Dept. Aware to provide a copy of the vaccination record if obtained from local pharmacy or Health Dept. Verbalized acceptance and understanding.  Flu Vaccine status: Up to date  Pneumococcal vaccine status: Due, Education has been provided regarding the importance of this vaccine. Advised may receive this vaccine at local pharmacy or Health Dept. Aware to provide a copy of the vaccination record if obtained from local pharmacy or Health Dept. Verbalized acceptance  and understanding.  Covid-19 vaccine status: Completed vaccines  Qualifies for Shingles Vaccine? Yes   Zostavax completed No   Shingrix Completed?: No.    Education has been provided regarding the importance of this vaccine. Patient has been advised to call insurance company to determine out of pocket expense if they have not yet received this vaccine. Advised may also receive vaccine at local pharmacy or Health Dept. Verbalized acceptance and understanding.  Screening Tests Health Maintenance  Topic Date Due   Zoster Vaccines- Shingrix (1 of 2) Never done   Pneumonia Vaccine 28+ Years old (1 - PCV) Never done   COVID-19 Vaccine (3 - Pfizer risk series) 08/04/2019   INFLUENZA VACCINE  11/01/2021   TETANUS/TDAP  02/18/2022 (Originally 09/17/1969)   MAMMOGRAM  12/24/2022   COLONOSCOPY (Pts 45-17yr Insurance coverage will need to be confirmed)  01/29/2030   DEXA SCAN  Completed   Hepatitis C Screening  Completed   HPV VACCINES  Aged Out    Health Maintenance  Health Maintenance Due  Topic Date Due   Zoster  Vaccines- Shingrix (1 of 2) Never done   Pneumonia Vaccine 73 Years old (1 - PCV) Never done   COVID-19 Vaccine (3 - Pfizer risk series) 08/04/2019   INFLUENZA VACCINE  11/01/2021    Colorectal cancer screening: Type of screening: Colonoscopy. Completed 01/30/20. Repeat every 10 years  Mammogram status: Completed 12/23/20/. Repeat every year  Bone Density status: Completed 07/03/17. Results reflect: Bone density results: NORMAL. Repeat every 2 years.  Additional Screening:  Hepatitis C Screening: Completed 11/08/17  Vision Screening: Recommended annual ophthalmology exams for early detection of glaucoma and other disorders of the eye. Is the patient up to date with their annual eye exam?  Yes  Who is the provider or what is the name of the office in which the patient attends annual eye exams? [facility] If pt is not established with a provider, would they like to be referred to a provider to establish care? No .   Dental Screening: Recommended annual dental exams for proper oral hygiene  Community Resource Referral / Chronic Care Management: CRR required this visit?  No   CCM required this visit?  No      Plan:     I have personally reviewed and noted the following in the patient's chart:   Medical and social history Use of alcohol, tobacco or illicit drugs  Current medications and supplements including opioid prescriptions. Patient is not currently taking opioid prescriptions. Functional ability and status Nutritional status Physical activity Advanced directives List of other physicians Hospitalizations, surgeries, and ER visits in previous 12 months Vitals Screenings to include cognitive, depression, and falls Referrals and appointments  In addition, I have reviewed and discussed with patient certain preventive protocols, quality metrics, and best practice recommendations. A written personalized care plan for preventive services as well as general preventive  health recommendations were provided to patient.     TWillette Brace LPN   [id number]  Nurse Notes: pt is requesting PT for gait, stating she  feels like she is very wobbly, please advise

## 2021-12-12 NOTE — Patient Instructions (Signed)
Kari Coleman , Thank you for taking time to come for your Medicare Wellness Visit. I appreciate your ongoing commitment to your health goals. Please review the following plan we discussed and let me know if I can assist you in the future.   Screening recommendations/referrals: Colonoscopy: done 01/30/20 repeat every 10 years  Mammogram: done 12/23/20 repeat every year  Bone Density: done 07/03/17 repeat every 2 years  Recommended yearly ophthalmology/optometry visit for glaucoma screening and checkup Recommended yearly dental visit for hygiene and checkup  Vaccinations: Influenza vaccine: done 01/07/21 repeat every year  Pneumococcal vaccine: due  Tdap vaccine: due and discussed  Shingles vaccine: Shingrix is 2 doses 2-6 months apart and over 90% effective    Covid-19:done 3/14/, 07/07/19  Advanced directives: Advance directive discussed with you today. Even though you declined this today please call our office should you change your mind and we can give you the proper paperwork for you to fill out.  Conditions/risks identified: start working with PT for gait assistance   Next appointment: Follow up in one year for your annual wellness visit    Preventive Care 65 Years and Older, Female Preventive care refers to lifestyle choices and visits with your health care provider that can promote health and wellness. What does preventive care include? A yearly physical exam. This is also called an annual well check. Dental exams once or twice a year. Routine eye exams. Ask your health care provider how often you should have your eyes checked. Personal lifestyle choices, including: Daily care of your teeth and gums. Regular physical activity. Eating a healthy diet. Avoiding tobacco and drug use. Limiting alcohol use. Practicing safe sex. Taking low-dose aspirin every day. Taking vitamin and mineral supplements as recommended by your health care provider. What happens during an annual well  check? The services and screenings done by your health care provider during your annual well check will depend on your age, overall health, lifestyle risk factors, and family history of disease. Counseling  Your health care provider may ask you questions about your: Alcohol use. Tobacco use. Drug use. Emotional well-being. Home and relationship well-being. Sexual activity. Eating habits. History of falls. Memory and ability to understand (cognition). Work and work Statistician. Reproductive health. Screening  You may have the following tests or measurements: Height, weight, and BMI. Blood pressure. Lipid and cholesterol levels. These may be checked every 5 years, or more frequently if you are over 21 years old. Skin check. Lung cancer screening. You may have this screening every year starting at age 17 if you have a 30-pack-year history of smoking and currently smoke or have quit within the past 15 years. Fecal occult blood test (FOBT) of the stool. You may have this test every year starting at age 88. Flexible sigmoidoscopy or colonoscopy. You may have a sigmoidoscopy every 5 years or a colonoscopy every 10 years starting at age 80. Hepatitis C blood test. Hepatitis B blood test. Sexually transmitted disease (STD) testing. Diabetes screening. This is done by checking your blood sugar (glucose) after you have not eaten for a while (fasting). You may have this done every 1-3 years. Bone density scan. This is done to screen for osteoporosis. You may have this done starting at age 72. Mammogram. This may be done every 1-2 years. Talk to your health care provider about how often you should have regular mammograms. Talk with your health care provider about your test results, treatment options, and if necessary, the need for more tests. Vaccines  Your health care provider may recommend certain vaccines, such as: Influenza vaccine. This is recommended every year. Tetanus, diphtheria, and  acellular pertussis (Tdap, Td) vaccine. You may need a Td booster every 10 years. Zoster vaccine. You may need this after age 29. Pneumococcal 13-valent conjugate (PCV13) vaccine. One dose is recommended after age 34. Pneumococcal polysaccharide (PPSV23) vaccine. One dose is recommended after age 60. Talk to your health care provider about which screenings and vaccines you need and how often you need them. This information is not intended to replace advice given to you by your health care provider. Make sure you discuss any questions you have with your health care provider. Document Released: 04/16/2015 Document Revised: 12/08/2015 Document Reviewed: 01/19/2015 Elsevier Interactive Patient Education  2017 [place] Prevention in the Home Falls can cause injuries. They can happen to people of all ages. There are many things you can do to make your home safe and to help prevent falls. What can I do on the outside of my home? Regularly fix the edges of walkways and driveways and fix any cracks. Remove anything that might make you trip as you walk through a door, such as a raised step or threshold. Trim any bushes or trees on the path to your home. Use bright outdoor lighting. Clear any walking paths of anything that might make someone trip, such as rocks or tools. Regularly check to see if handrails are loose or broken. Make sure that both sides of any steps have handrails. Any raised decks and porches should have guardrails on the edges. Have any leaves, snow, or ice cleared regularly. Use sand or salt on walking paths during winter. Clean up any spills in your garage right away. This includes oil or grease spills. What can I do in the bathroom? Use night lights. Install grab bars by the toilet and in the tub and shower. Do not use towel bars as grab bars. Use non-skid mats or decals in the tub or shower. If you need to sit down in the shower, use a plastic, non-slip stool. Keep  the floor dry. Clean up any water that spills on the floor as soon as it happens. Remove soap buildup in the tub or shower regularly. Attach bath mats securely with double-sided non-slip rug tape. Do not have throw rugs and other things on the floor that can make you trip. What can I do in the bedroom? Use night lights. Make sure that you have a light by your bed that is easy to reach. Do not use any sheets or blankets that are too big for your bed. They should not hang down onto the floor. Have a firm chair that has side arms. You can use this for support while you get dressed. Do not have throw rugs and other things on the floor that can make you trip. What can I do in the kitchen? Clean up any spills right away. Avoid walking on wet floors. Keep items that you use a lot in easy-to-reach places. If you need to reach something above you, use a strong step stool that has a grab bar. Keep electrical cords out of the way. Do not use floor polish or wax that makes floors slippery. If you must use wax, use non-skid floor wax. Do not have throw rugs and other things on the floor that can make you trip. What can I do with my stairs? Do not leave any items on the stairs. Make sure that there are  handrails on both sides of the stairs and use them. Fix handrails that are broken or loose. Make sure that handrails are as long as the stairways. Check any carpeting to make sure that it is firmly attached to the stairs. Fix any carpet that is loose or worn. Avoid having throw rugs at the top or bottom of the stairs. If you do have throw rugs, attach them to the floor with carpet tape. Make sure that you have a light switch at the top of the stairs and the bottom of the stairs. If you do not have them, ask someone to add them for you. What else can I do to help prevent falls? Wear shoes that: Do not have high heels. Have rubber bottoms. Are comfortable and fit you well. Are closed at the toe. Do not  wear sandals. If you use a stepladder: Make sure that it is fully opened. Do not climb a closed stepladder. Make sure that both sides of the stepladder are locked into place. Ask someone to hold it for you, if possible. Clearly mark and make sure that you can see: Any grab bars or handrails. First and last steps. Where the edge of each step is. Use tools that help you move around (mobility aids) if they are needed. These include: Canes. Walkers. Scooters. Crutches. Turn on the lights when you go into a dark area. Replace any light bulbs as soon as they burn out. Set up your furniture so you have a clear path. Avoid moving your furniture around. If any of your floors are uneven, fix them. If there are any pets around you, be aware of where they are. Review your medicines with your doctor. Some medicines can make you feel dizzy. This can increase your chance of falling. Ask your doctor what other things that you can do to help prevent falls. This information is not intended to replace advice given to you by your health care provider. Make sure you discuss any questions you have with your health care provider. Document Released: 01/14/2009 Document Revised: 08/26/2015 Document Reviewed: 04/24/2014 Elsevier Interactive Patient Education  2017 [employer].

## 2021-12-19 ENCOUNTER — Other Ambulatory Visit: Payer: Self-pay | Admitting: Cardiology

## 2021-12-26 ENCOUNTER — Emergency Department (HOSPITAL_COMMUNITY): Payer: Medicare Other

## 2021-12-26 ENCOUNTER — Encounter (HOSPITAL_COMMUNITY): Payer: Self-pay

## 2021-12-26 ENCOUNTER — Inpatient Hospital Stay (HOSPITAL_COMMUNITY)
Admission: EM | Admit: 2021-12-26 | Discharge: 2022-01-04 | DRG: 176 | Disposition: A | Discharge status: Home Health | Payer: Medicare Other | Attending: Internal Medicine | Admitting: Internal Medicine

## 2021-12-26 ENCOUNTER — Ambulatory Visit: Payer: Medicare Other | Admitting: Family Medicine

## 2021-12-26 ENCOUNTER — Encounter: Payer: Self-pay | Admitting: *Deleted

## 2021-12-26 ENCOUNTER — Telehealth: Payer: Self-pay | Admitting: Physician Assistant

## 2021-12-26 DIAGNOSIS — E876 Hypokalemia: Secondary | ICD-10-CM | POA: Diagnosis not present

## 2021-12-26 DIAGNOSIS — Z800 Family history of malignant neoplasm of digestive organs: Secondary | ICD-10-CM

## 2021-12-26 DIAGNOSIS — U099 Post covid-19 condition, unspecified: Secondary | ICD-10-CM | POA: Diagnosis present

## 2021-12-26 DIAGNOSIS — Z885 Allergy status to narcotic agent status: Secondary | ICD-10-CM

## 2021-12-26 DIAGNOSIS — I82452 Acute embolism and thrombosis of left peroneal vein: Secondary | ICD-10-CM | POA: Diagnosis present

## 2021-12-26 DIAGNOSIS — E785 Hyperlipidemia, unspecified: Secondary | ICD-10-CM | POA: Diagnosis present

## 2021-12-26 DIAGNOSIS — K5900 Constipation, unspecified: Secondary | ICD-10-CM | POA: Diagnosis present

## 2021-12-26 DIAGNOSIS — E669 Obesity, unspecified: Secondary | ICD-10-CM | POA: Diagnosis present

## 2021-12-26 DIAGNOSIS — R7989 Other specified abnormal findings of blood chemistry: Secondary | ICD-10-CM | POA: Diagnosis present

## 2021-12-26 DIAGNOSIS — R110 Nausea: Secondary | ICD-10-CM | POA: Diagnosis present

## 2021-12-26 DIAGNOSIS — I82432 Acute embolism and thrombosis of left popliteal vein: Secondary | ICD-10-CM | POA: Diagnosis present

## 2021-12-26 DIAGNOSIS — Z881 Allergy status to other antibiotic agents status: Secondary | ICD-10-CM

## 2021-12-26 DIAGNOSIS — Z96651 Presence of right artificial knee joint: Secondary | ICD-10-CM | POA: Diagnosis present

## 2021-12-26 DIAGNOSIS — Z6834 Body mass index (BMI) 34.0-34.9, adult: Secondary | ICD-10-CM | POA: Diagnosis not present

## 2021-12-26 DIAGNOSIS — I2609 Other pulmonary embolism with acute cor pulmonale: Secondary | ICD-10-CM | POA: Diagnosis not present

## 2021-12-26 DIAGNOSIS — N1831 Chronic kidney disease, stage 3a: Secondary | ICD-10-CM | POA: Diagnosis present

## 2021-12-26 DIAGNOSIS — I10 Essential (primary) hypertension: Secondary | ICD-10-CM | POA: Diagnosis present

## 2021-12-26 DIAGNOSIS — H402233 Chronic angle-closure glaucoma, bilateral, severe stage: Secondary | ICD-10-CM | POA: Diagnosis present

## 2021-12-26 DIAGNOSIS — I2699 Other pulmonary embolism without acute cor pulmonale: Secondary | ICD-10-CM | POA: Diagnosis present

## 2021-12-26 DIAGNOSIS — E559 Vitamin D deficiency, unspecified: Secondary | ICD-10-CM | POA: Diagnosis present

## 2021-12-26 DIAGNOSIS — Z90710 Acquired absence of both cervix and uterus: Secondary | ICD-10-CM

## 2021-12-26 DIAGNOSIS — R778 Other specified abnormalities of plasma proteins: Secondary | ICD-10-CM | POA: Diagnosis present

## 2021-12-26 DIAGNOSIS — Z90722 Acquired absence of ovaries, bilateral: Secondary | ICD-10-CM

## 2021-12-26 DIAGNOSIS — I129 Hypertensive chronic kidney disease with stage 1 through stage 4 chronic kidney disease, or unspecified chronic kidney disease: Secondary | ICD-10-CM | POA: Diagnosis present

## 2021-12-26 DIAGNOSIS — Z79890 Hormone replacement therapy (postmenopausal): Secondary | ICD-10-CM

## 2021-12-26 DIAGNOSIS — Z833 Family history of diabetes mellitus: Secondary | ICD-10-CM

## 2021-12-26 DIAGNOSIS — Z7982 Long term (current) use of aspirin: Secondary | ICD-10-CM

## 2021-12-26 DIAGNOSIS — I82532 Chronic embolism and thrombosis of left popliteal vein: Secondary | ICD-10-CM

## 2021-12-26 DIAGNOSIS — J449 Chronic obstructive pulmonary disease, unspecified: Secondary | ICD-10-CM | POA: Diagnosis present

## 2021-12-26 DIAGNOSIS — E039 Hypothyroidism, unspecified: Secondary | ICD-10-CM | POA: Diagnosis present

## 2021-12-26 DIAGNOSIS — Z79899 Other long term (current) drug therapy: Secondary | ICD-10-CM

## 2021-12-26 DIAGNOSIS — Z1152 Encounter for screening for COVID-19: Secondary | ICD-10-CM

## 2021-12-26 DIAGNOSIS — R9431 Abnormal electrocardiogram [ECG] [EKG]: Secondary | ICD-10-CM | POA: Diagnosis not present

## 2021-12-26 DIAGNOSIS — Z7951 Long term (current) use of inhaled steroids: Secondary | ICD-10-CM

## 2021-12-26 DIAGNOSIS — Z87891 Personal history of nicotine dependence: Secondary | ICD-10-CM

## 2021-12-26 DIAGNOSIS — K219 Gastro-esophageal reflux disease without esophagitis: Secondary | ICD-10-CM | POA: Diagnosis present

## 2021-12-26 DIAGNOSIS — R710 Precipitous drop in hematocrit: Secondary | ICD-10-CM | POA: Diagnosis present

## 2021-12-26 DIAGNOSIS — Z8249 Family history of ischemic heart disease and other diseases of the circulatory system: Secondary | ICD-10-CM

## 2021-12-26 DIAGNOSIS — R000 Tachycardia, unspecified: Secondary | ICD-10-CM | POA: Diagnosis present

## 2021-12-26 DIAGNOSIS — Z8541 Personal history of malignant neoplasm of cervix uteri: Secondary | ICD-10-CM

## 2021-12-26 DIAGNOSIS — Z8611 Personal history of tuberculosis: Secondary | ICD-10-CM

## 2021-12-26 DIAGNOSIS — I2601 Septic pulmonary embolism with acute cor pulmonale: Secondary | ICD-10-CM | POA: Diagnosis not present

## 2021-12-26 LAB — CBC WITH DIFFERENTIAL/PLATELET
Abs Immature Granulocytes: 0.06 10*3/uL (ref 0.00–0.07)
Basophils Absolute: 0 10*3/uL (ref 0.0–0.1)
Basophils Relative: 1 %
Eosinophils Absolute: 0 10*3/uL (ref 0.0–0.5)
Eosinophils Relative: 0 %
HCT: 38 % (ref 36.0–46.0)
Hemoglobin: 12.6 g/dL (ref 12.0–15.0)
Immature Granulocytes: 1 %
Lymphocytes Relative: 18 %
Lymphs Abs: 1.4 10*3/uL (ref 0.7–4.0)
MCH: 30.3 pg (ref 26.0–34.0)
MCHC: 33.2 g/dL (ref 30.0–36.0)
MCV: 91.3 fL (ref 80.0–100.0)
Monocytes Absolute: 0.6 10*3/uL (ref 0.1–1.0)
Monocytes Relative: 8 %
Neutro Abs: 5.8 10*3/uL (ref 1.7–7.7)
Neutrophils Relative %: 72 %
Platelets: 247 10*3/uL (ref 150–400)
RBC: 4.16 MIL/uL (ref 3.87–5.11)
RDW: 12.8 % (ref 11.5–15.5)
WBC: 7.9 10*3/uL (ref 4.0–10.5)
nRBC: 0 % (ref 0.0–0.2)

## 2021-12-26 LAB — BRAIN NATRIURETIC PEPTIDE: B Natriuretic Peptide: 70.5 pg/mL (ref 0.0–100.0)

## 2021-12-26 LAB — LACTIC ACID, PLASMA: Lactic Acid, Venous: 2.1 mmol/L (ref 0.5–1.9)

## 2021-12-26 LAB — RESP PANEL BY RT-PCR (FLU A&B, COVID) ARPGX2
Influenza A by PCR: NEGATIVE
Influenza B by PCR: NEGATIVE
SARS Coronavirus 2 by RT PCR: NEGATIVE

## 2021-12-26 LAB — PROTIME-INR
INR: 1.1 (ref 0.8–1.2)
Prothrombin Time: 14.3 seconds (ref 11.4–15.2)

## 2021-12-26 LAB — COMPREHENSIVE METABOLIC PANEL
ALT: 21 U/L (ref 0–44)
AST: 20 U/L (ref 15–41)
Albumin: 3.6 g/dL (ref 3.5–5.0)
Alkaline Phosphatase: 65 U/L (ref 38–126)
Anion gap: 10 (ref 5–15)
BUN: 18 mg/dL (ref 8–23)
CO2: 25 mmol/L (ref 22–32)
Calcium: 9.8 mg/dL (ref 8.9–10.3)
Chloride: 104 mmol/L (ref 98–111)
Creatinine, Ser: 1.17 mg/dL — ABNORMAL HIGH (ref 0.44–1.00)
GFR, Estimated: 49 mL/min — ABNORMAL LOW (ref >60)
Glucose, Bld: 162 mg/dL — ABNORMAL HIGH (ref 70–99)
Potassium: 3.7 mmol/L (ref 3.5–5.1)
Sodium: 139 mmol/L (ref 135–145)
Total Bilirubin: 0.7 mg/dL (ref 0.3–1.2)
Total Protein: 7.7 g/dL (ref 6.5–8.1)

## 2021-12-26 LAB — HEPARIN LEVEL (UNFRACTIONATED): Heparin Unfractionated: <0.1 IU/mL — ABNORMAL LOW (ref 0.30–0.70)

## 2021-12-26 LAB — MAGNESIUM: Magnesium: 1.7 mg/dL (ref 1.7–2.4)

## 2021-12-26 LAB — TROPONIN I (HIGH SENSITIVITY)
Troponin I (High Sensitivity): 214 ng/L (ref <18)
Troponin I (High Sensitivity): 256 ng/L (ref <18)

## 2021-12-26 LAB — APTT: aPTT: 27 seconds (ref 24–36)

## 2021-12-26 MED ORDER — HEPARIN BOLUS VIA INFUSION
2300.0000 [IU] | Freq: Once | INTRAVENOUS | Status: AC
  Administered 2021-12-26: 2300 [IU] via INTRAVENOUS
  Filled 2021-12-26: qty 2300

## 2021-12-26 MED ORDER — CHLORHEXIDINE GLUCONATE CLOTH 2 % EX PADS
6.0000 | MEDICATED_PAD | Freq: Every day | CUTANEOUS | Status: DC
  Administered 2021-12-26: 6 via TOPICAL

## 2021-12-26 MED ORDER — IOHEXOL 350 MG/ML SOLN: 75.0000 mL | Freq: Once | INTRAVENOUS | Status: DC | PRN

## 2021-12-26 MED ORDER — HEPARIN (PORCINE) 25000 UT/250ML-% IV SOLN
1450.0000 [IU]/h | INTRAVENOUS | Status: DC
  Administered 2021-12-26: 1300 [IU]/h via INTRAVENOUS
  Administered 2021-12-27: 1450 [IU]/h via INTRAVENOUS
  Filled 2021-12-26 (×2): qty 250

## 2021-12-26 MED ORDER — IOHEXOL 350 MG/ML SOLN
75.0000 mL | Freq: Once | INTRAVENOUS | Status: AC | PRN
  Administered 2021-12-26: 75 mL via INTRAVENOUS

## 2021-12-26 MED ORDER — LEVOTHYROXINE SODIUM 175 MCG PO TABS: 175.0000 ug | ORAL_TABLET | Freq: Every day | ORAL | 1 refills | Status: DC

## 2021-12-26 MED ORDER — DOCUSATE SODIUM 100 MG PO CAPS: 100.0000 mg | ORAL_CAPSULE | Freq: Two times a day (BID) | ORAL | Status: DC | PRN

## 2021-12-26 MED ORDER — POLYETHYLENE GLYCOL 3350 17 G PO PACK: 17.0000 g | PACK | Freq: Every day | ORAL | Status: DC | PRN

## 2021-12-26 MED ORDER — METOPROLOL SUCCINATE ER 100 MG PO TB24: ORAL_TABLET | ORAL | 1 refills | Status: DC

## 2021-12-26 NOTE — Progress Notes (Signed)
ANTICOAGULATION CONSULT NOTE - Initial Consult  Pharmacy Consult for heparin Indication: pulmonary embolus  Allergies  Allergen Reactions   Doxycycline Nausea Only   Codeine Nausea And Vomiting   Pollen Extract     Patient Measurements:   Heparin Dosing Weight: 97 kg  Vital Signs: Temp: 97.5 F (36.4 C) (09/25 1523) BP: 148/97 (09/25 1824) Pulse Rate: 127 (09/25 1824)  Labs: Recent Labs    12/26/21 1533 12/26/21 1600  HGB 12.6  --   HCT 38.0  --   PLT 247  --   CREATININE 1.17*  --   TROPONINIHS  --  214*    CrCl cannot be calculated (Unknown ideal weight.).   Medical History: Past Medical History:  Diagnosis Date   Allergy    Arthritis    Cataract    Cervical cancer ([place])    GERD (gastroesophageal reflux disease)    Glaucoma 2017   History of echocardiogram    Echo 9/16:  Mild LVH, EF 55-60%, no RWMA, Gr 1 DD, mild MR // Echo 1/17:  Mild LVH, EF 50-55%, Gr 1 DD, mild to mod MR, mild LAE   Hypertension    Hyperthyroidism    s/p RAI treatment   Palpitations    PONV (postoperative nausea and vomiting)    years ago  . Woke up and saw asian people    PONV (postoperative nausea and vomiting) 07/18/2021   Retinal detachment    L eye - partial blindness   Tobacco abuse    Tuberculosis    positive test as a caregiver cxr annually    Urinary incontinence       Assessment: 71 year old female with recent Covid infection presents to ED with shortness of breath, hypoxic with EMS and placed on 3 L [place]. CTA chest positive for acute PE with evidence of right heart strain. No anticoagulation noted PTA. Pharmacy consulted to manage heparin infusion.   Baseline labs pending, CBC WNL.  Goal of Therapy:  Heparin level 0.3-0.7 units/ml Monitor platelets by anticoagulation protocol: Yes   Plan:  Per [place] calculator: -Heparin 2300 unit bolus -Start heparin infusion at 1300 units/hr -Check heparin level ~ 8 hours after start of infsuion -Daily CBC and heparin  level while on heparin infusion -Monitor for signs/symptoms of bleeding  Tawnya Crook, PharmD, BCPS Clinical Pharmacist 12/26/2021 6:56 PM

## 2021-12-26 NOTE — ED Provider Notes (Signed)
[facility] DEPT Provider Note   CSN: [id number] Arrival date & time: 12/26/21  1507     History  Chief Complaint  Patient presents with   Shortness of Breath    Kari Coleman is a 71 y.o. female.   Shortness of Breath Associated symptoms: cough      71 year old female with medical history significant for HTN, thyroid dysfunction, vitamin D deficiency, tuberculosis recent diagnosis of COVID-19 2 weeks ago who presents emergency department with shortness of breath.  Patient has had worsening shortness of breath since this past Saturday.  She was found to be hypoxic in the mid 80s with EMS and was placed on 3 L O2 via nasal cannula with improvement.  She denies any chest pain.  She denies any fevers or chills. She endorses a persistent cough. Denies any lower extremity swelling.   Home Medications Prior to Admission medications   Medication Sig Start Date End Date Taking? Authorizing Provider  acetaminophen (TYLENOL) 325 MG tablet Take 650 mg by mouth every 4 (four) hours as needed.    [provider]  albuterol (PROVENTIL HFA) 108 (90 Base) MCG/ACT inhaler Inhale 2 puffs into the lungs every 6 (six) hours as needed for wheezing or shortness of breath. 05/31/21   Inda Coke, PA  albuterol (PROVENTIL) (2.5 MG/3ML) 0.083% nebulizer solution Take 3 mLs (2.5 mg total) by nebulization every 6 (six) hours as needed for wheezing or shortness of breath. 07/12/21   Inda Coke, PA  amLODipine (NORVASC) 10 MG tablet TAKE 1 TABLET BY MOUTH  DAILY 03/10/21   Inda Coke, PA  aspirin EC 81 MG tablet Take 1 tablet (81 mg total) by mouth 2 (two) times daily after a meal. 04/22/19   Loni Dolly, PA-C  brimonidine (ALPHAGAN) 0.2 % ophthalmic solution Place 1 drop into the right eye 2 (two) times daily. 04/11/19   [provider]  budesonide-formoterol (SYMBICORT) 80-4.5 MCG/ACT inhaler Inhale 2 puffs into the lungs 2 (two) times daily. 07/15/21    Inda Coke, PA  clindamycin (CLEOCIN) 150 MG capsule Take 1 capsule (150 mg total) by mouth every 6 (six) hours. 12/04/21   Lacretia Leigh, MD  dorzolamide-timolol (COSOPT) 22.3-6.8 MG/ML ophthalmic solution Place 1 drop into the right eye 2 (two) times daily. 11/12/20   [provider]  hydrochlorothiazide (HYDRODIURIL) 25 MG tablet Take 1 tablet (25 mg total) by mouth daily. 01/07/21   Inda Coke, PA  ketorolac (ACULAR) 0.4 % SOLN Place 1 drop into the right eye 4 times daily.    [provider]  latanoprost (XALATAN) 0.005 % ophthalmic solution Place 1 drop into both eyes at bedtime. As directed 11/20/14   [provider]  levothyroxine (SYNTHROID) 175 MCG tablet Take 1 tablet (175 mcg total) by mouth daily. 12/26/21   Inda Coke, PA  lisinopril (ZESTRIL) 40 MG tablet Take 1 tablet (40 mg total) by mouth daily. 01/07/21   Inda Coke, PA  metoprolol succinate (TOPROL-XL) 100 MG 24 hr tablet TAKE 1 TABLET BY MOUTH ONCE  DAILY ; TAKE WITH OR IMMEDIATELY FOLLOWING A MEAL 12/26/21   Inda Coke, PA  pantoprazole (PROTONIX) 20 MG tablet TAKE 1 TABLET BY MOUTH DAILY 12/20/21   Richardo Priest, MD  pravastatin (PRAVACHOL) 10 MG tablet Take 1 tablet (10 mg total) by mouth daily. 08/31/21   Inda Coke, PA  prednisoLONE acetate (PRED FORTE) 1 % ophthalmic suspension Place 1 drop into the right eye 3 times daily. 08/18/21   [provider]      Allergies    Doxycycline, Codeine, and Pollen extract    Review of Systems   Review of Systems  Respiratory:  Positive for cough and shortness of breath.   All other systems reviewed and are negative.   Physical Exam Updated Vital Signs BP (!) 154/114   Pulse (!) 119   Temp 97.6 F (36.4 C)   Resp (!) 29   SpO2 96%  Physical Exam Vitals and nursing note reviewed.  Constitutional:      General: She is not in acute distress.    Appearance: She is well-developed.  HENT:     Head:  Normocephalic and atraumatic.     Mouth/Throat:     Mouth: Mucous membranes are dry.  Eyes:     Conjunctiva/sclera: Conjunctivae normal.  Cardiovascular:     Rate and Rhythm: Regular rhythm. Tachycardia present.     Heart sounds: No murmur heard. Pulmonary:     Effort: Pulmonary effort is normal. Tachypnea present. No respiratory distress.     Breath sounds: Normal breath sounds.  Abdominal:     Palpations: Abdomen is soft.     Tenderness: There is no abdominal tenderness.  Musculoskeletal:        General: No swelling.     Cervical back: Neck supple.  Skin:    General: Skin is warm and dry.     Capillary Refill: Capillary refill takes less than 2 seconds.  Neurological:     Mental Status: She is alert.  Psychiatric:        Mood and Affect: Mood normal.     ED Results / Procedures / Treatments   Labs (all labs ordered are listed, but only abnormal results are displayed) Labs Reviewed  COMPREHENSIVE METABOLIC PANEL - Abnormal; Notable for the following components:      Result Value   Glucose, Bld 162 (*)    Creatinine, Ser 1.17 (*)    GFR, Estimated 49 (*)    All other components within normal limits  HEPARIN LEVEL (UNFRACTIONATED) - Abnormal; Notable for the following components:   Heparin Unfractionated <0.10 (*)    All other components within normal limits  TROPONIN I (HIGH SENSITIVITY) - Abnormal; Notable for the following components:   Troponin I (High Sensitivity) 214 (*)    All other components within normal limits  RESP PANEL BY RT-PCR (FLU A&B, COVID) ARPGX2  CBC WITH DIFFERENTIAL/PLATELET  BRAIN NATRIURETIC PEPTIDE  MAGNESIUM  PROTIME-INR  APTT  LACTIC ACID, PLASMA  HEPARIN LEVEL (UNFRACTIONATED)  CBC  TROPONIN I (HIGH SENSITIVITY)    EKG EKG Interpretation  Date/Time:  Monday December 26 2021 15:23:37 EDT Ventricular Rate:  124 PR Interval:  125 QRS Duration: 88 QT Interval:  445 QTC Calculation: 640 R Axis:   -6 Text Interpretation: Sinus  tachycardia Anterior infarct, old Prolonged QT interval Confirmed by Regan Lemming ([id number]) on 12/26/2021 7:11:42 PM  Radiology CT Angio Chest PE W and/or Wo Contrast  Result Date: 12/26/2021 CLINICAL DATA:  Evaluate for acute pulmonary embolus. Shortness of breath and hypoxia EXAM: CT ANGIOGRAPHY CHEST WITH CONTRAST TECHNIQUE: Multidetector CT imaging of the chest was performed using the standard protocol during bolus administration of intravenous contrast. Multiplanar CT image reconstructions and MIPs were obtained to evaluate the vascular anatomy. RADIATION DOSE REDUCTION: This exam was performed according to the departmental dose-optimization program which includes automated exposure control, adjustment of the mA and/or kV according to patient size and/or use of iterative reconstruction technique. CONTRAST:  3m OMNIPAQUE IOHEXOL 350 MG/ML SOLN COMPARISON:  Coronary calcium score 08/26/2021 FINDINGS: Cardiovascular: Examination is positive for large, bilateral central obstructing pulmonary artery filling defects compatible with at least submassive pulmonary embolus. This includes a large thrombus within the left main pulmonary artery, image 56/4 and large volume clot which extends into the right lower lobe and right middle lobe pulmonary arteries,. The RV to LV ratio is calculated at 1.5 compatible with right heart strain. Heart size is upper limits of normal. Aortic atherosclerosis. No pericardial effusion. Mediastinum/Nodes: No enlarged mediastinal, hilar, or axillary lymph nodes. Thyroid gland, trachea, and esophagus demonstrate no significant findings. Lungs/Pleura: Pulmonary parenchymal detail is diminished secondary to motion artifact. No signs of pleural effusion, airspace consolidation, or pneumothorax. Mild changes of centrilobular emphysema. Upper Abdomen: No acute abnormality.  Subjective hepatic steatosis. Musculoskeletal: No acute or suspicious osseous findings. Thoracic degenerative disc disease  noted. No chest wall mass identified. Review of the MIP images confirms the above findings. IMPRESSION: 1. Examination is positive for acute PE with CT evidence of right heart strain (RV/LV Ratio = 1.5.) consistent with at least submassive (intermediate risk) PE. The presence of right heart strain has been associated with an increased risk of morbidity and mortality. Please refer to the "Code PE Focused" order set in EPIC. 2. Subjective hepatic steatosis. 3. Aortic Atherosclerosis (ICD10-I70.0) and Emphysema (ICD10-J43.9). Critical Value/emergent results were called by telephone at the time of interpretation on 12/26/2021 at 6:20 pm to [facility], who verbally acknowledged these results. Electronically Signed   By: TKerby MoorsM.D.   On: 12/26/2021 18:20    Procedures .Critical Care  Performed by: LRegan Lemming MD Authorized by: LRegan Lemming MD   Critical care provider statement:    Critical care time (minutes):  30   Critical care was time spent personally by me on the following activities:  Development of treatment plan with patient or surrogate, discussions with consultants, evaluation of patient's response to treatment, examination of patient, ordering and review of laboratory studies, ordering and review of radiographic studies, ordering and performing treatments and interventions, pulse oximetry, re-evaluation of patient's condition and review of old charts   Care discussed with: admitting provider       Medications Ordered in ED Medications  iohexol (OMNIPAQUE) 350 MG/ML injection 75 mL (has no administration in time range)  heparin ADULT infusion 100 units/mL (25000 units/2561m (1,300 Units/hr Intravenous New Bag/Given 12/26/21 1900)  docusate sodium (COLACE) capsule 100 mg (has no administration in time range)  polyethylene glycol (MIRALAX / GLYCOLAX) packet 17 g (has no administration in time range)  iohexol (OMNIPAQUE) 350 MG/ML injection 75 mL (75 mLs Intravenous  Contrast Given 12/26/21 1807)  heparin bolus via infusion 2,300 Units (2,300 Units Intravenous Bolus from Bag 12/26/21 1903)    ED Course/ Medical Decision Making/ A&P Clinical Course as of 12/26/21 2009  Mon Dec 26, 2021  1758 Troponin I (High Sensitivity)(!!): 214 [JL]    Clinical Course User Index [JL] LaRegan LemmingMD                           Medical Decision Making Amount and/or Complexity of Data Reviewed Labs: ordered. Decision-making details documented in ED Course. Radiology: ordered.  Risk Prescription drug management. Decision regarding hospitalization.     [age]ear old female with medical history significant for HTN, thyroid dysfunction, vitamin D deficiency, tuberculosis recent diagnosis of COVID-19 2 weeks ago who presents emergency department with shortness of  breath.  Patient has had worsening shortness of breath since this past Saturday.  She was found to be hypoxic in the mid 80s with EMS and was placed on 3 L O2 via nasal cannula with improvement.  She denies any chest pain.  She denies any fevers or chills. She endorses a persistent cough. Denies any lower extremity swelling.   On arrival, the patient was afebrile, temperature 97.5, tachycardic P125, tachypneic RR 26, BP 128/87, saturating 96% on room air.  Sinus tachycardia noted on cardiac telemetry.  Physical exam generally unremarkable with mild tach and tachypnea.  Primary concern is for pulmonary [place] in the setting of recent COVID-19 infection.  An EKG was performed revealed sinus tachycardia, no STEMI noted.  Laboratory work-up significant for CBC without leukocytosis or anemia, CMP generally unremarkable, magnesium normal, BNP normal at 70.5.  Initial troponin was elevated to 214.  COVID-19 and influenza PCR testing was collected and unremarkable.  CTA PE study was concerning for submassive PE with right heart strain.  The patient was started on IV heparin.  Lower concern for ACS at this time.  The  patient denies any chest pain.  Endorses persistent dyspnea.  Is without an oxygen requirement and is hemodynamically stable at this time.  Eliseo Gum of pulmonary critical care was consulted for further recommendations and management.  The patient was updated regarding her diagnosis and plan for likely admission. PCCM accepted the patient in admission.    Final Clinical Impression(s) / ED Diagnoses Final diagnoses:  Acute pulmonary embolism with acute cor pulmonale, unspecified pulmonary embolism type [facility])    Rx / DC Orders ED Discharge Orders     None         Regan Lemming, MD 12/26/21 2009

## 2021-12-26 NOTE — Telephone Encounter (Signed)
Spoke to pt's daughter [name], told her both Rx's were sent to [facility]. Crystal verbalized understanding and said pt has an appt today at 3:00 pm with Dr. Yong Channel, but her other sister just called and said she is calling an ambulance and pt may not make appt. Told her need to call back before 3:00 to let schedulers know so she is not charged. Crystal verbalized understanding.

## 2021-12-26 NOTE — Telephone Encounter (Signed)
Pt is requesting metoprolol succinate (TOPROL-XL) 100 MG 24 hr tablet be sent to [facility]. They are also requesting levothyroxine (SYNTHROID) 175 MCG tablet, but this was prescribed by Dr. Renato Shin.   Patient Name: Kari Coleman [facility] Gender: Female DOB: 1950-04-23 Age: 71 Y 3 M 8 D Return Phone Number: [phone] (Primary) Address: City/ State/ Zip: [place]  [address] Client [place] [facility] [place] Client Site [place] [facility] [place] Night Provider Inda Coke- [place] Contact Type Call Who Is Calling Patient / Member / Family / Caregiver Call Type Triage / Clinical Caller Name Maye Hides Relationship To Patient Daughter Return Phone Number [phone] (Primary) Chief Complaint Blood Pressure High Reason for Call Medication Question / Request Initial Comment Caller states mother needs her Rx sent to [facility] needs call from PCP to authorize. Metoperol, Levothyroxine - Please call to mail order pharmacy. Translation No Nurse Assessment Nurse: Marcello Moores, RN, [place] Date/Time (Eastern Time): 12/24/2021 9:36:19 PM Confirm and document reason for call. If symptomatic, describe symptoms. ---Caller states patient needs prescription refills for metoprolol and levothyroxine. States no new or worsening symptoms at this time. Does the patient have any new or worsening symptoms? ---No Please document clinical information provided and list any resource used. ---Recommended caller contact the office during regular office hours for prescription refill requests. Disp. Time Eilene Ghazi Time) Disposition Final User 12/24/2021 8:53:33 PM Send To Nurse Ria Comment, RN, April 12/24/2021 9:39:31 PM Clinical Call Yes Marcello Moores, RN, [place] Final Disposition 12/24/2021 9:39:31 PM Clinical Call Yes Marcello Moores, RN, [place]

## 2021-12-26 NOTE — Progress Notes (Signed)
eLink Physician-Brief Progress Note Patient Name: CHASADY LONGWELL DOB: 04-09-1950 MRN: [id number]   Date of Service  12/26/2021  HPI/Events of Note   71 year old female with medical history significant for HTN, thyroid dysfunction, vitamin D deficiency, tuberculosis recent diagnosis of COVID-19 2 weeks ago who presents emergency department with shortness of breath.  Placed on 3 L O2 via nasal cannula with improvement.    CTA done:  -acute PE with CT evidence of right heart strain (RV/LV Ratio = 1.5.) consistent with submassive (intermediate risk) PE.   Placed on Heparin infusion   eICU Interventions  - continue Heparin infusion  - no indication for tPA at this time, not hemodynamically unstable, but HR is 110-117  -can try low dose tPA 12.5-'25mg'$  if any acute changes  - change to Lovenox if PTT monitoring is erratic  - probable RV strain from combo of underlying TB hx, recent COVID with now superimposed PE.  -check Echo         Keveon Amsler N Maylin [place] 12/26/2021, 11:02 PM

## 2021-12-26 NOTE — ED Notes (Signed)
Pt on room air 97%, maintained.

## 2021-12-26 NOTE — ED Triage Notes (Signed)
Pt arrived via EMS, from home, c/o SOB since sat. Hypoxic mid 50s for EMS, placed on 3L [place] with good effect. COVID positive a couple wks ago.

## 2021-12-27 ENCOUNTER — Telehealth: Payer: Self-pay | Admitting: Pulmonary Disease

## 2021-12-27 ENCOUNTER — Inpatient Hospital Stay (HOSPITAL_COMMUNITY): Payer: Medicare Other

## 2021-12-27 ENCOUNTER — Other Ambulatory Visit: Payer: Self-pay

## 2021-12-27 DIAGNOSIS — R9431 Abnormal electrocardiogram [ECG] [EKG]: Secondary | ICD-10-CM

## 2021-12-27 DIAGNOSIS — I2699 Other pulmonary embolism without acute cor pulmonale: Secondary | ICD-10-CM

## 2021-12-27 DIAGNOSIS — I2601 Septic pulmonary embolism with acute cor pulmonale: Secondary | ICD-10-CM | POA: Diagnosis not present

## 2021-12-27 LAB — CBC
HCT: 37.1 % (ref 36.0–46.0)
Hemoglobin: 12.3 g/dL (ref 12.0–15.0)
MCH: 30.2 pg (ref 26.0–34.0)
MCHC: 33.2 g/dL (ref 30.0–36.0)
MCV: 91.2 fL (ref 80.0–100.0)
Platelets: 252 10*3/uL (ref 150–400)
RBC: 4.07 MIL/uL (ref 3.87–5.11)
RDW: 12.9 % (ref 11.5–15.5)
WBC: 9.4 10*3/uL (ref 4.0–10.5)
nRBC: 0 % (ref 0.0–0.2)

## 2021-12-27 LAB — BASIC METABOLIC PANEL
Anion gap: 11 (ref 5–15)
BUN: 18 mg/dL (ref 8–23)
CO2: 25 mmol/L (ref 22–32)
Calcium: 10 mg/dL (ref 8.9–10.3)
Chloride: 106 mmol/L (ref 98–111)
Creatinine, Ser: 1.1 mg/dL — ABNORMAL HIGH (ref 0.44–1.00)
GFR, Estimated: 53 mL/min — ABNORMAL LOW (ref >60)
Glucose, Bld: 134 mg/dL — ABNORMAL HIGH (ref 70–99)
Potassium: 3.3 mmol/L — ABNORMAL LOW (ref 3.5–5.1)
Sodium: 142 mmol/L (ref 135–145)

## 2021-12-27 LAB — ECHOCARDIOGRAM COMPLETE
AR max vel: 2.05 cm2
AV Area VTI: 1.86 cm2
AV Area mean vel: 2.09 cm2
AV Mean grad: 3 mmHg
AV Peak grad: 6.4 mmHg
Ao pk vel: 1.26 m/s
Area-P 1/2: 4.89 cm2
S' Lateral: 2.2 cm

## 2021-12-27 LAB — TROPONIN I (HIGH SENSITIVITY)
Troponin I (High Sensitivity): 217 ng/L (ref <18)
Troponin I (High Sensitivity): 189 ng/L (ref <18)

## 2021-12-27 LAB — LACTIC ACID, PLASMA: Lactic Acid, Venous: 1.5 mmol/L (ref 0.5–1.9)

## 2021-12-27 LAB — MRSA NEXT GEN BY PCR, NASAL: MRSA by PCR Next Gen: NOT DETECTED

## 2021-12-27 LAB — HEPARIN LEVEL (UNFRACTIONATED)
Heparin Unfractionated: 0.29 IU/mL — ABNORMAL LOW (ref 0.30–0.70)
Heparin Unfractionated: 0.39 IU/mL (ref 0.30–0.70)
Heparin Unfractionated: 0.32 IU/mL (ref 0.30–0.70)

## 2021-12-27 MED ORDER — FLUTICASONE FUROATE-VILANTEROL 100-25 MCG/ACT IN AEPB
1.0000 | INHALATION_SPRAY | Freq: Every day | RESPIRATORY_TRACT | Status: DC
  Administered 2021-12-29 – 2022-01-04 (×7): 1 via RESPIRATORY_TRACT
  Filled 2021-12-27: qty 28

## 2021-12-27 MED ORDER — METOPROLOL TARTRATE 5 MG/5ML IV SOLN: 5.0000 mg | Freq: Three times a day (TID) | INTRAVENOUS | Status: DC | PRN

## 2021-12-27 MED ORDER — NAPHAZOLINE-GLYCERIN 0.012-0.25 % OP SOLN
1.0000 [drp] | Freq: Four times a day (QID) | OPHTHALMIC | Status: DC | PRN
  Administered 2021-12-28: 1 [drp] via OPHTHALMIC
  Filled 2021-12-27: qty 15

## 2021-12-27 MED ORDER — POTASSIUM CHLORIDE CRYS ER 20 MEQ PO TBCR
20.0000 meq | EXTENDED_RELEASE_TABLET | ORAL | Status: AC
  Administered 2021-12-27 (×2): 20 meq via ORAL
  Filled 2021-12-27 (×3): qty 1

## 2021-12-27 MED ORDER — METOPROLOL TARTRATE 5 MG/5ML IV SOLN: 5.0000 mg | Freq: Four times a day (QID) | INTRAVENOUS | Status: DC | PRN

## 2021-12-27 MED ORDER — LEVOTHYROXINE SODIUM 75 MCG PO TABS
175.0000 ug | ORAL_TABLET | Freq: Every day | ORAL | Status: DC
  Administered 2021-12-28 – 2022-01-04 (×8): 175 ug via ORAL
  Filled 2021-12-27 (×8): qty 1

## 2021-12-27 MED ORDER — POTASSIUM CHLORIDE 10 MEQ/100ML IV SOLN
INTRAVENOUS | Status: AC
  Administered 2021-12-27: 10 meq via INTRAVENOUS
  Filled 2021-12-27: qty 100

## 2021-12-27 MED ORDER — AMLODIPINE BESYLATE 10 MG PO TABS
10.0000 mg | ORAL_TABLET | Freq: Every day | ORAL | Status: DC
  Administered 2021-12-27 – 2021-12-29 (×3): 10 mg via ORAL
  Filled 2021-12-27 (×3): qty 1

## 2021-12-27 MED ORDER — POTASSIUM CHLORIDE 10 MEQ/100ML IV SOLN
10.0000 meq | INTRAVENOUS | Status: AC
  Administered 2021-12-27 (×3): 10 meq via INTRAVENOUS
  Filled 2021-12-27 (×3): qty 100

## 2021-12-27 MED ORDER — ORAL CARE MOUTH RINSE: 15.0000 mL | OROMUCOSAL | Status: DC | PRN

## 2021-12-27 NOTE — Progress Notes (Signed)
[facility] ADULT ICU REPLACEMENT PROTOCOL   The patient does apply for the [facility] Adult ICU Electrolyte Replacment Protocol based on the criteria listed below:   1.Exclusion criteria: TCTS patients, ECMO patients, and Dialysis patients 2. Is GFR >/= 30 ml/min? Yes.    Patient's GFR today is 53 3. Is SCr </= 2? Yes.   Patient's SCr is 1.10 mg/dL 4. Did SCr increase >/= 0.5 in 24 hours? No. 5.Pt's weight >40kg  Yes.   6. Abnormal electrolyte(s): K+3.3  7. Electrolytes replaced per protocol 8.  Call MD STAT for K+ </= 2.5, Phos </= 1, or Mag </= 1 Physician:  Dr Karmen Bongo, [place] Grumbling 12/27/2021 5:40 AM

## 2021-12-27 NOTE — Progress Notes (Signed)
ANTICOAGULATION CONSULT NOTE - Follow Up Consult  Pharmacy Consult for heparin Indication: acute pulmonary embolus and DVT  Allergies  Allergen Reactions   Doxycycline Nausea Only   Codeine Nausea And Vomiting   Pollen Extract Other (See Comments)    Unknown reaction     Patient Measurements: Height: '5\' 7"'$  (170.2 cm) Weight: 93.1 kg (205 lb 4 oz) IBW/kg (Calculated) : 61.6 Heparin Dosing Weight:   Vital Signs: Temp: 98.2 F (36.8 C) (09/26 2120) Temp Source: Oral (09/26 2120) BP: 142/88 (09/26 2120) Pulse Rate: 101 (09/26 2120)  Labs: Recent Labs    12/26/21 1533 12/26/21 1600 12/26/21 1824 12/26/21 1848 12/27/21 0313 12/27/21 0539 12/27/21 1313 12/27/21 2122  HGB 12.6  --   --   --  12.3  --   --   --   HCT 38.0  --   --   --  37.1  --   --   --   PLT 247  --   --   --  252  --   --   --   APTT  --   --   --  27  --   --   --   --   LABPROT  --   --   --  14.3  --   --   --   --   INR  --   --   --  1.1  --   --   --   --   HEPARINUNFRC  --    < > <0.10*  --  0.29*  --  0.39 0.32  CREATININE 1.17*  --   --   --  1.10*  --   --   --   TROPONINIHS  --    < > 256*  --  217* 189*  --   --    < > = values in this interval not displayed.     Estimated Creatinine Clearance: 53.4 mL/min (A) (by C-G formula based on SCr of 1.1 mg/dL (H)).   Assessment: Patient is a 71 y.o F with recent COVID-19 infection who presented to the ED on 12/26/21 with c/o SOB.  Chest Ct showed acute PE with evidence of RHS and LLE doppler came back positive for LLE DVT.  She's currently on heparin drip for VTE treatment.  Today, 12/27/2021: - heparin level collected 2122 remains therapeutic at 0.32 on IV heparin 1450 units/hr this - cbc stable - No bleeding or infusion related issues reported by RN  Goal of Therapy:  Heparin level 0.3-0.7 units/ml Monitor platelets by anticoagulation protocol: Yes   Plan:  - continue IV heparin drip at 1450 units/hr - daily heparin level & CBC -  monitor for s/sx bleeding  Netta Cedars PharmD 12/27/2021,10:27 PM

## 2021-12-27 NOTE — Telephone Encounter (Signed)
Please make arrangements for hospital follow up appointment with Cheyne Bungert in 4-6 weeks for acute provoked pulmonary embolism

## 2021-12-27 NOTE — Progress Notes (Addendum)
Bilateral lower extremity venous duplex completed. Refer to "CV Proc" under chart review to view preliminary results.  Preliminary results discussed with Georgann Housekeeper, NP.  12/27/2021 9:18 AM Kelby Aline., MHA, RVT, RDCS, RDMS

## 2021-12-27 NOTE — Progress Notes (Signed)
   NAME:  Kari Coleman, MRN:  [id number], DOB:  Jan 12, 1951, LOS: 1 ADMISSION DATE:  12/26/2021, CONSULTATION DATE:  12/27/21 REFERRING MD:  EDP, CHIEF COMPLAINT:  sob   History of Present Illness:  71 yo female with pmh HTN, hypothyroid and recent covid infection (august 2023) who presented to ed after 3 days of sob with exertion, progressing to at rest. She states she has not had any new cough, fever, chills, no cp, dizziness, pre-syncopal symptoms. She endorses she has also had decreased appetite since Sunday but no n/v/d. She decided to present to the ED 2/2 worsening sob and was found to have Bilateral Pe's with a considerable burden. However, thankfully she has remained with sats >96% on room air, hemodynamically stable. She is tachycardic to 120 but otherwise doing well. She was started on heparin in the ed.   Pertinent  Medical History  Htn, hyperlipidemia, hypothyroidism, copd( presumed, no pft's on file)  Significant Hospital Events: Including procedures, antibiotic start and stop dates in addition to other pertinent events   9/25: admitted to [facility]  Interim History / Subjective:  No acute events overnight. Maintaining O2 sat 96% on room air.  Trop, BMP, lactic have peaked and are trending down.  No complaints this morning  Objective   Blood pressure (!) 151/87, pulse 99, temperature 98.4 F (36.9 C), temperature source Oral, resp. rate 20, SpO2 97 %.       No intake or output data in the 24 hours ending 12/27/21 0919 There were no vitals filed for this visit.  Examination: General: aaox4, sitting up conversing with ease with daughter  HENT: ncat, eomi, mm dry but pink Lungs: ctab Cardiovascular: sinus tachy, no murmur gallop rub Abdomen: soft, mildly distended no tenderness, rebound, or guarding Extremities: no clubbing cyanosis. L >>R in size with some mild tenderness on posterior palpation but no cord palpated. R Knee with chronic, healed incision  Neuro: no focal  deficits GU: deferred  Resolved Hospital Problem list     Assessment & Plan:   Acute pulmonary emboli: bilateral central clot. RV/LV 1.5. Troponin and lactic mildly elevated on presentation, but trending down. Has not required oxygen. Likely provoked in the setting of recent hospitalization.  Left popliteal DVT -Heparin infusion per pharmacy -Echo pending -Await echo before fully ruling out need for IR lytics vs thrombectomy, but hopefuly she will be able to avoid.   Htn:  -Holding home lisinopril, amlodipine, HCTZ pending echo/IR decision. Can use PRNs in the mean time if necessary.  Hypothyroid:  -resume synthroid  COPD: without acute exacerbation -Resume home symbicort (breo forumlary)  CKD 3 - trend BMP  Best Practice (right click and "Reselect all SmartList Selections" daily)   Diet/type: clear liquids DVT prophylaxis: systemic heparin GI prophylaxis: N/A Lines: N/A Foley:  N/A Code Status:  full code Last date of multidisciplinary goals of care discussion [discussed code status with pt and daughter. Pt states she would not want to be resuscitated but it has been hard to discuss with her family. Her daughter acknowledged her wishes but requested her mother to allow all the siblings to come speak with her before she decided. ]   Critical care time:      Georgann Housekeeper, AGACNP-BC [place] for personal pager PCCM on call pager [phone] until 7pm. Please call Elink 7p-7a. [phone]  12/27/2021 9:46 AM

## 2021-12-27 NOTE — H&P (Signed)
NAME:  Kari Coleman, MRN:  [id number], DOB:  [birth year], LOS: 1 ADMISSION DATE:  12/26/2021, CONSULTATION DATE:  12/27/21 REFERRING MD:  EDP, CHIEF COMPLAINT:  sob   History of Present Illness:  71 yo female with pmh HTN, hypothyroid and recent covid infection (august 2023) who presented to ed after 3 days of sob with exertion, progressing to at rest. She states she has not had any new cough, fever, chills, no cp, dizziness, pre-syncopal symptoms. She endorses she has also had decreased appetite since Sunday but no n/v/d. She decided to present to the ED 2/2 worsening sob and was found to have Bilateral Pe's with a considerable burden. However, thankfully she has remained with sats >96% on room air, hemodynamically stable. She is tachycardic to 120 but otherwise doing well. She was started on heparin in the ed.   The pt denies any recent periods of immobility, long trips, recent surgeries.  She states she does not smoke but used to have extensive history, quitting ~5 years ago. Her daughter does state that since the patient had covid and was in the hospital for ~1 week on mid august she has been in bed quite a bit. Pt sates she does mobilize around her residence but perhaps has been in bed more than usual. Pt also states that her L leg has felt more swollen of late as well as some tenderness posteriorly in the knee.   At the time of my exam, pt is sitting up, able to converse in full sentences without any dyspnea. She is tachycardic but not tachypneic. She has no absolute contraindications to tpa but at this time without any other changes we will defer to follow trops, echo and le dopplers. Suspect may be a better candidate for thrombectomy vs catheter directed tpa considering the burden and proximity but will await studies.   Pertinent  Medical History  Htn, hyperlipidemia, hypothyroidism, copd( presumed, no pft's on file)  Significant Hospital Events: Including procedures, antibiotic start and stop  dates in addition to other pertinent events   9/25: admitted to [facility]  Interim History / Subjective:    Objective   Blood pressure (!) 153/102, pulse (!) 113, temperature 98.1 F (36.7 C), temperature source Oral, resp. rate 20, SpO2 96 %.       No intake or output data in the 24 hours ending 12/27/21 0048 There were no vitals filed for this visit.  Examination: General: aaox4, sitting up conversing with ease with daughter  HENT: ncat, eomi, mm dry but pink Lungs: ctab Cardiovascular: sinus tachy, no murmur gallop rub Abdomen: soft, mildly distended no tenderness, rebound, or guarding Extremities: no clubbing cyanosis. L >>R in size with some mild tenderness on posterior palpation but no cord palpated. R Knee with chronic, healed incision  Neuro: no focal deficits GU: deferred  Resolved Hospital Problem list     Assessment & Plan:  Acute pulmonary emboli:  -bilateral and submassive -cont heparin -await echo and LE duplex -trend trop -bnp 70 -based on above studies consider IR for thrombectomy vs catheter directed tpa -if decompensates would give tpa without any absolute contraindications   Htn:  -stable -hold oral meds at this time but prn if needed  Hypothyroid:  -resume when able.   H.o copd:  -without acute exacerbation -cont inhalers as needed  Best Practice (right click and "Reselect all SmartList Selections" daily)   Diet/type: clear liquids DVT prophylaxis: systemic heparin GI prophylaxis: N/A Lines: N/A Foley:  N/A Code Status:  full code Last date of multidisciplinary goals of care discussion [discussed code status with pt and daughter. Pt states she would not want to be resuscitated but it has been hard to discuss with her family. Her daughter acknowledged her wishes but requested her mother to allow all the siblings to come speak with her before she decided. ]  Labs   CBC: Recent Labs  Lab 12/26/21 1533  WBC 7.9  NEUTROABS 5.8  HGB 12.6  HCT  38.0  MCV 91.3  PLT 034    Basic Metabolic Panel: Recent Labs  Lab 12/26/21 1533  NA 139  K 3.7  CL 104  CO2 25  GLUCOSE 162*  BUN 18  CREATININE 1.17*  CALCIUM 9.8  MG 1.7   GFR: CrCl cannot be calculated (Unknown ideal weight.). Recent Labs  Lab 12/26/21 1533 12/26/21 1824  WBC 7.9  --   LATICACIDVEN  --  2.1*    Liver Function Tests: Recent Labs  Lab 12/26/21 1533  AST 20  ALT 21  ALKPHOS 65  BILITOT 0.7  PROT 7.7  ALBUMIN 3.6   No results for input(s): "LIPASE", "AMYLASE" in the last 168 hours. No results for input(s): "AMMONIA" in the last 168 hours.  ABG No results found for: "PHART", "PCO2ART", "PO2ART", "HCO3", "TCO2", "ACIDBASEDEF", "O2SAT"   Coagulation Profile: Recent Labs  Lab 12/26/21 1848  INR 1.1    Cardiac Enzymes: No results for input(s): "CKTOTAL", "CKMB", "CKMBINDEX", "TROPONINI" in the last 168 hours.  HbA1C: Hemoglobin A1C  Date/Time Value Ref Range Status  09/15/2020 12:00 AM 5.4  Final    Comment:    POC done   Hgb A1c MFr Bld  Date/Time Value Ref Range Status  02/18/2021 02:46 PM 5.7 4.6 - 6.5 % Final    Comment:    Glycemic Control Guidelines for People with Diabetes:Non Diabetic:  <6%Goal of Therapy: <7%Additional Action Suggested:  >8%   11/10/2019 02:16 PM 5.8 (H) <5.7 % of total Hgb Final    Comment:    For someone without known diabetes, a hemoglobin  A1c value between 5.7% and 6.4% is consistent with prediabetes and should be confirmed with a  follow-up test. . For someone with known diabetes, a value <7% indicates that their diabetes is well controlled. A1c targets should be individualized based on duration of diabetes, age, comorbid conditions, and other considerations. . This assay result is consistent with an increased risk of diabetes. . Currently, no consensus exists regarding use of hemoglobin A1c for diagnosis of diabetes for children. .     CBG: No results for input(s): "GLUCAP" in the  last 168 hours.  Review of Systems:   Sob with rest or movement since Saturday, decreased appetite since Sunday. No cough, chest pain, hypoxia. Daughter endorses decreased mobility since her covid infection in mid august (aug 14) that warranted a week stay in the hospital.   Past Medical History:  She,  has a past medical history of Allergy, Arthritis, Cataract, Cervical cancer ([place]), GERD (gastroesophageal reflux disease), Glaucoma (2017), History of echocardiogram, Hypertension, Hyperthyroidism, Palpitations, PONV (postoperative nausea and vomiting), PONV (postoperative nausea and vomiting) (07/18/2021), Retinal detachment, Tobacco abuse, Tuberculosis, and Urinary incontinence.   Surgical History:   Past Surgical History:  Procedure Laterality Date   ABDOMINAL HYSTERECTOMY  1984   bladder surgey for incontinence     EYE SURGERY     REPAIR OF COMPLEX TRACTION RETINAL DETACHMENT     thyroid radiation     TOTAL ABDOMINAL HYSTERECTOMY W/  BILATERAL SALPINGOOPHORECTOMY     TOTAL KNEE ARTHROPLASTY Right 04/22/2019   Procedure: RIGHT TOTAL KNEE ARTHROPLASTY;  Surgeon: Melrose Nakayama, MD;  Location: [facility];  Service: Orthopedics;  Laterality: Right;  PENDING PRE-OP FOR SDDC APPROVAL     Social History:   reports that she quit smoking about 5 years ago. Her smoking use included cigarettes. She started smoking about 53 years ago. She has a 24.00 pack-year smoking history. She has never used smokeless tobacco. She reports that she does not currently use alcohol. She reports that she does not use drugs.   Family History:  Her family history includes Cancer in her maternal grandfather, paternal aunt, and sister; Colon cancer in her maternal aunt; Diabetes in her maternal uncle, mother, and paternal grandmother; Heart failure in her mother; Hypertension in her mother; Pancreatic cancer in her father; Thyroid disease in her paternal aunt and another family member. There is no history of Colon polyps or  Stomach cancer.   Allergies Allergies  Allergen Reactions   Doxycycline Nausea Only   Codeine Nausea And Vomiting   Pollen Extract      Home Medications  Prior to Admission medications   Medication Sig Start Date End Date Taking? Authorizing Provider  acetaminophen (TYLENOL) 325 MG tablet Take 650 mg by mouth every 4 (four) hours as needed.    [provider]  albuterol (PROVENTIL HFA) 108 (90 Base) MCG/ACT inhaler Inhale 2 puffs into the lungs every 6 (six) hours as needed for wheezing or shortness of breath. 05/31/21   Inda Coke, PA  albuterol (PROVENTIL) (2.5 MG/3ML) 0.083% nebulizer solution Take 3 mLs (2.5 mg total) by nebulization every 6 (six) hours as needed for wheezing or shortness of breath. 07/12/21   Inda Coke, PA  amLODipine (NORVASC) 10 MG tablet TAKE 1 TABLET BY MOUTH  DAILY 03/10/21   Inda Coke, PA  aspirin EC 81 MG tablet Take 1 tablet (81 mg total) by mouth 2 (two) times daily after a meal. 04/22/19   Loni Dolly, PA-C  brimonidine (ALPHAGAN) 0.2 % ophthalmic solution Place 1 drop into the right eye 2 (two) times daily. 04/11/19   [provider]  budesonide-formoterol (SYMBICORT) 80-4.5 MCG/ACT inhaler Inhale 2 puffs into the lungs 2 (two) times daily. 07/15/21   Inda Coke, PA  clindamycin (CLEOCIN) 150 MG capsule Take 1 capsule (150 mg total) by mouth every 6 (six) hours. 12/04/21   Lacretia Leigh, MD  dorzolamide-timolol (COSOPT) 22.3-6.8 MG/ML ophthalmic solution Place 1 drop into the right eye 2 (two) times daily. 11/12/20   [provider]  hydrochlorothiazide (HYDRODIURIL) 25 MG tablet Take 1 tablet (25 mg total) by mouth daily. 01/07/21   Inda Coke, PA  ketorolac (ACULAR) 0.4 % SOLN Place 1 drop into the right eye 4 times daily.    [provider]  latanoprost (XALATAN) 0.005 % ophthalmic solution Place 1 drop into both eyes at bedtime. As directed 11/20/14   [provider]  levothyroxine  (SYNTHROID) 175 MCG tablet Take 1 tablet (175 mcg total) by mouth daily. 12/26/21   Inda Coke, PA  lisinopril (ZESTRIL) 40 MG tablet Take 1 tablet (40 mg total) by mouth daily. 01/07/21   Inda Coke, PA  metoprolol succinate (TOPROL-XL) 100 MG 24 hr tablet TAKE 1 TABLET BY MOUTH ONCE  DAILY ; TAKE WITH OR IMMEDIATELY FOLLOWING A MEAL 12/26/21   Inda Coke, PA  pantoprazole (PROTONIX) 20 MG tablet TAKE 1 TABLET BY MOUTH DAILY 12/20/21   Richardo Priest, MD  pravastatin (PRAVACHOL) 10 MG tablet Take 1 tablet (10 mg total) by mouth daily. 08/31/21   Inda Coke, PA  prednisoLONE acetate (PRED FORTE) 1 % ophthalmic suspension Place 1 drop into the right eye 3 times daily. 08/18/21   [provider]     Critical care time: 68mn

## 2021-12-27 NOTE — Plan of Care (Signed)
  Problem: Clinical Measurements: Goal: Respiratory complications will improve Outcome: Progressing   Problem: Nutrition: Goal: Adequate nutrition will be maintained Outcome: Progressing   Problem: Pain Managment: Goal: General experience of comfort will improve Outcome: Progressing   Problem: Clinical Measurements: Goal: Cardiovascular complication will be avoided Outcome: Not Progressing   Problem: Activity: Goal: Risk for activity intolerance will decrease Outcome: Not Progressing   Problem: Clinical Measurements: Goal: Respiratory complications will improve 12/27/2021 0210 by Cristino Martes, RN Outcome: Progressing 12/27/2021 0210 by Cristino Martes, RN Outcome: Progressing   Problem: Nutrition: Goal: Adequate nutrition will be maintained 12/27/2021 0210 by Cristino Martes, RN Outcome: Progressing 12/27/2021 0210 by Cristino Martes, RN Outcome: Progressing   Problem: Pain Managment: Goal: General experience of comfort will improve 12/27/2021 0210 by Cristino Martes, RN Outcome: Progressing 12/27/2021 0210 by Cristino Martes, RN Outcome: Progressing   Problem: Skin Integrity: Goal: Risk for impaired skin integrity will decrease Outcome: Progressing   Problem: Clinical Measurements: Goal: Cardiovascular complication will be avoided 12/27/2021 0210 by Cristino Martes, RN Outcome: Not Progressing 12/27/2021 0210 by Cristino Martes, RN Outcome: Not Progressing   Problem: Activity: Goal: Risk for activity intolerance will decrease 12/27/2021 0210 by Cristino Martes, RN Outcome: Not Progressing 12/27/2021 0210 by Cristino Martes, RN Outcome: Not Progressing

## 2021-12-27 NOTE — Telephone Encounter (Signed)
Patient scheduled 02/01/2022 at 11:15am with BQ- reminder mailed to address on file. Nothing further needed.

## 2021-12-27 NOTE — Progress Notes (Signed)
ANTICOAGULATION CONSULT NOTE  Pharmacy Consult for heparin Indication: pulmonary embolus  Allergies  Allergen Reactions   Doxycycline Nausea Only   Codeine Nausea And Vomiting   Pollen Extract     Patient Measurements:   Heparin Dosing Weight: 97 kg  Vital Signs: Temp: 99.4 F (37.4 C) (09/26 0400) Temp Source: Oral (09/26 0400) BP: 157/98 (09/26 0000) Pulse Rate: 111 (09/26 0000)  Labs: Recent Labs    12/26/21 1533 12/26/21 1600 12/26/21 1824 12/26/21 1848 12/27/21 0313  HGB 12.6  --   --   --  12.3  HCT 38.0  --   --   --  37.1  PLT 247  --   --   --  252  APTT  --   --   --  27  --   LABPROT  --   --   --  14.3  --   INR  --   --   --  1.1  --   HEPARINUNFRC  --   --  <0.10*  --  0.29*  CREATININE 1.17*  --   --   --  1.10*  TROPONINIHS  --  214* 256*  --  217*     CrCl cannot be calculated (Unknown ideal weight.).   Medical History: Past Medical History:  Diagnosis Date   Allergy    Arthritis    Cataract    Cervical cancer ([place])    GERD (gastroesophageal reflux disease)    Glaucoma 2017   History of echocardiogram    Echo 9/16:  Mild LVH, EF 55-60%, no RWMA, Gr 1 DD, mild MR // Echo 1/17:  Mild LVH, EF 50-55%, Gr 1 DD, mild to mod MR, mild LAE   Hypertension    Hyperthyroidism    s/p RAI treatment   Palpitations    PONV (postoperative nausea and vomiting)    years ago  . Woke up and saw asian people    PONV (postoperative nausea and vomiting) 07/18/2021   Retinal detachment    L eye - partial blindness   Tobacco abuse    Tuberculosis    positive test as a caregiver cxr annually    Urinary incontinence       Assessment: 71 year old female with recent Covid infection presents to ED with shortness of breath, hypoxic with EMS and placed on 3 L [place]. CTA chest positive for acute PE with evidence of right heart strain. No anticoagulation noted PTA. Pharmacy consulted to manage heparin infusion.   Baseline labs WNL (aptt, INR,  CBC).  12/27/2021: Initial heparin level 0.29- just below goal range on IV heparin 1300 units/hr CBC: Hg, pltc WNL/stable No bleeding or infusion related issues reported by RN  Goal of Therapy:  Heparin level 0.3-0.7 units/ml Monitor platelets by anticoagulation protocol: Yes   Plan:  -Increase heparin infusion to 1450 units/hr -Recheck heparin level ~ 8 hours after rate increase -Daily CBC and heparin level while on heparin infusion -Monitor for signs/symptoms of bleeding  Netta Cedars, PharmD, BCPS Clinical Pharmacist 12/27/2021 4:43 AM

## 2021-12-27 NOTE — Progress Notes (Signed)
ANTICOAGULATION CONSULT NOTE - Follow Up Consult  Pharmacy Consult for heparin Indication: acute pulmonary embolus and DVT  Allergies  Allergen Reactions   Doxycycline Nausea Only   Codeine Nausea And Vomiting   Pollen Extract Other (See Comments)    Unknown reaction     Patient Measurements:   Heparin Dosing Weight:   Vital Signs: Temp: 98.4 F (36.9 C) (09/26 0800) Temp Source: Oral (09/26 0800) BP: 151/87 (09/26 0700) Pulse Rate: 99 (09/26 0800)  Labs: Recent Labs    12/26/21 1533 12/26/21 1600 12/26/21 1824 12/26/21 1848 12/27/21 0313 12/27/21 0539  HGB 12.6  --   --   --  12.3  --   HCT 38.0  --   --   --  37.1  --   PLT 247  --   --   --  252  --   APTT  --   --   --  27  --   --   LABPROT  --   --   --  14.3  --   --   INR  --   --   --  1.1  --   --   HEPARINUNFRC  --   --  <0.10*  --  0.29*  --   CREATININE 1.17*  --   --   --  1.10*  --   TROPONINIHS  --    < > 256*  --  217* 189*   < > = values in this interval not displayed.    CrCl cannot be calculated (Unknown ideal weight.).   Assessment: Patient is a 71 y.o F with recent COVID-19 infection who presented to the ED on 12/26/21 with c/o SOB.  Chest Ct showed acute PE with evidence of RHS and LLE doppler came back positive for LLE DVT.  She's currently on heparin drip for VTE treatment.  Today, 12/27/2021: - heparin level collected 1p at is therapeutic at 0.39 with drip increased to 1450 units/hr this morning - cbc stable - no bleeding documented   Goal of Therapy:  Heparin level 0.3-0.7 units/ml Monitor platelets by anticoagulation protocol: Yes   Plan:  - continue heparin drip at 1450 units/hr - check heparin level at 9p to ensure level is still at goal before changing to daily monitoring - monitor for s/sx bleeding  Dia Sitter P 12/27/2021,11:21 AM

## 2021-12-28 ENCOUNTER — Other Ambulatory Visit: Payer: Self-pay | Admitting: *Deleted

## 2021-12-28 ENCOUNTER — Telehealth (HOSPITAL_COMMUNITY): Payer: Self-pay

## 2021-12-28 ENCOUNTER — Other Ambulatory Visit (HOSPITAL_COMMUNITY): Payer: Self-pay

## 2021-12-28 DIAGNOSIS — I82532 Chronic embolism and thrombosis of left popliteal vein: Secondary | ICD-10-CM

## 2021-12-28 DIAGNOSIS — I2601 Septic pulmonary embolism with acute cor pulmonale: Secondary | ICD-10-CM | POA: Diagnosis not present

## 2021-12-28 LAB — CBC
HCT: 39.1 % (ref 36.0–46.0)
Hemoglobin: 12.9 g/dL (ref 12.0–15.0)
MCH: 30.1 pg (ref 26.0–34.0)
MCHC: 33 g/dL (ref 30.0–36.0)
MCV: 91.1 fL (ref 80.0–100.0)
Platelets: 253 10*3/uL (ref 150–400)
RBC: 4.29 MIL/uL (ref 3.87–5.11)
RDW: 12.9 % (ref 11.5–15.5)
WBC: 8.6 10*3/uL (ref 4.0–10.5)
nRBC: 0 % (ref 0.0–0.2)

## 2021-12-28 LAB — HEPARIN LEVEL (UNFRACTIONATED): Heparin Unfractionated: 0.38 IU/mL (ref 0.30–0.70)

## 2021-12-28 MED ORDER — PRAVASTATIN SODIUM 20 MG PO TABS
10.0000 mg | ORAL_TABLET | Freq: Every day | ORAL | Status: DC
  Administered 2021-12-28 – 2022-01-04 (×8): 10 mg via ORAL
  Filled 2021-12-28 (×8): qty 1

## 2021-12-28 MED ORDER — PANTOPRAZOLE SODIUM 20 MG PO TBEC
20.0000 mg | DELAYED_RELEASE_TABLET | Freq: Every day | ORAL | Status: DC
  Administered 2021-12-28 – 2021-12-31 (×4): 20 mg via ORAL
  Filled 2021-12-28 (×4): qty 1

## 2021-12-28 MED ORDER — METOPROLOL SUCCINATE ER 100 MG PO TB24: ORAL_TABLET | ORAL | 1 refills | Status: DC

## 2021-12-28 MED ORDER — PREDNISOLONE ACETATE 1 % OP SUSP
1.0000 [drp] | OPHTHALMIC | Status: DC
  Administered 2021-12-28 – 2022-01-03 (×44): 1 [drp] via OPHTHALMIC
  Filled 2021-12-28: qty 5

## 2021-12-28 MED ORDER — APIXABAN 5 MG PO TABS
5.0000 mg | ORAL_TABLET | Freq: Two times a day (BID) | ORAL | Status: DC
  Administered 2022-01-04: 5 mg via ORAL
  Filled 2021-12-28: qty 1

## 2021-12-28 MED ORDER — LATANOPROST 0.005 % OP SOLN
1.0000 [drp] | Freq: Every day | OPHTHALMIC | Status: DC
  Administered 2021-12-28 – 2022-01-03 (×7): 1 [drp] via OPHTHALMIC
  Filled 2021-12-28: qty 2.5

## 2021-12-28 MED ORDER — LEVOTHYROXINE SODIUM 175 MCG PO TABS: 175.0000 ug | ORAL_TABLET | Freq: Every day | ORAL | 1 refills | Status: DC

## 2021-12-28 MED ORDER — ALBUTEROL SULFATE (2.5 MG/3ML) 0.083% IN NEBU: 2.5000 mg | INHALATION_SOLUTION | Freq: Four times a day (QID) | RESPIRATORY_TRACT | Status: DC | PRN

## 2021-12-28 MED ORDER — APIXABAN 5 MG PO TABS
10.0000 mg | ORAL_TABLET | Freq: Two times a day (BID) | ORAL | Status: AC
  Administered 2021-12-28 – 2022-01-03 (×14): 10 mg via ORAL
  Filled 2021-12-28 (×14): qty 2

## 2021-12-28 NOTE — Progress Notes (Signed)
Progress Note   Patient: Kari Coleman DOB: [birth year] DOA: 12/26/2021     2 DOS: the patient was seen and examined on 12/28/2021   Brief hospital course: Taken from prior notes.  71 yo female with pmh HTN, hypothyroid and recent covid infection (august 2023) who presented to ed after 3 days of sob with exertion, progressing to at rest. She states she has not had any new cough, fever, chills, no cp, dizziness, pre-syncopal symptoms. She endorses she has also had decreased appetite since Sunday but no n/v/d.  On arrival she was hemodynamically stable except tachycardia to 120s.  Saturating in mid 90s on room air. Found to have bilateral central PEs with considerable clot burden. Admitted with PCCM and started on heparin infusion. Echocardiogram with normal EF, grade 1 diastolic dysfunction and no comment on any right heart strain.  Pressure seems within normal limit. Lower extremity venous Doppler with left popliteal and peroneal vein DVT.  PCCM is suggesting switching to Eliquis for 95-monthfor concern of significant provoked PE, most likely secondary to recent hospitalization and COVID infection. They were also suggesting repeating echocardiogram in 673-monthMildly elevated troponin which peaked at 256 most likely secondary to demand ischemia.  9/27: Patient hemodynamically stable.  Mild tachycardia.  Still have some shortness of breath.  After multiple back-and-forth with insurance and family, we will proceed with Eliquis, patient had one-time payment of more than 400 with family agrees. Able to wean her off from oxygen. We will keep her for another night and possible discharge tomorrow.   Assessment and Plan: * Acute pulmonary [place] likely provoked with recent COVID infection and hospitalization. Initially treated with heparin infusion. Echocardiogram with no concern of right heart strain. Able to wean off from oxygen. -Switch heparin with  Eliquis -Holding home aspirin to reduce the risk of bleeding -Patient will need 6-43-month anticoagulation -Follow-up with pulmonary as an outpatient  Chronic deep vein thrombosis (DVT) of left popliteal vein (HCC) - See above  Hypertension Blood pressure currently within goal. -Continue home amlodipine -Keep holding home metoprolol, HCTZ and lisinopril-can restart if needed -Continue to monitor  Hypothyroidism - Continue home Synthroid  Chronic angle-closure glaucoma of both eyes, severe stage - Continue home meds  GERD (gastroesophageal reflux disease) - Continue PPI   Subjective: Patient was seen and examined today.  No chest pain, continue to have some shortness of breath.  No baseline oxygen use.  Physical Exam: Vitals:   12/28/21 0822 12/28/21 0850 12/28/21 0918 12/28/21 1301  BP:  (!) 138/90 (!) 144/88 111/81  Pulse:  93 (!) 102 (!) 101  Resp:  '18 18 16  '$ Temp:  98.7 F (37.1 C) 99.6 F (37.6 C) 98.1 F (36.7 C)  TempSrc:  Oral Oral Oral  SpO2: 93% 97% 98% 95%  Weight:      Height:       General.  Obese elderly lady, in no acute distress. Pulmonary.  Lungs clear bilaterally, normal respiratory effort. CV.  Regular rate and rhythm, no JVD, rub or murmur. Abdomen.  Soft, nontender, nondistended, BS positive. CNS.  Alert and oriented .  No focal neurologic deficit. Extremities.  No edema, no cyanosis, pulses intact and symmetrical. Psychiatry.  Judgment and insight appears normal.  Data Reviewed: Prior data reviewed  Family Communication: Discussed with daughter on phone  Disposition: Status is: Inpatient Remains inpatient appropriate because: Severity of illness   Planned Discharge Destination: Home  DVT prophylaxis.  Eliquis Time spent: 50 minutes  This record has been created using Systems analyst. Errors have been sought and corrected,but may not always be located. Such creation errors do not reflect on the standard of  care.  Author: Lorella Nimrod, MD 12/28/2021 4:11 PM  For on call review [email or web address].

## 2021-12-28 NOTE — Assessment & Plan Note (Signed)
-   Continue home meds °

## 2021-12-28 NOTE — Telephone Encounter (Signed)
Pharmacy Patient Advocate Encounter  Insurance verification completed.    The patient is insured through [employer] Part D   The patient is currently admitted and ran test claims for the following: Eliquis '5mg'$ .  Copays and coinsurance results were relayed to Inpatient clinical team.

## 2021-12-28 NOTE — Assessment & Plan Note (Addendum)
Blood pressure currently within goal. -Continue home amlodipine -Keep holding home metoprolol, HCTZ and lisinopril-can restart if needed -Continue to monitor

## 2021-12-28 NOTE — Telephone Encounter (Signed)
Pharmacy Patient Advocate Encounter  Insurance verification completed.    The patient is insured through [employer] Part D   The patient is currently admitted and ran test claims for the following: Xarelto '20mg'$ .  Copays and coinsurance results were relayed to Inpatient clinical team.

## 2021-12-28 NOTE — Assessment & Plan Note (Addendum)
Most likely provoked with recent COVID infection and hospitalization. Initially treated with heparin infusion. Echocardiogram with no concern of right heart strain. Able to wean off from oxygen. -Switch heparin with Eliquis -Holding home aspirin to reduce the risk of bleeding -Patient will need 30-monthof anticoagulation -Follow-up with pulmonary as an outpatient

## 2021-12-28 NOTE — Hospital Course (Addendum)
Taken from prior notes.  71 yo female with pmh HTN, hypothyroid and recent covid infection (august 2023) who presented to ed after 3 days of sob with exertion, progressing to at rest. She states she has not had any new cough, fever, chills, no cp, dizziness, pre-syncopal symptoms. She endorses she has also had decreased appetite since Sunday but no n/v/d.  On arrival she was hemodynamically stable except tachycardia to 120s.  Saturating in mid 90s on room air. Found to have bilateral central PEs with considerable clot burden. Admitted with PCCM and started on heparin infusion. Echocardiogram with normal EF, grade 1 diastolic dysfunction and no comment on any right heart strain.  Pressure seems within normal limit. Lower extremity venous Doppler with left popliteal and peroneal vein DVT.  PCCM is suggesting switching to Eliquis for 18-monthfor concern of significant provoked PE, most likely secondary to recent hospitalization and COVID infection. They were also suggesting repeating echocardiogram in 664-monthMildly elevated troponin which peaked at 256 most likely secondary to demand ischemia.  9/27: Patient hemodynamically stable.  Mild tachycardia.  Still have some shortness of breath.  After multiple back-and-forth with insurance and family, we will proceed with Eliquis, patient had one-time payment of more than 400 with family agrees. Able to wean her off from oxygen. We will keep her for another night and possible discharge tomorrow.

## 2021-12-28 NOTE — Progress Notes (Signed)
ANTICOAGULATION CONSULT NOTE - Follow Up Consult  Pharmacy Consult for heparin Indication: acute pulmonary embolus and DVT  Allergies  Allergen Reactions   Doxycycline Nausea Only   Codeine Nausea And Vomiting   Pollen Extract Other (See Comments)    Unknown reaction     Patient Measurements: Height: '5\' 7"'$  (170.2 cm) Weight: 94.8 kg (209 lb) IBW/kg (Calculated) : 61.6 Heparin Dosing Weight:   Vital Signs: Temp: 99.3 F (37.4 C) (09/27 0507) Temp Source: Oral (09/27 0507) BP: 139/94 (09/27 0507) Pulse Rate: 100 (09/27 0507)  Labs: Recent Labs    12/26/21 1533 12/26/21 1600 12/26/21 1824 12/26/21 1848 12/27/21 0313 12/27/21 0539 12/27/21 1313 12/27/21 2122 12/28/21 0503  HGB 12.6  --   --   --  12.3  --   --   --  12.9  HCT 38.0  --   --   --  37.1  --   --   --  39.1  PLT 247  --   --   --  252  --   --   --  253  APTT  --   --   --  27  --   --   --   --   --   LABPROT  --   --   --  14.3  --   --   --   --   --   INR  --   --   --  1.1  --   --   --   --   --   HEPARINUNFRC  --    < > <0.10*  --  0.29*  --  0.39 0.32 0.38  CREATININE 1.17*  --   --   --  1.10*  --   --   --   --   TROPONINIHS  --    < > 256*  --  217* 189*  --   --   --    < > = values in this interval not displayed.     Estimated Creatinine Clearance: 53.9 mL/min (A) (by C-G formula based on SCr of 1.1 mg/dL (H)).   Assessment: Patient is a 71 y.o F with recent COVID-19 infection who presented to the ED on 12/26/21 with c/o SOB.  Chest Ct showed acute PE with evidence of RHS and LLE doppler came back positive for LLE DVT.  She's currently on heparin drip for VTE treatment.  Today, 12/28/2021: - heparin level remains therapeutic at 0.38 on IV heparin 1450 units/hr this - cbc stable - No bleeding or infusion related issues reported   Goal of Therapy:  Heparin level 0.3-0.7 units/ml Monitor platelets by anticoagulation protocol: Yes   Plan:  - continue IV heparin drip at 1450  units/hr - daily heparin level & CBC - monitor for s/sx bleeding  Peggyann Juba, PharmD, BCPS Pharmacy: [phone] 12/28/2021,7:54 AM

## 2021-12-28 NOTE — Assessment & Plan Note (Signed)
See above

## 2021-12-28 NOTE — TOC Benefit Eligibility Note (Signed)
Patient Teacher, English as a foreign language completed.    The current 30 day co-pay is, $482.00 with deductible and would be $47.00 after deductible is met.   The patient is insured through [employer] Part D.   Otis Brace, [place] Patient Advocate Specialist [place] Patient Advocate Team Direct Number: [phone]  Fax: [phone]

## 2021-12-28 NOTE — Progress Notes (Signed)
SATURATION QUALIFICATIONS: (This note is used to comply with regulatory documentation for home oxygen)  Patient Saturations on Room Air at Rest = 98%  Patient Saturations on Room Air while Ambulating = 93%  Patient Saturations on 0 Liters of oxygen while Ambulating = N/A  Please briefly explain why patient needs home oxygen: Patient did not desaturate while ambulating enough to require oxygen

## 2021-12-28 NOTE — Discharge Instructions (Signed)
Information on my medicine - ELIQUIS (apixaban)  This medication education was reviewed with me or my healthcare representative as part of my discharge preparation.  Why was Eliquis prescribed for you? Eliquis was prescribed to treat blood clots that may have been found in the veins of your legs (deep vein thrombosis) or in your lungs (pulmonary embolism) and to reduce the risk of them occurring again.  What do You need to know about Eliquis ? The starting dose is 10 mg (two 5 mg tablets) taken TWICE daily for the FIRST SEVEN (7) DAYS, then on  01/04/22  the dose is reduced to ONE 5 mg tablet taken TWICE daily.  Eliquis may be taken with or without food.   Try to take the dose about the same time in the morning and in the evening. If you have difficulty swallowing the tablet whole please discuss with your pharmacist how to take the medication safely.  Take Eliquis exactly as prescribed and DO NOT stop taking Eliquis without talking to the doctor who prescribed the medication.  Stopping may increase your risk of developing a new blood clot.  Refill your prescription before you run out.  After discharge, you should have regular check-up appointments with your healthcare provider that is prescribing your Eliquis.    What do you do if you miss a dose? If a dose of ELIQUIS is not taken at the scheduled time, take it as soon as possible on the same day and twice-daily administration should be resumed. The dose should not be doubled to make up for a missed dose.  Important Safety Information A possible side effect of Eliquis is bleeding. You should call your healthcare provider right away if you experience any of the following: Bleeding from an injury or your nose that does not stop. Unusual colored urine (red or dark brown) or unusual colored stools (red or black). Unusual bruising for unknown reasons. A serious fall or if you hit your head (even if there is no bleeding).  Some medicines  may interact with Eliquis and might increase your risk of bleeding or clotting while on Eliquis. To help avoid this, consult your healthcare provider or pharmacist prior to using any new prescription or non-prescription medications, including herbals, vitamins, non-steroidal anti-inflammatory drugs (NSAIDs) and supplements.  This website has more information on Eliquis (apixaban): http://www.eliquis.com/eliquis/home

## 2021-12-28 NOTE — TOC Benefit Eligibility Note (Signed)
Patient Teacher, English as a foreign language completed.    The current 30 day co-pay is,$482.00 with a deductible and will be $47.00 after deductible is met.    The patient is insured through [place], [place] Patient Advocate Specialist [place] Patient Advocate Team Direct Number: [phone]  Fax: [phone]

## 2021-12-28 NOTE — Assessment & Plan Note (Signed)
-   Continue home Synthroid °

## 2021-12-28 NOTE — Assessment & Plan Note (Signed)
Continue PPI ?

## 2021-12-29 ENCOUNTER — Other Ambulatory Visit (HOSPITAL_COMMUNITY): Payer: Self-pay

## 2021-12-29 DIAGNOSIS — I2609 Other pulmonary embolism with acute cor pulmonale: Secondary | ICD-10-CM

## 2021-12-29 DIAGNOSIS — I10 Essential (primary) hypertension: Secondary | ICD-10-CM | POA: Diagnosis not present

## 2021-12-29 DIAGNOSIS — K219 Gastro-esophageal reflux disease without esophagitis: Secondary | ICD-10-CM | POA: Diagnosis not present

## 2021-12-29 LAB — URINALYSIS, ROUTINE W REFLEX MICROSCOPIC
Bilirubin Urine: NEGATIVE
Glucose, UA: NEGATIVE mg/dL
Hgb urine dipstick: NEGATIVE
Ketones, ur: NEGATIVE mg/dL
Leukocytes,Ua: NEGATIVE
Nitrite: NEGATIVE
Protein, ur: NEGATIVE mg/dL
Specific Gravity, Urine: 1.023 (ref 1.005–1.030)
pH: 5 (ref 5.0–8.0)

## 2021-12-29 LAB — CBC
HCT: 38.6 % (ref 36.0–46.0)
Hemoglobin: 12.7 g/dL (ref 12.0–15.0)
MCH: 29.9 pg (ref 26.0–34.0)
MCHC: 32.9 g/dL (ref 30.0–36.0)
MCV: 90.8 fL (ref 80.0–100.0)
Platelets: 272 10*3/uL (ref 150–400)
RBC: 4.25 MIL/uL (ref 3.87–5.11)
RDW: 12.7 % (ref 11.5–15.5)
WBC: 9.2 10*3/uL (ref 4.0–10.5)
nRBC: 0 % (ref 0.0–0.2)

## 2021-12-29 MED ORDER — APIXABAN (ELIQUIS) VTE STARTER PACK (10MG AND 5MG)
ORAL_TABLET | ORAL | 0 refills | Status: DC
  Filled 2021-12-29: qty 74, 30d supply, fill #0

## 2021-12-29 MED ORDER — AMLODIPINE BESYLATE 5 MG PO TABS
5.0000 mg | ORAL_TABLET | Freq: Every day | ORAL | Status: DC
  Administered 2021-12-30 – 2021-12-31 (×2): 5 mg via ORAL
  Filled 2021-12-29 (×2): qty 1

## 2021-12-29 MED ORDER — APIXABAN 5 MG PO TABS: 5.0000 mg | ORAL_TABLET | Freq: Two times a day (BID) | ORAL | 3 refills | Status: DC

## 2021-12-29 MED ORDER — METOPROLOL TARTRATE 25 MG PO TABS
25.0000 mg | ORAL_TABLET | Freq: Two times a day (BID) | ORAL | Status: DC
  Administered 2021-12-29 – 2022-01-04 (×12): 25 mg via ORAL
  Filled 2021-12-29 (×12): qty 1

## 2021-12-29 NOTE — Progress Notes (Signed)
[facility] PROGRESS NOTE   Kari Coleman NLG:[id number] DOB: [birth year] DOA: 12/26/2021  PCP: Inda Coke, PA  Brief History/Interval Summary: 71 yo female with pmh HTN, hypothyroid and recent covid infection (august 2023) who presented to ed after 3 days of sob with exertion, progressing to at rest. She states she has not had any new cough, fever, chills, no cp, dizziness, pre-syncopal symptoms. She endorses she has also had decreased appetite since Sunday but no n/v/d. On arrival she was hemodynamically stable except tachycardia to 120s.  Saturating in mid 90s on room air. Found to have bilateral central PEs with considerable clot burden. Admitted with PCCM and started on heparin infusion. Echocardiogram with normal EF, grade 1 diastolic dysfunction and no comment on any right heart strain.  Pressure seems within normal limit. Lower extremity venous Doppler with left popliteal and peroneal vein DVT.   Consultants: PCCM   Subjective/Interval History: Patient denies any chest pain shortness of breath.  She does mention that she is feeling extremely weak.  Denies any dizziness lightheadedness.  No nausea or vomiting.    Assessment/Plan:  Acute pulmonary embolism Most likely provoked with recent COVID infection and hospitalization.  Was hospitalized at [facility] hospital for encephalopathy back in August. She was seen by pulmonology.  Treated with heparin infusion. Echocardiogram with no concern of right heart strain. Able to wean off from oxygen. Switched over to [employer].  Pulmonology to arrange outpatient follow-up.  She will need to stop taking aspirin.  She will need 6 months of anticoagulation.     Acute deep vein thrombosis (DVT) of left popliteal vein Will be anticoagulated as mentioned above.   Essential hypertension Blood pressure is reasonably well controlled.   We will continue to hold her HCTZ lisinopril.  Should be able to resume her metoprolol.   Some rebound tachycardia is noted.  We will cut back on the dose of amlodipine.   Hypothyroidism - Continue home Synthroid   Chronic angle-closure glaucoma of both eyes, severe stage - Continue home meds   GERD (gastroesophageal reflux disease) - Continue PPI   Obesity Estimated body mass index is 32.73 kg/m as calculated from the following:   Height as of this encounter: '5\' 7"'$  (1.702 m).   Weight as of this encounter: 94.8 kg.   DVT Prophylaxis: On Eliquis Code Status: Full code Family Communication: Discussed with the patient.  Tried calling daughter with no response. Disposition Plan: Initially plan was home.  However daughter mentioned that she would be unable to provide 24/7 or assistance which she needs.  TOC consulted for SNF.  Status is: Inpatient Remains inpatient appropriate because: Acute PE      Medications: Scheduled:  amLODipine  10 mg Oral Daily   apixaban  10 mg Oral BID   Followed by   Derrill Memo ON 01/04/2022] apixaban  5 mg Oral BID   fluticasone furoate-vilanterol  1 puff Inhalation Daily   latanoprost  1 drop Both Eyes QHS   levothyroxine  175 mcg Oral Q0600   pantoprazole  20 mg Oral Daily   pravastatin  10 mg Oral Daily   prednisoLONE acetate  1 drop Both Eyes Q2H while awake   Continuous: YCX:[id number], docusate sodium, iohexol, metoprolol tartrate, naphazoline-glycerin, mouth rinse, polyethylene glycol  Antibiotics: Anti-infectives (From admission, onward)    None       Objective:  Vital Signs  Vitals:   12/29/21 0642 12/29/21 0756 12/29/21 0924 12/29/21 1210  BP: 125/87  116/78 109/75  Pulse: (!) 101  (!) 108 93  Resp: 20  16   Temp: 100 F (37.8 C)  98.9 F (37.2 C) 98.6 F (37 C)  TempSrc: Oral  Oral Oral  SpO2: 92% (!) 88% 93% 94%  Weight:      Height:        Intake/Output Summary (Last 24 hours) at 12/29/2021 1417 Last data filed at 12/29/2021 1021 Gross per 24 hour  Intake 240 ml  Output 601 ml  Net -361 ml    Filed Weights   12/27/21 1200 12/28/21 0500  Weight: 93.1 kg 94.8 kg    General appearance: Awake alert.  In no distress Resp: Clear to auscultation bilaterally.  Normal effort Cardio: S1-S2 is normal regular.  No S3-S4.  No rubs murmurs or bruit GI: Abdomen is soft.  Nontender nondistended.  Bowel sounds are present normal.  No masses organomegaly   Lab Results:  Data Reviewed: I have personally reviewed following labs and reports of the imaging studies  CBC: Recent Labs  Lab 12/26/21 1533 12/27/21 0313 12/28/21 0503 12/29/21 0454  WBC 7.9 9.4 8.6 9.2  NEUTROABS 5.8  --   --   --   HGB 12.6 12.3 12.9 12.7  HCT 38.0 37.1 39.1 38.6  MCV 91.3 91.2 91.1 90.8  PLT 247 252 253 073    Basic Metabolic Panel: Recent Labs  Lab 12/26/21 1533 12/27/21 0313  NA 139 142  K 3.7 3.3*  CL 104 106  CO2 25 25  GLUCOSE 162* 134*  BUN 18 18  CREATININE 1.17* 1.10*  CALCIUM 9.8 10.0  MG 1.7  --     GFR: Estimated Creatinine Clearance: 53.9 mL/min (A) (by C-G formula based on SCr of 1.1 mg/dL (H)).  Liver Function Tests: Recent Labs  Lab 12/26/21 1533  AST 20  ALT 21  ALKPHOS 65  BILITOT 0.7  PROT 7.7  ALBUMIN 3.6     Coagulation Profile: Recent Labs  Lab 12/26/21 1848  INR 1.1     Recent Results (from the past 240 hour(s))  Resp Panel by RT-PCR (Flu A&B, Covid) Anterior Nasal Swab     Status: None   Collection Time: 12/26/21  3:33 PM   Specimen: Anterior Nasal Swab  Result Value Ref Range Status   SARS Coronavirus 2 by RT PCR NEGATIVE NEGATIVE Final    Comment: (NOTE) SARS-CoV-2 target nucleic acids are NOT DETECTED.  The SARS-CoV-2 RNA is generally detectable in upper respiratory specimens during the acute phase of infection. The lowest concentration of SARS-CoV-2 viral copies this assay can detect is 138 copies/mL. A negative result does not preclude SARS-Cov-2 infection and should not be used as the sole basis for treatment or other patient  management decisions. A negative result may occur with  improper specimen collection/handling, submission of specimen other than nasopharyngeal swab, presence of viral mutation(s) within the areas targeted by this assay, and inadequate number of viral copies(<138 copies/mL). A negative result must be combined with clinical observations, patient history, and epidemiological information. The expected result is Negative.  Fact Sheet for Patients:  EntrepreneurPulse.com.au  Fact Sheet for Healthcare Providers:  IncredibleEmployment.be  This test is no t yet approved or cleared by the [place] FDA and  has been authorized for detection and/or diagnosis of SARS-CoV-2 by FDA under an Emergency Use Authorization (EUA). This EUA will remain  in effect (meaning this test can be used) for the duration of the COVID-19 declaration under Section 564(b)(1) of the Act, 21  U.S.C.section 360bbb-3(b)(1), unless the authorization is terminated  or revoked sooner.       Influenza A by PCR NEGATIVE NEGATIVE Final   Influenza B by PCR NEGATIVE NEGATIVE Final    Comment: (NOTE) The Xpert Xpress SARS-CoV-2/FLU/RSV plus assay is intended as an aid in the diagnosis of influenza from Nasopharyngeal swab specimens and should not be used as a sole basis for treatment. Nasal washings and aspirates are unacceptable for Xpert Xpress SARS-CoV-2/FLU/RSV testing.  Fact Sheet for Patients: EntrepreneurPulse.com.au  Fact Sheet for Healthcare Providers: IncredibleEmployment.be  This test is not yet approved or cleared by the [place] FDA and has been authorized for detection and/or diagnosis of SARS-CoV-2 by FDA under an Emergency Use Authorization (EUA). This EUA will remain in effect (meaning this test can be used) for the duration of the COVID-19 declaration under Section 564(b)(1) of the Act, 21 U.S.C. section 360bbb-3(b)(1),  unless the authorization is terminated or revoked.  Performed at [facility], [place] [address], [place], [place] [address]   MRSA Next Gen by PCR, Nasal     Status: None   Collection Time: 12/26/21 10:41 PM   Specimen: Nasal Mucosa; Nasal Swab  Result Value Ref Range Status   MRSA by PCR Next Gen NOT DETECTED NOT DETECTED Final    Comment: (NOTE) The GeneXpert MRSA Assay (FDA approved for NASAL specimens only), is one component of a comprehensive MRSA colonization surveillance program. It is not intended to diagnose MRSA infection nor to guide or monitor treatment for MRSA infections. Test performance is not FDA approved in patients less than 34 years old. Performed at [facility], [place] [address] [place], [place] [address]       Radiology Studies: No results found.     LOS: 3 days   Everette [employer] on [email or web address]  12/29/2021, 2:17 PM

## 2021-12-29 NOTE — Care Management Important Message (Signed)
Important Message  Patient Details IM Letter given Name: Kari Coleman MRN: [id number] Date of Birth: 24-Apr-1950   Medicare Important Message Given:  Yes     Kerin Salen 12/29/2021, 1:58 PM

## 2021-12-29 NOTE — Progress Notes (Signed)
  Transition of Care [facility]) Screening Note   Patient Details  Name: Kari Coleman Date of Birth: Apr 29, 1950   Transition of Care [facility]) CM/SW Contact:    Dessa Phi, RN Phone Number: 12/29/2021, 12:51 PM    Transition of Care Department [facility]) has reviewed patient and no TOC needs have been identified at this time. We will continue to monitor patient advancement through interdisciplinary progression rounds. If new patient transition needs arise, please place a TOC consult.

## 2021-12-29 NOTE — Evaluation (Signed)
Physical Therapy Evaluation Patient Details Name: Kari Coleman MRN: [id number] DOB: Dec 26, 1950 Today's Date: 12/29/2021  History of Present Illness  Pt is a 71yo female presenting to ED on 9/25 via EMS reporting SOB found to have b/l PEs, started on heparin in ED. US revealed chronic LLE DVT.   PMH: hx of cervical cancer, GERD, HTN, hyperthyroidism, tobacco abuse, TB, R-TKA 2021, recent COVID 11/14/2021.   Clinical Impression  Pt presents with the problems listed above and functional impairments below. Received supine in bed agreeable to mobilize but with flat affect, reporting some abdominal discomfort but does not rank as pain. Pt has functional ROM and strength in all four extremities, palpable pulses in BLE. For supine to sit pt required supervision, use of bed rails, increased time. For transfers pt required min guard and min guard for short-distance ambulation as well, Vcs for proximity to device, pt seated in recliner at end of ambulation task. Pt reported need to void so assisted to transfer to [facility], pt incontinent of urine, required min guard for step pivot transfer; cleaned up pt. While attempting have BM on BSC to relieve abdominal discomfort, pt reported flushing, nausea, dizziness, and gaze became unfocused; pt verbal throughout though with decreased response time with all answers appropriate. Vitals at this time: BP 84/58, HR 93, SpO298% on RA. provided cool towels after a few minutes repeated BP in sitting 102/71, Spo2 100%, pt reporting reduction in all symptoms except abdominal discomfort. Returned to supine positioning in bed with step pivot, min guard. Pt would benefit from HHPT at discharge assuming that she would have 24/7 supervision and assistance, could not confirm this at evaluation; if she does not have that level of family help she would benefit from ST SNF-level therapies. We will continue to follow acutely.     Recommendations for follow up therapy are one component of a  multi-disciplinary discharge planning process, led by the attending physician.  Recommendations may be updated based on patient status, additional functional criteria and insurance authorization.  Follow Up Recommendations Home health PT (If pt has 24/7 assistance)      Assistance Recommended at Discharge Intermittent Supervision/Assistance  Patient can return home with the following  A little help with walking and/or transfers;A little help with bathing/dressing/bathroom;Assistance with cooking/housework;Help with stairs or ramp for entrance;Assist for transportation    Equipment Recommendations None recommended by PT  Recommendations for Other Services       Functional Status Assessment Patient has had a recent decline in their functional status and demonstrates the ability to make significant improvements in function in a reasonable and predictable amount of time.     Precautions / Restrictions Precautions Precautions: Fall Precaution Comments: Pt had recent fall where she tripped over her walker. Monitor O2 and BP Restrictions Weight Bearing Restrictions: No      Mobility  Bed Mobility Overal bed mobility: Needs Assistance Bed Mobility: Supine to Sit, Sit to Supine     Supine to sit: Supervision Sit to supine: Min guard   General bed mobility comments: For safety only, pt required increased time. Min guard for sit to supine for safety, no physical assist required    Transfers Overall transfer level: Needs assistance Equipment used: Rolling walker (2 wheels) Transfers: Sit to/from Stand Sit to Stand: Min guard           General transfer comment: For safety only, no physical assist required    Ambulation/Gait Ambulation/Gait assistance: Min guard Gait Distance (Feet): 15 Feet Assistive device:  Rolling walker (2 wheels) Gait Pattern/deviations: Step-to pattern, Decreased step length - right, Decreased step length - left, Decreased dorsiflexion - right,  Decreased dorsiflexion - left, Trunk flexed Gait velocity: decreased     General Gait Details: Pt ambulated with RW and min guard 15 ft, no physical assist required or overr LOB noted. After ambulation, pt seated in recliner, reporting she felt urge to void to transferred to [facility], then felt nauseated, flushed, with eyes unfocused, 84/58, HR 93, Spo298%. provided cool towels after a few minutes repeated BP 102/71, Spo2 100% on RA.  Stairs            Wheelchair Mobility    Modified Rankin (Stroke Patients Only)       Balance Overall balance assessment: Needs assistance Sitting-balance support: Feet supported, No upper extremity supported Sitting balance-Leahy Scale: Good     Standing balance support: Reliant on assistive device for balance, During functional activity, Bilateral upper extremity supported                                 Pertinent Vitals/Pain Pain Assessment Pain Assessment: No/denies pain    Home Living Family/patient expects to be discharged to:: Private residence Living Arrangements: Children Available Help at Discharge: Family;Available 24 hours/day Type of Home: Apartment Home Access: Level entry       Home Layout: One level Home Equipment: Conservation officer, nature (2 wheels);BSC/3in1;Cane - single point      Prior Function Prior Level of Function : Independent/Modified Independent;History of Falls (last six months)             Mobility Comments: RW home mobility, SPC for community distance. Fall: recent fall where she tripped over her walker ADLs Comments: Daughter: helps with bathing and dressing     Hand Dominance   Dominant Hand: Right    Extremity/Trunk Assessment   Upper Extremity Assessment Upper Extremity Assessment: Overall WFL for tasks assessed;RUE deficits/detail;LUE deficits/detail RUE Deficits / Details: functional ROM, functional strength, normal sensation RUE Sensation: WNL LUE Deficits / Details: functional ROM,  functional strength, normal sensation LUE Sensation: WNL    Lower Extremity Assessment Lower Extremity Assessment: RLE deficits/detail;LLE deficits/detail;Generalized weakness RLE Deficits / Details: MMT grossly 4/5, functional ROM at hip/knee/ankle, pulse intact, no edema RLE Sensation: WNL LLE Deficits / Details: MMT grossly 4/5, functional ROM at hip/knee/ankle, pulse intact, no edema LLE Sensation: WNL    Cervical / Trunk Assessment Cervical / Trunk Assessment: Kyphotic  Communication   Communication: No difficulties  Cognition Arousal/Alertness: Awake/alert Behavior During Therapy: WFL for tasks assessed/performed Overall Cognitive Status: Within Functional Limits for tasks assessed                                          General Comments      Exercises     Assessment/Plan    PT Assessment Patient needs continued PT services  PT Problem List Decreased strength;Decreased range of motion;Decreased activity tolerance;Decreased balance;Decreased mobility;Decreased coordination;Pain;Cardiopulmonary status limiting activity       PT Treatment Interventions DME instruction;Gait training;Stair training;Functional mobility training;Therapeutic activities;Therapeutic exercise;Balance training;Patient/family education;Neuromuscular re-education    PT Goals (Current goals can be found in the Care Plan section)  Acute Rehab PT Goals Patient Stated Goal: To reduce falls PT Goal Formulation: With patient Time For Goal Achievement: 01/12/22 Potential to Achieve Goals: Good  Frequency Min 3X/week     Co-evaluation               AM-PAC PT "6 Clicks" Mobility  Outcome Measure Help needed turning from your back to your side while in a flat bed without using bedrails?: None Help needed moving from lying on your back to sitting on the side of a flat bed without using bedrails?: A Little Help needed moving to and from a bed to a chair (including a  wheelchair)?: A Little Help needed standing up from a chair using your arms (e.g., wheelchair or bedside chair)?: A Little Help needed to walk in hospital room?: A Little Help needed climbing 3-5 steps with a railing? : A Little 6 Click Score: 19    End of Session Equipment Utilized During Treatment: Gait belt Activity Tolerance: Treatment limited secondary to medical complications (Comment) (Orthosasis) Patient left: in bed;with call bell/phone within reach;with nursing/sitter in room Nurse Communication: Mobility status PT Visit Diagnosis: History of falling (Z91.81);Muscle weakness (generalized) (M62.81);Other abnormalities of gait and mobility (R26.89)    Time: 6579-0383 PT Time Calculation (min) (ACUTE ONLY): 47 min   Charges:   PT Evaluation $PT Eval Low Complexity: 1 Low PT Treatments $Gait Training: 8-22 mins $Therapeutic Activity: 8-22 mins        Coolidge Breeze, PT, DPT [facility] Rehabilitation Department Office: [phone] Weekend pager: [phone]  Coolidge Breeze 12/29/2021, 1:48 PM

## 2021-12-30 DIAGNOSIS — I2609 Other pulmonary embolism with acute cor pulmonale: Secondary | ICD-10-CM | POA: Diagnosis not present

## 2021-12-30 DIAGNOSIS — I10 Essential (primary) hypertension: Secondary | ICD-10-CM | POA: Diagnosis not present

## 2021-12-30 DIAGNOSIS — K219 Gastro-esophageal reflux disease without esophagitis: Secondary | ICD-10-CM | POA: Diagnosis not present

## 2021-12-30 NOTE — TOC Progression Note (Signed)
Transition of Care [facility]) - Progression Note    Patient Details  Name: Kari Coleman MRN: [id number] Date of Birth: 11-26-1950  [facility]) CM/SW Contact  Reegan Mctighe, Juliann Pulse, RN Phone Number: 12/30/2021, 2:20 PM  Clinical Narrative: PT recc SNF;Family agree to SNF;Faxed out await bed offers,choice prior auth.      Expected Discharge Plan: [place] Barriers to Discharge: Continued Medical Work up  Expected Discharge Plan and Services Expected Discharge Plan: [place]   Discharge Planning Services: CM Consult Post Acute Care Choice: [place] arrangements for the past 2 months: Single Family Home Expected Discharge Date: 12/29/21                                     Social Determinants of Health (SDOH) Interventions    Readmission Risk Interventions     No data to display

## 2021-12-30 NOTE — Progress Notes (Signed)
[facility] PROGRESS NOTE   Kari Coleman ACZ:[id number] DOB: 07/05/1950 DOA: 12/26/2021  PCP: Inda Coke, PA  Brief History/Interval Summary: 71 yo female with pmh HTN, hypothyroid and recent covid infection (august 2023) who presented to ED after 3 days of sob with exertion, progressing to at rest. She states she has not had any new cough, fever, chills, no cp, dizziness, pre-syncopal symptoms. She endorses she has also had decreased appetite since Sunday but no n/v/d. On arrival she was hemodynamically stable except tachycardia to 120s.  Saturating in mid 90s on room air. Found to have bilateral central PEs with considerable clot burden. Admitted with PCCM and started on heparin infusion. Echocardiogram with normal EF, grade 1 diastolic dysfunction and no comment on any right heart strain.  Pressure seems within normal limit. Lower extremity venous Doppler with left popliteal and peroneal vein DVT.   Consultants: PCCM   Subjective/Interval History: Patient denies any chest pain, shortness of breath.  No nausea or vomiting.  Tolerating her diet.      Assessment/Plan:  Acute pulmonary embolism Most likely provoked with recent COVID infection and hospitalization.  Was hospitalized at [facility] hospital for encephalopathy back in August. She was seen by pulmonology.  Treated with heparin infusion. Echocardiogram with no concern of right heart strain. Able to wean off from oxygen. Switched over to [employer].  Pulmonology to arrange outpatient follow-up.  She will need to stop taking aspirin.  She will need 6 months of anticoagulation.     Acute deep vein thrombosis (DVT) of left popliteal vein Will be anticoagulated as mentioned above.   Essential hypertension Blood pressures were initially borderline low.  HCTZ lisinopril and metoprolol was discontinued.  She was continued only on amlodipine.  We will resume her beta-blocker and cut back on the dose of  amlodipine.  Continue to hold the lisinopril HCTZ.     Hypothyroidism - Continue home Synthroid   Chronic angle-closure glaucoma of both eyes, severe stage - Continue home meds   GERD (gastroesophageal reflux disease) - Continue PPI   Obesity Estimated body mass index is 34.16 kg/m as calculated from the following:   Height as of this encounter: '5\' 7"'$  (1.702 m).   Weight as of this encounter: 98.9 kg.   DVT Prophylaxis: On Eliquis Code Status: Full code Family Communication: Discussed with the patient.  Tried calling daughter with no response. Disposition Plan: Initially plan was home.  However daughter mentioned that she would be unable to provide 24/7 or assistance which she needs.  TOC consulted for SNF.  Patient is agreeable to short-term rehab.  Status is: Inpatient Remains inpatient appropriate because: Acute PE      Medications: Scheduled:  amLODipine  5 mg Oral Daily   apixaban  10 mg Oral BID   Followed by   Derrill Memo ON 01/04/2022] apixaban  5 mg Oral BID   fluticasone furoate-vilanterol  1 puff Inhalation Daily   latanoprost  1 drop Both Eyes QHS   levothyroxine  175 mcg Oral Q0600   metoprolol tartrate  25 mg Oral BID   pantoprazole  20 mg Oral Daily   pravastatin  10 mg Oral Daily   prednisoLONE acetate  1 drop Both Eyes Q2H while awake   Continuous: FUX:NATFTDDUK, docusate sodium, iohexol, metoprolol tartrate, naphazoline-glycerin, mouth rinse, polyethylene glycol  Antibiotics: Anti-infectives (From admission, onward)    None       Objective:  Vital Signs  Vitals:   12/30/21 0405 12/30/21 0500 12/30/21  0600 12/30/21 0811  BP: 119/84     Pulse: 79     Resp: 18 (!) 28 17   Temp: 98.7 F (37.1 C)     TempSrc: Oral     SpO2: 93%   93%  Weight:  98.9 kg    Height:        Intake/Output Summary (Last 24 hours) at 12/30/2021 1155 Last data filed at 12/30/2021 0900 Gross per 24 hour  Intake 610 ml  Output 300 ml  Net 310 ml    Filed  Weights   12/27/21 1200 12/28/21 0500 12/30/21 0500  Weight: 93.1 kg 94.8 kg 98.9 kg    General appearance: Awake alert.  In no distress Resp: Clear to auscultation bilaterally.  Normal effort Cardio: S1-S2 is normal regular.  No S3-S4.  No rubs murmurs or bruit GI: Abdomen is soft.  Nontender nondistended.  Bowel sounds are present normal.  No masses organomegaly Extremities: No edema.  Full range of motion of lower extremities. Neurologic: Alert and oriented x3.  No focal neurological deficits.     Lab Results:  Data Reviewed: I have personally reviewed following labs and reports of the imaging studies  CBC: Recent Labs  Lab 12/26/21 1533 12/27/21 0313 12/28/21 0503 12/29/21 0454  WBC 7.9 9.4 8.6 9.2  NEUTROABS 5.8  --   --   --   HGB 12.6 12.3 12.9 12.7  HCT 38.0 37.1 39.1 38.6  MCV 91.3 91.2 91.1 90.8  PLT 247 252 253 272     Basic Metabolic Panel: Recent Labs  Lab 12/26/21 1533 12/27/21 0313  NA 139 142  K 3.7 3.3*  CL 104 106  CO2 25 25  GLUCOSE 162* 134*  BUN 18 18  CREATININE 1.17* 1.10*  CALCIUM 9.8 10.0  MG 1.7  --      GFR: Estimated Creatinine Clearance: 55 mL/min (A) (by C-G formula based on SCr of 1.1 mg/dL (H)).  Liver Function Tests: Recent Labs  Lab 12/26/21 1533  AST 20  ALT 21  ALKPHOS 65  BILITOT 0.7  PROT 7.7  ALBUMIN 3.6      Coagulation Profile: Recent Labs  Lab 12/26/21 1848  INR 1.1      Recent Results (from the past 240 hour(s))  Resp Panel by RT-PCR (Flu A&B, Covid) Anterior Nasal Swab     Status: None   Collection Time: 12/26/21  3:33 PM   Specimen: Anterior Nasal Swab  Result Value Ref Range Status   SARS Coronavirus 2 by RT PCR NEGATIVE NEGATIVE Final    Comment: (NOTE) SARS-CoV-2 target nucleic acids are NOT DETECTED.  The SARS-CoV-2 RNA is generally detectable in upper respiratory specimens during the acute phase of infection. The lowest concentration of SARS-CoV-2 viral copies this assay can  detect is 138 copies/mL. A negative result does not preclude SARS-Cov-2 infection and should not be used as the sole basis for treatment or other patient management decisions. A negative result may occur with  improper specimen collection/handling, submission of specimen other than nasopharyngeal swab, presence of viral mutation(s) within the areas targeted by this assay, and inadequate number of viral copies(<138 copies/mL). A negative result must be combined with clinical observations, patient history, and epidemiological information. The expected result is Negative.  Fact Sheet for Patients:  EntrepreneurPulse.com.au  Fact Sheet for Healthcare Providers:  IncredibleEmployment.be  This test is no t yet approved or cleared by the [place] FDA and  has been authorized for detection and/or diagnosis of SARS-CoV-2  by FDA under an Emergency Use Authorization (EUA). This EUA will remain  in effect (meaning this test can be used) for the duration of the COVID-19 declaration under Section 564(b)(1) of the Act, 21 U.S.C.section 360bbb-3(b)(1), unless the authorization is terminated  or revoked sooner.       Influenza A by PCR NEGATIVE NEGATIVE Final   Influenza B by PCR NEGATIVE NEGATIVE Final    Comment: (NOTE) The Xpert Xpress SARS-CoV-2/FLU/RSV plus assay is intended as an aid in the diagnosis of influenza from Nasopharyngeal swab specimens and should not be used as a sole basis for treatment. Nasal washings and aspirates are unacceptable for Xpert Xpress SARS-CoV-2/FLU/RSV testing.  Fact Sheet for Patients: EntrepreneurPulse.com.au  Fact Sheet for Healthcare Providers: IncredibleEmployment.be  This test is not yet approved or cleared by the [place] FDA and has been authorized for detection and/or diagnosis of SARS-CoV-2 by FDA under an Emergency Use Authorization (EUA). This EUA will remain in  effect (meaning this test can be used) for the duration of the COVID-19 declaration under Section 564(b)(1) of the Act, 21 U.S.C. section 360bbb-3(b)(1), unless the authorization is terminated or revoked.  Performed at [facility], [place] [address], [place], [place] [address]   MRSA Next Gen by PCR, Nasal     Status: None   Collection Time: 12/26/21 10:41 PM   Specimen: Nasal Mucosa; Nasal Swab  Result Value Ref Range Status   MRSA by PCR Next Gen NOT DETECTED NOT DETECTED Final    Comment: (NOTE) The GeneXpert MRSA Assay (FDA approved for NASAL specimens only), is one component of a comprehensive MRSA colonization surveillance program. It is not intended to diagnose MRSA infection nor to guide or monitor treatment for MRSA infections. Test performance is not FDA approved in patients less than 64 years old. Performed at [facility], [place] [address] [place], [place] [address]       Radiology Studies: No results found.     LOS: 4 days   Ege Muckey [employer] on [email or web address]  12/30/2021, 11:55 AM

## 2021-12-30 NOTE — Progress Notes (Signed)
Physical Therapy Treatment Patient Details Name: Kari Coleman MRN: [id number] DOB: 1951-03-04 Today's Date: 12/30/2021   History of Present Illness Pt is a 71yo female presenting to ED on 9/25 via EMS reporting SOB found to have b/l PEs, started on heparin in ED. US revealed chronic LLE DVT.   PMH: hx of cervical cancer, GERD, HTN, hyperthyroidism, tobacco abuse, TB, R-TKA 2021, recent COVID 11/14/2021.    PT Comments    Pt received in bed, c/o fatigue and little energy. Pt agreed to skilled PT services. BP assess with position changes, Supine: 113/91, HR 93, Sitting: 104/71, HR 89,  Standing: 125/95,  HR 107. Pt without c/o dizziness. Short distance gait training in room with RW and CGA, no LOB. Overall good tolerance for activity.  Pt however is still significantly weak and would benefit from short term stay at SNF prior to returning home safely. Continue PT per POC.   Recommendations for follow up therapy are one component of a multi-disciplinary discharge planning process, led by the attending physician.  Recommendations may be updated based on patient status, additional functional criteria and insurance authorization.  Follow Up Recommendations  Other (comment)     Assistance Recommended at Discharge Intermittent Supervision/Assistance (vs SNF depending on how much supervision at home)  Patient can return home with the following A little help with walking and/or transfers;A little help with bathing/dressing/bathroom;Assistance with cooking/housework;Help with stairs or ramp for entrance;Assist for transportation   Equipment Recommendations  None recommended by PT    Recommendations for Other Services       Precautions / Restrictions Precautions Precautions: Fall Precaution Comments: Pt had recent fall where she tripped over her walker. Monitor O2 and BP Restrictions Weight Bearing Restrictions: No     Mobility  Bed Mobility Overal bed mobility: Needs Assistance Bed  Mobility: Supine to Sit     Supine to sit: Supervision     General bed mobility comments: For safety only, pt required increased time. Min guard for sit to supine for safety, no physical assist required    Transfers Overall transfer level: Needs assistance Equipment used: Rolling walker (2 wheels) Transfers: Sit to/from Stand Sit to Stand: Min guard           General transfer comment: For safety only, no physical assist required    Ambulation/Gait Ambulation/Gait assistance: Min guard Gait Distance (Feet): 15 Feet Assistive device: Rolling walker (2 wheels) Gait Pattern/deviations: Step-to pattern, Decreased step length - right, Decreased step length - left, Decreased dorsiflexion - right, Decreased dorsiflexion - left, Trunk flexed Gait velocity: decreased     General Gait Details: no physical assist required or LOB noted.   Stairs             Wheelchair Mobility    Modified Rankin (Stroke Patients Only)       Balance Overall balance assessment: Needs assistance Sitting-balance support: Feet supported, No upper extremity supported Sitting balance-Leahy Scale: Good     Standing balance support: Reliant on assistive device for balance, During functional activity, Bilateral upper extremity supported Standing balance-Leahy Scale: Fair                              Cognition Arousal/Alertness: Awake/alert Behavior During Therapy: WFL for tasks assessed/performed, Flat affect Overall Cognitive Status: Within Functional Limits for tasks assessed  Exercises General Exercises - Lower Extremity Ankle Circles/Pumps: AROM, Both, 10 reps Long Arc Quad: AROM, Both, 10 reps Hip Flexion/Marching: AROM, Both, 10 reps    General Comments General comments (skin integrity, edema, etc.): Pt BP  supine 113/91, HR 93,  sitting  104/71, HR 89,  Standing  125/95, HR 107  no symptoms of dizziness       Pertinent Vitals/Pain Pain Assessment Pain Assessment: No/denies pain    Home Living                          Prior Function            PT Goals (current goals can now be found in the care plan section) Acute Rehab PT Goals Patient Stated Goal: To get home again Progress towards PT goals: Progressing toward goals    Frequency    Min 3X/week      PT Plan Current plan remains appropriate    Co-evaluation              AM-PAC PT "6 Clicks" Mobility   Outcome Measure  Help needed turning from your back to your side while in a flat bed without using bedrails?: None Help needed moving from lying on your back to sitting on the side of a flat bed without using bedrails?: A Little Help needed moving to and from a bed to a chair (including a wheelchair)?: A Little Help needed standing up from a chair using your arms (e.g., wheelchair or bedside chair)?: A Little Help needed to walk in hospital room?: A Little Help needed climbing 3-5 steps with a railing? : A Little 6 Click Score: 19    End of Session Equipment Utilized During Treatment: Gait belt Activity Tolerance: Patient tolerated treatment well Patient left: in chair;with call bell/phone within reach;with chair alarm set Nurse Communication: Mobility status PT Visit Diagnosis: History of falling (Z91.81);Muscle weakness (generalized) (M62.81);Other abnormalities of gait and mobility (R26.89)     Time: 1438-8875 PT Time Calculation (min) (ACUTE ONLY): 44 min  Charges:  $Gait Training: 8-22 mins $Therapeutic Exercise: 8-22 mins $Therapeutic Activity: 8-22 mins                    Mikel Cella, PTA   Josie Dixon 12/30/2021, 1:23 PM

## 2021-12-30 NOTE — TOC Progression Note (Addendum)
Transition of Care [facility]) - Progression Note    Patient Details  Name: Kari Coleman MRN: [id number] Date of Birth: Apr 06, 1950  Transition of Care [facility]) CM/SW Contact  Jahnaya Branscome, Juliann Pulse, RN Phone Number: 12/30/2021, 12:21 PM  Clinical Narrative: Referral for 24/7 asst/HH vs STSNF-attempted to talk to dtrs Crystal-[place] to left vm mailbox full;spoke to Lauren(dtr) she is in a meeting & will call me back.Await call back to explain [place] services(intermittent)/private duty care(independent decision-can google resources/out of pocket cost). -12:44p-spoke to dtr Crystal-informed of above info-also that they cannot provide 24/7 asst-they live in the house but work & unable to help-she voiced understanding. [facility] rep Malachy Mood following for HHPT/OT(accepted)Will inform MD.    Expected Discharge Plan: [place] Barriers to Discharge: Continued Medical Work up  Expected Discharge Plan and Services Expected Discharge Plan: [place]   Discharge Planning Services: CM Consult Post Acute Care Choice: [place] arrangements for the past 2 months: Single Family Home Expected Discharge Date: 12/29/21                                     Social Determinants of Health (SDOH) Interventions    Readmission Risk Interventions     No data to display

## 2021-12-30 NOTE — NC FL2 (Signed)
[place] LEVEL OF CARE SCREENING TOOL     IDENTIFICATION  Patient Name: Kari Coleman Birthdate: Jul 05, 1950 Sex: female Admission Date (Current Location): 12/26/2021  [facility] and [place] Number:  Herbalist and Address:  [facility],  [place], [place]      Provider Number: [id number]  Attending Physician Name and Address:  Bonnielee Haff, MD  Relative Name and Phone Number:   [facility] Turner(dtr)[phone])    Current Level of Care: Hospital Recommended Level of Care: [place] Prior Approval Number:    Date Approved/Denied:   PASRR Number:  [id number])  Discharge Plan: SNF    Current Diagnoses: Patient Active Problem List   Diagnosis Date Noted   Chronic deep vein thrombosis (DVT) of left popliteal vein ([place]) 12/28/2021   Acute pulmonary embolism ([place]) 12/26/2021   Urinary incontinence 07/18/2021   Tuberculosis 07/18/2021   Tobacco abuse 07/18/2021   Retinal detachment 07/18/2021   PONV (postoperative nausea and vomiting) 07/18/2021   Palpitations 07/18/2021   Hyperthyroidism 07/18/2021   History of echocardiogram 07/18/2021   GERD (gastroesophageal reflux disease) 07/18/2021   Cervical cancer ([place]) 07/18/2021   Cataract 07/18/2021   Arthritis 07/18/2021   Allergy 07/18/2021   Chronic angle-closure glaucoma of both eyes, severe stage 03/02/2021   Vitamin D deficiency 01/22/2020   Multinodular goiter 01/22/2020   Fatigue 10/21/2019   Primary osteoarthritis of right knee 04/22/2019   Abnormal EKG 04/14/2019   Hypothyroidism 04/11/2019   Elevated glucose 04/08/2019   Chest discomfort 04/25/2018   Ex-smoker 04/25/2018   Primary localized osteoarthritis of right knee 01/04/2016   Glaucoma 2017   Hypertension     Orientation RESPIRATION BLADDER Height & Weight     Self, Time, Situation, Place  Normal Continent Weight: 98.9 kg Height:  '5\' 7"'$  (170.2 cm)  BEHAVIORAL SYMPTOMS/MOOD  NEUROLOGICAL BOWEL NUTRITION STATUS      Continent Diet (Regular)  AMBULATORY STATUS COMMUNICATION OF NEEDS Skin   Limited Assist Verbally Normal                       Personal Care Assistance Level of Assistance  Bathing, Feeding, Dressing Bathing Assistance: Limited assistance Feeding assistance: Limited assistance Dressing Assistance: Limited assistance     Functional Limitations Info  Sight, Hearing, Speech Sight Info: Impaired (readers) Hearing Info: Adequate Speech Info: Adequate    SPECIAL CARE FACTORS FREQUENCY  PT (By licensed PT), OT (By licensed OT)     PT Frequency:  (5x week) OT Frequency:  (5x week)            Contractures Contractures Info: Not present    Additional Factors Info  Code Status, Allergies Code Status Info:  (Full) Allergies Info:  (Doxycycline, Codeine, Pollen Extract)           Current Medications (12/30/2021):  This is the current hospital active medication list Current Facility-Administered Medications  Medication Dose Route Frequency Provider Last Rate Last Admin   albuterol (PROVENTIL) (2.5 MG/3ML) 0.083% nebulizer solution 2.5 mg  2.5 mg Nebulization Q6H PRN Lorella Nimrod, MD       amLODipine (NORVASC) tablet 5 mg  5 mg Oral Daily Bonnielee Haff, MD   5 mg at 12/30/21 1057   apixaban (ELIQUIS) tablet 10 mg  10 mg Oral BID Lorella Nimrod, MD   10 mg at 12/30/21 1057   Followed by   Derrill Memo ON 01/04/2022] apixaban (ELIQUIS) tablet 5 mg  5 mg  Oral BID Lorella Nimrod, MD       docusate sodium (COLACE) capsule 100 mg  100 mg Oral BID PRN Simonne Maffucci B, MD       fluticasone furoate-vilanterol (BREO ELLIPTA) 100-25 MCG/ACT 1 puff  1 puff Inhalation Daily Simonne Maffucci B, MD   1 puff at 12/30/21 0810   iohexol (OMNIPAQUE) 350 MG/ML injection 75 mL  75 mL Intravenous Once PRN Simonne Maffucci B, MD       latanoprost (XALATAN) 0.005 % ophthalmic solution 1 drop  1 drop Both Eyes QHS Lorella Nimrod, MD   1 drop at 12/29/21 2136    levothyroxine (SYNTHROID) tablet 175 mcg  175 mcg Oral Q0600 Simonne Maffucci B, MD   175 mcg at 12/30/21 0530   metoprolol tartrate (LOPRESSOR) injection 5 mg  5 mg Intravenous Q8H PRN Simonne Maffucci B, MD       metoprolol tartrate (LOPRESSOR) tablet 25 mg  25 mg Oral BID Bonnielee Haff, MD   25 mg at 12/30/21 1057   naphazoline-glycerin (CLEAR EYES REDNESS) ophth solution 1-2 drop  1-2 drop Both Eyes QID PRN [place], Hewitt Shorts, MD   1 drop at 12/28/21 1705   Oral care mouth rinse  15 mL Mouth Rinse PRN Simonne Maffucci B, MD       pantoprazole (PROTONIX) EC tablet 20 mg  20 mg Oral Daily Lorella Nimrod, MD   20 mg at 12/30/21 1057   polyethylene glycol (MIRALAX / GLYCOLAX) packet 17 g  17 g Oral Daily PRN Simonne Maffucci B, MD       pravastatin (PRAVACHOL) tablet 10 mg  10 mg Oral Daily Lorella Nimrod, MD   10 mg at 12/30/21 1056   prednisoLONE acetate (PRED FORTE) 1 % ophthalmic suspension 1 drop  1 drop Both Eyes Q2H while awake Lorella Nimrod, MD   1 drop at 12/30/21 1330     Discharge Medications: Please see discharge summary for a list of discharge medications.  Relevant Imaging Results:  Relevant Lab Results:   Additional Information  [phone])  Treg Diemer, Juliann Pulse, RN

## 2021-12-31 ENCOUNTER — Other Ambulatory Visit (HOSPITAL_COMMUNITY): Payer: Self-pay

## 2021-12-31 DIAGNOSIS — K219 Gastro-esophageal reflux disease without esophagitis: Secondary | ICD-10-CM | POA: Diagnosis not present

## 2021-12-31 DIAGNOSIS — I2609 Other pulmonary embolism with acute cor pulmonale: Secondary | ICD-10-CM | POA: Diagnosis not present

## 2021-12-31 DIAGNOSIS — I10 Essential (primary) hypertension: Secondary | ICD-10-CM | POA: Diagnosis not present

## 2021-12-31 LAB — BASIC METABOLIC PANEL
Anion gap: 8 (ref 5–15)
BUN: 27 mg/dL — ABNORMAL HIGH (ref 8–23)
CO2: 23 mmol/L (ref 22–32)
Calcium: 9.1 mg/dL (ref 8.9–10.3)
Chloride: 107 mmol/L (ref 98–111)
Creatinine, Ser: 1.35 mg/dL — ABNORMAL HIGH (ref 0.44–1.00)
GFR, Estimated: 41 mL/min — ABNORMAL LOW (ref >60)
Glucose, Bld: 135 mg/dL — ABNORMAL HIGH (ref 70–99)
Potassium: 3.6 mmol/L (ref 3.5–5.1)
Sodium: 138 mmol/L (ref 135–145)

## 2021-12-31 LAB — CBC
HCT: 34.5 % — ABNORMAL LOW (ref 36.0–46.0)
Hemoglobin: 11.2 g/dL — ABNORMAL LOW (ref 12.0–15.0)
MCH: 29.9 pg (ref 26.0–34.0)
MCHC: 32.5 g/dL (ref 30.0–36.0)
MCV: 92 fL (ref 80.0–100.0)
Platelets: 270 10*3/uL (ref 150–400)
RBC: 3.75 MIL/uL — ABNORMAL LOW (ref 3.87–5.11)
RDW: 13.1 % (ref 11.5–15.5)
WBC: 9.4 10*3/uL (ref 4.0–10.5)
nRBC: 0 % (ref 0.0–0.2)

## 2021-12-31 MED ORDER — PANTOPRAZOLE SODIUM 40 MG PO TBEC
40.0000 mg | DELAYED_RELEASE_TABLET | Freq: Two times a day (BID) | ORAL | Status: DC
  Administered 2021-12-31 – 2022-01-04 (×8): 40 mg via ORAL
  Filled 2021-12-31 (×8): qty 1

## 2021-12-31 NOTE — Progress Notes (Signed)
Mobility Specialist - Progress Note   12/31/21 1100  Mobility  Activity Ambulated with assistance in hallway  Range of Motion/Exercises Active  Level of Assistance Contact guard assist, steadying assist  Assistive Device Front wheel walker  Distance Ambulated (ft) 70 ft  Activity Response Tolerated well  $Mobility charge 1 Mobility   Pt was found in bed and agreeable to ambulate. Pt needed cues on using the RW and at EOS returned to EOB and was left with NT in room.  Ferd Hibbs Mobility Specialist

## 2021-12-31 NOTE — Progress Notes (Addendum)
[facility] PROGRESS NOTE   Kari Coleman [id number] DOB: [birth year] DOA: 12/26/2021  PCP: Inda Coke, PA  Brief History/Interval Summary: 71 yo female with pmh HTN, hypothyroid and recent covid infection (august 2023) who presented to ED after 3 days of sob with exertion, progressing to at rest. She states she has not had any new cough, fever, chills, no cp, dizziness, pre-syncopal symptoms. She endorses she has also had decreased appetite since Sunday but no n/v/d. On arrival she was hemodynamically stable except tachycardia to 120s.  Saturating in mid 90s on room air. Found to have bilateral central PEs with considerable clot burden. Admitted with PCCM and started on heparin infusion. Echocardiogram with normal EF, grade 1 diastolic dysfunction and no comment on any right heart strain.  Pressure seems within normal limit. Lower extremity venous Doppler with left popliteal and peroneal vein DVT.   Consultants: PCCM   Subjective/Interval History: Patient denies any shortness of breath or chest pain this morning.  Occasional nausea is present though she has not had any vomiting.  Tolerating her diet.    Assessment/Plan:  Acute pulmonary embolism Most likely provoked with recent COVID infection and hospitalization.  Was hospitalized at [facility] hospital for encephalopathy back in August. She was seen by pulmonology.  Treated with heparin infusion. Echocardiogram with no concern of right heart strain. Able to wean off from oxygen. Switched over to [employer].  Pulmonology to arrange outpatient follow-up.  She will need to stop taking aspirin.  She will need 6 months of anticoagulation.  She is tolerating her anticoagulation well.  No evidence for bleeding.  Mild drop in hemoglobin is noted though it is probably dilutional.   Acute deep vein thrombosis (DVT) of left popliteal vein Will be anticoagulated as mentioned above.   Essential hypertension Blood  pressures were initially borderline low.  HCTZ lisinopril and metoprolol was discontinued.  She was continued only on amlodipine.   Beta-blocker was resumed and dose of amlodipine was decreased.  Blood pressure is reasonably well controlled.   Continue to hold the lisinopril HCTZ.    Chronic kidney disease stage IIIa vs IIIb Renal function close to baseline.   Hypothyroidism Continue home Synthroid   Chronic angle-closure glaucoma of both eyes, severe stage Continue home meds   GERD (gastroesophageal reflux disease) Nausea could be due to GERD.  Will increase PPI to twice a day.   Obesity Estimated body mass index is 32.34 kg/m as calculated from the following:   Height as of this encounter: '5\' 7"'$  (1.702 m).   Weight as of this encounter: 93.7 kg.   DVT Prophylaxis: On Eliquis Code Status: Full code Family Communication: Discussed with the patient.  Tried calling daughter with no response. Disposition Plan: Waiting on skilled nursing facility for short-term rehab  Status is: Inpatient Remains inpatient appropriate because: Acute PE      Medications: Scheduled:  amLODipine  5 mg Oral Daily   apixaban  10 mg Oral BID   Followed by   Derrill Memo ON 01/04/2022] apixaban  5 mg Oral BID   fluticasone furoate-vilanterol  1 puff Inhalation Daily   latanoprost  1 drop Both Eyes QHS   levothyroxine  175 mcg Oral Q0600   metoprolol tartrate  25 mg Oral BID   pantoprazole  40 mg Oral BID AC   pravastatin  10 mg Oral Daily   prednisoLONE acetate  1 drop Both Eyes Q2H while awake   Continuous: KDX:IPJASNKNL, docusate sodium, iohexol, metoprolol tartrate, naphazoline-glycerin,  mouth rinse, polyethylene glycol  Antibiotics: Anti-infectives (From admission, onward)    None       Objective:  Vital Signs  Vitals:   12/30/21 2052 12/31/21 0451 12/31/21 0500 12/31/21 0746  BP: 120/70 112/71    Pulse: 89 88    Resp: 17 17    Temp: 98.9 F (37.2 C) 99.1 F (37.3 C)     TempSrc: Oral Oral    SpO2: 98% 92%  93%  Weight:   93.7 kg   Height:        Intake/Output Summary (Last 24 hours) at 12/31/2021 1154 Last data filed at 12/31/2021 1120 Gross per 24 hour  Intake 1200 ml  Output 550 ml  Net 650 ml    Filed Weights   12/28/21 0500 12/30/21 0500 12/31/21 0500  Weight: 94.8 kg 98.9 kg 93.7 kg    General appearance: Awake alert.  In no distress Resp: Clear to auscultation bilaterally.  Normal effort Cardio: S1-S2 is normal regular.  No S3-S4.  No rubs murmurs or bruit GI: Abdomen is soft.  Nontender nondistended.  Bowel sounds are present normal.  No masses organomegaly Extremities: No edema.  Full range of motion of lower extremities.     Lab Results:  Data Reviewed: I have personally reviewed following labs and reports of the imaging studies  CBC: Recent Labs  Lab 12/26/21 1533 12/27/21 0313 12/28/21 0503 12/29/21 0454 12/31/21 0556  WBC 7.9 9.4 8.6 9.2 9.4  NEUTROABS 5.8  --   --   --   --   HGB 12.6 12.3 12.9 12.7 11.2*  HCT 38.0 37.1 39.1 38.6 34.5*  MCV 91.3 91.2 91.1 90.8 92.0  PLT 247 252 253 272 270     Basic Metabolic Panel: Recent Labs  Lab 12/26/21 1533 12/27/21 0313 12/31/21 0556  NA 139 142 138  K 3.7 3.3* 3.6  CL 104 106 107  CO2 '25 25 23  '$ GLUCOSE 162* 134* 135*  BUN 18 18 27*  CREATININE 1.17* 1.10* 1.35*  CALCIUM 9.8 10.0 9.1  MG 1.7  --   --      GFR: Estimated Creatinine Clearance: 43.6 mL/min (A) (by C-G formula based on SCr of 1.35 mg/dL (H)).  Liver Function Tests: Recent Labs  Lab 12/26/21 1533  AST 20  ALT 21  ALKPHOS 65  BILITOT 0.7  PROT 7.7  ALBUMIN 3.6      Coagulation Profile: Recent Labs  Lab 12/26/21 1848  INR 1.1      Recent Results (from the past 240 hour(s))  Resp Panel by RT-PCR (Flu A&B, Covid) Anterior Nasal Swab     Status: None   Collection Time: 12/26/21  3:33 PM   Specimen: Anterior Nasal Swab  Result Value Ref Range Status   SARS Coronavirus 2 by RT  PCR NEGATIVE NEGATIVE Final    Comment: (NOTE) SARS-CoV-2 target nucleic acids are NOT DETECTED.  The SARS-CoV-2 RNA is generally detectable in upper respiratory specimens during the acute phase of infection. The lowest concentration of SARS-CoV-2 viral copies this assay can detect is 138 copies/mL. A negative result does not preclude SARS-Cov-2 infection and should not be used as the sole basis for treatment or other patient management decisions. A negative result may occur with  improper specimen collection/handling, submission of specimen other than nasopharyngeal swab, presence of viral mutation(s) within the areas targeted by this assay, and inadequate number of viral copies(<138 copies/mL). A negative result must be combined with clinical observations, patient history, and  epidemiological information. The expected result is Negative.  Fact Sheet for Patients:  EntrepreneurPulse.com.au  Fact Sheet for Healthcare Providers:  IncredibleEmployment.be  This test is no t yet approved or cleared by the [place] FDA and  has been authorized for detection and/or diagnosis of SARS-CoV-2 by FDA under an Emergency Use Authorization (EUA). This EUA will remain  in effect (meaning this test can be used) for the duration of the COVID-19 declaration under Section 564(b)(1) of the Act, 21 U.S.C.section 360bbb-3(b)(1), unless the authorization is terminated  or revoked sooner.       Influenza A by PCR NEGATIVE NEGATIVE Final   Influenza B by PCR NEGATIVE NEGATIVE Final    Comment: (NOTE) The Xpert Xpress SARS-CoV-2/FLU/RSV plus assay is intended as an aid in the diagnosis of influenza from Nasopharyngeal swab specimens and should not be used as a sole basis for treatment. Nasal washings and aspirates are unacceptable for Xpert Xpress SARS-CoV-2/FLU/RSV testing.  Fact Sheet for Patients: EntrepreneurPulse.com.au  Fact Sheet for  Healthcare Providers: IncredibleEmployment.be  This test is not yet approved or cleared by the [place] FDA and has been authorized for detection and/or diagnosis of SARS-CoV-2 by FDA under an Emergency Use Authorization (EUA). This EUA will remain in effect (meaning this test can be used) for the duration of the COVID-19 declaration under Section 564(b)(1) of the Act, 21 U.S.C. section 360bbb-3(b)(1), unless the authorization is terminated or revoked.  Performed at [facility], [place] [address], [place], [place] [address]   MRSA Next Gen by PCR, Nasal     Status: None   Collection Time: 12/26/21 10:41 PM   Specimen: Nasal Mucosa; Nasal Swab  Result Value Ref Range Status   MRSA by PCR Next Gen NOT DETECTED NOT DETECTED Final    Comment: (NOTE) The GeneXpert MRSA Assay (FDA approved for NASAL specimens only), is one component of a comprehensive MRSA colonization surveillance program. It is not intended to diagnose MRSA infection nor to guide or monitor treatment for MRSA infections. Test performance is not FDA approved in patients less than 41 years old. Performed at [facility], [place] [address], [place], [place] [address]       Radiology Studies: No results found.     LOS: 5 days   Kari Coleman [employer] on [email or web address]  12/31/2021, 11:54 AM

## 2022-01-01 DIAGNOSIS — I10 Essential (primary) hypertension: Secondary | ICD-10-CM | POA: Diagnosis not present

## 2022-01-01 DIAGNOSIS — I2699 Other pulmonary embolism without acute cor pulmonale: Secondary | ICD-10-CM | POA: Diagnosis not present

## 2022-01-01 DIAGNOSIS — K5900 Constipation, unspecified: Secondary | ICD-10-CM

## 2022-01-01 MED ORDER — SENNOSIDES-DOCUSATE SODIUM 8.6-50 MG PO TABS
2.0000 | ORAL_TABLET | Freq: Two times a day (BID) | ORAL | Status: DC
  Administered 2022-01-01 – 2022-01-03 (×4): 2 via ORAL
  Filled 2022-01-01 (×7): qty 2

## 2022-01-01 MED ORDER — POLYETHYLENE GLYCOL 3350 17 G PO PACK
17.0000 g | PACK | Freq: Every day | ORAL | Status: DC
  Administered 2022-01-01: 17 g via ORAL
  Filled 2022-01-01 (×3): qty 1

## 2022-01-01 NOTE — TOC Progression Note (Signed)
Transition of Care [facility]) - Progression Note    Patient Details  Name: Kari Coleman MRN: [id number] Date of Birth: 04/12/1950  [facility]) CM/SW Contact  Ross Ludwig, [place] Phone Number: 01/01/2022, 3:33 PM  Clinical Narrative:     CSW spoke to patient's daughter Crystal to discuss bed offers.  They would prefer [facility] or [facility].  CSW informed daughter that both facilities are still pending.  CSW sent message to both facilities to ask them to review patient's information and see if they can provide a bed offer.  CSW awaiting call back from SNFs.     Expected Discharge Plan: [place] Barriers to Discharge: Continued Medical Work up  Expected Discharge Plan and Services Expected Discharge Plan: [place]   Discharge Planning Services: CM Consult Post Acute Care Choice: [place] arrangements for the past 2 months: Single Family Home Expected Discharge Date: 12/29/21                                     Social Determinants of Health (SDOH) Interventions    Readmission Risk Interventions     No data to display

## 2022-01-01 NOTE — Progress Notes (Signed)
Mobility Specialist - Progress Note   01/01/22 1130  Mobility  Activity Ambulated with assistance in hallway  Level of Assistance Standby assist, set-up cues, supervision of patient - no hands on  Assistive Device Front wheel walker  Distance Ambulated (ft) 130 ft  Activity Response Tolerated well  $Mobility charge 1 Mobility   Pt received in bed and agreed for mobility, some weakness in arms nearing EOS from leaning on walker, no pain otherwise. Pt back to chair with all needs met.   Roderick Pee Mobility Specialist

## 2022-01-01 NOTE — Progress Notes (Signed)
[facility] PROGRESS NOTE   Kari Coleman WYO:[id number] DOB: 1951/02/08 DOA: 12/26/2021  PCP: Inda Coke, PA  Brief History/Interval Summary: 71 yo female with pmh HTN, hypothyroid and recent covid infection (august 2023) who presented to ED after 3 days of sob with exertion, progressing to at rest. She states she has not had any new cough, fever, chills, no cp, dizziness, pre-syncopal symptoms. She endorses she has also had decreased appetite since Sunday but no n/v/d. On arrival she was hemodynamically stable except tachycardia to 120s.  Saturating in mid 90s on room air. Found to have bilateral central PEs with considerable clot burden. Admitted with PCCM and started on heparin infusion. Echocardiogram with normal EF, grade 1 diastolic dysfunction and no comment on any right heart strain.  Pressure seems within normal limit. Lower extremity venous Doppler with left popliteal and peroneal vein DVT.   Consultants: PCCM   Subjective/Interval History: Patient feels well.   No nausea vomiting.  Denies any shortness of breath.  Does complain of constipation though.    Assessment/Plan:  Acute pulmonary embolism Most likely provoked by recent COVID infection and hospitalization.  Was hospitalized at [facility] hospital for encephalopathy back in August. She was seen by pulmonology.  Treated with heparin infusion. Echocardiogram with no concern of right heart strain. Able to wean off from oxygen. Switched over to [employer].  Pulmonology to arrange outpatient follow-up.  She will need to stop taking aspirin.  She will need 6 months of anticoagulation.  She is tolerating her anticoagulation well.  No evidence for bleeding.  Mild drop in hemoglobin is noted though it is probably dilutional.   Acute deep vein thrombosis (DVT) of left popliteal vein Anticoagulated as mentioned above.   Essential hypertension Blood pressures were initially borderline low.  HCTZ  lisinopril and metoprolol was discontinued.  She was continued only on amlodipine.   Beta-blocker was resumed and dose of amlodipine was decreased.  Blood pressure remains borderline low.  Will discontinue amlodipine altogether.  Heart rate has improved.    Chronic kidney disease stage IIIa vs IIIb Renal function close to baseline.   Hypothyroidism Continue home Synthroid  Constipation We will initiate scheduled MiraLAX.   Chronic angle-closure glaucoma of both eyes, severe stage Continue home meds   GERD (gastroesophageal reflux disease) Nausea could be due to GERD.  Will increase PPI to twice a day.   Obesity Estimated body mass index is 32.34 kg/m as calculated from the following:   Height as of this encounter: '5\' 7"'$  (1.702 m).   Weight as of this encounter: 93.7 kg.   DVT Prophylaxis: On Eliquis Code Status: Full code Family Communication: Discussed with the patient.  Disposition Plan: Waiting on skilled nursing facility for short-term rehab  Status is: Inpatient Remains inpatient appropriate because: Acute PE      Medications: Scheduled:  apixaban  10 mg Oral BID   Followed by   Derrill Memo ON 01/04/2022] apixaban  5 mg Oral BID   fluticasone furoate-vilanterol  1 puff Inhalation Daily   latanoprost  1 drop Both Eyes QHS   levothyroxine  175 mcg Oral Q0600   metoprolol tartrate  25 mg Oral BID   pantoprazole  40 mg Oral BID AC   polyethylene glycol  17 g Oral Daily   pravastatin  10 mg Oral Daily   prednisoLONE acetate  1 drop Both Eyes Q2H while awake   senna-docusate  2 tablet Oral BID   Continuous: DXA:JOINOMVEH, docusate sodium, iohexol, metoprolol  tartrate, naphazoline-glycerin, mouth rinse  Antibiotics: Anti-infectives (From admission, onward)    None       Objective:  Vital Signs  Vitals:   12/31/21 1347 12/31/21 2013 01/01/22 0525 01/01/22 0809  BP: 113/66 122/78 97/79   Pulse: 76 77 74   Resp: '16 18 18   '$ Temp: 97.9 F (36.6 C) 98.2 F  (36.8 C) 99.3 F (37.4 C)   TempSrc: Oral Oral Oral   SpO2: 94% 95% 93% 94%  Weight:      Height:        Intake/Output Summary (Last 24 hours) at 01/01/2022 1128 Last data filed at 01/01/2022 0540 Gross per 24 hour  Intake 600 ml  Output 1000 ml  Net -400 ml    Filed Weights   12/28/21 0500 12/30/21 0500 12/31/21 0500  Weight: 94.8 kg 98.9 kg 93.7 kg    General appearance: Awake alert.  In no distress Resp: Clear to auscultation bilaterally.  Normal effort Cardio: S1-S2 is normal regular.  No S3-S4.  No rubs murmurs or bruit GI: Abdomen is soft.  Nontender nondistended.  Bowel sounds are present normal.  No masses organomegaly     Lab Results:  Data Reviewed: I have personally reviewed following labs and reports of the imaging studies  CBC: Recent Labs  Lab 12/26/21 1533 12/27/21 0313 12/28/21 0503 12/29/21 0454 12/31/21 0556  WBC 7.9 9.4 8.6 9.2 9.4  NEUTROABS 5.8  --   --   --   --   HGB 12.6 12.3 12.9 12.7 11.2*  HCT 38.0 37.1 39.1 38.6 34.5*  MCV 91.3 91.2 91.1 90.8 92.0  PLT 247 252 253 272 270     Basic Metabolic Panel: Recent Labs  Lab 12/26/21 1533 12/27/21 0313 12/31/21 0556  NA 139 142 138  K 3.7 3.3* 3.6  CL 104 106 107  CO2 '25 25 23  '$ GLUCOSE 162* 134* 135*  BUN 18 18 27*  CREATININE 1.17* 1.10* 1.35*  CALCIUM 9.8 10.0 9.1  MG 1.7  --   --      GFR: Estimated Creatinine Clearance: 43.6 mL/min (A) (by C-G formula based on SCr of 1.35 mg/dL (H)).  Liver Function Tests: Recent Labs  Lab 12/26/21 1533  AST 20  ALT 21  ALKPHOS 65  BILITOT 0.7  PROT 7.7  ALBUMIN 3.6      Coagulation Profile: Recent Labs  Lab 12/26/21 1848  INR 1.1      Recent Results (from the past 240 hour(s))  Resp Panel by RT-PCR (Flu A&B, Covid) Anterior Nasal Swab     Status: None   Collection Time: 12/26/21  3:33 PM   Specimen: Anterior Nasal Swab  Result Value Ref Range Status   SARS Coronavirus 2 by RT PCR NEGATIVE NEGATIVE Final     Comment: (NOTE) SARS-CoV-2 target nucleic acids are NOT DETECTED.  The SARS-CoV-2 RNA is generally detectable in upper respiratory specimens during the acute phase of infection. The lowest concentration of SARS-CoV-2 viral copies this assay can detect is 138 copies/mL. A negative result does not preclude SARS-Cov-2 infection and should not be used as the sole basis for treatment or other patient management decisions. A negative result may occur with  improper specimen collection/handling, submission of specimen other than nasopharyngeal swab, presence of viral mutation(s) within the areas targeted by this assay, and inadequate number of viral copies(<138 copies/mL). A negative result must be combined with clinical observations, patient history, and epidemiological information. The expected result is Negative.  Fact  Sheet for Patients:  EntrepreneurPulse.com.au  Fact Sheet for Healthcare Providers:  IncredibleEmployment.be  This test is no t yet approved or cleared by the [place] FDA and  has been authorized for detection and/or diagnosis of SARS-CoV-2 by FDA under an Emergency Use Authorization (EUA). This EUA will remain  in effect (meaning this test can be used) for the duration of the COVID-19 declaration under Section 564(b)(1) of the Act, 21 U.S.C.section 360bbb-3(b)(1), unless the authorization is terminated  or revoked sooner.       Influenza A by PCR NEGATIVE NEGATIVE Final   Influenza B by PCR NEGATIVE NEGATIVE Final    Comment: (NOTE) The Xpert Xpress SARS-CoV-2/FLU/RSV plus assay is intended as an aid in the diagnosis of influenza from Nasopharyngeal swab specimens and should not be used as a sole basis for treatment. Nasal washings and aspirates are unacceptable for Xpert Xpress SARS-CoV-2/FLU/RSV testing.  Fact Sheet for Patients: EntrepreneurPulse.com.au  Fact Sheet for Healthcare  Providers: IncredibleEmployment.be  This test is not yet approved or cleared by the [place] FDA and has been authorized for detection and/or diagnosis of SARS-CoV-2 by FDA under an Emergency Use Authorization (EUA). This EUA will remain in effect (meaning this test can be used) for the duration of the COVID-19 declaration under Section 564(b)(1) of the Act, 21 U.S.C. section 360bbb-3(b)(1), unless the authorization is terminated or revoked.  Performed at [facility], [place] [address] [place], [place] [address]   MRSA Next Gen by PCR, Nasal     Status: None   Collection Time: 12/26/21 10:41 PM   Specimen: Nasal Mucosa; Nasal Swab  Result Value Ref Range Status   MRSA by PCR Next Gen NOT DETECTED NOT DETECTED Final    Comment: (NOTE) The GeneXpert MRSA Assay (FDA approved for NASAL specimens only), is one component of a comprehensive MRSA colonization surveillance program. It is not intended to diagnose MRSA infection nor to guide or monitor treatment for MRSA infections. Test performance is not FDA approved in patients less than 10 years old. Performed at [facility], [place] [address], [place], [place] [address]       Radiology Studies: No results found.     LOS: 6 days   Synethia Endicott [employer] on [email or web address]  01/01/2022, 11:28 AM

## 2022-01-02 ENCOUNTER — Other Ambulatory Visit (HOSPITAL_COMMUNITY): Payer: Self-pay

## 2022-01-02 MED ORDER — PANTOPRAZOLE SODIUM 40 MG PO TBEC: 40.0000 mg | DELAYED_RELEASE_TABLET | Freq: Two times a day (BID) | ORAL | 1 refills | Status: DC

## 2022-01-02 MED ORDER — METOPROLOL TARTRATE 25 MG PO TABS: 25.0000 mg | ORAL_TABLET | Freq: Two times a day (BID) | ORAL | 2 refills | Status: DC

## 2022-01-02 MED ORDER — SENNOSIDES-DOCUSATE SODIUM 8.6-50 MG PO TABS: 2.0000 | ORAL_TABLET | Freq: Two times a day (BID) | ORAL | 0 refills | Status: DC

## 2022-01-02 MED ORDER — POLYETHYLENE GLYCOL 3350 17 G PO PACK: 17.0000 g | PACK | Freq: Every day | ORAL | 0 refills | Status: AC

## 2022-01-02 NOTE — TOC Progression Note (Signed)
Transition of Care [facility]) - Progression Note    Patient Details  Name: MAVIS GRAVELLE MRN: [id number] Date of Birth: 28-Jun-1950  Transition [facility]) CM/SW Contact  Julien Oscar, Juliann Pulse, RN Phone Number: 01/02/2022, 12:04 PM  Clinical Narrative:  Bed offers given-await choice from [place].    Expected Discharge Plan: [place] Barriers to Discharge: Continued Medical Work up  Expected Discharge Plan and Services Expected Discharge Plan: [place]   Discharge Planning Services: CM Consult Post Acute Care Choice: [place] arrangements for the past 2 months: Single Family Home Expected Discharge Date: 12/29/21                                     Social Determinants of Health (SDOH) Interventions    Readmission Risk Interventions     No data to display

## 2022-01-02 NOTE — Progress Notes (Addendum)
Physical Therapy Treatment Patient Details Name: Kari Coleman MRN: [id number] DOB: 1950/09/29 Today's Date: 01/02/2022   History of Present Illness Pt is a 71 yo female presenting to ED on 9/25 via EMS reporting SOB found to have b/l PEs, started on heparin in ED. US revealed chronic LLE DVT.   PMH: hx of cervical cancer, GERD, HTN, hyperthyroidism, tobacco abuse, TB, R-TKA 2021, recent COVID 11/14/2021.    PT Comments    Pt assisted to bathroom (bed linen soaked with urine despite purewick) and provided with washclothes for pericare.  Pt able to stand and perform hygiene (min/guard for safety).  Pt then ambulated in hallway and agreeable to remain in recliner end of session since she states she had not been OOB today.  Pt mildly unsteady at times however no physical assist required.  Recommending assist available at home upon d/c for safety, if not, then SNF.  Addend 01/03/22 1449: Per CM, daughter reports pt lives alone.  Will update d/c recommendations to SNF as pt would benefit from assist available at home for safety.   Recommendations for follow up therapy are one component of a multi-disciplinary discharge planning process, led by the attending physician.  Recommendations may be updated based on patient status, additional functional criteria and insurance authorization.  Follow Up Recommendations  SNF Can patient physically be transported by private vehicle: Yes   Assistance Recommended at Discharge Intermittent Supervision/Assistance  Patient can return home with the following A little help with walking and/or transfers;A little help with bathing/dressing/bathroom;Assistance with cooking/housework;Help with stairs or ramp for entrance;Assist for transportation   Equipment Recommendations  None recommended by PT    Recommendations for Other Services       Precautions / Restrictions Precautions Precautions: Fall     Mobility  Bed Mobility Overal bed mobility: Needs  Assistance Bed Mobility: Supine to Sit     Supine to sit: Supervision     General bed mobility comments: pt required increased time.    Transfers Overall transfer level: Needs assistance Equipment used: Rolling walker (2 wheels) Transfers: Sit to/from Stand Sit to Stand: Min guard           General transfer comment: min/guard for safety, cues for hand placement    Ambulation/Gait Ambulation/Gait assistance: Min guard Gait Distance (Feet): 80 Feet Assistive device: Rolling walker (2 wheels) Gait Pattern/deviations: Decreased stride length, Step-through pattern Gait velocity: decreased     General Gait Details: mildly unsteady however no overt LOB observed, improved stability with bil UE support, distance to tolerance   Stairs             Wheelchair Mobility    Modified Rankin (Stroke Patients Only)       Balance           Standing balance support: No upper extremity supported, During functional activity Standing balance-Leahy Scale: Fair                              Cognition Arousal/Alertness: Awake/alert Behavior During Therapy: WFL for tasks assessed/performed, Flat affect Overall Cognitive Status: Within Functional Limits for tasks assessed                                          Exercises General Exercises - Lower Extremity Ankle Circles/Pumps: AROM, Both, 10 reps Long Arc Quad: AROM, Both, 10  reps, Seated Hip Flexion/Marching: AROM, Both, 10 reps, Seated    General Comments        Pertinent Vitals/Pain Pain Assessment Pain Assessment: No/denies pain    Home Living                          Prior Function            PT Goals (current goals can now be found in the care plan section) Progress towards PT goals: Progressing toward goals    Frequency    Min 3X/week      PT Plan Current plan remains appropriate    Co-evaluation              AM-PAC PT "6 Clicks" Mobility    Outcome Measure  Help needed turning from your back to your side while in a flat bed without using bedrails?: None Help needed moving from lying on your back to sitting on the side of a flat bed without using bedrails?: A Little Help needed moving to and from a bed to a chair (including a wheelchair)?: A Little Help needed standing up from a chair using your arms (e.g., wheelchair or bedside chair)?: A Little Help needed to walk in hospital room?: A Little Help needed climbing 3-5 steps with a railing? : A Little 6 Click Score: 19    End of Session Equipment Utilized During Treatment: Gait belt Activity Tolerance: Patient tolerated treatment well Patient left: in chair;with call bell/phone within reach;with chair alarm set Nurse Communication: Mobility status PT Visit Diagnosis: Difficulty in walking, not elsewhere classified (R26.2);Muscle weakness (generalized) (M62.81)     Time: 3382-5053 PT Time Calculation (min) (ACUTE ONLY): 17 min  Charges:  $Gait Training: 8-22 mins                     Jannette Spanner PT, DPT Physical Therapist Acute Rehabilitation Services Preferred contact method: Secure Chat Weekend Pager Only: [phone] Office: [place] 01/02/2022, 3:33 PM

## 2022-01-02 NOTE — Care Management Important Message (Signed)
Important Message  Patient Details IM Letter placed in Patient's room for Daughter Name: Kari Coleman MRN: [id number] Date of Birth: 04/27/1950   Medicare Important Message Given:  Yes     Kerin Salen 01/02/2022, 1:08 PM

## 2022-01-02 NOTE — TOC Progression Note (Signed)
Transition of Care [facility]) - Progression Note    Patient Details  Name: Kari Coleman MRN: [id number] Date of Birth: Jun 05, 1950  [facility] CM/SW Contact  Joaquin Courts, RN Phone Number: 01/02/2022, 2:50 PM  Clinical Narrative:    Patient's daughter selects [facility] for SNF.   Expected Discharge Plan: [place] Barriers to Discharge: Continued Medical Work up  Expected Discharge Plan and Services Expected Discharge Plan: [place]   Discharge Planning Services: CM Consult Post Acute Care Choice: [place] arrangements for the past 2 months: Single Family Home Expected Discharge Date: 12/29/21                                     Social Determinants of Health (SDOH) Interventions    Readmission Risk Interventions     No data to display

## 2022-01-02 NOTE — Discharge Summary (Signed)
[facility]  Physician Discharge Summary   Patient ID: Kari Coleman MRN: [id number] DOB/AGE: 1950/08/04 71 y.o.  Admit date: 12/26/2021 Discharge date:   01/02/2022   PCP: Inda Coke, PA  DISCHARGE DIAGNOSES:    Acute pulmonary embolism ([place])   Acute deep vein thrombosis (DVT) of left popliteal vein (HCC) Essential hypertension   Hypothyroidism   Chronic angle-closure glaucoma of both eyes, severe stage   GERD (gastroesophageal reflux disease)   RECOMMENDATIONS FOR OUTPATIENT FOLLOW UP: Patient on apixaban 10 mg twice a day until 10/3.  On 10/4 dose will need to be decreased to apixaban 5 mg twice a day. Pulmonology to arrange outpatient follow-up   Home Health: SNF Equipment/Devices: None  CODE STATUS: Full code  DISCHARGE CONDITION: fair  Diet recommendation: Heart healthy  INITIAL HISTORY: 71 yo female with pmh HTN, hypothyroid and recent covid infection (august 2023) who presented to ED after 3 days of sob with exertion, progressing to at rest. She states she has not had any new cough, fever, chills, no cp, dizziness, pre-syncopal symptoms. She endorses she has also had decreased appetite since Sunday but no n/v/d. On arrival she was hemodynamically stable except tachycardia to 120s.  Saturating in mid 90s on room air. Found to have bilateral central PEs with considerable clot burden. Admitted with PCCM and started on heparin infusion. Echocardiogram with normal EF, grade 1 diastolic dysfunction and no comment on any right heart strain.  Pressure seems within normal limit. Lower extremity venous Doppler with left popliteal and peroneal vein DVT.   Consultants: [place] COURSE:   Acute pulmonary embolism Most likely provoked by recent COVID infection and hospitalization.  Was hospitalized at [facility] hospital for encephalopathy back in August. She was seen by pulmonology.  Treated with heparin infusion. Echocardiogram with no  concern of right heart strain. Able to wean off from oxygen. Switched over to [employer].  Pulmonology to arrange outpatient follow-up.  She will need to stop taking aspirin.  She will need 6 months of anticoagulation.  She is tolerating her anticoagulation well.  No evidence for bleeding.  Mild drop in hemoglobin is noted though it is probably dilutional.   Acute deep vein thrombosis (DVT) of left popliteal vein Anticoagulated as mentioned above.   Essential hypertension Patient was on HCTZ lisinopril, amlodipine and metoprolol prior to admission.  Blood pressures here were borderline low.  She remains only on metoprolol at a lower dose than her usual.  Other medications have been discontinued.  Blood pressure is well controlled.     Chronic kidney disease stage IIIa vs IIIb Renal function close to baseline.   Hypothyroidism Continue home Synthroid   Constipation Continue with bowel regimen   Chronic angle-closure glaucoma of both eyes, severe stage Continue home meds   GERD (gastroesophageal reflux disease) Nausea could be due to GERD.  She was given PPI twice a day for a few days with improvement in symptoms.  Physical deconditioning Patient was seen by PT and OT.  Thought to be very deconditioned.  Will benefit from short-term rehab at skilled nursing facility.   Obesity Estimated body mass index is 32.34 kg/m as calculated from the following:   Height as of this encounter: '5\' 7"'$  (1.702 m).   Weight as of this encounter: 93.7 kg.    Patient is stable.  Okay for discharge to SNF.   PERTINENT LABS:  The results of significant diagnostics from this hospitalization (including imaging, microbiology, ancillary and laboratory)  are listed below for reference.    Microbiology: Recent Results (from the past 240 hour(s))  Resp Panel by RT-PCR (Flu A&B, Covid) Anterior Nasal Swab     Status: None   Collection Time: 12/26/21  3:33 PM   Specimen: Anterior Nasal Swab  Result Value  Ref Range Status   SARS Coronavirus 2 by RT PCR NEGATIVE NEGATIVE Final    Comment: (NOTE) SARS-CoV-2 target nucleic acids are NOT DETECTED.  The SARS-CoV-2 RNA is generally detectable in upper respiratory specimens during the acute phase of infection. The lowest concentration of SARS-CoV-2 viral copies this assay can detect is 138 copies/mL. A negative result does not preclude SARS-Cov-2 infection and should not be used as the sole basis for treatment or other patient management decisions. A negative result may occur with  improper specimen collection/handling, submission of specimen other than nasopharyngeal swab, presence of viral mutation(s) within the areas targeted by this assay, and inadequate number of viral copies(<138 copies/mL). A negative result must be combined with clinical observations, patient history, and epidemiological information. The expected result is Negative.  Fact Sheet for Patients:  EntrepreneurPulse.com.au  Fact Sheet for Healthcare Providers:  IncredibleEmployment.be  This test is no t yet approved or cleared by the [place] FDA and  has been authorized for detection and/or diagnosis of SARS-CoV-2 by FDA under an Emergency Use Authorization (EUA). This EUA will remain  in effect (meaning this test can be used) for the duration of the COVID-19 declaration under Section 564(b)(1) of the Act, 21 U.S.C.section 360bbb-3(b)(1), unless the authorization is terminated  or revoked sooner.       Influenza A by PCR NEGATIVE NEGATIVE Final   Influenza B by PCR NEGATIVE NEGATIVE Final    Comment: (NOTE) The Xpert Xpress SARS-CoV-2/FLU/RSV plus assay is intended as an aid in the diagnosis of influenza from Nasopharyngeal swab specimens and should not be used as a sole basis for treatment. Nasal washings and aspirates are unacceptable for Xpert Xpress SARS-CoV-2/FLU/RSV testing.  Fact Sheet for  Patients: EntrepreneurPulse.com.au  Fact Sheet for Healthcare Providers: IncredibleEmployment.be  This test is not yet approved or cleared by the [place] FDA and has been authorized for detection and/or diagnosis of SARS-CoV-2 by FDA under an Emergency Use Authorization (EUA). This EUA will remain in effect (meaning this test can be used) for the duration of the COVID-19 declaration under Section 564(b)(1) of the Act, 21 U.S.C. section 360bbb-3(b)(1), unless the authorization is terminated or revoked.  Performed at [facility], [place] [address] [place], [place] [address]   MRSA Next Gen by PCR, Nasal     Status: None   Collection Time: 12/26/21 10:41 PM   Specimen: Nasal Mucosa; Nasal Swab  Result Value Ref Range Status   MRSA by PCR Next Gen NOT DETECTED NOT DETECTED Final    Comment: (NOTE) The GeneXpert MRSA Assay (FDA approved for NASAL specimens only), is one component of a comprehensive MRSA colonization surveillance program. It is not intended to diagnose MRSA infection nor to guide or monitor treatment for MRSA infections. Test performance is not FDA approved in patients less than 9 years old. Performed at [facility], [place] [address], [place],  [address]      Labs:   Basic Metabolic Panel: Recent Labs  Lab 12/26/21 1533 12/27/21 0313 12/31/21 0556  NA 139 142 138  K 3.7 3.3* 3.6  CL 104 106 107  CO2 '25 25 23  '$ GLUCOSE 162* 134* 135*  BUN  18 18 27*  CREATININE 1.17* 1.10* 1.35*  CALCIUM 9.8 10.0 9.1  MG 1.7  --   --    Liver Function Tests: Recent Labs  Lab 12/26/21 1533  AST 20  ALT 21  ALKPHOS 65  BILITOT 0.7  PROT 7.7  ALBUMIN 3.6    CBC: Recent Labs  Lab 12/26/21 1533 12/27/21 0313 12/28/21 0503 12/29/21 0454 12/31/21 0556  WBC 7.9 9.4 8.6 9.2 9.4  NEUTROABS 5.8  --   --   --   --   HGB 12.6 12.3 12.9 12.7 11.2*  HCT 38.0 37.1 39.1 38.6 34.5*   MCV 91.3 91.2 91.1 90.8 92.0  PLT 247 252 253 272 270    BNP: BNP (last 3 results) Recent Labs    12/26/21 1533  BNP 70.5     IMAGING STUDIES ECHOCARDIOGRAM COMPLETE  Result Date: 12/27/2021    ECHOCARDIOGRAM REPORT   Patient Name:   Kari [facility] Date of Exam: 12/27/2021 Medical Rec #:  [id number]     Height:       67.0 in Accession #:    [id number]    Weight:       220.5 lb Date of Birth:  February 07, 1951     BSA:          2.108 m Patient Age:    56 years      BP:           151/87 mmHg Patient Gender: F             HR:           98 bpm. Exam Location:  Inpatient Procedure: 2D Echo, Color Doppler and Cardiac Doppler Indications:    Abnormal ECG  History:        Patient has prior history of Echocardiogram examinations, most                 recent 11/23/2017. Risk Factors:Hypertension.  Sonographer:    Memory Argue Referring Phys: [id number] [place]  1. Left ventricular ejection fraction, by estimation, is 65 to 70%. The left ventricle has normal function. The left ventricle has no regional wall motion abnormalities. There is mild concentric left ventricular hypertrophy. Left ventricular diastolic parameters are consistent with Grade I diastolic dysfunction (impaired relaxation).  2. Right ventricular systolic function is normal. The right ventricular size is normal. Tricuspid regurgitation signal is inadequate for assessing PA pressure.  3. The mitral valve is grossly normal. No evidence of mitral valve regurgitation. No evidence of mitral stenosis.  4. The aortic valve is tricuspid. There is mild calcification of the aortic valve. Aortic valve regurgitation is not visualized. Aortic valve sclerosis is present, with no evidence of aortic valve stenosis.  5. The inferior vena cava is normal in size with greater than 50% respiratory variability, suggesting right atrial pressure of 3 mmHg. Comparison(s): No significant change from prior study. FINDINGS  Left Ventricle: Left  ventricular ejection fraction, by estimation, is 65 to 70%. The left ventricle has normal function. The left ventricle has no regional wall motion abnormalities. The left ventricular internal cavity size was small. There is mild concentric left ventricular hypertrophy. Left ventricular diastolic parameters are consistent with Grade I diastolic dysfunction (impaired relaxation). Right Ventricle: The right ventricular size is normal. No increase in right ventricular wall thickness. Right ventricular systolic function is normal. Tricuspid regurgitation signal is inadequate for assessing PA pressure. Left Atrium: Left atrial size was normal in size. Right Atrium: Right atrial  size was normal in size. Pericardium: There is no evidence of pericardial effusion. Mitral Valve: The mitral valve is grossly normal. No evidence of mitral valve regurgitation. No evidence of mitral valve stenosis. Tricuspid Valve: The tricuspid valve is normal in structure. Tricuspid valve regurgitation is not demonstrated. No evidence of tricuspid stenosis. Aortic Valve: The aortic valve is tricuspid. There is mild calcification of the aortic valve. Aortic valve regurgitation is not visualized. Aortic valve sclerosis is present, with no evidence of aortic valve stenosis. Aortic valve mean gradient measures 3.0 mmHg. Aortic valve peak gradient measures 6.4 mmHg. Aortic valve area, by VTI measures 1.86 cm. Pulmonic Valve: The pulmonic valve was normal in structure. Pulmonic valve regurgitation is not visualized. No evidence of pulmonic stenosis. Aorta: The aortic root and ascending aorta are structurally normal, with no evidence of dilitation. Venous: The inferior vena cava is normal in size with greater than 50% respiratory variability, suggesting right atrial pressure of 3 mmHg. IAS/Shunts: No atrial level shunt detected by color flow Doppler.  LEFT VENTRICLE PLAX 2D LVIDd:         3.30 cm   Diastology LVIDs:         2.20 cm   LV e' medial:     4.57 cm/s LV PW:         1.20 cm   LV E/e' medial:  13.7 LV IVS:        1.20 cm   LV e' lateral:   6.31 cm/s LVOT diam:     2.00 cm   LV E/e' lateral: 9.9 LV SV:         35 LV SV Index:   17 LVOT Area:     3.14 cm  RIGHT VENTRICLE RV S prime:     9.57 cm/s TAPSE (M-mode): 1.6 cm LEFT ATRIUM             Index        RIGHT ATRIUM           Index LA diam:        2.40 cm 1.14 cm/m   RA Area:     13.50 cm LA Vol (A2C):   29.8 ml 14.14 ml/m  RA Volume:   33.30 ml  15.80 ml/m LA Vol (A4C):   32.4 ml 15.37 ml/m LA Biplane Vol: 34.7 ml 16.46 ml/m  AORTIC VALVE AV Area (Vmax):    2.05 cm AV Area (Vmean):   2.09 cm AV Area (VTI):     1.86 cm AV Vmax:           126.00 cm/s AV Vmean:          79.000 cm/s AV VTI:            0.191 m AV Peak Grad:      6.4 mmHg AV Mean Grad:      3.0 mmHg LVOT Vmax:         82.20 cm/s LVOT Vmean:        52.600 cm/s LVOT VTI:          0.113 m LVOT/AV VTI ratio: 0.59  AORTA Ao Root diam: 3.10 cm Ao Asc diam:  3.20 cm MITRAL VALVE MV Area (PHT): 4.89 cm     SHUNTS MV Decel Time: 155 msec     Systemic VTI:  0.11 m MV E velocity: 62.60 cm/s   Systemic Diam: 2.00 cm MV A velocity: 107.00 cm/s MV E/A ratio:  0.59 Rudean Haskell MD Electronically signed by Rudean Haskell  MD Signature Date/Time: 12/27/2021/2:23:16 PM    Final    VAS [place] LOWER EXTREMITY VENOUS (DVT)  Result Date: 12/27/2021  Lower Venous DVT Study Patient Name:  OTISHA SPICKLER [facility]  Date of Exam:   12/27/2021 Medical Rec #: [id number]      Accession #:    [id number] Date of Birth: 1950/05/27      Patient Gender: F Patient Age:   56 years Exam Location:  [facility] Procedure:      VAS [place] LOWER EXTREMITY VENOUS (DVT) Referring Phys: JESSICA MARSHALL --------------------------------------------------------------------------------  Indications: Pulmonary embolism.  Limitations: Poor ultrasound/tissue interface. Comparison Study: 05/30/2019- negative lower extremity venous duplex Performing Technologist: Maudry Mayhew MHA, RDMS, RVT, RDCS  Examination Guidelines: A complete evaluation includes B-mode imaging, spectral Doppler, color Doppler, and power Doppler as needed of all accessible portions of each vessel. Bilateral testing is considered an integral part of a complete examination. Limited examinations for reoccurring indications may be performed as noted. The reflux portion of the exam is performed with the patient in reverse Trendelenburg.  +---------+---------------+---------+-----------+----------+--------------+ RIGHT    CompressibilityPhasicitySpontaneityPropertiesThrombus Aging +---------+---------------+---------+-----------+----------+--------------+ CFV      Full           Yes      Yes                                 +---------+---------------+---------+-----------+----------+--------------+ SFJ      Full                                                        +---------+---------------+---------+-----------+----------+--------------+ FV Prox  Full                                                        +---------+---------------+---------+-----------+----------+--------------+ FV Mid   Full                                                        +---------+---------------+---------+-----------+----------+--------------+ FV DistalFull                                                        +---------+---------------+---------+-----------+----------+--------------+ PFV      Full                                                        +---------+---------------+---------+-----------+----------+--------------+ POP      Full           Yes      Yes                                 +---------+---------------+---------+-----------+----------+--------------+  PTV      Full                                                        +---------+---------------+---------+-----------+----------+--------------+ PERO     Full                                                         +---------+---------------+---------+-----------+----------+--------------+   +---------+---------------+---------+-----------+----------+--------------+ LEFT     CompressibilityPhasicitySpontaneityPropertiesThrombus Aging +---------+---------------+---------+-----------+----------+--------------+ CFV      Full           Yes      Yes                                 +---------+---------------+---------+-----------+----------+--------------+ SFJ      Full                                                        +---------+---------------+---------+-----------+----------+--------------+ FV Prox  Full                                                        +---------+---------------+---------+-----------+----------+--------------+ FV Mid   Full                                                        +---------+---------------+---------+-----------+----------+--------------+ FV DistalFull                                                        +---------+---------------+---------+-----------+----------+--------------+ PFV      Full                                                        +---------+---------------+---------+-----------+----------+--------------+ POP      None                    No                   Acute          +---------+---------------+---------+-----------+----------+--------------+ PTV      Full                    Yes                                 +---------+---------------+---------+-----------+----------+--------------+  PERO     None                    No                   Acute          +---------+---------------+---------+-----------+----------+--------------+     Summary: RIGHT: - There is no evidence of deep vein thrombosis in the lower extremity.  - No cystic structure found in the popliteal fossa.  LEFT: - Findings consistent with acute deep vein thrombosis involving the left popliteal vein, and left peroneal  veins. - No cystic structure found in the popliteal fossa.  *See table(s) above for measurements and observations. Electronically signed by Deitra Mayo MD on 12/27/2021 at 1:09:12 PM.    Final    CT Angio Chest PE W and/or Wo Contrast  Result Date: 12/26/2021 CLINICAL DATA:  Evaluate for acute pulmonary embolus. Shortness of breath and hypoxia EXAM: CT ANGIOGRAPHY CHEST WITH CONTRAST TECHNIQUE: Multidetector CT imaging of the chest was performed using the standard protocol during bolus administration of intravenous contrast. Multiplanar CT image reconstructions and MIPs were obtained to evaluate the vascular anatomy. RADIATION DOSE REDUCTION: This exam was performed according to the departmental dose-optimization program which includes automated exposure control, adjustment of the mA and/or kV according to patient size and/or use of iterative reconstruction technique. CONTRAST:  58m OMNIPAQUE IOHEXOL 350 MG/ML SOLN COMPARISON:  Coronary calcium score 08/26/2021 FINDINGS: Cardiovascular: Examination is positive for large, bilateral central obstructing pulmonary artery filling defects compatible with at least submassive pulmonary embolus. This includes a large thrombus within the left main pulmonary artery, image 56/4 and large volume clot which extends into the right lower lobe and right middle lobe pulmonary arteries,. The RV to LV ratio is calculated at 1.5 compatible with right heart strain. Heart size is upper limits of normal. Aortic atherosclerosis. No pericardial effusion. Mediastinum/Nodes: No enlarged mediastinal, hilar, or axillary lymph nodes. Thyroid gland, trachea, and esophagus demonstrate no significant findings. Lungs/Pleura: Pulmonary parenchymal detail is diminished secondary to motion artifact. No signs of pleural effusion, airspace consolidation, or pneumothorax. Mild changes of centrilobular emphysema. Upper Abdomen: No acute abnormality.  Subjective hepatic steatosis.  Musculoskeletal: No acute or suspicious osseous findings. Thoracic degenerative disc disease noted. No chest wall mass identified. Review of the MIP images confirms the above findings. IMPRESSION: 1. Examination is positive for acute PE with CT evidence of right heart strain (RV/LV Ratio = 1.5.) consistent with at least submassive (intermediate risk) PE. The presence of right heart strain has been associated with an increased risk of morbidity and mortality. Please refer to the "Code PE Focused" order set in EPIC. 2. Subjective hepatic steatosis. 3. Aortic Atherosclerosis (ICD10-I70.0) and Emphysema (ICD10-J43.9). Critical Value/emergent results were called by telephone at the time of interpretation on 12/26/2021 at 6:20 pm to provider [facility], who verbally acknowledged these results. Electronically Signed   By: TKerby MoorsM.D.   On: 12/26/2021 18:20    DISCHARGE EXAMINATION: Vitals:   01/01/22 2032 01/02/22 0500 01/02/22 0543 01/02/22 0749  BP: 131/86  126/81   Pulse: 83  71   Resp: 19  18   Temp: 98.3 F (36.8 C)  98.8 F (37.1 C)   TempSrc: Oral  Oral   SpO2: 97%  100% 99%  Weight:  97 kg    Height:       General appearance: Awake alert.  In no distress Resp: Clear to auscultation bilaterally.  Normal  effort Cardio: S1-S2 is normal regular.  No S3-S4.  No rubs murmurs or bruit GI: Abdomen is soft.  Nontender nondistended.  Bowel sounds are present normal.  No masses organomegaly   DISPOSITION: SNF  Discharge Instructions     Call MD for:  difficulty breathing, headache or visual disturbances   Complete by: As directed    Call MD for:  extreme fatigue   Complete by: As directed    Call MD for:  persistant dizziness or light-headedness   Complete by: As directed    Call MD for:  persistant nausea and vomiting   Complete by: As directed    Call MD for:  severe uncontrolled pain   Complete by: As directed    Call MD for:  temperature >100.4   Complete by: As directed     Diet - low sodium heart healthy   Complete by: As directed    Discharge instructions   Complete by: As directed    Please take your medications as prescribed.  You will hear from the lung doctors office to set up an appointment for follow-up.  Please follow-up with your primary care provider in 1 week.  Watch for signs of bleeding as has been explained to you.  You were cared for by a hospitalist during your hospital stay. If you have any questions about your discharge medications or the care you received while you were in the hospital after you are discharged, you can call the unit and asked to speak with the hospitalist on call if the hospitalist that took care of you is not available. Once you are discharged, your primary care physician will handle any further medical issues. Please note that NO REFILLS for any discharge medications will be authorized once you are discharged, as it is imperative that you return to your primary care physician (or establish a relationship with a primary care physician if you do not have one) for your aftercare needs so that they can reassess your need for medications and monitor your lab values. If you do not have a primary care physician, you can call [phone] for a physician referral.   Increase activity slowly   Complete by: As directed           Allergies as of 01/02/2022       Reactions   Doxycycline Nausea Only   Codeine Nausea And Vomiting   Pollen Extract Other (See Comments)   Unknown reaction         Medication List     STOP taking these medications    amLODipine 10 MG tablet Commonly known as: NORVASC   aspirin EC 81 MG tablet   clindamycin 150 MG capsule Commonly known as: CLEOCIN   hydrochlorothiazide 25 MG tablet Commonly known as: HYDRODIURIL   lisinopril 40 MG tablet Commonly known as: ZESTRIL   metoprolol succinate 100 MG 24 hr tablet Commonly known as: TOPROL-XL       TAKE these medications    acetaminophen 325  MG tablet Commonly known as: TYLENOL Take 650 mg by mouth every 4 (four) hours as needed for mild pain or moderate pain.   albuterol 108 (90 Base) MCG/ACT inhaler Commonly known as: Proventil HFA Inhale 2 puffs into the lungs every 6 (six) hours as needed for wheezing or shortness of breath.   albuterol (2.5 MG/3ML) 0.083% nebulizer solution Commonly known as: PROVENTIL Take 3 mLs (2.5 mg total) by nebulization every 6 (six) hours as needed for wheezing or shortness of  breath.   Apixaban Starter Pack ('10mg'$  and '5mg'$ ) Commonly known as: ELIQUIS STARTER PACK Take as directed on package: start with two-'5mg'$  tablets twice daily for 7 days. On day 8, switch to one-'5mg'$  tablet twice daily.   apixaban 5 MG Tabs tablet Commonly known as: ELIQUIS Take 1 tablet (5 mg total) by mouth 2 (two) times daily. Please start after you have completed all the tablets in the starter pack. Start taking on: January 04, 2022   budesonide-formoterol 80-4.5 MCG/ACT inhaler Commonly known as: SYMBICORT Inhale 2 puffs into the lungs 2 (two) times daily.   dorzolamide-timolol 22.3-6.8 MG/ML ophthalmic solution Commonly known as: COSOPT Place 1 drop into the right eye 2 (two) times daily.   latanoprost 0.005 % ophthalmic solution Commonly known as: XALATAN Place 1 drop into both eyes at bedtime. As directed   levothyroxine 175 MCG tablet Commonly known as: SYNTHROID Take 1 tablet (175 mcg total) by mouth daily.   metoprolol tartrate 25 MG tablet Commonly known as: LOPRESSOR Take 1 tablet (25 mg total) by mouth 2 (two) times daily.   pantoprazole 40 MG tablet Commonly known as: PROTONIX Take 1 tablet (40 mg total) by mouth 2 (two) times daily before a meal. What changed:  medication strength how much to take when to take this   polyethylene glycol 17 g packet Commonly known as: MIRALAX / GLYCOLAX Take 17 g by mouth daily. Start taking on: January 03, 2022   pravastatin 10 MG tablet Commonly known  as: PRAVACHOL Take 1 tablet (10 mg total) by mouth daily.   prednisoLONE acetate 1 % ophthalmic suspension Commonly known as: PRED FORTE Place 1 drop into both eyes every 2 (two) hours while awake.   senna-docusate 8.6-50 MG tablet Commonly known as: Senokot-S Take 2 tablets by mouth 2 (two) times daily.   Vitamin D-3 25 MCG (1000 UT) Caps Take 2,000 Units by mouth daily.          Follow-up Information     Inda Coke, [place]. Schedule an appointment as soon as possible for a visit in 1 week(s).   Specialty: Physician Assistant Why: post hospitalization follow up Contact information: [place] [address] [phone]                 TOTAL DISCHARGE TIME: 23 minutes  [place]  [facility] Pager on [email or web address]  01/02/2022, 10:04 AM

## 2022-01-02 NOTE — TOC Progression Note (Addendum)
Transition of Care [facility]) - Progression Note    Patient Details  Name: Kari Coleman MRN: [id number] Date of Birth: 06-06-1950  Transition of Care [facility]) CM/SW Contact  Tanuj Mullens, Juliann Pulse, RN Phone Number: 01/02/2022, 4:41 PM  Clinical Narrative: Noted PT recc HHPT-  unable to leave message w/dtr Crystal(vm full) will f/u.  -2p-spoke to dtr Crystal-states patient home alone, unable to get to the door to open if [facility] is set up,[facility].Noted PT recc HHPT.    Expected Discharge Plan: [place] Barriers to Discharge: Continued Medical Work up  Expected Discharge Plan and Services Expected Discharge Plan: [place]   Discharge Planning Services: CM Consult Post Acute Care Choice: [place] arrangements for the past 2 months: Single Family Home Expected Discharge Date: 12/29/21                                     Social Determinants of Health (SDOH) Interventions    Readmission Risk Interventions     No data to display

## 2022-01-03 MED ORDER — APIXABAN 5 MG PO TABS: 5.0000 mg | ORAL_TABLET | Freq: Two times a day (BID) | ORAL | 2 refills | Status: DC

## 2022-01-03 MED ORDER — PREDNISOLONE ACETATE 1 % OP SUSP
1.0000 [drp] | OPHTHALMIC | Status: DC
  Administered 2022-01-03 – 2022-01-04 (×6): 1 [drp] via OPHTHALMIC

## 2022-01-03 MED ORDER — PREDNISOLONE ACETATE 1 % OP SUSP: 1.0000 [drp] | OPHTHALMIC | 0 refills | Status: DC

## 2022-01-03 MED ORDER — PANTOPRAZOLE SODIUM 40 MG PO TBEC: 40.0000 mg | DELAYED_RELEASE_TABLET | Freq: Two times a day (BID) | ORAL | 1 refills | Status: DC

## 2022-01-03 MED ORDER — APIXABAN 5 MG PO TABS: 5.0000 mg | ORAL_TABLET | Freq: Two times a day (BID) | ORAL | 0 refills | Status: DC

## 2022-01-03 NOTE — Progress Notes (Signed)
Mobility Specialist - Progress Note   01/03/22 1400  Mobility  Activity Ambulated with assistance in hallway  Activity Response Tolerated well  Distance Ambulated (ft) 100 ft  $Mobility charge 1 Mobility  Level of Assistance Standby assist, set-up cues, supervision of patient - no hands on  Assistive Device Front wheel walker  Range of Motion/Exercises Active   Pt was found in recliner chair and agreeable to ambulate. Pt became fatigued and at EOS returned to recliner chair with all necessities in reach and chair alarm on.  Ferd Hibbs Mobility Specialist

## 2022-01-03 NOTE — TOC Progression Note (Signed)
Transition of Care [facility]) - Progression Note    Patient Details  Name: Kari Coleman MRN: [id number] Date of Birth: 05-01-1950  Transition of Care [facility]) CM/SW Contact  Joette Schmoker, Juliann Pulse, RN Phone Number: 01/03/2022, 4:10 PM  Clinical Narrative:  Conard Novak Hassell Halim JX#[id number]     Expected Discharge Plan: Skilled Nursing Facility Barriers to Discharge: Insurance Authorization  Expected Discharge Plan and Services Expected Discharge Plan: [place]   Discharge Planning Services: CM Consult Post Acute Care Choice: [place] arrangements for the past 2 months: Single Family Home Expected Discharge Date: 12/29/21                                     Social Determinants of Health (SDOH) Interventions    Readmission Risk Interventions     No data to display

## 2022-01-03 NOTE — Progress Notes (Signed)
Mobility Specialist - Progress Note   01/03/22 1126  Mobility  Activity Ambulated with assistance in hallway  Activity Response Tolerated well  Distance Ambulated (ft) 100 ft  $Mobility charge 1 Mobility  Level of Assistance Standby assist, set-up cues, supervision of patient - no hands on  Assistive Device Front wheel walker  Range of Motion/Exercises Active   Pt was found in bed and agreeable to ambulate. Stated feeling tired before beginning ambulation. At EOS returned to recliner chair with all necessities in reach.  Ferd Hibbs Mobility Specialist

## 2022-01-03 NOTE — Discharge Summary (Signed)
[facility]  Physician Discharge Summary   Patient ID: Kari Coleman MRN: [id number] DOB/AGE: 05-05-1950 71 y.o.  Admit date: 12/26/2021 Discharge date:   01/03/2022   PCP: Inda Coke, PA  DISCHARGE DIAGNOSES:    Acute pulmonary embolism ([place])   Acute deep vein thrombosis (DVT) of left popliteal vein (HCC) Essential hypertension   Hypothyroidism   Chronic angle-closure glaucoma of both eyes, severe stage   GERD (gastroesophageal reflux disease)   RECOMMENDATIONS FOR OUTPATIENT FOLLOW UP: Patient on apixaban 10 mg twice a day until 10/3.  On 10/4 dose will need to be decreased to apixaban 5 mg twice a day. Pulmonology to arrange outpatient follow-up   Home Health: SNF Equipment/Devices: None  CODE STATUS: Full code  DISCHARGE CONDITION: fair  Diet recommendation: Heart healthy  INITIAL HISTORY: 71 yo female with pmh HTN, hypothyroid and recent covid infection (august 2023) who presented to ED after 3 days of sob with exertion, progressing to at rest. She states she has not had any new cough, fever, chills, no cp, dizziness, pre-syncopal symptoms. She endorses she has also had decreased appetite since Sunday but no n/v/d. On arrival she was hemodynamically stable except tachycardia to 120s.  Saturating in mid 90s on room air. Found to have bilateral central PEs with considerable clot burden. Admitted with PCCM and started on heparin infusion. Echocardiogram with normal EF, grade 1 diastolic dysfunction and no comment on any right heart strain.  Pressure seems within normal limit. Lower extremity venous Doppler with left popliteal and peroneal vein DVT.   Consultants: [place] COURSE:   Acute pulmonary embolism Most likely provoked by recent COVID infection and hospitalization.  Was hospitalized at [facility] hospital for encephalopathy back in August. She was seen by pulmonology.  Treated with heparin infusion. Echocardiogram with no  concern of right heart strain. Able to wean off from oxygen. Switched over to [employer].  Pulmonology to arrange outpatient follow-up.  She will need to stop taking aspirin.  She will need 6 months of anticoagulation.  She is tolerating her anticoagulation well.  No evidence for bleeding.  Mild drop in hemoglobin is noted though it is probably dilutional.   Acute deep vein thrombosis (DVT) of left popliteal vein Anticoagulated as mentioned above.   Essential hypertension Patient was on HCTZ lisinopril, amlodipine and metoprolol prior to admission.  Blood pressures here were borderline low.  She remains only on metoprolol at a lower dose than her usual.  Other medications have been discontinued.  Blood pressure is well controlled.  This can be further managed in the outpatient setting.   Chronic kidney disease stage IIIa vs IIIb Renal function close to baseline.   Hypothyroidism Continue home Synthroid   Constipation Continue with bowel regimen   Chronic angle-closure glaucoma of both eyes, severe stage Continue home meds   GERD (gastroesophageal reflux disease) Nausea could be due to GERD.  She was given PPI twice a day with improvement in symptoms.  Physical deconditioning Patient was seen by PT and OT.  Thought to be very deconditioned.  Will benefit from short-term rehab at skilled nursing facility.   Obesity Estimated body mass index is 32.34 kg/m as calculated from the following:   Height as of this encounter: '5\' 7"'$  (1.702 m).   Weight as of this encounter: 93.7 kg.    Patient is stable.  Okay for discharge to SNF when bed is available.   PERTINENT LABS:  The results of significant diagnostics from  this hospitalization (including imaging, microbiology, ancillary and laboratory) are listed below for reference.    Microbiology: Recent Results (from the past 240 hour(s))  Resp Panel by RT-PCR (Flu A&B, Covid) Anterior Nasal Swab     Status: None   Collection Time:  12/26/21  3:33 PM   Specimen: Anterior Nasal Swab  Result Value Ref Range Status   SARS Coronavirus 2 by RT PCR NEGATIVE NEGATIVE Final    Comment: (NOTE) SARS-CoV-2 target nucleic acids are NOT DETECTED.  The SARS-CoV-2 RNA is generally detectable in upper respiratory specimens during the acute phase of infection. The lowest concentration of SARS-CoV-2 viral copies this assay can detect is 138 copies/mL. A negative result does not preclude SARS-Cov-2 infection and should not be used as the sole basis for treatment or other patient management decisions. A negative result may occur with  improper specimen collection/handling, submission of specimen other than nasopharyngeal swab, presence of viral mutation(s) within the areas targeted by this assay, and inadequate number of viral copies(<138 copies/mL). A negative result must be combined with clinical observations, patient history, and epidemiological information. The expected result is Negative.  Fact Sheet for Patients:  EntrepreneurPulse.com.au  Fact Sheet for Healthcare Providers:  IncredibleEmployment.be  This test is no t yet approved or cleared by the [place] FDA and  has been authorized for detection and/or diagnosis of SARS-CoV-2 by FDA under an Emergency Use Authorization (EUA). This EUA will remain  in effect (meaning this test can be used) for the duration of the COVID-19 declaration under Section 564(b)(1) of the Act, 21 U.S.C.section 360bbb-3(b)(1), unless the authorization is terminated  or revoked sooner.       Influenza A by PCR NEGATIVE NEGATIVE Final   Influenza B by PCR NEGATIVE NEGATIVE Final    Comment: (NOTE) The Xpert Xpress SARS-CoV-2/FLU/RSV plus assay is intended as an aid in the diagnosis of influenza from Nasopharyngeal swab specimens and should not be used as a sole basis for treatment. Nasal washings and aspirates are unacceptable for Xpert Xpress  SARS-CoV-2/FLU/RSV testing.  Fact Sheet for Patients: EntrepreneurPulse.com.au  Fact Sheet for Healthcare Providers: IncredibleEmployment.be  This test is not yet approved or cleared by the [place] FDA and has been authorized for detection and/or diagnosis of SARS-CoV-2 by FDA under an Emergency Use Authorization (EUA). This EUA will remain in effect (meaning this test can be used) for the duration of the COVID-19 declaration under Section 564(b)(1) of the Act, 21 U.S.C. section 360bbb-3(b)(1), unless the authorization is terminated or revoked.  Performed at [facility], [place] [address] [place], [place] [address]   MRSA Next Gen by PCR, Nasal     Status: None   Collection Time: 12/26/21 10:41 PM   Specimen: Nasal Mucosa; Nasal Swab  Result Value Ref Range Status   MRSA by PCR Next Gen NOT DETECTED NOT DETECTED Final    Comment: (NOTE) The GeneXpert MRSA Assay (FDA approved for NASAL specimens only), is one component of a comprehensive MRSA colonization surveillance program. It is not intended to diagnose MRSA infection nor to guide or monitor treatment for MRSA infections. Test performance is not FDA approved in patients less than 50 years old. Performed at [facility], [place] [address] [place], [place] [address]      Labs:   Basic Metabolic Panel: Recent Labs  Lab 12/31/21 0556  NA 138  K 3.6  CL 107  CO2 23  GLUCOSE 135*  BUN 27*  CREATININE 1.35*  CALCIUM  9.1     CBC: Recent Labs  Lab 12/28/21 0503 12/29/21 0454 12/31/21 0556  WBC 8.6 9.2 9.4  HGB 12.9 12.7 11.2*  HCT 39.1 38.6 34.5*  MCV 91.1 90.8 92.0  PLT 253 272 270    BNP: BNP (last 3 results) Recent Labs    12/26/21 1533  BNP 70.5     IMAGING STUDIES ECHOCARDIOGRAM COMPLETE  Result Date: 12/27/2021    ECHOCARDIOGRAM REPORT   Patient Name:   KAILYN VANDERSLICE [facility] Date of Exam: 12/27/2021 Medical Rec #:   [id number]     Height:       67.0 in Accession #:    [id number]    Weight:       220.5 lb Date of Birth:  Jul 14, 1950     BSA:          2.108 m Patient Age:    7 years      BP:           151/87 mmHg Patient Gender: F             HR:           98 bpm. Exam Location:  Inpatient Procedure: 2D Echo, Color Doppler and Cardiac Doppler Indications:    Abnormal ECG  History:        Patient has prior history of Echocardiogram examinations, most                 recent 11/23/2017. Risk Factors:Hypertension.  Sonographer:    Memory Argue Referring Phys: [id number] [place]  1. Left ventricular ejection fraction, by estimation, is 65 to 70%. The left ventricle has normal function. The left ventricle has no regional wall motion abnormalities. There is mild concentric left ventricular hypertrophy. Left ventricular diastolic parameters are consistent with Grade I diastolic dysfunction (impaired relaxation).  2. Right ventricular systolic function is normal. The right ventricular size is normal. Tricuspid regurgitation signal is inadequate for assessing PA pressure.  3. The mitral valve is grossly normal. No evidence of mitral valve regurgitation. No evidence of mitral stenosis.  4. The aortic valve is tricuspid. There is mild calcification of the aortic valve. Aortic valve regurgitation is not visualized. Aortic valve sclerosis is present, with no evidence of aortic valve stenosis.  5. The inferior vena cava is normal in size with greater than 50% respiratory variability, suggesting right atrial pressure of 3 mmHg. Comparison(s): No significant change from prior study. FINDINGS  Left Ventricle: Left ventricular ejection fraction, by estimation, is 65 to 70%. The left ventricle has normal function. The left ventricle has no regional wall motion abnormalities. The left ventricular internal cavity size was small. There is mild concentric left ventricular hypertrophy. Left ventricular diastolic parameters are  consistent with Grade I diastolic dysfunction (impaired relaxation). Right Ventricle: The right ventricular size is normal. No increase in right ventricular wall thickness. Right ventricular systolic function is normal. Tricuspid regurgitation signal is inadequate for assessing PA pressure. Left Atrium: Left atrial size was normal in size. Right Atrium: Right atrial size was normal in size. Pericardium: There is no evidence of pericardial effusion. Mitral Valve: The mitral valve is grossly normal. No evidence of mitral valve regurgitation. No evidence of mitral valve stenosis. Tricuspid Valve: The tricuspid valve is normal in structure. Tricuspid valve regurgitation is not demonstrated. No evidence of tricuspid stenosis. Aortic Valve: The aortic valve is tricuspid. There is mild calcification of the aortic valve. Aortic valve regurgitation is not visualized. Aortic valve sclerosis is  present, with no evidence of aortic valve stenosis. Aortic valve mean gradient measures 3.0 mmHg. Aortic valve peak gradient measures 6.4 mmHg. Aortic valve area, by VTI measures 1.86 cm. Pulmonic Valve: The pulmonic valve was normal in structure. Pulmonic valve regurgitation is not visualized. No evidence of pulmonic stenosis. Aorta: The aortic root and ascending aorta are structurally normal, with no evidence of dilitation. Venous: The inferior vena cava is normal in size with greater than 50% respiratory variability, suggesting right atrial pressure of 3 mmHg. IAS/Shunts: No atrial level shunt detected by color flow Doppler.  LEFT VENTRICLE PLAX 2D LVIDd:         3.30 cm   Diastology LVIDs:         2.20 cm   LV e' medial:    4.57 cm/s LV PW:         1.20 cm   LV E/e' medial:  13.7 LV IVS:        1.20 cm   LV e' lateral:   6.31 cm/s LVOT diam:     2.00 cm   LV E/e' lateral: 9.9 LV SV:         35 LV SV Index:   17 LVOT Area:     3.14 cm  RIGHT VENTRICLE RV S prime:     9.57 cm/s TAPSE (M-mode): 1.6 cm LEFT ATRIUM             Index         RIGHT ATRIUM           Index LA diam:        2.40 cm 1.14 cm/m   RA Area:     13.50 cm LA Vol (A2C):   29.8 ml 14.14 ml/m  RA Volume:   33.30 ml  15.80 ml/m LA Vol (A4C):   32.4 ml 15.37 ml/m LA Biplane Vol: 34.7 ml 16.46 ml/m  AORTIC VALVE AV Area (Vmax):    2.05 cm AV Area (Vmean):   2.09 cm AV Area (VTI):     1.86 cm AV Vmax:           126.00 cm/s AV Vmean:          79.000 cm/s AV VTI:            0.191 m AV Peak Grad:      6.4 mmHg AV Mean Grad:      3.0 mmHg LVOT Vmax:         82.20 cm/s LVOT Vmean:        52.600 cm/s LVOT VTI:          0.113 m LVOT/AV VTI ratio: 0.59  AORTA Ao Root diam: 3.10 cm Ao Asc diam:  3.20 cm MITRAL VALVE MV Area (PHT): 4.89 cm     SHUNTS MV Decel Time: 155 msec     Systemic VTI:  0.11 m MV E velocity: 62.60 cm/s   Systemic Diam: 2.00 cm MV A velocity: 107.00 cm/s MV E/A ratio:  0.59 Rudean Haskell MD Electronically signed by Rudean Haskell MD Signature Date/Time: 12/27/2021/2:23:16 PM    Final    VAS [place] LOWER EXTREMITY VENOUS (DVT)  Result Date: 12/27/2021  Lower Venous DVT Study Patient Name:  JASPREET BODNER [facility]  Date of Exam:   12/27/2021 Medical Rec #: [id number]      Accession #:    [id number] Date of Birth: 06-05-1950      Patient Gender: F Patient Age:   18 years Exam [facility] Procedure:      VAS [place] LOWER EXTREMITY VENOUS (DVT) Referring Phys: JESSICA MARSHALL --------------------------------------------------------------------------------  Indications: Pulmonary embolism.  Limitations: Poor ultrasound/tissue interface. Comparison Study: 05/30/2019- negative lower extremity venous duplex Performing Technologist: Maudry Mayhew MHA, RDMS, RVT, RDCS  Examination Guidelines: A complete evaluation includes B-mode imaging, spectral Doppler, color Doppler, and power Doppler as needed of all accessible portions of each vessel. Bilateral testing is considered an integral part of a complete examination. Limited examinations for reoccurring  indications may be performed as noted. The reflux portion of the exam is performed with the patient in reverse Trendelenburg.  +---------+---------------+---------+-----------+----------+--------------+ RIGHT    CompressibilityPhasicitySpontaneityPropertiesThrombus Aging +---------+---------------+---------+-----------+----------+--------------+ CFV      Full           Yes      Yes                                 +---------+---------------+---------+-----------+----------+--------------+ SFJ      Full                                                        +---------+---------------+---------+-----------+----------+--------------+ FV Prox  Full                                                        +---------+---------------+---------+-----------+----------+--------------+ FV Mid   Full                                                        +---------+---------------+---------+-----------+----------+--------------+ FV DistalFull                                                        +---------+---------------+---------+-----------+----------+--------------+ PFV      Full                                                        +---------+---------------+---------+-----------+----------+--------------+ POP      Full           Yes      Yes                                 +---------+---------------+---------+-----------+----------+--------------+ PTV      Full                                                        +---------+---------------+---------+-----------+----------+--------------+ PERO  Full                                                        +---------+---------------+---------+-----------+----------+--------------+   +---------+---------------+---------+-----------+----------+--------------+ LEFT     CompressibilityPhasicitySpontaneityPropertiesThrombus Aging  +---------+---------------+---------+-----------+----------+--------------+ CFV      Full           Yes      Yes                                 +---------+---------------+---------+-----------+----------+--------------+ SFJ      Full                                                        +---------+---------------+---------+-----------+----------+--------------+ FV Prox  Full                                                        +---------+---------------+---------+-----------+----------+--------------+ FV Mid   Full                                                        +---------+---------------+---------+-----------+----------+--------------+ FV DistalFull                                                        +---------+---------------+---------+-----------+----------+--------------+ PFV      Full                                                        +---------+---------------+---------+-----------+----------+--------------+ POP      None                    No                   Acute          +---------+---------------+---------+-----------+----------+--------------+ PTV      Full                    Yes                                 +---------+---------------+---------+-----------+----------+--------------+ PERO     None                    No                   Acute          +---------+---------------+---------+-----------+----------+--------------+  Summary: RIGHT: - There is no evidence of deep vein thrombosis in the lower extremity.  - No cystic structure found in the popliteal fossa.  LEFT: - Findings consistent with acute deep vein thrombosis involving the left popliteal vein, and left peroneal veins. - No cystic structure found in the popliteal fossa.  *See table(s) above for measurements and observations. Electronically signed by Deitra Mayo MD on 12/27/2021 at 1:09:12 PM.    Final    CT Angio Chest PE W and/or Wo  Contrast  Result Date: 12/26/2021 CLINICAL DATA:  Evaluate for acute pulmonary embolus. Shortness of breath and hypoxia EXAM: CT ANGIOGRAPHY CHEST WITH CONTRAST TECHNIQUE: Multidetector CT imaging of the chest was performed using the standard protocol during bolus administration of intravenous contrast. Multiplanar CT image reconstructions and MIPs were obtained to evaluate the vascular anatomy. RADIATION DOSE REDUCTION: This exam was performed according to the departmental dose-optimization program which includes automated exposure control, adjustment of the mA and/or kV according to patient size and/or use of iterative reconstruction technique. CONTRAST:  68m OMNIPAQUE IOHEXOL 350 MG/ML SOLN COMPARISON:  Coronary calcium score 08/26/2021 FINDINGS: Cardiovascular: Examination is positive for large, bilateral central obstructing pulmonary artery filling defects compatible with at least submassive pulmonary embolus. This includes a large thrombus within the left main pulmonary artery, image 56/4 and large volume clot which extends into the right lower lobe and right middle lobe pulmonary arteries,. The RV to LV ratio is calculated at 1.5 compatible with right heart strain. Heart size is upper limits of normal. Aortic atherosclerosis. No pericardial effusion. Mediastinum/Nodes: No enlarged mediastinal, hilar, or axillary lymph nodes. Thyroid gland, trachea, and esophagus demonstrate no significant findings. Lungs/Pleura: Pulmonary parenchymal detail is diminished secondary to motion artifact. No signs of pleural effusion, airspace consolidation, or pneumothorax. Mild changes of centrilobular emphysema. Upper Abdomen: No acute abnormality.  Subjective hepatic steatosis. Musculoskeletal: No acute or suspicious osseous findings. Thoracic degenerative disc disease noted. No chest wall mass identified. Review of the MIP images confirms the above findings. IMPRESSION: 1. Examination is positive for acute PE with CT  evidence of right heart strain (RV/LV Ratio = 1.5.) consistent with at least submassive (intermediate risk) PE. The presence of right heart strain has been associated with an increased risk of morbidity and mortality. Please refer to the "Code PE Focused" order set in EPIC. 2. Subjective hepatic steatosis. 3. Aortic Atherosclerosis (ICD10-I70.0) and Emphysema (ICD10-J43.9). Critical Value/emergent results were called by telephone at the time of interpretation on 12/26/2021 at 6:20 pm to [facility] who verbally acknowledged these results. Electronically Signed   By: TKerby MoorsM.D.   On: 12/26/2021 18:20    DISCHARGE EXAMINATION: Vitals:   01/02/22 1434 01/02/22 2045 01/03/22 0624 01/03/22 0758  BP: 128/84 121/72 (!) 143/87   Pulse: 76 82 66   Resp: '20 20 19   '$ Temp: 98.2 F (36.8 C) 99.2 F (37.3 C) 98.2 F (36.8 C)   TempSrc:  Oral Oral   SpO2: 100% 100% 97% 97%  Weight:   96.5 kg   Height:       General appearance: Awake alert.  In no distress Resp: Clear to auscultation bilaterally.  Normal effort Cardio: S1-S2 is normal regular.  No S3-S4.  No rubs murmurs or bruit GI: Abdomen is soft.  Nontender nondistended.  Bowel sounds are present normal.  No masses organomegaly   DISPOSITION: SNF  Discharge Instructions     Call MD for:  difficulty breathing, headache or visual disturbances  Complete by: As directed    Call MD for:  extreme fatigue   Complete by: As directed    Call MD for:  persistant dizziness or light-headedness   Complete by: As directed    Call MD for:  persistant nausea and vomiting   Complete by: As directed    Call MD for:  severe uncontrolled pain   Complete by: As directed    Call MD for:  temperature >100.4   Complete by: As directed    Diet - low sodium heart healthy   Complete by: As directed    Discharge instructions   Complete by: As directed    Please take your medications as prescribed.  You will hear from the lung doctors office  to set up an appointment for follow-up.  Please follow-up with your primary care provider in 1 week.  Watch for signs of bleeding as has been explained to you.  You were cared for by a hospitalist during your hospital stay. If you have any questions about your discharge medications or the care you received while you were in the hospital after you are discharged, you can call the unit and asked to speak with the hospitalist on call if the hospitalist that took care of you is not available. Once you are discharged, your primary care physician will handle any further medical issues. Please note that NO REFILLS for any discharge medications will be authorized once you are discharged, as it is imperative that you return to your primary care physician (or establish a relationship with a primary care physician if you do not have one) for your aftercare needs so that they can reassess your need for medications and monitor your lab values. If you do not have a primary care physician, you can call [phone] for a physician referral.   Increase activity slowly   Complete by: As directed           Allergies as of 01/03/2022       Reactions   Doxycycline Nausea Only   Codeine Nausea And Vomiting   Pollen Extract Other (See Comments)   Unknown reaction         Medication List     STOP taking these medications    amLODipine 10 MG tablet Commonly known as: NORVASC   aspirin EC 81 MG tablet   clindamycin 150 MG capsule Commonly known as: CLEOCIN   hydrochlorothiazide 25 MG tablet Commonly known as: HYDRODIURIL   lisinopril 40 MG tablet Commonly known as: ZESTRIL   metoprolol succinate 100 MG 24 hr tablet Commonly known as: TOPROL-XL       TAKE these medications    acetaminophen 325 MG tablet Commonly known as: TYLENOL Take 650 mg by mouth every 4 (four) hours as needed for mild pain or moderate pain.   albuterol 108 (90 Base) MCG/ACT inhaler Commonly known as: Proventil  HFA Inhale 2 puffs into the lungs every 6 (six) hours as needed for wheezing or shortness of breath.   albuterol (2.5 MG/3ML) 0.083% nebulizer solution Commonly known as: PROVENTIL Take 3 mLs (2.5 mg total) by nebulization every 6 (six) hours as needed for wheezing or shortness of breath.   apixaban 5 MG Tabs tablet Commonly known as: ELIQUIS Take 1 tablet (5 mg total) by mouth 2 (two) times daily. Start taking on: January 04, 2022   budesonide-formoterol 80-4.5 MCG/ACT inhaler Commonly known as: SYMBICORT Inhale 2 puffs into the lungs 2 (two) times daily.   dorzolamide-timolol 22.3-6.8 MG/ML  ophthalmic solution Commonly known as: COSOPT Place 1 drop into the right eye 2 (two) times daily.   latanoprost 0.005 % ophthalmic solution Commonly known as: XALATAN Place 1 drop into both eyes at bedtime. As directed   levothyroxine 175 MCG tablet Commonly known as: SYNTHROID Take 1 tablet (175 mcg total) by mouth daily.   metoprolol tartrate 25 MG tablet Commonly known as: LOPRESSOR Take 1 tablet (25 mg total) by mouth 2 (two) times daily.   pantoprazole 40 MG tablet Commonly known as: PROTONIX Take 1 tablet (40 mg total) by mouth 2 (two) times daily before a meal. What changed:  medication strength how much to take when to take this   polyethylene glycol 17 g packet Commonly known as: MIRALAX / GLYCOLAX Take 17 g by mouth daily.   pravastatin 10 MG tablet Commonly known as: PRAVACHOL Take 1 tablet (10 mg total) by mouth daily.   prednisoLONE acetate 1 % ophthalmic suspension Commonly known as: PRED FORTE Place 1 drop into both eyes every 4 (four) hours. What changed: when to take this   senna-docusate 8.6-50 MG tablet Commonly known as: Senokot-S Take 2 tablets by mouth 2 (two) times daily.   Vitamin D-3 25 MCG (1000 UT) Caps Take 2,000 Units by mouth daily.          Contact information for follow-up providers     Inda Coke, [place]. Schedule an  appointment as soon as possible for a visit in 1 week(s).   Specialty: Physician Assistant Why: post hospitalization follow up Contact information: [place] [address] [phone]              Contact information for after-discharge care     Destination     [facility] .   Service: Skilled Nursing Contact information: [place] [phone]                     TOTAL DISCHARGE TIME: 1 minutes  [place]  [facility] Pager on [email or web address]  01/03/2022, 11:51 AM

## 2022-01-04 DIAGNOSIS — I82532 Chronic embolism and thrombosis of left popliteal vein: Secondary | ICD-10-CM

## 2022-01-04 DIAGNOSIS — H402233 Chronic angle-closure glaucoma, bilateral, severe stage: Secondary | ICD-10-CM

## 2022-01-04 DIAGNOSIS — E890 Postprocedural hypothyroidism: Secondary | ICD-10-CM

## 2022-01-04 NOTE — TOC Progression Note (Addendum)
Transition of Care [facility]) - Progression Note    Patient Details  Name: Kari Coleman MRN: [id number] Date of Birth: 1951/01/25  [facility]) CM/SW Contact  Lariya Kinzie, Juliann Pulse, RN Phone Number: 01/04/2022, 10:32 AM  Clinical Narrative: Received auth(eff through 10/5) [id number] for [facility] has bed available. MD notified. Crystal(dtr) informed.     Expected Discharge Plan: [place] Barriers to Discharge: Continued Medical Work up  Expected Discharge Plan and Services Expected Discharge Plan: [place]   Discharge Planning Services: CM Consult Post Acute Care Choice: [place] arrangements for the past 2 months: Single Family Home Expected Discharge Date: 12/29/21                                     Social Determinants of Health (SDOH) Interventions    Readmission Risk Interventions     No data to display

## 2022-01-04 NOTE — Progress Notes (Signed)
Mobility Specialist - Progress Note   01/04/22 1000  Mobility  Activity Ambulated independently in hallway  Activity Response Tolerated well  Distance Ambulated (ft) 50 ft  $Mobility charge 1 Mobility  Level of Assistance Contact guard assist, steadying assist  Assistive Device Front wheel walker  Range of Motion/Exercises Active   Pt was found in bed and agreeable to ambulate. Stated her eyesight was not too good this morning and at EOS returned to EOB with all necessities in reach.  Ferd Hibbs Mobility Specialist

## 2022-01-04 NOTE — TOC Transition Note (Signed)
Transition of Care [facility]) - CM/SW Discharge Note   Patient Details  Name: Kari Coleman MRN: [id number] Date of Birth: 07/05/1950  Transition of Care [facility]) CM/SW Contact:  Dessa Phi, RN Phone Number: 01/04/2022, 12:22 PM   Clinical Narrative:  d/c [address] going to [address][phone],report tel#[phone].PTAR called. No further CM needs.     Final next level of care: [place] Barriers to Discharge: No Barriers Identified   Patient Goals and CMS Choice Patient states their goals for this hospitalization and ongoing recovery are::  (Home) CMS Medicare.gov Compare Post Acute Care list provided to:: Patient Represenative (must comment) (Crystal/Lauren(dtrs)) Choice offered to / list presented to : Adult Children  Discharge Placement PASRR number recieved: 01/02/22            Patient chooses bed at: [facility] Patient to be transferred to facility by:  Corey Harold) Name of family member notified:  (Crystal(dtr)) Patient and family notified of of transfer: 01/04/22  Discharge Plan and Services   Discharge Planning Services: CM Consult Post Acute Care Choice: Home Health                               Social Determinants of Health (SDOH) Interventions     Readmission Risk Interventions     No data to display

## 2022-01-04 NOTE — Discharge Summary (Signed)
[facility]  Physician Discharge Summary   Patient ID: Kari Coleman MRN: [id number] DOB/AGE: 73-24-1952 73 y.o.  Admit date: 12/26/2021 Discharge date:   01/04/2022   PCP: Inda Coke, PA  DISCHARGE DIAGNOSES:    Acute pulmonary embolism ([place])   Acute deep vein thrombosis (DVT) of left popliteal vein (HCC) Essential hypertension   Hypothyroidism   Chronic angle-closure glaucoma of both eyes, severe stage   GERD (gastroesophageal reflux disease)   RECOMMENDATIONS FOR OUTPATIENT FOLLOW UP: Patient on apixaban 10 mg twice a day until 10/3.  On 10/4 dose will need to be decreased to apixaban 5 mg twice a day. Pulmonology to arrange outpatient follow-up Follow up with PCP within 1-2 weeks and repeat CBC, CMP, Mag, Phos within 1 week  ADDENDUM 01/04/22: Patient was medically stable to be discharged yesterday however unfortunately insurance authorization was not obtained but it is now been obtained.  No acute changes overnight.  She remains medically stable to be discharged at this time.  Home Health: SNF Equipment/Devices: None  CODE STATUS: Full code  DISCHARGE CONDITION: fair  Diet recommendation: Heart healthy  INITIAL HISTORY: 71 yo female with pmh HTN, hypothyroid and recent covid infection (august 2023) who presented to ED after 3 days of sob with exertion, progressing to at rest. She states she has not had any new cough, fever, chills, no cp, dizziness, pre-syncopal symptoms. She endorses she has also had decreased appetite since Sunday but no n/v/d. On arrival she was hemodynamically stable except tachycardia to 120s.  Saturating in mid 90s on room air. Found to have bilateral central PEs with considerable clot burden. Admitted with PCCM and started on heparin infusion. Echocardiogram with normal EF, grade 1 diastolic dysfunction and no comment on any right heart strain.  Pressure seems within normal limit. Lower extremity venous Doppler with left popliteal  and peroneal vein DVT. She was placed on Anticoagulation and tolerating it well   Consultants: [place]:   Acute pulmonary embolism Most likely provoked by recent COVID infection and hospitalization.  Was hospitalized at [facility] for encephalopathy back in August. She was seen by pulmonology.  Treated with heparin infusion. Echocardiogram with no concern of right heart strain. Able to wean off from oxygen. SpO2: 97 % O2 Flow Rate (L/min): 2.5 L/min; No longer requiring O2  Switched over to Eliquis.  Pulmonology to arrange outpatient follow-up.  She will need to stop taking aspirin.  She will need 6 months of anticoagulation.  She is tolerating her anticoagulation well.  No evidence for bleeding.  Mild drop in hemoglobin is noted though it is probably dilutional.   Acute deep vein thrombosis (DVT) of left popliteal vein Anticoagulated as mentioned above.   Essential hypertension Patient was on HCTZ lisinopril, amlodipine and metoprolol prior to admission.  Blood pressures here were borderline low.  She remains only on metoprolol at a lower dose than her usual.  Other medications have been discontinued.  Blood pressure is well controlled.  This can be further managed in the outpatient setting.   Chronic kidney disease stage IIIa vs IIIb Renal function close to baseline.   Hypothyroidism Continue home Levothyroxine   Constipation Continue with bowel regimen   Chronic angle-closure glaucoma of both eyes, severe stage Continue home meds   GERD (gastroesophageal reflux disease) Nausea could be due to GERD.  She was given PPI twice a day with improvement in symptoms.  Physical deconditioning Patient was seen by PT and OT.  Thought to be very deconditioned.  Will benefit from short-term rehab at skilled nursing facility.   Obesity -Complicates overall prognosis and care -Estimated body mass index is 33.05 kg/m as calculated from the  following:   Height as of this encounter: '5\' 7"'$  (1.702 m).   Weight as of this encounter: 95.7 kg.  -Weight Loss and Dietary Counseling given   Patient is stable.  Okay for discharge to SNF when bed is available.   PERTINENT LABS:  The results of significant diagnostics from this hospitalization (including imaging, microbiology, ancillary and laboratory) are listed below for reference.    Microbiology: Recent Results (from the past 240 hour(s))  Resp Panel by RT-PCR (Flu A&B, Covid) Anterior Nasal Swab     Status: None   Collection Time: 12/26/21  3:33 PM   Specimen: Anterior Nasal Swab  Result Value Ref Range Status   SARS Coronavirus 2 by RT PCR NEGATIVE NEGATIVE Final    Comment: (NOTE) SARS-CoV-2 target nucleic acids are NOT DETECTED.  The SARS-CoV-2 RNA is generally detectable in upper respiratory specimens during the acute phase of infection. The lowest concentration of SARS-CoV-2 viral copies this assay can detect is 138 copies/mL. A negative result does not preclude SARS-Cov-2 infection and should not be used as the sole basis for treatment or other patient management decisions. A negative result may occur with  improper specimen collection/handling, submission of specimen other than nasopharyngeal swab, presence of viral mutation(s) within the areas targeted by this assay, and inadequate number of viral copies(<138 copies/mL). A negative result must be combined with clinical observations, patient history, and epidemiological information. The expected result is Negative.  Fact Sheet for Patients:  EntrepreneurPulse.com.au  Fact Sheet for Healthcare Providers:  IncredibleEmployment.be  This test is no t yet approved or cleared by the [place] FDA and  has been authorized for detection and/or diagnosis of SARS-CoV-2 by FDA under an Emergency Use Authorization (EUA). This EUA will remain  in effect (meaning this test can be  used) for the duration of the COVID-19 declaration under Section 564(b)(1) of the Act, 21 U.S.C.section 360bbb-3(b)(1), unless the authorization is terminated  or revoked sooner.       Influenza A by PCR NEGATIVE NEGATIVE Final   Influenza B by PCR NEGATIVE NEGATIVE Final    Comment: (NOTE) The Xpert Xpress SARS-CoV-2/FLU/RSV plus assay is intended as an aid in the diagnosis of influenza from Nasopharyngeal swab specimens and should not be used as a sole basis for treatment. Nasal washings and aspirates are unacceptable for Xpert Xpress SARS-CoV-2/FLU/RSV testing.  Fact Sheet for Patients: EntrepreneurPulse.com.au  Fact Sheet for Healthcare Providers: IncredibleEmployment.be  This test is not yet approved or cleared by the [place] FDA and has been authorized for detection and/or diagnosis of SARS-CoV-2 by FDA under an Emergency Use Authorization (EUA). This EUA will remain in effect (meaning this test can be used) for the duration of the COVID-19 declaration under Section 564(b)(1) of the Act, 21 U.S.C. section 360bbb-3(b)(1), unless the authorization is terminated or revoked.  Performed at [facility], [place] [address] [place], [place] [address]   MRSA Next Gen by PCR, Nasal     Status: None   Collection Time: 12/26/21 10:41 PM   Specimen: Nasal Mucosa; Nasal Swab  Result Value Ref Range Status   MRSA by PCR Next Gen NOT DETECTED NOT DETECTED Final    Comment: (NOTE) The GeneXpert MRSA Assay (FDA approved for NASAL specimens only), is one component of a  comprehensive MRSA colonization surveillance program. It is not intended to diagnose MRSA infection nor to guide or monitor treatment for MRSA infections. Test performance is not FDA approved in patients less than 4 years old. Performed at [facility], [place] [address], [place], [place] [address]     Labs:  Basic Metabolic Panel: Recent  Labs  Lab 12/31/21 0556  NA 138  K 3.6  CL 107  CO2 23  GLUCOSE 135*  BUN 27*  CREATININE 1.35*  CALCIUM 9.1      CBC: Recent Labs  Lab 12/29/21 0454 12/31/21 0556  WBC 9.2 9.4  HGB 12.7 11.2*  HCT 38.6 34.5*  MCV 90.8 92.0  PLT 272 270    BNP: BNP (last 3 results) Recent Labs    12/26/21 1533  BNP 70.5    IMAGING STUDIES ECHOCARDIOGRAM COMPLETE  Result Date: 12/27/2021    ECHOCARDIOGRAM REPORT   Patient Name:   Kari Coleman [facility] Date of Exam: 12/27/2021 Medical Rec #:  [id number]     Height:       67.0 in Accession #:    [id number]    Weight:       220.5 lb Date of Birth:  [birth year]     BSA:          2.108 m Patient Age:    6 years      BP:           151/87 mmHg Patient Gender: F             HR:           98 bpm. Exam Location:  Inpatient Procedure: 2D Echo, Color Doppler and Cardiac Doppler Indications:    Abnormal ECG  History:        Patient has prior history of Echocardiogram examinations, most                 recent 11/23/2017. Risk Factors:Hypertension.  Sonographer:    Memory Argue Referring Phys: [id number] [place]  1. Left ventricular ejection fraction, by estimation, is 65 to 70%. The left ventricle has normal function. The left ventricle has no regional wall motion abnormalities. There is mild concentric left ventricular hypertrophy. Left ventricular diastolic parameters are consistent with Grade I diastolic dysfunction (impaired relaxation).  2. Right ventricular systolic function is normal. The right ventricular size is normal. Tricuspid regurgitation signal is inadequate for assessing PA pressure.  3. The mitral valve is grossly normal. No evidence of mitral valve regurgitation. No evidence of mitral stenosis.  4. The aortic valve is tricuspid. There is mild calcification of the aortic valve. Aortic valve regurgitation is not visualized. Aortic valve sclerosis is present, with no evidence of aortic valve stenosis.  5. The inferior vena cava is  normal in size with greater than 50% respiratory variability, suggesting right atrial pressure of 3 mmHg. Comparison(s): No significant change from prior study. FINDINGS  Left Ventricle: Left ventricular ejection fraction, by estimation, is 65 to 70%. The left ventricle has normal function. The left ventricle has no regional wall motion abnormalities. The left ventricular internal cavity size was small. There is mild concentric left ventricular hypertrophy. Left ventricular diastolic parameters are consistent with Grade I diastolic dysfunction (impaired relaxation). Right Ventricle: The right ventricular size is normal. No increase in right ventricular wall thickness. Right ventricular systolic function is normal. Tricuspid regurgitation signal is inadequate for assessing PA pressure. Left Atrium: Left atrial size was normal in size. Right Atrium: Right  atrial size was normal in size. Pericardium: There is no evidence of pericardial effusion. Mitral Valve: The mitral valve is grossly normal. No evidence of mitral valve regurgitation. No evidence of mitral valve stenosis. Tricuspid Valve: The tricuspid valve is normal in structure. Tricuspid valve regurgitation is not demonstrated. No evidence of tricuspid stenosis. Aortic Valve: The aortic valve is tricuspid. There is mild calcification of the aortic valve. Aortic valve regurgitation is not visualized. Aortic valve sclerosis is present, with no evidence of aortic valve stenosis. Aortic valve mean gradient measures 3.0 mmHg. Aortic valve peak gradient measures 6.4 mmHg. Aortic valve area, by VTI measures 1.86 cm. Pulmonic Valve: The pulmonic valve was normal in structure. Pulmonic valve regurgitation is not visualized. No evidence of pulmonic stenosis. Aorta: The aortic root and ascending aorta are structurally normal, with no evidence of dilitation. Venous: The inferior vena cava is normal in size with greater than 50% respiratory variability, suggesting right  atrial pressure of 3 mmHg. IAS/Shunts: No atrial level shunt detected by color flow Doppler.  LEFT VENTRICLE PLAX 2D LVIDd:         3.30 cm   Diastology LVIDs:         2.20 cm   LV e' medial:    4.57 cm/s LV PW:         1.20 cm   LV E/e' medial:  13.7 LV IVS:        1.20 cm   LV e' lateral:   6.31 cm/s LVOT diam:     2.00 cm   LV E/e' lateral: 9.9 LV SV:         35 LV SV Index:   17 LVOT Area:     3.14 cm  RIGHT VENTRICLE RV S prime:     9.57 cm/s TAPSE (M-mode): 1.6 cm LEFT ATRIUM             Index        RIGHT ATRIUM           Index LA diam:        2.40 cm 1.14 cm/m   RA Area:     13.50 cm LA Vol (A2C):   29.8 ml 14.14 ml/m  RA Volume:   33.30 ml  15.80 ml/m LA Vol (A4C):   32.4 ml 15.37 ml/m LA Biplane Vol: 34.7 ml 16.46 ml/m  AORTIC VALVE AV Area (Vmax):    2.05 cm AV Area (Vmean):   2.09 cm AV Area (VTI):     1.86 cm AV Vmax:           126.00 cm/s AV Vmean:          79.000 cm/s AV VTI:            0.191 m AV Peak Grad:      6.4 mmHg AV Mean Grad:      3.0 mmHg LVOT Vmax:         82.20 cm/s LVOT Vmean:        52.600 cm/s LVOT VTI:          0.113 m LVOT/AV VTI ratio: 0.59  AORTA Ao Root diam: 3.10 cm Ao Asc diam:  3.20 cm MITRAL VALVE MV Area (PHT): 4.89 cm     SHUNTS MV Decel Time: 155 msec     Systemic VTI:  0.11 m MV E velocity: 62.60 cm/s   Systemic Diam: 2.00 cm MV A velocity: 107.00 cm/s MV E/A ratio:  0.59 Rudean Haskell MD Electronically signed by Lyda Kalata  Chandrasekhar MD Signature Date/Time: 12/27/2021/2:23:16 PM    Final    VAS [place] LOWER EXTREMITY VENOUS (DVT)  Result Date: 12/27/2021  Lower Venous DVT Study Patient Name:  Kari Coleman [facility]  Date of Exam:   12/27/2021 Medical Rec #: [id number]      Accession #:    [id number] Date of Birth: 1950-10-19      Patient Gender: F Patient Age:   89 years Exam Location:  [facility] Procedure:      VAS [place] LOWER EXTREMITY VENOUS (DVT) Referring Phys: JESSICA MARSHALL  --------------------------------------------------------------------------------  Indications: Pulmonary embolism.  Limitations: Poor ultrasound/tissue interface. Comparison Study: 05/30/2019- negative lower extremity venous duplex Performing Technologist: Maudry Mayhew MHA, RDMS, RVT, RDCS  Examination Guidelines: A complete evaluation includes B-mode imaging, spectral Doppler, color Doppler, and power Doppler as needed of all accessible portions of each vessel. Bilateral testing is considered an integral part of a complete examination. Limited examinations for reoccurring indications may be performed as noted. The reflux portion of the exam is performed with the patient in reverse Trendelenburg.  +---------+---------------+---------+-----------+----------+--------------+ RIGHT    CompressibilityPhasicitySpontaneityPropertiesThrombus Aging +---------+---------------+---------+-----------+----------+--------------+ CFV      Full           Yes      Yes                                 +---------+---------------+---------+-----------+----------+--------------+ SFJ      Full                                                        +---------+---------------+---------+-----------+----------+--------------+ FV Prox  Full                                                        +---------+---------------+---------+-----------+----------+--------------+ FV Mid   Full                                                        +---------+---------------+---------+-----------+----------+--------------+ FV DistalFull                                                        +---------+---------------+---------+-----------+----------+--------------+ PFV      Full                                                        +---------+---------------+---------+-----------+----------+--------------+ POP      Full           Yes      Yes                                  +---------+---------------+---------+-----------+----------+--------------+  PTV      Full                                                        +---------+---------------+---------+-----------+----------+--------------+ PERO     Full                                                        +---------+---------------+---------+-----------+----------+--------------+   +---------+---------------+---------+-----------+----------+--------------+ LEFT     CompressibilityPhasicitySpontaneityPropertiesThrombus Aging +---------+---------------+---------+-----------+----------+--------------+ CFV      Full           Yes      Yes                                 +---------+---------------+---------+-----------+----------+--------------+ SFJ      Full                                                        +---------+---------------+---------+-----------+----------+--------------+ FV Prox  Full                                                        +---------+---------------+---------+-----------+----------+--------------+ FV Mid   Full                                                        +---------+---------------+---------+-----------+----------+--------------+ FV DistalFull                                                        +---------+---------------+---------+-----------+----------+--------------+ PFV      Full                                                        +---------+---------------+---------+-----------+----------+--------------+ POP      None                    No                   Acute          +---------+---------------+---------+-----------+----------+--------------+ PTV      Full                    Yes                                 +---------+---------------+---------+-----------+----------+--------------+  PERO     None                    No                   Acute           +---------+---------------+---------+-----------+----------+--------------+     Summary: RIGHT: - There is no evidence of deep vein thrombosis in the lower extremity.  - No cystic structure found in the popliteal fossa.  LEFT: - Findings consistent with acute deep vein thrombosis involving the left popliteal vein, and left peroneal veins. - No cystic structure found in the popliteal fossa.  *See table(s) above for measurements and observations. Electronically signed by Deitra Mayo MD on 12/27/2021 at 1:09:12 PM.    Final    CT Angio Chest PE W and/or Wo Contrast  Result Date: 12/26/2021 CLINICAL DATA:  Evaluate for acute pulmonary embolus. Shortness of breath and hypoxia EXAM: CT ANGIOGRAPHY CHEST WITH CONTRAST TECHNIQUE: Multidetector CT imaging of the chest was performed using the standard protocol during bolus administration of intravenous contrast. Multiplanar CT image reconstructions and MIPs were obtained to evaluate the vascular anatomy. RADIATION DOSE REDUCTION: This exam was performed according to the departmental dose-optimization program which includes automated exposure control, adjustment of the mA and/or kV according to patient size and/or use of iterative reconstruction technique. CONTRAST:  34m OMNIPAQUE IOHEXOL 350 MG/ML SOLN COMPARISON:  Coronary calcium score 08/26/2021 FINDINGS: Cardiovascular: Examination is positive for large, bilateral central obstructing pulmonary artery filling defects compatible with at least submassive pulmonary embolus. This includes a large thrombus within the left main pulmonary artery, image 56/4 and large volume clot which extends into the right lower lobe and right middle lobe pulmonary arteries,. The RV to LV ratio is calculated at 1.5 compatible with right heart strain. Heart size is upper limits of normal. Aortic atherosclerosis. No pericardial effusion. Mediastinum/Nodes: No enlarged mediastinal, hilar, or axillary lymph nodes. Thyroid gland, trachea,  and esophagus demonstrate no significant findings. Lungs/Pleura: Pulmonary parenchymal detail is diminished secondary to motion artifact. No signs of pleural effusion, airspace consolidation, or pneumothorax. Mild changes of centrilobular emphysema. Upper Abdomen: No acute abnormality.  Subjective hepatic steatosis. Musculoskeletal: No acute or suspicious osseous findings. Thoracic degenerative disc disease noted. No chest wall mass identified. Review of the MIP images confirms the above findings. IMPRESSION: 1. Examination is positive for acute PE with CT evidence of right heart strain (RV/LV Ratio = 1.5.) consistent with at least submassive (intermediate risk) PE. The presence of right heart strain has been associated with an increased risk of morbidity and mortality. Please refer to the "Code PE Focused" order set in EPIC. 2. Subjective hepatic steatosis. 3. Aortic Atherosclerosis (ICD10-I70.0) and Emphysema (ICD10-J43.9). Critical Value/emergent results were called by telephone at the time of interpretation on 12/26/2021 at 6:20 pm to provider [facility], who verbally acknowledged these results. Electronically Signed   By: TKerby MoorsM.D.   On: 12/26/2021 18:20    DISCHARGE EXAMINATION: Vitals:   01/03/22 1944 01/04/22 0500 01/04/22 0617 01/04/22 0840  BP: (!) 146/95  137/85   Pulse: 76  71   Resp: 20  (!) 22 16  Temp: 98.7 F (37.1 C)  97.7 F (36.5 C)   TempSrc: Oral  Oral   SpO2: 95%  97% 97%  Weight:  95.7 kg    Height:       General appearance: Awake alert.  In no distress Resp: Clear to auscultation bilaterally.  Normal effort Cardio: S1-S2 is normal regular.  No S3-S4.  No rubs murmurs or bruit GI: Abdomen is soft.  Nontender nondistended.  Bowel sounds are present normal.  No masses organomegaly   DISPOSITION: SNF  Discharge Instructions     Call MD for:  difficulty breathing, headache or visual disturbances   Complete by: As directed    Call MD for:  extreme fatigue    Complete by: As directed    Call MD for:  persistant dizziness or light-headedness   Complete by: As directed    Call MD for:  persistant nausea and vomiting   Complete by: As directed    Call MD for:  severe uncontrolled pain   Complete by: As directed    Call MD for:  temperature >100.4   Complete by: As directed    Diet - low sodium heart healthy   Complete by: As directed    Discharge instructions   Complete by: As directed    Please take your medications as prescribed.  You will hear from the lung doctors office to set up an appointment for follow-up.  Please follow-up with your primary care provider in 1 week.  Watch for signs of bleeding as has been explained to you.  You were cared for by a hospitalist during your hospital stay. If you have any questions about your discharge medications or the care you received while you were in the hospital after you are discharged, you can call the unit and asked to speak with the hospitalist on call if the hospitalist that took care of you is not available. Once you are discharged, your primary care physician will handle any further medical issues. Please note that NO REFILLS for any discharge medications will be authorized once you are discharged, as it is imperative that you return to your primary care physician (or establish a relationship with a primary care physician if you do not have one) for your aftercare needs so that they can reassess your need for medications and monitor your lab values. If you do not have a primary care physician, you can call [phone] for a physician referral.   Increase activity slowly   Complete by: As directed           Allergies as of 01/04/2022       Reactions   Doxycycline Nausea Only   Codeine Nausea And Vomiting   Pollen Extract Other (See Comments)   Unknown reaction         Medication List     STOP taking these medications    amLODipine 10 MG tablet Commonly known as: NORVASC   aspirin EC  81 MG tablet   clindamycin 150 MG capsule Commonly known as: CLEOCIN   hydrochlorothiazide 25 MG tablet Commonly known as: HYDRODIURIL   lisinopril 40 MG tablet Commonly known as: ZESTRIL   metoprolol succinate 100 MG 24 hr tablet Commonly known as: TOPROL-XL       TAKE these medications    acetaminophen 325 MG tablet Commonly known as: TYLENOL Take 650 mg by mouth every 4 (four) hours as needed for mild pain or moderate pain.   albuterol 108 (90 Base) MCG/ACT inhaler Commonly known as: Proventil HFA Inhale 2 puffs into the lungs every 6 (six) hours as needed for wheezing or shortness of breath.   albuterol (2.5 MG/3ML) 0.083% nebulizer solution Commonly known as: PROVENTIL Take 3 mLs (2.5 mg total) by nebulization every 6 (six) hours as needed for wheezing or shortness  of breath.   apixaban 5 MG Tabs tablet Commonly known as: ELIQUIS Take 1 tablet (5 mg total) by mouth 2 (two) times daily. Take 2 tablets on 01/03/22 at 9pm and then start 1 tablet twice daily from 01/04/22   budesonide-formoterol 80-4.5 MCG/ACT inhaler Commonly known as: SYMBICORT Inhale 2 puffs into the lungs 2 (two) times daily.   dorzolamide-timolol 22.3-6.8 MG/ML ophthalmic solution Commonly known as: COSOPT Place 1 drop into the right eye 2 (two) times daily.   latanoprost 0.005 % ophthalmic solution Commonly known as: XALATAN Place 1 drop into both eyes at bedtime. As directed   levothyroxine 175 MCG tablet Commonly known as: SYNTHROID Take 1 tablet (175 mcg total) by mouth daily.   metoprolol tartrate 25 MG tablet Commonly known as: LOPRESSOR Take 1 tablet (25 mg total) by mouth 2 (two) times daily.   pantoprazole 40 MG tablet Commonly known as: PROTONIX Take 1 tablet (40 mg total) by mouth 2 (two) times daily before a meal. What changed:  medication strength how much to take when to take this   polyethylene glycol 17 g packet Commonly known as: MIRALAX / GLYCOLAX Take 17 g by  mouth daily.   pravastatin 10 MG tablet Commonly known as: PRAVACHOL Take 1 tablet (10 mg total) by mouth daily.   prednisoLONE acetate 1 % ophthalmic suspension Commonly known as: PRED FORTE Place 1 drop into both eyes every 4 (four) hours. What changed: when to take this   senna-docusate 8.6-50 MG tablet Commonly known as: Senokot-S Take 2 tablets by mouth 2 (two) times daily.   Vitamin D-3 25 MCG (1000 UT) Caps Take 2,000 Units by mouth daily.          Contact information for follow-up providers     Inda Coke, [place]. Schedule an appointment as soon as possible for a visit in 1 week(s).   Specialty: Physician Assistant Why: post hospitalization follow up Contact information: [place] [address] [phone]              Contact information for after-discharge care     Destination     [facility] .   Service: Skilled Nursing Contact information: [place]                    TOTAL DISCHARGE TIME: 50 minutes  Raiford Noble, DO [facility] Pager on [email or web address]  01/04/2022, 11:29 AM

## 2022-01-19 ENCOUNTER — Telehealth: Payer: Self-pay | Admitting: Physician Assistant

## 2022-01-19 NOTE — Telephone Encounter (Signed)
Patient states she was prescribed apixaban (ELIQUIS) 5 MG TABS tablet  while being hospitalized at [facility].  Patient states a RX for the medication is over $400, however, Patient was told at the hospital that her PCP could prescribe the medication with a discount card and the cost would be $ 47.00.  Patient  requests to be called at ph#  [phone] or ph# [phone] to verify above information Patient was told is correct.

## 2022-01-20 ENCOUNTER — Telehealth: Payer: Self-pay | Admitting: Pulmonary Disease

## 2022-01-20 NOTE — Telephone Encounter (Signed)
Called patient and she states that she is unable to afford the medication she was place don at hospital. She states it will cost her a lot of money and she cant afford it. She has HFU on 11/1 with BQ. She states she was told about a coupon for her medication  Please advise sir   Medication is Eliquis

## 2022-01-20 NOTE — Telephone Encounter (Signed)
Pt called back, told her I do not know why the hospital would tell you that. We do not have any coupons. I would suggest you go on Good Rx to see how much it is at different pharmacy's and if you can not afford it you will need to contact Cardiology since they will be following you for this. Pt verbalized understanding.

## 2022-01-20 NOTE — Telephone Encounter (Signed)
Patient returned call. Requests to be called. 

## 2022-01-20 NOTE — Telephone Encounter (Signed)
Spoke to pt and got cuff off, tried to call pt back no answer. Unable to leave message voicemail is full. Will try again later.

## 2022-01-20 NOTE — Telephone Encounter (Signed)
Called pt back, she said she called office that she got letter from and they were going to speak to doctor and call her back about her Eliquis. Told her okay, hopefully they will call you back. I see you have an appointment on Monday with [facility] we can discuss it then if not resolved. Pt verbalized understanding.

## 2022-01-23 ENCOUNTER — Other Ambulatory Visit (HOSPITAL_COMMUNITY): Payer: Self-pay

## 2022-01-23 ENCOUNTER — Ambulatory Visit (INDEPENDENT_AMBULATORY_CARE_PROVIDER_SITE_OTHER): Payer: Medicare Other | Admitting: Physician Assistant

## 2022-01-23 ENCOUNTER — Encounter: Payer: Self-pay | Admitting: Physician Assistant

## 2022-01-23 VITALS — BP 150/98 | HR 77 | Temp 98.0°F | Ht 67.0 in | Wt 211.4 lb

## 2022-01-23 DIAGNOSIS — I10 Essential (primary) hypertension: Secondary | ICD-10-CM | POA: Diagnosis not present

## 2022-01-23 DIAGNOSIS — Z23 Encounter for immunization: Secondary | ICD-10-CM

## 2022-01-23 DIAGNOSIS — I2699 Other pulmonary embolism without acute cor pulmonale: Secondary | ICD-10-CM

## 2022-01-23 NOTE — Telephone Encounter (Addendum)
Patient does not qualify for copay card because she has Medicare. She will need to apply for manufacturer's patient assistance program.  FlyerFunds.com.br.[email or web address]  In addition to signing her portion of application, she will need to include:  Proof of household income (such as federal tax return, Science writer) Proof of out-of-pocket prescription expenses for the household (such as a Careers adviser)  I called patient and she will collect and bring documents to clinic in next few days. Patient forms placed upfront for pt to sign. Provider form placed in Dr. Anastasia Pall mailbox  Triage to f/u  Knox Saliva, PharmD, MPH, BCPS, CPP Clinical Pharmacist (Rheumatology and Pulmonology)

## 2022-01-23 NOTE — Telephone Encounter (Signed)
Hey is there anyway that we can help this patient and the cost of her Eliquis.   Dr Pennie Banter asked me to reach out to our pharmacy  Please advise

## 2022-01-23 NOTE — Telephone Encounter (Signed)
Sent provider form via email to Dr. Lake Bells.

## 2022-01-23 NOTE — Telephone Encounter (Signed)
Provider portion signed by Dr. Lake Bells. Gave form to [place] while we wait for pt to stop by to sign her portion of application and drop off relevant income documents  Per review of appt from today with PCP, patient [facility]. Has appt with this clinic on 01/25/22  Knox Saliva, PharmD, MPH, BCPS, CPP Clinical Pharmacist (Rheumatology and Pulmonology)

## 2022-01-23 NOTE — Patient Instructions (Signed)
It was great to see you!  Make an appointment with Lorenda Peck for COUMADIN Clinic this [facility]  Take your blood pressure medications and blood thinners as needed  Follow-up with me in two weeks  Take care,  Inda Coke PA-C

## 2022-01-23 NOTE — Progress Notes (Signed)
Kari Coleman is a 71 y.o. female here for a follow up of a pre-existing problem.  History of Present Illness:   Chief Complaint  Patient presents with   Hospitalization Follow-up    Pt was in the hospital 12/26/21 discharge on 01/04/2022.    HPI  Pulmonary embolus Patient was admitted to [facility] in [place] on 11/17/2021 for Covid-19, generalized weakness, cough , SOB, and acute metabolic encephalopathy. She was hospitalized for 9 days. On 12/26/2021 she went to the ER for severe SOB.  CTA showed the following on 9/25: 1. Examination is positive for acute PE with CT evidence of right heart strain (RV/LV Ratio = 1.5.) consistent with at least submassive (intermediate risk) PE. The presence of right heart strain has been associated with an increased risk of morbidity and mortality. Please refer to the "Code PE Focused" order set in EPIC. 2. Subjective hepatic steatosis. 3. Aortic Atherosclerosis (ICD10-I70.0) and Emphysema (ICD10-J43.9).  She had an echo on 12/27/2021 that showed left ventricular hyper per trophy, grade 1 diastolic dysfunction, mild calcification of the aortic valve and aortic valve sclerosis.  Venous Doppler on 12/27/2028 showed acute DVT in left popliteal vein and left peroneal vein.  She was started on heparin and then transition to Eliquis.  She is taking Eliquis as prescribed but this medication is over $400.  It was suspected that her blood clots were secondary to her recent hospitalization and COVID infection.   Hypertension Prior to her hospitalization she was on lisinopril 40 mg daily, 25 mg hydrochlorothiazide, 10 mg amlodipine and 100 mg metoprolol.  Her blood pressures were borderline low during the hospitalization so she was discharged only on metoprolol.  She is currently taking metoprolol 25 mg twice daily.  Although not discharged on amlodipine 2.5 mg, she states that she is taking this daily.  She states that her daughter manages her  medications.  Her daughter is not here today.  She denies shortness of breath, chest pain.  Patients blood pressure is high this visit and she reports to not regularly check blood pressure at home. BP Readings from Last 3 Encounters:  01/23/22 (!) 150/98  01/04/22 (!) 143/85  12/04/21 (!) 125/90      Past Medical History:  Diagnosis Date   Allergy    Arthritis    Cataract    Cervical cancer (HCC)    GERD (gastroesophageal reflux disease)    Glaucoma 2017   History of echocardiogram    Echo 9/16:  Mild LVH, EF 55-60%, no RWMA, Gr 1 DD, mild MR // Echo 1/17:  Mild LVH, EF 50-55%, Gr 1 DD, mild to mod MR, mild LAE   Hypertension    Hyperthyroidism    s/p RAI treatment   Palpitations    PONV (postoperative nausea and vomiting)    years ago  . Woke up and saw asian people    PONV (postoperative nausea and vomiting) 07/18/2021   Retinal detachment    L eye - partial blindness   Tobacco abuse    Tuberculosis    positive test as a caregiver cxr annually    Urinary incontinence      Social History   Tobacco Use   Smoking status: Former    Packs/day: 0.50    Years: 48.00    Total pack years: 24.00    Types: Cigarettes    Start date: 11/30/1968    Quit date: 10/31/2016    Years since quitting: 5.2   Smokeless tobacco: Never  Tobacco comments:    did not smoke more than .5 pack   Vaping Use   Vaping Use: Never used  Substance Use Topics   Alcohol use: Not Currently    Alcohol/week: 0.0 standard drinks of alcohol    Comment: rarely once a year   Drug use: No    Past Surgical History:  Procedure Laterality Date   ABDOMINAL HYSTERECTOMY  1984   bladder surgey for incontinence     EYE SURGERY     REPAIR OF COMPLEX TRACTION RETINAL DETACHMENT     thyroid radiation     TOTAL ABDOMINAL HYSTERECTOMY W/ BILATERAL SALPINGOOPHORECTOMY     TOTAL KNEE ARTHROPLASTY Right 04/22/2019   Procedure: RIGHT TOTAL KNEE ARTHROPLASTY;  Surgeon: Melrose Nakayama, MD;  Location: [facility];   Service: Orthopedics;  Laterality: Right;  PENDING PRE-OP FOR SDDC APPROVAL    Family History  Problem Relation Age of Onset   Hypertension Mother    Heart failure Mother    Diabetes Mother    Pancreatic cancer Father    Colon cancer Maternal Aunt    Cancer Sister    Thyroid disease Paternal Aunt    Cancer Paternal Aunt    Thyroid disease Other        paternal side   Cancer Maternal Grandfather    Diabetes Paternal Grandmother    Diabetes Maternal Uncle    Colon polyps Neg Hx    Stomach cancer Neg Hx     Allergies  Allergen Reactions   Doxycycline Nausea Only   Codeine Nausea And Vomiting   Pollen Extract Other (See Comments)    Unknown reaction     Current Medications:   Current Outpatient Medications:    acetaminophen (TYLENOL) 325 MG tablet, Take 650 mg by mouth every 4 (four) hours as needed for mild pain or moderate pain., Disp: , Rfl:    albuterol (PROVENTIL HFA) 108 (90 Base) MCG/ACT inhaler, Inhale 2 puffs into the lungs every 6 (six) hours as needed for wheezing or shortness of breath., Disp: 18 g, Rfl: 2   albuterol (PROVENTIL) (2.5 MG/3ML) 0.083% nebulizer solution, Take 3 mLs (2.5 mg total) by nebulization every 6 (six) hours as needed for wheezing or shortness of breath., Disp: 150 mL, Rfl: 1   amLODipine (NORVASC) 2.5 MG tablet, Take 2.5 mg by mouth daily., Disp: , Rfl:    apixaban (ELIQUIS) 5 MG TABS tablet, Take 1 tablet (5 mg total) by mouth 2 (two) times daily. Take 2 tablets on 01/03/22 at 9pm and then start 1 tablet twice daily from 01/04/22, Disp: 60 tablet, Rfl: 2   budesonide-formoterol (SYMBICORT) 80-4.5 MCG/ACT inhaler, Inhale 2 puffs into the lungs 2 (two) times daily., Disp: 1 each, Rfl: 3   Cholecalciferol (VITAMIN D-3) 25 MCG (1000 UT) CAPS, Take 2,000 Units by mouth daily., Disp: , Rfl:    dorzolamide-timolol (COSOPT) 22.3-6.8 MG/ML ophthalmic solution, Place 1 drop into the right eye 2 (two) times daily., Disp: , Rfl:    latanoprost (XALATAN)  0.005 % ophthalmic solution, Place 1 drop into both eyes at bedtime. As directed, Disp: , Rfl:    levothyroxine (SYNTHROID) 150 MCG tablet, Take 150 mcg by mouth daily., Disp: , Rfl:    metoprolol tartrate (LOPRESSOR) 25 MG tablet, Take 1 tablet (25 mg total) by mouth 2 (two) times daily., Disp: 60 tablet, Rfl: 2   pantoprazole (PROTONIX) 40 MG tablet, Take 1 tablet (40 mg total) by mouth 2 (two) times daily before a meal., Disp: 60 tablet,  Rfl: 1   polyethylene glycol (MIRALAX / GLYCOLAX) 17 g packet, Take 17 g by mouth daily., Disp: 14 each, Rfl: 0   pravastatin (PRAVACHOL) 10 MG tablet, Take 1 tablet (10 mg total) by mouth daily., Disp: 90 tablet, Rfl: 1   prednisoLONE acetate (PRED FORTE) 1 % ophthalmic suspension, Place 1 drop into both eyes every 4 (four) hours., Disp: 5 mL, Rfl: 0   senna-docusate (SENOKOT-S) 8.6-50 MG tablet, Take 2 tablets by mouth 2 (two) times daily., Disp: 60 tablet, Rfl: 0   Review of Systems:   ROS Negative unless otherwise specified per HPI.   Vitals:   Vitals:   01/23/22 1116 01/23/22 1150  BP: (!) 156/84 (!) 150/98  Pulse: 77 77  Temp: 98 F (36.7 C)   TempSrc: Temporal   SpO2: 96%   Weight: 211 lb 6.2 oz (95.9 kg)   Height: '5\' 7"'$  (1.702 m)      Body mass index is 33.11 kg/m.  Physical Exam:   Physical Exam Constitutional:      General: She is not in acute distress.    Appearance: Normal appearance. She is not ill-appearing.  HENT:     Head: Normocephalic and atraumatic.     Right Ear: External ear normal.     Left Ear: External ear normal.  Eyes:     Extraocular Movements: Extraocular movements intact.     Pupils: Pupils are equal, round, and reactive to light.  Cardiovascular:     Rate and Rhythm: Normal rate and regular rhythm.     Heart sounds: Normal heart sounds. No murmur heard.    No gallop.  Pulmonary:     Effort: Pulmonary effort is normal. No respiratory distress.     Breath sounds: Normal breath sounds. No wheezing or  rales.  Skin:    General: Skin is warm and dry.  Neurological:     Mental Status: She is alert and oriented to person, place, and time.  Psychiatric:        Judgment: Judgment normal.     Assessment and Plan:   Primary hypertension Above goal No evidence of endorgan damage on my exam or her history She reports that she is not checking her blood pressures at home and that blood pressures were on the low end at the hospital which is why her medication was discontinued Continue metoprolol 25 mg twice daily and amlodipine 2.5 mg daily Check blood pressures daily and follow-up with me in office in 2 weeks to review this Low threshold to ramp up amlodipine to goal and add back her lisinopril and hydrochlorothiazide if needed  Acute pulmonary embolism without acute cor pulmonale, unspecified pulmonary embolism type ([place]) Due to inability to afford Eliquis, will go ahead and have patient set up with Coumadin clinic in 2 days.  She reports that she has enough medication to get her to this Wednesday appointment. Discussed the importance of compliance with Eliquis and coumadin. She is seeing pulmonary in 2 weeks for follow-up of this.  I,Verona Buck,acting as a Education administrator for [employer], PA.,have documented all relevant documentation on the behalf of Inda Coke, PA,as directed by  Inda Coke, PA while in the presence of Inda Coke, [place].  I, Inda Coke, [place], have reviewed all documentation for this visit. The documentation on 01/23/22 for the exam, diagnosis, procedures, and orders are all accurate and complete.  Time spent with patient today was 57 minutes which consisted of chart review, discussing diagnosis, work up, treatment answering questions  and documentation.   Inda Coke, PA-C

## 2022-01-23 NOTE — Telephone Encounter (Signed)
Routing to [place] for Follow up  and Dr. Lake Bells as Juluis Rainier

## 2022-01-24 ENCOUNTER — Other Ambulatory Visit (INDEPENDENT_AMBULATORY_CARE_PROVIDER_SITE_OTHER): Payer: Medicare Other

## 2022-01-24 DIAGNOSIS — I10 Essential (primary) hypertension: Secondary | ICD-10-CM

## 2022-01-24 LAB — COMPREHENSIVE METABOLIC PANEL
ALT: 17 U/L (ref 0–35)
AST: 14 U/L (ref 0–37)
Albumin: 4.1 g/dL (ref 3.5–5.2)
Alkaline Phosphatase: 71 U/L (ref 39–117)
BUN: 13 mg/dL (ref 6–23)
CO2: 26 mEq/L (ref 19–32)
Calcium: 10.1 mg/dL (ref 8.4–10.5)
Chloride: 102 mEq/L (ref 96–112)
Creatinine, Ser: 0.96 mg/dL (ref 0.40–1.20)
GFR: 58.76 mL/min — ABNORMAL LOW (ref >60.00)
Glucose, Bld: 140 mg/dL — ABNORMAL HIGH (ref 70–99)
Potassium: 3.3 mEq/L — ABNORMAL LOW (ref 3.5–5.1)
Sodium: 137 mEq/L (ref 135–145)
Total Bilirubin: 0.7 mg/dL (ref 0.2–1.2)
Total Protein: 7.8 g/dL (ref 6.0–8.3)

## 2022-01-24 LAB — CBC WITH DIFFERENTIAL/PLATELET
Basophils Absolute: 0 10*3/uL (ref 0.0–0.1)
Basophils Relative: 0.8 % (ref 0.0–3.0)
Eosinophils Absolute: 0.2 10*3/uL (ref 0.0–0.7)
Eosinophils Relative: 3.4 % (ref 0.0–5.0)
HCT: 37.2 % (ref 36.0–46.0)
Hemoglobin: 12.5 g/dL (ref 12.0–15.0)
Lymphocytes Relative: 33.5 % (ref 12.0–46.0)
Lymphs Abs: 2 10*3/uL (ref 0.7–4.0)
MCHC: 33.7 g/dL (ref 30.0–36.0)
MCV: 87.6 fl (ref 78.0–100.0)
Monocytes Absolute: 0.4 10*3/uL (ref 0.1–1.0)
Monocytes Relative: 6.6 % (ref 3.0–12.0)
Neutro Abs: 3.3 10*3/uL (ref 1.4–7.7)
Neutrophils Relative %: 55.7 % (ref 43.0–77.0)
Platelets: 253 10*3/uL (ref 150.0–400.0)
RBC: 4.24 Mil/uL (ref 3.87–5.11)
RDW: 13.3 % (ref 11.5–15.5)
WBC: 6 10*3/uL (ref 4.0–10.5)

## 2022-01-25 ENCOUNTER — Ambulatory Visit (INDEPENDENT_AMBULATORY_CARE_PROVIDER_SITE_OTHER): Payer: Medicare Other

## 2022-01-25 DIAGNOSIS — Z7901 Long term (current) use of anticoagulants: Secondary | ICD-10-CM | POA: Diagnosis not present

## 2022-01-25 LAB — POCT INR: INR: 1.3 — AB (ref 2.0–3.0)

## 2022-01-25 MED ORDER — WARFARIN SODIUM 5 MG PO TABS: 5.0000 mg | ORAL_TABLET | Freq: Every day | ORAL | 1 refills | Status: DC

## 2022-01-25 NOTE — Patient Instructions (Signed)
Start warfarin 5 mg by mouth daily today. It is best to take it in the evenings. The prescription has been sent to your pharmacy ([facility]).  Continue taking Eliquis twice a day until you run out. It is ok to take it at the same time as warfarin.  Repeat INR check on 02/01/2022 at [facility] at 2:30.  If you can get there before 2:30 I'll be happy to see you then.  The address is [address], [place], [place] [address].  Please review all the educational materials provided to you today.  Call me at [phone] if you ever need help or have any questions about anything.

## 2022-01-25 NOTE — Progress Notes (Signed)
Patient was admitted to [facility] in [place] on 11/17/2021 for COVID-19, generalized weakness, cough, SOB, and acute metabolic encephalopathy. She was hospitalized for 9 days. On 12/26/2021 she went to the ER for severe SOB and was diagnosed with PE.  Venous doppler on 9/26 showed acute DVT in left popliteal vein and left peroneal vein.  It was suspected that her blood clots were secondary to her recent hospitalization and COVID infection.  She was started on heparin and then transitioned to Eliquis. She is taking Eliquis as prescribed (5 mg po twice daily) but this medication is cost prohibitive. Here today to establish with anticoagulation clinic per Inda Coke, PA-C.   Start warfarin '5mg'$  daily today and check INR once a week until two consecutive INR readings are in range (2.0 - 3.0).  Pt states she has enough Eliquis to last until the weekend. Advised pt to keep taking it with warfarin until she runs out.   Pt educated on s/s of DVT, PE, stoke, and excessive clotting. Also educated on s/s of excessive bleeding. Pt instructed to go to ED immediately if experience any of those s/s.    Pt provided education on dietary restrictions with warfarin. Pt given 'Vitamin K Finder' booklet that defines foods with high, medium, and low Vitamin K content. Pt provided education on medication reactions with warfarin.  Pt given handout that identifies OTC medications that are acceptable for use and those to use with caution.  Pt also given an anticoagulation Zone Tool.  This tool defines when to reach out to provider and when to seek help immediately.

## 2022-01-31 NOTE — Progress Notes (Unsigned)
Synopsis: First seen in September 2023 when hospitalized for a provoked pulmonary embolism after having COVID.  Treated with conservative management. Smoked 1/2 ppd for 48 years, quit in 2018.    Subjective:   PATIENT ID: Kari Coleman GENDER: female DOB: 09/27/1950, MRN: [id number]   HPI  Chief Complaint  Patient presents with   Follow-up   Brette says taht she is doing OK, most days she feels OK.  However recently she caught a cold. She now has sinus congestion, coughing up a little mucus, the cough at night is severe.    She says taht her breathing has been doing OK since she left the hospital.  In general she has trouble breathing, typically it's worse in the evenings.  Sometimes it can happen even when she is at rest.  It's better than it was when she was in the hospital.    She has pain and discomfort in her leg.    She hasn't had any bleeding since taking the warfarin.  She is switching to warfarin from eliquis.    She uses symbicort.     Past Medical History:  Diagnosis Date   Allergy    Arthritis    Cataract    Cervical cancer ([place])    GERD (gastroesophageal reflux disease)    Glaucoma 2017   History of echocardiogram    Echo 9/16:  Mild LVH, EF 55-60%, no RWMA, Gr 1 DD, mild MR // Echo 1/17:  Mild LVH, EF 50-55%, Gr 1 DD, mild to mod MR, mild LAE   Hypertension    Hyperthyroidism    s/p RAI treatment   Palpitations    PONV (postoperative nausea and vomiting)    years ago  . Woke up and saw asian people    PONV (postoperative nausea and vomiting) 07/18/2021   Retinal detachment    L eye - partial blindness   Tobacco abuse    Tuberculosis    positive test as a caregiver cxr annually    Urinary incontinence      Family History  Problem Relation Age of Onset   Hypertension Mother    Heart failure Mother    Diabetes Mother    Pancreatic cancer Father    Colon cancer Maternal Aunt    Cancer Sister    Thyroid disease Paternal Aunt    Cancer Paternal Aunt     Thyroid disease Other        paternal side   Cancer Maternal Grandfather    Diabetes Paternal Grandmother    Diabetes Maternal Uncle    Colon polyps Neg Hx    Stomach cancer Neg Hx      Social History   Socioeconomic History   Marital status: Single    Spouse name: Not on file   Number of children: 2   Years of education: Not on file   Highest education level: Not on file  Occupational History   Occupation: CNA    Comment: [employer]   Occupation: Retired  Tobacco Use   Smoking status: Former    Packs/day: 0.50    Years: 48.00    Total pack years: 24.00    Types: Cigarettes    Start date: 11/30/1968    Quit date: 10/31/2016    Years since quitting: 5.2   Smokeless tobacco: Never   Tobacco comments:    did not smoke more than .5 pack   Vaping Use   Vaping Use: Never used  Substance and Sexual Activity  Alcohol use: Not Currently    Alcohol/week: 0.0 standard drinks of alcohol    Comment: rarely once a year   Drug use: No   Sexual activity: Not Currently  Other Topics Concern   Not on file  Social History Narrative   Caregiver   Daughter and grandson in household   Social Determinants of Health   Financial Resource Strain: Low Risk  (12/12/2021)   Overall Financial Resource Strain (CARDIA)    Difficulty of Paying Living Expenses: Not hard at all  Food Insecurity: No Food Insecurity (12/26/2021)   Hunger Vital Sign    Worried About Running Out of Food in the Last Year: Never true    Ran Out of Food in the Last Year: Never true  Transportation Needs: No Transportation Needs (12/26/2021)   PRAPARE - Hydrologist (Medical): No    Lack of Transportation (Non-Medical): No  Physical Activity: Inactive (12/12/2021)   Exercise Vital Sign    Days of Exercise per Week: 0 days    Minutes of Exercise per Session: 0 min  Stress: No Stress Concern Present (12/12/2021)   [place]    Feeling of Stress : Not at all  Social Connections: Moderately Isolated (12/12/2021)   Social Connection and Isolation Panel [NHANES]    Frequency of Communication with Friends and Family: More than three times a week    Frequency of Social Gatherings with Friends and Family: Twice a week    Attends Religious Services: 1 to 4 times per year    Active Member of [employer] or Organizations: No    Attends Archivist Meetings: Never    Marital Status: Never married  Intimate Partner Violence: Not At Risk (12/26/2021)   Humiliation, Afraid, Rape, and Kick questionnaire    Fear of Current or Ex-Partner: No    Emotionally Abused: No    Physically Abused: No    Sexually Abused: No     Allergies  Allergen Reactions   Doxycycline Nausea Only   Codeine Nausea And Vomiting   Pollen Extract Other (See Comments)    Unknown reaction      Outpatient Medications Prior to Visit  Medication Sig Dispense Refill   acetaminophen (TYLENOL) 325 MG tablet Take 650 mg by mouth every 4 (four) hours as needed for mild pain or moderate pain.     albuterol (PROVENTIL HFA) 108 (90 Base) MCG/ACT inhaler Inhale 2 puffs into the lungs every 6 (six) hours as needed for wheezing or shortness of breath. 18 g 2   albuterol (PROVENTIL) (2.5 MG/3ML) 0.083% nebulizer solution Take 3 mLs (2.5 mg total) by nebulization every 6 (six) hours as needed for wheezing or shortness of breath. 150 mL 1   amLODipine (NORVASC) 2.5 MG tablet Take 2.5 mg by mouth daily.     apixaban (ELIQUIS) 5 MG TABS tablet Take 1 tablet (5 mg total) by mouth 2 (two) times daily. Take 2 tablets on 01/03/22 at 9pm and then start 1 tablet twice daily from 01/04/22 60 tablet 2   budesonide-formoterol (SYMBICORT) 80-4.5 MCG/ACT inhaler Inhale 2 puffs into the lungs 2 (two) times daily. 1 each 3   Cholecalciferol (VITAMIN D-3) 25 MCG (1000 UT) CAPS Take 2,000 Units by mouth daily.     dorzolamide-timolol (COSOPT) 22.3-6.8 MG/ML ophthalmic  solution Place 1 drop into the right eye 2 (two) times daily.     latanoprost (XALATAN) 0.005 % ophthalmic solution Place 1  drop into both eyes at bedtime. As directed     levothyroxine (SYNTHROID) 150 MCG tablet Take 150 mcg by mouth daily.     metoprolol tartrate (LOPRESSOR) 25 MG tablet Take 1 tablet (25 mg total) by mouth 2 (two) times daily. 60 tablet 2   pantoprazole (PROTONIX) 40 MG tablet Take 1 tablet (40 mg total) by mouth 2 (two) times daily before a meal. 60 tablet 1   polyethylene glycol (MIRALAX / GLYCOLAX) 17 g packet Take 17 g by mouth daily. 14 each 0   pravastatin (PRAVACHOL) 10 MG tablet Take 1 tablet (10 mg total) by mouth daily. 90 tablet 1   prednisoLONE acetate (PRED FORTE) 1 % ophthalmic suspension Place 1 drop into both eyes every 4 (four) hours. 5 mL 0   senna-docusate (SENOKOT-S) 8.6-50 MG tablet Take 2 tablets by mouth 2 (two) times daily. 60 tablet 0   warfarin (COUMADIN) 5 MG tablet Take 1 tablet (5 mg total) by mouth daily. TAKE 1 TABLET BY MOUTH ONCE DAILY OR AS DIRECTED BY ANTICOAGULATION CLINIC 30 tablet 1   No facility-administered medications prior to visit.    Review of Systems  Constitutional:  Negative for chills, fever, malaise/fatigue and weight loss.  HENT:  Positive for congestion. Negative for nosebleeds, sinus pain and sore throat.   Eyes:  Negative for photophobia, pain and discharge.  Respiratory:  Positive for cough and shortness of breath. Negative for hemoptysis, sputum production and wheezing.   Cardiovascular:  Negative for chest pain, palpitations, orthopnea and leg swelling.  Gastrointestinal:  Negative for abdominal pain, constipation, diarrhea, nausea and vomiting.  Genitourinary:  Negative for dysuria, frequency, hematuria and urgency.  Musculoskeletal:  Negative for back pain, joint pain, myalgias and neck pain.  Skin:  Negative for itching and rash.  Neurological:  Negative for tingling, tremors, sensory change, speech change, focal  weakness, seizures, weakness and headaches.  Psychiatric/Behavioral:  Negative for memory loss, substance abuse and suicidal ideas. The patient is not nervous/anxious.       Objective:  Physical Exam   Vitals:   02/01/22 1134  BP: (!) 140/92  Pulse: 82  SpO2: 100%  Weight: 209 lb 12.8 oz (95.2 kg)  Height: '5\' 7"'$  (1.702 m)    Gen: well appearing HENT: OP clear, TM's clear, neck supple PULM: CTA B, normal percussion CV: RRR, no mgr, trace edema GI: BS+, soft, nontender Derm: no cyanosis or rash Psyche: normal mood and affect   CBC    Component Value Date/Time   WBC 6.0 01/24/2022 1241   RBC 4.24 01/24/2022 1241   HGB 12.5 01/24/2022 1241   HCT 37.2 01/24/2022 1241   PLT 253.0 01/24/2022 1241   MCV 87.6 01/24/2022 1241   MCH 29.9 12/31/2021 0556   MCHC 33.7 01/24/2022 1241   RDW 13.3 01/24/2022 1241   LYMPHSABS 2.0 01/24/2022 1241   MONOABS 0.4 01/24/2022 1241   EOSABS 0.2 01/24/2022 1241   BASOSABS 0.0 01/24/2022 1241     Chest imaging: December 26, 2021 CT angiogram chest images independently reviewed showing a large lobar pulmonary embolism with saddle component, RV to LV ratio 1.5, no pulmonary parenchymal abnormalities  PFT:  Labs:  Path:  Echo: December 27, 2021 TTE: LVEF 65 to 70%, RV size and function normal  Heart Catheterization:       Assessment & Plan:   Acute pulmonary embolism without acute cor pulmonale, unspecified pulmonary embolism type ([place]) - Plan: ECHOCARDIOGRAM COMPLETE  Uncomplicated asthma, unspecified asthma severity, unspecified whether  persistent - Plan: Pulmonary function test  Shortness of breath  Discussion: She has some dyspnea after having her submassive pulmonary embolism which is in keeping with the natural history of PE.  This may also be due to underlying chronic lung disease given her extensive smoking history.  Will be difficult to sort that out right now.  She should have a lung function test on the next  visit.  Most people have complete resolution of any RV strain from an acute pulmonary embolism.  We will ensure that this does not become a chronic problem for her by checking an echocardiogram around 6 months from the time of the original pulmonary embolism.  Plan: Acute provoked pulmonary embolism: Keep taking warfarin as directed by the anticoagulation clinic We will check an echocardiogram in about 4 months May is likely when we see you back in March we will direct you to stop taking warfarin, continue taking it between now and then.  Shortness of breath: Lung function test next visit  Asthma, former smoker: Continue Symbicort 2 puffs twice a day with a spacer as you are doing Use albuterol as needed for chest tightness wheezing or shortness of breath HFA instruction provided by me and nursing today  Follow-up in late March with me  Immunizations: Immunization History  Administered Date(s) Administered   Fluad Quad(high Dose 65+) 04/09/2019, 01/07/2021, 01/23/2022   Influenza, High Dose Seasonal PF 06/10/2018   PFIZER(Purple Top)SARS-COV-2 Vaccination 06/15/2019, 07/07/2019   PPD Test 02/29/2016     Current Outpatient Medications:    acetaminophen (TYLENOL) 325 MG tablet, Take 650 mg by mouth every 4 (four) hours as needed for mild pain or moderate pain., Disp: , Rfl:    albuterol (PROVENTIL HFA) 108 (90 Base) MCG/ACT inhaler, Inhale 2 puffs into the lungs every 6 (six) hours as needed for wheezing or shortness of breath., Disp: 18 g, Rfl: 2   albuterol (PROVENTIL) (2.5 MG/3ML) 0.083% nebulizer solution, Take 3 mLs (2.5 mg total) by nebulization every 6 (six) hours as needed for wheezing or shortness of breath., Disp: 150 mL, Rfl: 1   amLODipine (NORVASC) 2.5 MG tablet, Take 2.5 mg by mouth daily., Disp: , Rfl:    apixaban (ELIQUIS) 5 MG TABS tablet, Take 1 tablet (5 mg total) by mouth 2 (two) times daily. Take 2 tablets on 01/03/22 at 9pm and then start 1 tablet twice daily from  01/04/22, Disp: 60 tablet, Rfl: 2   budesonide-formoterol (SYMBICORT) 80-4.5 MCG/ACT inhaler, Inhale 2 puffs into the lungs 2 (two) times daily., Disp: 1 each, Rfl: 3   Cholecalciferol (VITAMIN D-3) 25 MCG (1000 UT) CAPS, Take 2,000 Units by mouth daily., Disp: , Rfl:    dorzolamide-timolol (COSOPT) 22.3-6.8 MG/ML ophthalmic solution, Place 1 drop into the right eye 2 (two) times daily., Disp: , Rfl:    latanoprost (XALATAN) 0.005 % ophthalmic solution, Place 1 drop into both eyes at bedtime. As directed, Disp: , Rfl:    levothyroxine (SYNTHROID) 150 MCG tablet, Take 150 mcg by mouth daily., Disp: , Rfl:    metoprolol tartrate (LOPRESSOR) 25 MG tablet, Take 1 tablet (25 mg total) by mouth 2 (two) times daily., Disp: 60 tablet, Rfl: 2   pantoprazole (PROTONIX) 40 MG tablet, Take 1 tablet (40 mg total) by mouth 2 (two) times daily before a meal., Disp: 60 tablet, Rfl: 1   polyethylene glycol (MIRALAX / GLYCOLAX) 17 g packet, Take 17 g by mouth daily., Disp: 14 each, Rfl: 0   pravastatin (PRAVACHOL) 10 MG  tablet, Take 1 tablet (10 mg total) by mouth daily., Disp: 90 tablet, Rfl: 1   prednisoLONE acetate (PRED FORTE) 1 % ophthalmic suspension, Place 1 drop into both eyes every 4 (four) hours., Disp: 5 mL, Rfl: 0   senna-docusate (SENOKOT-S) 8.6-50 MG tablet, Take 2 tablets by mouth 2 (two) times daily., Disp: 60 tablet, Rfl: 0   warfarin (COUMADIN) 5 MG tablet, Take 1 tablet (5 mg total) by mouth daily. TAKE 1 TABLET BY MOUTH ONCE DAILY OR AS DIRECTED BY ANTICOAGULATION CLINIC, Disp: 30 tablet, Rfl: 1

## 2022-02-01 ENCOUNTER — Ambulatory Visit (INDEPENDENT_AMBULATORY_CARE_PROVIDER_SITE_OTHER): Payer: Medicare Other

## 2022-02-01 ENCOUNTER — Ambulatory Visit (INDEPENDENT_AMBULATORY_CARE_PROVIDER_SITE_OTHER): Payer: Medicare Other | Admitting: Pulmonary Disease

## 2022-02-01 ENCOUNTER — Ambulatory Visit: Payer: Medicare Other

## 2022-02-01 ENCOUNTER — Encounter: Payer: Self-pay | Admitting: Pulmonary Disease

## 2022-02-01 VITALS — BP 140/92 | HR 82 | Ht 67.0 in | Wt 209.8 lb

## 2022-02-01 DIAGNOSIS — J45909 Unspecified asthma, uncomplicated: Secondary | ICD-10-CM

## 2022-02-01 DIAGNOSIS — Z7901 Long term (current) use of anticoagulants: Secondary | ICD-10-CM | POA: Diagnosis not present

## 2022-02-01 DIAGNOSIS — I2699 Other pulmonary embolism without acute cor pulmonale: Secondary | ICD-10-CM | POA: Diagnosis not present

## 2022-02-01 DIAGNOSIS — R0602 Shortness of breath: Secondary | ICD-10-CM | POA: Diagnosis not present

## 2022-02-01 LAB — POCT INR: INR: 2.1 (ref 2.0–3.0)

## 2022-02-01 NOTE — Patient Instructions (Signed)
Continue to take one 5 mg tablet daily.  Recheck in one week.

## 2022-02-01 NOTE — Patient Instructions (Signed)
Acute provoked pulmonary embolism: Keep taking warfarin as directed by the anticoagulation clinic We will check an echocardiogram in about 4 months May is likely when we see you back in March we will direct you to stop taking warfarin, continue taking it between now and then.  Shortness of breath: Lung function test next visit  Asthma, former smoker: Continue Symbicort 2 puffs twice a day with a spacer as you are doing Use albuterol as needed for chest tightness wheezing or shortness of breath HFA instruction provided by me and nursing today  Follow-up in late March with me

## 2022-02-01 NOTE — Telephone Encounter (Signed)
Patient was seen today by BQ. Patient has switched over to warfarin from Eliquis. Will discard patient assistance application.   Nothing further needed.

## 2022-02-01 NOTE — Progress Notes (Signed)
Continue to take one 5 mg tablet daily.  Recheck in one week.

## 2022-02-06 ENCOUNTER — Ambulatory Visit: Payer: Medicare Other | Admitting: Physician Assistant

## 2022-02-08 ENCOUNTER — Ambulatory Visit (INDEPENDENT_AMBULATORY_CARE_PROVIDER_SITE_OTHER): Payer: Medicare Other

## 2022-02-08 DIAGNOSIS — Z7901 Long term (current) use of anticoagulants: Secondary | ICD-10-CM | POA: Diagnosis not present

## 2022-02-08 LAB — POCT INR: INR: 2.1 (ref 2.0–3.0)

## 2022-02-08 NOTE — Progress Notes (Signed)
Continue to take one 5 mg tablet daily.  Recheck in one week.

## 2022-02-08 NOTE — Patient Instructions (Signed)
Continue to take one 5 mg tablet daily.  Recheck in one week.

## 2022-02-10 ENCOUNTER — Telehealth: Payer: Self-pay | Admitting: Physician Assistant

## 2022-02-10 NOTE — Telephone Encounter (Signed)
Reason for Referral Request:   Previous thyroid doctor is gone  Has Patient been seen by PCP for this complaint?   Yes, Please find out following information:  Reason: Thyroid  Referral to which Specialty: "Thyroid doctor"  Preferred office/ provider:   None

## 2022-02-13 ENCOUNTER — Telehealth: Payer: Self-pay | Admitting: Physician Assistant

## 2022-02-13 ENCOUNTER — Ambulatory Visit: Payer: Medicare Other | Admitting: Family Medicine

## 2022-02-13 MED ORDER — PANTOPRAZOLE SODIUM 40 MG PO TBEC: 40.0000 mg | DELAYED_RELEASE_TABLET | Freq: Two times a day (BID) | ORAL | 0 refills | Status: DC

## 2022-02-13 MED ORDER — PRAVASTATIN SODIUM 10 MG PO TABS: 10.0000 mg | ORAL_TABLET | Freq: Every day | ORAL | 0 refills | Status: DC

## 2022-02-13 MED ORDER — LEVOTHYROXINE SODIUM 150 MCG PO TABS: 150.0000 ug | ORAL_TABLET | Freq: Every day | ORAL | 0 refills | Status: DC

## 2022-02-13 MED ORDER — METOPROLOL TARTRATE 25 MG PO TABS: 25.0000 mg | ORAL_TABLET | Freq: Two times a day (BID) | ORAL | 0 refills | Status: DC

## 2022-02-13 MED ORDER — AMLODIPINE BESYLATE 2.5 MG PO TABS: 2.5000 mg | ORAL_TABLET | Freq: Every day | ORAL | 0 refills | Status: DC

## 2022-02-13 NOTE — Telephone Encounter (Signed)
Spoke to pt's daughter [name], told her Rx's sent to pharmacy as requested. Told her pt needs f/u appt for blood pressure she no showed last appt. Asked her if pt can do same day as Coumadin check, I have an 11:20 opening and can see her after? Crystal said yes, that is fine. Told her okay, appt scheduled. Crystal verbalized understanding.

## 2022-02-13 NOTE — Telephone Encounter (Signed)
  LAST APPOINTMENT DATE: 01/23/22 NEXT APPOINTMENT DATE: 02/15/22  MEDICATION:pantoprazole (PROTONIX) 40 MG tablet  levothyroxine (SYNTHROID) 150 MCG tablet  amLODipine (NORVASC) 2.5 MG tablet  metoprolol tartrate (LOPRESSOR) 25 MG tablet  pravastatin (PRAVACHOL) 10 MG tablet   Is the patient out of medication? She has a couple days worth of medication  PHARMACY: [facility] [address], [place], [place] [id number]  Caller states: - Patient was initially prescribed these medications by PCP prior to them being adjusted by Dr. [place] Pink when she was in the hospital.

## 2022-02-13 NOTE — Telephone Encounter (Signed)
Caller States: -She need medications refilled. But is not sure which ones and could not read the labels very well. -She thinks the dosages were lowered when she was in the hospital and may needed to be adjusted. -She will have her daughter call back with medication names and more specific details.

## 2022-02-13 NOTE — Telephone Encounter (Signed)
Please see message and advise if okay to refill medications?

## 2022-02-13 NOTE — Telephone Encounter (Signed)
PT requesting referral for new provider to follow her Thyroid. Okay to send referral?

## 2022-02-13 NOTE — Addendum Note (Signed)
Addended by: Marian Sorrow on: 02/13/2022 04:58 PM   Modules accepted: Orders

## 2022-02-15 ENCOUNTER — Ambulatory Visit (INDEPENDENT_AMBULATORY_CARE_PROVIDER_SITE_OTHER): Payer: Medicare Other

## 2022-02-15 ENCOUNTER — Ambulatory Visit (INDEPENDENT_AMBULATORY_CARE_PROVIDER_SITE_OTHER): Payer: Medicare Other | Admitting: Physician Assistant

## 2022-02-15 ENCOUNTER — Telehealth: Payer: Self-pay | Admitting: Physician Assistant

## 2022-02-15 ENCOUNTER — Encounter: Payer: Self-pay | Admitting: Physician Assistant

## 2022-02-15 VITALS — BP 150/90 | HR 77 | Temp 97.8°F | Ht 67.0 in | Wt 207.2 lb

## 2022-02-15 DIAGNOSIS — I10 Essential (primary) hypertension: Secondary | ICD-10-CM | POA: Diagnosis not present

## 2022-02-15 DIAGNOSIS — Z7901 Long term (current) use of anticoagulants: Secondary | ICD-10-CM | POA: Diagnosis not present

## 2022-02-15 DIAGNOSIS — E890 Postprocedural hypothyroidism: Secondary | ICD-10-CM | POA: Diagnosis not present

## 2022-02-15 LAB — POCT INR: INR: 2.6 (ref 2.0–3.0)

## 2022-02-15 NOTE — Progress Notes (Signed)
Continue to take one 5 mg tablet daily.  Recheck in one week.

## 2022-02-15 NOTE — Telephone Encounter (Signed)
Kari Coleman ordered today at appointment.

## 2022-02-15 NOTE — Patient Instructions (Addendum)
It was great to see you!  Kari Coleman was previously on a higher dose of amlodipine and on a third blood pressure medication, lisinopril.  She is currently only prescribed metoprolol 25 mg twice daily and amlodipine 2.5 mg daily. She has a history of uncontrolled blood pressure. Please keep close tabs on her blood pressure so we can determine if we need to increase her medication.  I am placing a new referral for endocrinology.  [name]

## 2022-02-15 NOTE — Patient Instructions (Addendum)
Continue to take one 5 mg tablet daily.  Recheck in one week, on 02/22/22 at 10:30.

## 2022-02-15 NOTE — Progress Notes (Signed)
Kari Coleman is a 71 y.o. female here for a follow up of a pre-existing problem.  History of Present Illness:   Chief Complaint  Patient presents with   Hypertension    HPI  Hypertension  Patient reports that she does not regularly check her blood pressure at home. She states that when she does check it, her readings fluctuate.  She cannot give me specific readings today.  She reports that she has not taken her blood pressure medicine today or yesterday.  She is currently prescribed 2.5 mg Norvasc and 25 mg Lopressor twice daily.  Prior to her leaving the rehab facility, she was taking amlodipine 10 mg daily as well as lisinopril 40 mg daily but this was discontinued amlodipine 10 mg daily as well as lisinopril 40 mg daily but this was discontinued due to her blood pressure control.  Patient denies chest pain, SOB, blurred vision, dizziness, unusual headaches, lower leg swelling. Denies excessive caffeine intake, stimulant usage, excessive alcohol intake, or increase in salt consumption.  BP Readings from Last 3 Encounters:  02/15/22 (!) 150/90  02/01/22 (!) 140/92  01/23/22 (!) 150/98   Hypothyroidism Patient is requesting a referral to an endocrinologist for her thyroid.  She was seeing Dr. Loanne Drilling but does not see him anymore.  She is taking levothyroxine 150 mcg daily.   Past Medical History:  Diagnosis Date   Allergy    Arthritis    Cataract    Cervical cancer ([place])    GERD (gastroesophageal reflux disease)    Glaucoma 2017   History of echocardiogram    Echo 9/16:  Mild LVH, EF 55-60%, no RWMA, Gr 1 DD, mild MR // Echo 1/17:  Mild LVH, EF 50-55%, Gr 1 DD, mild to mod MR, mild LAE   Hypertension    Hyperthyroidism    s/p RAI treatment   Palpitations    PONV (postoperative nausea and vomiting)    years ago  . Woke up and saw asian people    PONV (postoperative nausea and vomiting) 07/18/2021   Retinal detachment    L eye - partial blindness   Tobacco abuse     Tuberculosis    positive test as a caregiver cxr annually    Urinary incontinence      Social History   Tobacco Use   Smoking status: Former    Packs/day: 0.50    Years: 48.00    Total pack years: 24.00    Types: Cigarettes    Start date: 11/30/1968    Quit date: 10/31/2016    Years since quitting: 5.2   Smokeless tobacco: Never   Tobacco comments:    did not smoke more than .5 pack   Vaping Use   Vaping Use: Never used  Substance Use Topics   Alcohol use: Not Currently    Alcohol/week: 0.0 standard drinks of alcohol    Comment: rarely once a year   Drug use: No    Past Surgical History:  Procedure Laterality Date   ABDOMINAL HYSTERECTOMY  1984   bladder surgey for incontinence     EYE SURGERY     REPAIR OF COMPLEX TRACTION RETINAL DETACHMENT     thyroid radiation     TOTAL ABDOMINAL HYSTERECTOMY W/ BILATERAL SALPINGOOPHORECTOMY     TOTAL KNEE ARTHROPLASTY Right 04/22/2019   Procedure: RIGHT TOTAL KNEE ARTHROPLASTY;  Surgeon: Melrose Nakayama, MD;  Location: [facility];  Service: Orthopedics;  Laterality: Right;  PENDING PRE-OP FOR SDDC APPROVAL    Family  History  Problem Relation Age of Onset   Hypertension Mother    Heart failure Mother    Diabetes Mother    Pancreatic cancer Father    Colon cancer Maternal Aunt    Cancer Sister    Thyroid disease Paternal Aunt    Cancer Paternal Aunt    Thyroid disease Other        paternal side   Cancer Maternal Grandfather    Diabetes Paternal Grandmother    Diabetes Maternal Uncle    Colon polyps Neg Hx    Stomach cancer Neg Hx     Allergies  Allergen Reactions   Doxycycline Nausea Only   Codeine Nausea And Vomiting   Pollen Extract Other (See Comments)    Unknown reaction     Current Medications:   Current Outpatient Medications:    acetaminophen (TYLENOL) 325 MG tablet, Take 650 mg by mouth every 4 (four) hours as needed for mild pain or moderate pain., Disp: , Rfl:    albuterol (PROVENTIL HFA) 108 (90 Base)  MCG/ACT inhaler, Inhale 2 puffs into the lungs every 6 (six) hours as needed for wheezing or shortness of breath., Disp: 18 g, Rfl: 2   albuterol (PROVENTIL) (2.5 MG/3ML) 0.083% nebulizer solution, Take 3 mLs (2.5 mg total) by nebulization every 6 (six) hours as needed for wheezing or shortness of breath., Disp: 150 mL, Rfl: 1   amLODipine (NORVASC) 2.5 MG tablet, Take 1 tablet (2.5 mg total) by mouth daily., Disp: 90 tablet, Rfl: 0   budesonide-formoterol (SYMBICORT) 80-4.5 MCG/ACT inhaler, Inhale 2 puffs into the lungs 2 (two) times daily., Disp: 1 each, Rfl: 3   Cholecalciferol (VITAMIN D-3) 25 MCG (1000 UT) CAPS, Take 2,000 Units by mouth daily., Disp: , Rfl:    dorzolamide-timolol (COSOPT) 22.3-6.8 MG/ML ophthalmic solution, Place 1 drop into the right eye 2 (two) times daily., Disp: , Rfl:    latanoprost (XALATAN) 0.005 % ophthalmic solution, Place 1 drop into both eyes at bedtime. As directed, Disp: , Rfl:    levothyroxine (SYNTHROID) 150 MCG tablet, Take 1 tablet (150 mcg total) by mouth daily., Disp: 90 tablet, Rfl: 0   metoprolol tartrate (LOPRESSOR) 25 MG tablet, Take 1 tablet (25 mg total) by mouth 2 (two) times daily., Disp: 180 tablet, Rfl: 0   pantoprazole (PROTONIX) 40 MG tablet, Take 1 tablet (40 mg total) by mouth 2 (two) times daily before a meal., Disp: 180 tablet, Rfl: 0   polyethylene glycol (MIRALAX / GLYCOLAX) 17 g packet, Take 17 g by mouth daily., Disp: 14 each, Rfl: 0   pravastatin (PRAVACHOL) 10 MG tablet, Take 1 tablet (10 mg total) by mouth daily., Disp: 90 tablet, Rfl: 0   prednisoLONE acetate (PRED FORTE) 1 % ophthalmic suspension, Place 1 drop into both eyes every 4 (four) hours., Disp: 5 mL, Rfl: 0   senna-docusate (SENOKOT-S) 8.6-50 MG tablet, Take 2 tablets by mouth 2 (two) times daily., Disp: 60 tablet, Rfl: 0   warfarin (COUMADIN) 5 MG tablet, Take 1 tablet (5 mg total) by mouth daily. TAKE 1 TABLET BY MOUTH ONCE DAILY OR AS DIRECTED BY ANTICOAGULATION CLINIC,  Disp: 30 tablet, Rfl: 1   Review of Systems:   ROS Negative unless otherwise specified per HPI.   Vitals:   Vitals:   02/15/22 1113 02/15/22 1138  BP: (!) 140/90 (!) 150/90  Pulse: 77   Temp: 97.8 F (36.6 C)   TempSrc: Temporal   SpO2: 97%   Weight: 207 lb 4 oz (94  kg)   Height: '5\' 7"'$  (1.702 m)      Body mass index is 32.46 kg/m.  Physical Exam:   Physical Exam Constitutional:      General: She is not in acute distress.    Appearance: Normal appearance. She is not ill-appearing.  HENT:     Head: Normocephalic and atraumatic.     Right Ear: External ear normal.     Left Ear: External ear normal.  Eyes:     Extraocular Movements: Extraocular movements intact.     Pupils: Pupils are equal, round, and reactive to light.  Cardiovascular:     Rate and Rhythm: Normal rate and regular rhythm.     Heart sounds: Normal heart sounds. No murmur heard.    No gallop.  Pulmonary:     Effort: Pulmonary effort is normal. No respiratory distress.     Breath sounds: Normal breath sounds. No wheezing or rales.  Skin:    General: Skin is warm and dry.  Neurological:     Mental Status: She is alert and oriented to person, place, and time.  Psychiatric:        Judgment: Judgment normal.     Assessment and Plan:   Postablative hypothyroidism Referral to endocrinology placed I have refilled her levothyroxine today She refuses TSH checked today  Essential hypertension Above goal No red flags on exam Continue amlodipine 2.5 mg daily and Lopressor 25 mg twice daily When she returns for Coumadin clinic next week we will have our RN recheck her blood pressure and adjust medication accordingly Low threshold to increase amlodipine, and then may add on lisinopril if needed  I,Verona Buck,acting as a scribe for [employer], PA.,have documented all relevant documentation on the behalf of Inda Coke, PA,as directed by  Inda Coke, PA while in the presence of Inda Coke, [place].  I, Inda Coke, [place], have reviewed all documentation for this visit. The documentation on 02/15/22 for the exam, diagnosis, procedures, and orders are all accurate and complete.  Inda Coke, PA-C

## 2022-02-15 NOTE — Telephone Encounter (Signed)
FYI  Caller states: -During PT visit patient's BP was 162/84.  - She was informed by pt that BP was high since she had to take her BP meds later than usual today. She was unable to take them at regular time since they were just refilled  - Pt had OV with PCP today where BP was 150/90 - Pt has no sx.   For further questions, Tammy can be reached at [phone].

## 2022-02-16 NOTE — Telephone Encounter (Signed)
FYI, see message. 

## 2022-02-17 ENCOUNTER — Telehealth: Payer: Self-pay | Admitting: Physician Assistant

## 2022-02-17 NOTE — Telephone Encounter (Signed)
..  Home Health Verbal Orders  Agency:  [facility]   Caller: Loel Ro and title  Speech Therapist [phone]   Requesting Speech:    Reason for Request:  Verbal orders for 5 additional Home Health Visits for Speech Therapy  Frequency:  1 x per week for 5 weeks effective 02/20/22  HH needs F2F w/in last 30 days

## 2022-02-17 NOTE — Telephone Encounter (Signed)
[place] and spoke to [place], verbal orders given for Speech Therapy 1 x per week for 5 weeks, okay per [name]. Malachy Mood verbalized understanding.

## 2022-02-17 NOTE — Telephone Encounter (Signed)
Spoke to pt asked her if she has been taking her blood pressure and how is it? Pt said yes, it was 134/80 yesterday. Told her good, continue to monitor and take your medication. If your blood pressure is how over the weekend 140/90 or higher we need to see you on Monday. Pt verbalized understanding.

## 2022-02-19 ENCOUNTER — Other Ambulatory Visit: Payer: Self-pay | Admitting: Physician Assistant

## 2022-02-22 ENCOUNTER — Telehealth: Payer: Self-pay | Admitting: Physician Assistant

## 2022-02-22 ENCOUNTER — Ambulatory Visit (INDEPENDENT_AMBULATORY_CARE_PROVIDER_SITE_OTHER): Payer: Medicare Other

## 2022-02-22 DIAGNOSIS — Z7901 Long term (current) use of anticoagulants: Secondary | ICD-10-CM | POA: Diagnosis not present

## 2022-02-22 LAB — POCT INR: INR: 3 (ref 2.0–3.0)

## 2022-02-22 MED ORDER — WARFARIN SODIUM 5 MG PO TABS: 5.0000 mg | ORAL_TABLET | Freq: Every day | ORAL | 1 refills | Status: DC

## 2022-02-22 NOTE — Progress Notes (Signed)
Continue to take one 5 mg tablet daily.  Recheck in two weeks.

## 2022-02-22 NOTE — Patient Instructions (Addendum)
Continue to take one 5 mg tablet daily.  Recheck in two weeks, on 03/08/22 at 11:15.

## 2022-02-22 NOTE — Telephone Encounter (Signed)
..  Type of form received: [place] and Plan of Care  Additional comments:  [facility]   Received by: [facility]  Form should be Faxed to: [phone]  Form should be mailed to:  N/a  Is patient requesting call for pickup: No   Form placed:   In provider's box  Attach charge sheet. Yes  Individual made aware of 3-5 business day turn around (Y/N)? N/A

## 2022-02-22 NOTE — Telephone Encounter (Signed)
Pt States: -Seen at [facility] today 02/22/22, instructed to call back. -Pt took medication in the morning. -At 3:00pm today, blood pressure was 149/80.   Pt requests: -Follow up as needed

## 2022-02-27 NOTE — Telephone Encounter (Signed)
Left message on voicemail to call office.  

## 2022-03-03 NOTE — Telephone Encounter (Signed)
Tried to contact pt to follow up on blood pressure, voicemail box is full unable to leave message.

## 2022-03-03 NOTE — Telephone Encounter (Signed)
Signed by [name] and faxed 03/02/2022.

## 2022-03-08 ENCOUNTER — Ambulatory Visit (INDEPENDENT_AMBULATORY_CARE_PROVIDER_SITE_OTHER): Payer: Medicare Other

## 2022-03-08 DIAGNOSIS — Z7901 Long term (current) use of anticoagulants: Secondary | ICD-10-CM

## 2022-03-08 LAB — POCT INR: INR: 2.5 (ref 2.0–3.0)

## 2022-03-08 NOTE — Progress Notes (Addendum)
Pt reports she thinks she is to have eye surgery at the beginning of Jan. She will call with details.   Continue to take one 5 mg tablet daily.  Recheck in 4 weeks.

## 2022-03-08 NOTE — Patient Instructions (Addendum)
Pre visit review using our clinic review tool, if applicable. No additional management support is needed unless otherwise documented below in the visit note.  Continue to take one 5 mg tablet daily.  Recheck in 4 weeks.

## 2022-03-09 ENCOUNTER — Telehealth: Payer: Self-pay | Admitting: Physician Assistant

## 2022-03-09 NOTE — Telephone Encounter (Signed)
Kari Coleman from [facility] - needs verbal orders for the above, please call her at [phone]  Verbal orders for OT   1x a week for 3 x wks- energy conservation

## 2022-03-13 NOTE — Telephone Encounter (Signed)
Please advise 

## 2022-03-13 NOTE — Telephone Encounter (Signed)
I called and spoke to [name], gave verbal orders. [name] understood and thanked me.

## 2022-03-14 ENCOUNTER — Telehealth: Payer: Self-pay | Admitting: Physician Assistant

## 2022-03-14 NOTE — Telephone Encounter (Signed)
Patient states she would like to get in touch with the nurses at the coumadin clinic to answer some questions about her coumadin treatment. Patient can be reached at [phone].

## 2022-03-14 NOTE — Telephone Encounter (Signed)
Pt LVM reporting she did not know when her next coumadin clinic apt was. Tried to contact pt but had to LVM with date and time of next apt.

## 2022-03-15 NOTE — Telephone Encounter (Signed)
LVM

## 2022-03-16 ENCOUNTER — Telehealth: Payer: Self-pay | Admitting: Physician Assistant

## 2022-03-16 NOTE — Telephone Encounter (Signed)
Kari Coleman with Kari Coleman states that Patient missed her appointment for OT today.

## 2022-03-16 NOTE — Telephone Encounter (Signed)
Still no response from VM left for pt to return call. Tried to contact pt's daughters. Kari Coleman's VM was full. LVM with Lauren.

## 2022-03-17 NOTE — Telephone Encounter (Signed)
FYI, see message. 

## 2022-03-23 ENCOUNTER — Telehealth: Payer: Self-pay | Admitting: Pulmonary Disease

## 2022-03-23 NOTE — Telephone Encounter (Signed)
[place] [place] desk,  Can we try calling this patient again to get her set up for an office visit. Either with an App or [place].   Thank you

## 2022-03-23 NOTE — Telephone Encounter (Signed)
Covid positive back in september. Pt states that her right foot is swollen she states its not too bad but it swells up when she walk. Pt is still on her Coumadin '5mg'$  daily due to her pulmonary embolism she is being seen for by Dr Lake Bells. Pt states that she has also noticed that as the day progresses her voice goes out. Pt continues Symbicort inhaler and does rinse her mouth out after each use.   Please advise sir

## 2022-03-23 NOTE — Telephone Encounter (Signed)
Pt returned call. She reports she wants to contact her pulmonologist but cannot remember his name. Gave physician name and phone number. Pt reminded of next coumadin clinic apt. Pt verbalized understanding and was appreciative of the help.

## 2022-04-05 ENCOUNTER — Ambulatory Visit (INDEPENDENT_AMBULATORY_CARE_PROVIDER_SITE_OTHER): Payer: Medicare Other

## 2022-04-05 DIAGNOSIS — Z7901 Long term (current) use of anticoagulants: Secondary | ICD-10-CM | POA: Diagnosis not present

## 2022-04-05 LAB — POCT INR: INR: 2.3 (ref 2.0–3.0)

## 2022-04-05 NOTE — Patient Instructions (Addendum)
Pre visit review using our clinic review tool, if applicable. No additional management support is needed unless otherwise documented below in the visit note.  Continue to take one 5 mg tablet daily.  Recheck in 4 weeks.

## 2022-04-05 NOTE — Progress Notes (Signed)
Continue to take one 5 mg tablet daily.  Recheck in 4 weeks.

## 2022-04-06 ENCOUNTER — Other Ambulatory Visit: Payer: Self-pay

## 2022-04-06 ENCOUNTER — Encounter: Payer: Self-pay | Admitting: Pulmonary Disease

## 2022-04-06 ENCOUNTER — Ambulatory Visit: Payer: Medicare Other | Admitting: Pulmonary Disease

## 2022-04-06 VITALS — BP 160/78 | HR 74 | Temp 98.4°F | Ht 67.0 in | Wt 208.0 lb

## 2022-04-06 DIAGNOSIS — R0602 Shortness of breath: Secondary | ICD-10-CM | POA: Diagnosis not present

## 2022-04-06 DIAGNOSIS — I2699 Other pulmonary embolism without acute cor pulmonale: Secondary | ICD-10-CM

## 2022-04-06 DIAGNOSIS — J45909 Unspecified asthma, uncomplicated: Secondary | ICD-10-CM

## 2022-04-06 NOTE — Progress Notes (Signed)
Synopsis: First seen in September 2023 when hospitalized for a provoked pulmonary embolism after having COVID.  Treated with conservative management. Smoked 1/2 ppd for 48 years, quit in 2018.    Subjective:   PATIENT ID: Kari Coleman GENDER: female DOB: 06/18/1950, MRN: [id number]   HPI  Chief Complaint  Patient presents with   Follow-up    Pt states she has had some swelling in her right foot x 1 week.     Kari Coleman came back to see [place] sooner than previously arranged because she had an abnormal feeling in her chest and abdomen and some swelling in her left foot.  She says that the swelling in her foot popped up 2 weeks ago, lasted a week and is now recovered.    She describes a "feeling" in her abdomen which similar to hot flashes in the past.  No chest pain, but she has occassional dyspnea.  The albuterol helps with the dyspnea when it occurs.    She doesn't cough.  She has some hoarseness which comes and goes.    She continues to take warfarin.  Her INR is 2.3   Past Medical History:  Diagnosis Date   Allergy    Arthritis    Cataract    Cervical cancer ([place])    GERD (gastroesophageal reflux disease)    Glaucoma 2017   History of echocardiogram    Echo 9/16:  Mild LVH, EF 55-60%, no RWMA, Gr 1 DD, mild MR // Echo 1/17:  Mild LVH, EF 50-55%, Gr 1 DD, mild to mod MR, mild LAE   Hypertension    Hyperthyroidism    s/p RAI treatment   Palpitations    PONV (postoperative nausea and vomiting)    years ago  . Woke up and saw asian people    PONV (postoperative nausea and vomiting) 07/18/2021   Retinal detachment    L eye - partial blindness   Tobacco abuse    Tuberculosis    positive test as a caregiver cxr annually    Urinary incontinence        Review of Systems  Constitutional:  Negative for chills, fever, malaise/fatigue and weight loss.  HENT:  Negative for congestion, sinus pain and sore throat.   Respiratory:  Positive for shortness of breath. Negative for  cough and sputum production.   Cardiovascular:  Negative for chest pain and leg swelling.      Objective:  Physical Exam   Vitals:   04/06/22 0934  BP: (!) 160/78  Pulse: 74  Temp: 98.4 F (36.9 C)  TempSrc: Oral  SpO2: 99%  Weight: 208 lb (94.3 kg)  Height: '5\' 7"'$  (1.702 m)    Gen: well appearing HENT: OP clear, neck supple PULM: CTA B, normal effort  CV: RRR, no mgr GI: BS+, soft, nontender Derm: no cyanosis or rash Psyche: normal mood and affect    CBC    Component Value Date/Time   WBC 6.0 01/24/2022 1241   RBC 4.24 01/24/2022 1241   HGB 12.5 01/24/2022 1241   HCT 37.2 01/24/2022 1241   PLT 253.0 01/24/2022 1241   MCV 87.6 01/24/2022 1241   MCH 29.9 12/31/2021 0556   MCHC 33.7 01/24/2022 1241   RDW 13.3 01/24/2022 1241   LYMPHSABS 2.0 01/24/2022 1241   MONOABS 0.4 01/24/2022 1241   EOSABS 0.2 01/24/2022 1241   BASOSABS 0.0 01/24/2022 1241     Chest imaging: December 26, 2021 CT angiogram chest images independently reviewed showing a large  lobar pulmonary embolism with saddle component, RV to LV ratio 1.5, no pulmonary parenchymal abnormalities  PFT:  Labs:  Path:  Echo: December 27, 2021 TTE: LVEF 65 to 70%, RV size and function normal  Heart Catheterization:       Assessment & Plan:   Shortness of breath  Acute pulmonary embolism without acute cor pulmonale, unspecified pulmonary embolism type ([place])  Uncomplicated asthma, unspecified asthma severity, unspecified whether persistent  Discussion: Jamacia has been experiencing an unusual sensation in her chest and abdomen recently which is hard to describe.  She says it is actually similar to something that she experienced in the past related to menopause.  Today her physical exam is within normal limits.  It is difficult to know what is causing this sensation but considering the pulmonary embolism she experienced recently I think it is reasonable to check her echo sooner rather than later  to ensure there is no evidence of pulmonary hypertension.  Plan: Provoked submassive pulmonary embolism: Repeat echocardiogram now Continue warfarin as you are doing Plan for warfarin through March 2024  Asthma: Continue Symbicort 2 puffs twice a day Continue albuterol as needed for chest tightness wheezing or shortness of breath Pulmonary function test next visit  We will see you back in March as previously arranged or sooner if needed  Immunizations: Immunization History  Administered Date(s) Administered   Fluad Quad(high Dose 65+) 04/09/2019, 01/07/2021, 01/23/2022   Influenza, High Dose Seasonal PF 06/10/2018   PFIZER(Purple Top)SARS-COV-2 Vaccination 06/15/2019, 07/07/2019   PPD Test 02/29/2016     Current Outpatient Medications:    acetaminophen (TYLENOL) 325 MG tablet, Take 650 mg by mouth every 4 (four) hours as needed for mild pain or moderate pain., Disp: , Rfl:    albuterol (PROVENTIL HFA) 108 (90 Base) MCG/ACT inhaler, Inhale 2 puffs into the lungs every 6 (six) hours as needed for wheezing or shortness of breath., Disp: 18 g, Rfl: 2   albuterol (PROVENTIL) (2.5 MG/3ML) 0.083% nebulizer solution, Take 3 mLs (2.5 mg total) by nebulization every 6 (six) hours as needed for wheezing or shortness of breath., Disp: 150 mL, Rfl: 1   amLODipine (NORVASC) 2.5 MG tablet, Take 1 tablet (2.5 mg total) by mouth daily., Disp: 90 tablet, Rfl: 0   budesonide-formoterol (SYMBICORT) 80-4.5 MCG/ACT inhaler, Inhale 2 puffs into the lungs 2 (two) times daily., Disp: 1 each, Rfl: 3   Cholecalciferol (VITAMIN D-3) 25 MCG (1000 UT) CAPS, Take 2,000 Units by mouth daily., Disp: , Rfl:    dorzolamide-timolol (COSOPT) 22.3-6.8 MG/ML ophthalmic solution, Place 1 drop into the right eye 2 (two) times daily., Disp: , Rfl:    latanoprost (XALATAN) 0.005 % ophthalmic solution, Place 1 drop into both eyes at bedtime. As directed, Disp: , Rfl:    levothyroxine (SYNTHROID) 150 MCG tablet, Take 1 tablet  (150 mcg total) by mouth daily., Disp: 90 tablet, Rfl: 0   lisinopril (ZESTRIL) 40 MG tablet, TAKE 1 TABLET BY MOUTH  DAILY, Disp: 90 tablet, Rfl: 3   metoprolol tartrate (LOPRESSOR) 25 MG tablet, Take 1 tablet (25 mg total) by mouth 2 (two) times daily., Disp: 180 tablet, Rfl: 0   pantoprazole (PROTONIX) 40 MG tablet, Take 1 tablet (40 mg total) by mouth 2 (two) times daily before a meal., Disp: 180 tablet, Rfl: 0   polyethylene glycol (MIRALAX / GLYCOLAX) 17 g packet, Take 17 g by mouth daily., Disp: 14 each, Rfl: 0   pravastatin (PRAVACHOL) 10 MG tablet, TAKE 1 TABLET BY MOUTH  DAILY, Disp: 90 tablet, Rfl: 3   prednisoLONE acetate (PRED FORTE) 1 % ophthalmic suspension, Place 1 drop into both eyes every 4 (four) hours., Disp: 5 mL, Rfl: 0   senna-docusate (SENOKOT-S) 8.6-50 MG tablet, Take 2 tablets by mouth 2 (two) times daily., Disp: 60 tablet, Rfl: 0   warfarin (COUMADIN) 5 MG tablet, Take 1 tablet (5 mg total) by mouth daily. TAKE 1 TABLET BY MOUTH ONCE DAILY OR AS DIRECTED BY ANTICOAGULATION CLINIC, Disp: 90 tablet, Rfl: 1

## 2022-04-06 NOTE — Patient Instructions (Signed)
Provoked submassive pulmonary embolism: Repeat echocardiogram now Continue warfarin as you are doing Plan for warfarin through March 2024  Asthma: Continue Symbicort 2 puffs twice a day Continue albuterol as needed for chest tightness wheezing or shortness of breath Pulmonary function test next visit  We will see you back in March as previously arranged or sooner if needed

## 2022-04-13 DIAGNOSIS — Z7901 Long term (current) use of anticoagulants: Secondary | ICD-10-CM | POA: Diagnosis not present

## 2022-04-13 DIAGNOSIS — Z8616 Personal history of COVID-19: Secondary | ICD-10-CM | POA: Diagnosis not present

## 2022-04-13 DIAGNOSIS — I2699 Other pulmonary embolism without acute cor pulmonale: Secondary | ICD-10-CM | POA: Diagnosis not present

## 2022-04-13 DIAGNOSIS — I130 Hypertensive heart and chronic kidney disease with heart failure and stage 1 through stage 4 chronic kidney disease, or unspecified chronic kidney disease: Secondary | ICD-10-CM | POA: Diagnosis not present

## 2022-04-13 DIAGNOSIS — E039 Hypothyroidism, unspecified: Secondary | ICD-10-CM | POA: Diagnosis not present

## 2022-04-13 DIAGNOSIS — I82452 Acute embolism and thrombosis of left peroneal vein: Secondary | ICD-10-CM | POA: Diagnosis not present

## 2022-04-13 DIAGNOSIS — K219 Gastro-esophageal reflux disease without esophagitis: Secondary | ICD-10-CM | POA: Diagnosis not present

## 2022-04-13 DIAGNOSIS — G3184 Mild cognitive impairment, so stated: Secondary | ICD-10-CM | POA: Diagnosis not present

## 2022-04-13 DIAGNOSIS — Z87440 Personal history of urinary (tract) infections: Secondary | ICD-10-CM | POA: Diagnosis not present

## 2022-04-13 DIAGNOSIS — I82432 Acute embolism and thrombosis of left popliteal vein: Secondary | ICD-10-CM | POA: Diagnosis not present

## 2022-04-13 DIAGNOSIS — I509 Heart failure, unspecified: Secondary | ICD-10-CM | POA: Diagnosis not present

## 2022-04-13 DIAGNOSIS — N183 Chronic kidney disease, stage 3 unspecified: Secondary | ICD-10-CM | POA: Diagnosis not present

## 2022-04-14 ENCOUNTER — Telehealth: Payer: Self-pay | Admitting: Physician Assistant

## 2022-04-14 ENCOUNTER — Ambulatory Visit (HOSPITAL_COMMUNITY)
Admission: RE | Admit: 2022-04-14 | Discharge: 2022-04-14 | Disposition: A | Discharge status: Home / Self Care | Payer: Medicare Other | Source: Ambulatory Visit | Attending: Pulmonary Disease | Admitting: Pulmonary Disease

## 2022-04-14 DIAGNOSIS — R0602 Shortness of breath: Secondary | ICD-10-CM | POA: Diagnosis not present

## 2022-04-14 DIAGNOSIS — I2699 Other pulmonary embolism without acute cor pulmonale: Secondary | ICD-10-CM | POA: Diagnosis not present

## 2022-04-14 DIAGNOSIS — Z8616 Personal history of COVID-19: Secondary | ICD-10-CM | POA: Diagnosis not present

## 2022-04-14 DIAGNOSIS — Z87891 Personal history of nicotine dependence: Secondary | ICD-10-CM | POA: Insufficient documentation

## 2022-04-14 DIAGNOSIS — I10 Essential (primary) hypertension: Secondary | ICD-10-CM | POA: Insufficient documentation

## 2022-04-14 LAB — ECHOCARDIOGRAM COMPLETE
Area-P 1/2: 2.22 cm2
Calc EF: 62 %
MV M vel: 3.66 m/s
MV Peak grad: 53.6 mmHg
MV VTI: 1.89 cm2
S' Lateral: 2.9 cm
Single Plane A2C EF: 58.3 %
Single Plane A4C EF: 62.7 %

## 2022-04-14 NOTE — Progress Notes (Signed)
  Echocardiogram 2D Echocardiogram has been performed.  Kari Coleman 04/14/2022, 11:03 AM

## 2022-04-14 NOTE — Telephone Encounter (Signed)
..  Home Health Certification or Plan of Care Tracking  Is this a Certification or Plan of Care? POC  [place] Agency:[facility] [place]  Order Number:  [id number]  Has charge sheet been attached? Yes   Where has form been placed:  [place],Samantha   Faxed to:   [phone]

## 2022-04-17 DIAGNOSIS — N183 Chronic kidney disease, stage 3 unspecified: Secondary | ICD-10-CM | POA: Diagnosis not present

## 2022-04-17 DIAGNOSIS — Z8616 Personal history of COVID-19: Secondary | ICD-10-CM | POA: Diagnosis not present

## 2022-04-17 DIAGNOSIS — I509 Heart failure, unspecified: Secondary | ICD-10-CM | POA: Diagnosis not present

## 2022-04-17 DIAGNOSIS — E039 Hypothyroidism, unspecified: Secondary | ICD-10-CM | POA: Diagnosis not present

## 2022-04-17 DIAGNOSIS — I82452 Acute embolism and thrombosis of left peroneal vein: Secondary | ICD-10-CM | POA: Diagnosis not present

## 2022-04-17 DIAGNOSIS — G3184 Mild cognitive impairment, so stated: Secondary | ICD-10-CM | POA: Diagnosis not present

## 2022-04-17 DIAGNOSIS — Z7901 Long term (current) use of anticoagulants: Secondary | ICD-10-CM | POA: Diagnosis not present

## 2022-04-17 DIAGNOSIS — Z87440 Personal history of urinary (tract) infections: Secondary | ICD-10-CM | POA: Diagnosis not present

## 2022-04-17 DIAGNOSIS — K219 Gastro-esophageal reflux disease without esophagitis: Secondary | ICD-10-CM | POA: Diagnosis not present

## 2022-04-17 DIAGNOSIS — I2699 Other pulmonary embolism without acute cor pulmonale: Secondary | ICD-10-CM | POA: Diagnosis not present

## 2022-04-17 DIAGNOSIS — I82432 Acute embolism and thrombosis of left popliteal vein: Secondary | ICD-10-CM | POA: Diagnosis not present

## 2022-04-17 DIAGNOSIS — I130 Hypertensive heart and chronic kidney disease with heart failure and stage 1 through stage 4 chronic kidney disease, or unspecified chronic kidney disease: Secondary | ICD-10-CM | POA: Diagnosis not present

## 2022-04-17 NOTE — Telephone Encounter (Signed)
Forms signed and faxed

## 2022-04-20 ENCOUNTER — Other Ambulatory Visit: Payer: Self-pay

## 2022-04-20 DIAGNOSIS — R0602 Shortness of breath: Secondary | ICD-10-CM

## 2022-04-24 ENCOUNTER — Ambulatory Visit
Admission: RE | Admit: 2022-04-24 | Discharge: 2022-04-24 | Disposition: A | Discharge status: Home / Self Care | Payer: Medicare Other | Source: Ambulatory Visit | Attending: Pulmonary Disease | Admitting: Pulmonary Disease

## 2022-04-24 DIAGNOSIS — R918 Other nonspecific abnormal finding of lung field: Secondary | ICD-10-CM | POA: Diagnosis not present

## 2022-04-24 DIAGNOSIS — J439 Emphysema, unspecified: Secondary | ICD-10-CM | POA: Diagnosis not present

## 2022-04-24 DIAGNOSIS — R0602 Shortness of breath: Secondary | ICD-10-CM

## 2022-04-24 DIAGNOSIS — R0600 Dyspnea, unspecified: Secondary | ICD-10-CM | POA: Diagnosis not present

## 2022-04-24 DIAGNOSIS — K760 Fatty (change of) liver, not elsewhere classified: Secondary | ICD-10-CM | POA: Diagnosis not present

## 2022-04-24 DIAGNOSIS — I700 Atherosclerosis of aorta: Secondary | ICD-10-CM | POA: Diagnosis not present

## 2022-04-24 MED ORDER — IOPAMIDOL (ISOVUE-370) INJECTION 76%
75.0000 mL | Freq: Once | INTRAVENOUS | Status: AC | PRN
  Administered 2022-04-24: 75 mL via INTRAVENOUS

## 2022-04-25 DIAGNOSIS — Z87440 Personal history of urinary (tract) infections: Secondary | ICD-10-CM | POA: Diagnosis not present

## 2022-04-25 DIAGNOSIS — K219 Gastro-esophageal reflux disease without esophagitis: Secondary | ICD-10-CM | POA: Diagnosis not present

## 2022-04-25 DIAGNOSIS — I509 Heart failure, unspecified: Secondary | ICD-10-CM | POA: Diagnosis not present

## 2022-04-25 DIAGNOSIS — Z7901 Long term (current) use of anticoagulants: Secondary | ICD-10-CM | POA: Diagnosis not present

## 2022-04-25 DIAGNOSIS — Z8616 Personal history of COVID-19: Secondary | ICD-10-CM | POA: Diagnosis not present

## 2022-04-25 DIAGNOSIS — I130 Hypertensive heart and chronic kidney disease with heart failure and stage 1 through stage 4 chronic kidney disease, or unspecified chronic kidney disease: Secondary | ICD-10-CM | POA: Diagnosis not present

## 2022-04-25 DIAGNOSIS — I2699 Other pulmonary embolism without acute cor pulmonale: Secondary | ICD-10-CM | POA: Diagnosis not present

## 2022-04-25 DIAGNOSIS — N183 Chronic kidney disease, stage 3 unspecified: Secondary | ICD-10-CM | POA: Diagnosis not present

## 2022-04-25 DIAGNOSIS — G3184 Mild cognitive impairment, so stated: Secondary | ICD-10-CM | POA: Diagnosis not present

## 2022-04-25 DIAGNOSIS — I82452 Acute embolism and thrombosis of left peroneal vein: Secondary | ICD-10-CM | POA: Diagnosis not present

## 2022-04-25 DIAGNOSIS — I82432 Acute embolism and thrombosis of left popliteal vein: Secondary | ICD-10-CM | POA: Diagnosis not present

## 2022-04-25 DIAGNOSIS — E039 Hypothyroidism, unspecified: Secondary | ICD-10-CM | POA: Diagnosis not present

## 2022-04-26 ENCOUNTER — Ambulatory Visit (INDEPENDENT_AMBULATORY_CARE_PROVIDER_SITE_OTHER): Payer: Medicare Other | Admitting: Pulmonary Disease

## 2022-04-26 DIAGNOSIS — J45909 Unspecified asthma, uncomplicated: Secondary | ICD-10-CM

## 2022-04-26 LAB — PULMONARY FUNCTION TEST
DL/VA % pred: 128 %
DL/VA: 5.18 ml/min/mmHg/L
DLCO cor % pred: 68 %
DLCO cor: 14.56 ml/min/mmHg
DLCO unc % pred: 68 %
DLCO unc: 14.56 ml/min/mmHg
FEF 25-75 Post: 1.66 L/sec
FEF 25-75 Pre: 0.89 L/sec
FEF2575-%Change-Post: 85 %
FEF2575-%Pred-Post: 85 %
FEF2575-%Pred-Pre: 46 %
FEV1-%Change-Post: 17 %
FEV1-%Pred-Post: 51 %
FEV1-%Pred-Pre: 43 %
FEV1-Post: 1.26 L
FEV1-Pre: 1.07 L
FEV1FVC-%Change-Post: 5 %
FEV1FVC-%Pred-Pre: 103 %
FEV6-%Change-Post: 11 %
FEV6-%Pred-Post: 48 %
FEV6-%Pred-Pre: 44 %
FEV6-Post: 1.53 L
FEV6-Pre: 1.37 L
FEV6FVC-%Pred-Post: 104 %
FEV6FVC-%Pred-Pre: 104 %
FVC-%Change-Post: 11 %
FVC-%Pred-Post: 46 %
FVC-%Pred-Pre: 42 %
FVC-Post: 1.53 L
FVC-Pre: 1.37 L
Post FEV1/FVC ratio: 83 %
Post FEV6/FVC ratio: 100 %
Pre FEV1/FVC ratio: 78 %
Pre FEV6/FVC Ratio: 100 %
RV % pred: 62 %
RV: 1.5 L
TLC % pred: 60 %
TLC: 3.35 L

## 2022-04-26 NOTE — Progress Notes (Signed)
Full PFT performed today.

## 2022-04-26 NOTE — Patient Instructions (Signed)
Full PFT performed today.

## 2022-05-02 DIAGNOSIS — G3184 Mild cognitive impairment, so stated: Secondary | ICD-10-CM | POA: Diagnosis not present

## 2022-05-02 DIAGNOSIS — E039 Hypothyroidism, unspecified: Secondary | ICD-10-CM | POA: Diagnosis not present

## 2022-05-02 DIAGNOSIS — Z8616 Personal history of COVID-19: Secondary | ICD-10-CM | POA: Diagnosis not present

## 2022-05-02 DIAGNOSIS — Z87440 Personal history of urinary (tract) infections: Secondary | ICD-10-CM | POA: Diagnosis not present

## 2022-05-02 DIAGNOSIS — I82432 Acute embolism and thrombosis of left popliteal vein: Secondary | ICD-10-CM | POA: Diagnosis not present

## 2022-05-02 DIAGNOSIS — I130 Hypertensive heart and chronic kidney disease with heart failure and stage 1 through stage 4 chronic kidney disease, or unspecified chronic kidney disease: Secondary | ICD-10-CM | POA: Diagnosis not present

## 2022-05-02 DIAGNOSIS — N183 Chronic kidney disease, stage 3 unspecified: Secondary | ICD-10-CM | POA: Diagnosis not present

## 2022-05-02 DIAGNOSIS — I509 Heart failure, unspecified: Secondary | ICD-10-CM | POA: Diagnosis not present

## 2022-05-02 DIAGNOSIS — K219 Gastro-esophageal reflux disease without esophagitis: Secondary | ICD-10-CM | POA: Diagnosis not present

## 2022-05-02 DIAGNOSIS — M1990 Unspecified osteoarthritis, unspecified site: Secondary | ICD-10-CM | POA: Diagnosis not present

## 2022-05-02 DIAGNOSIS — I2699 Other pulmonary embolism without acute cor pulmonale: Secondary | ICD-10-CM | POA: Diagnosis not present

## 2022-05-02 DIAGNOSIS — Z7901 Long term (current) use of anticoagulants: Secondary | ICD-10-CM | POA: Diagnosis not present

## 2022-05-02 DIAGNOSIS — I82452 Acute embolism and thrombosis of left peroneal vein: Secondary | ICD-10-CM | POA: Diagnosis not present

## 2022-05-03 ENCOUNTER — Ambulatory Visit (INDEPENDENT_AMBULATORY_CARE_PROVIDER_SITE_OTHER): Payer: Medicare Other

## 2022-05-03 DIAGNOSIS — Z7901 Long term (current) use of anticoagulants: Secondary | ICD-10-CM

## 2022-05-03 LAB — POCT INR: INR: 2.4 (ref 2.0–3.0)

## 2022-05-03 NOTE — Progress Notes (Signed)
Continue to take one 5 mg tablet daily.  Recheck in 4 weeks.

## 2022-05-03 NOTE — Patient Instructions (Addendum)
Pre visit review using our clinic review tool, if applicable. No additional management support is needed unless otherwise documented below in the visit note.  Continue to take one 5 mg tablet daily.  Recheck in 4 weeks.

## 2022-05-16 ENCOUNTER — Telehealth: Payer: Self-pay | Admitting: Physician Assistant

## 2022-05-16 NOTE — Telephone Encounter (Signed)
Patient requests to be called regarding Levothyroxine dosage (specifically Patient has two different dosages -150 MCG and 175 MCG-clarification on which MCG)

## 2022-05-16 NOTE — Telephone Encounter (Signed)
Samantha, please clarify what dosage of Levothyroxine pt is suppose to be on 150 mcg or 175 mcg?

## 2022-05-17 ENCOUNTER — Other Ambulatory Visit: Payer: Self-pay | Admitting: Physician Assistant

## 2022-05-17 MED ORDER — AMLODIPINE BESYLATE 2.5 MG PO TABS: 2.5000 mg | ORAL_TABLET | Freq: Every day | ORAL | 1 refills | Status: DC

## 2022-05-17 MED ORDER — LEVOTHYROXINE SODIUM 150 MCG PO TABS: 150.0000 ug | ORAL_TABLET | Freq: Every day | ORAL | 0 refills | Status: DC

## 2022-05-17 NOTE — Telephone Encounter (Signed)
Pt called back told her [name] said you are suppose to be taking Levothyroxine 150 mcg daily. Pt verbalized understanding and said she only has one pill left. Told her okay I will send Rx to pharmacy, but I am only sending a 30 day supply cause you have an upcoming appt with Endo and they will take over the medication. Pt verbalized understanding.

## 2022-05-17 NOTE — Addendum Note (Signed)
Addended by: Marian Sorrow on: 05/17/2022 10:46 AM   Modules accepted: Orders

## 2022-05-17 NOTE — Telephone Encounter (Signed)
Tried to contact pt, but voicemail box is full unable to leave message. Will try again later.

## 2022-05-18 DIAGNOSIS — G3184 Mild cognitive impairment, so stated: Secondary | ICD-10-CM | POA: Diagnosis not present

## 2022-05-18 DIAGNOSIS — I2699 Other pulmonary embolism without acute cor pulmonale: Secondary | ICD-10-CM | POA: Diagnosis not present

## 2022-05-18 DIAGNOSIS — N183 Chronic kidney disease, stage 3 unspecified: Secondary | ICD-10-CM | POA: Diagnosis not present

## 2022-05-18 DIAGNOSIS — E039 Hypothyroidism, unspecified: Secondary | ICD-10-CM | POA: Diagnosis not present

## 2022-05-18 DIAGNOSIS — I82432 Acute embolism and thrombosis of left popliteal vein: Secondary | ICD-10-CM | POA: Diagnosis not present

## 2022-05-18 DIAGNOSIS — I130 Hypertensive heart and chronic kidney disease with heart failure and stage 1 through stage 4 chronic kidney disease, or unspecified chronic kidney disease: Secondary | ICD-10-CM | POA: Diagnosis not present

## 2022-05-18 DIAGNOSIS — Z7901 Long term (current) use of anticoagulants: Secondary | ICD-10-CM | POA: Diagnosis not present

## 2022-05-18 DIAGNOSIS — K219 Gastro-esophageal reflux disease without esophagitis: Secondary | ICD-10-CM | POA: Diagnosis not present

## 2022-05-18 DIAGNOSIS — Z87440 Personal history of urinary (tract) infections: Secondary | ICD-10-CM | POA: Diagnosis not present

## 2022-05-18 DIAGNOSIS — I82452 Acute embolism and thrombosis of left peroneal vein: Secondary | ICD-10-CM | POA: Diagnosis not present

## 2022-05-18 DIAGNOSIS — Z8616 Personal history of COVID-19: Secondary | ICD-10-CM | POA: Diagnosis not present

## 2022-05-18 DIAGNOSIS — I509 Heart failure, unspecified: Secondary | ICD-10-CM | POA: Diagnosis not present

## 2022-05-22 ENCOUNTER — Other Ambulatory Visit: Payer: Self-pay

## 2022-05-22 ENCOUNTER — Telehealth: Payer: Self-pay

## 2022-05-22 DIAGNOSIS — Z7901 Long term (current) use of anticoagulants: Secondary | ICD-10-CM

## 2022-05-22 MED ORDER — WARFARIN SODIUM 5 MG PO TABS: ORAL_TABLET | ORAL | 1 refills | Status: DC

## 2022-05-22 NOTE — Telephone Encounter (Signed)
Pt LVM reporting she needs a refill of warfarin, has  about 3-4 days left. Pt did not leave preferred pharmacy.  Tried to contact pt but no answer and VM is full.   Warfarin was last sent to [facility]. Sent in refill to that pharmacy.

## 2022-05-23 NOTE — Telephone Encounter (Signed)
Pt returned call. Advised script was sent. Pt verbalized understanding.

## 2022-05-25 ENCOUNTER — Telehealth: Payer: Self-pay | Admitting: Pulmonary Disease

## 2022-05-26 NOTE — Telephone Encounter (Signed)
Went over patients CT results and got her f/u scheduled with Dr. Lake Bells. Nothing further needed at this time.

## 2022-05-31 ENCOUNTER — Ambulatory Visit (INDEPENDENT_AMBULATORY_CARE_PROVIDER_SITE_OTHER): Payer: Medicare Other

## 2022-05-31 DIAGNOSIS — Z7901 Long term (current) use of anticoagulants: Secondary | ICD-10-CM

## 2022-05-31 LAB — POCT INR: INR: 1.8 — AB (ref 2.0–3.0)

## 2022-05-31 NOTE — Patient Instructions (Addendum)
Pre visit review using our clinic review tool, if applicable. No additional management support is needed unless otherwise documented below in the visit note.  Increase dose today to take 7.5 mg and the continue to take one 5 mg tablet daily.  Recheck in 3 weeks.

## 2022-05-31 NOTE — Progress Notes (Signed)
Increase dose today to take 7.5 mg and the continue to take one 5 mg tablet daily.  Recheck in 3 weeks.

## 2022-06-07 ENCOUNTER — Ambulatory Visit: Payer: Medicare Other | Admitting: Pulmonary Disease

## 2022-06-07 ENCOUNTER — Encounter: Payer: Self-pay | Admitting: Pulmonary Disease

## 2022-06-07 ENCOUNTER — Ambulatory Visit (INDEPENDENT_AMBULATORY_CARE_PROVIDER_SITE_OTHER): Payer: Medicare Other | Admitting: Pulmonary Disease

## 2022-06-07 VITALS — BP 160/84 | HR 57 | Ht 67.0 in | Wt 208.0 lb

## 2022-06-07 DIAGNOSIS — I2699 Other pulmonary embolism without acute cor pulmonale: Secondary | ICD-10-CM

## 2022-06-07 DIAGNOSIS — R0602 Shortness of breath: Secondary | ICD-10-CM | POA: Diagnosis not present

## 2022-06-07 NOTE — Patient Instructions (Signed)
  Your CAT scan looks better The blood clot has melted off  Your most recent echocardiogram looks fine as well  Stop taking Coumadin at the end of this month  I will see you back in about 6 to 8 weeks  Graded exercises as tolerated  Make sure you are using your inhaler regularly

## 2022-06-07 NOTE — Progress Notes (Signed)
Kari Coleman    [id number]    1950/11/17  Primary Care Physician:W[place]ley, [name], [place]  Referring Physician: Inda Coke, [place],  [place] [address]  Chief complaint:   Patient being seen for shortness of breath  HPI:  History of large pulmonary embolus for which she has been on anticoagulation  She is feeling relatively well  She is short of breath with exertion Chronic knee pain, sometimes unsteady on her feet so she uses a cane, sometimes uses a walker  No chest pains or chest discomfort  She does have a history of asthma for which she is on inhalers Unable to afford Symbicort so only uses albuterol  Inhaler technique was reviewed in the office today  She was accompanied by family member and we did review a CT scan -Most recent CT scan of the chest shows resolution of the blood clot  -Most recent echocardiogram was reviewed as well   Pets: Occupation: Exposures: Smoking history: Travel history: Relevant family history:  Outpatient Encounter Medications as of 06/07/2022  Medication Sig   acetaminophen (TYLENOL) 325 MG tablet Take 650 mg by mouth every 4 (four) hours as needed for mild pain or moderate pain.   albuterol (PROVENTIL HFA) 108 (90 Base) MCG/ACT inhaler Inhale 2 puffs into the lungs every 6 (six) hours as needed for wheezing or shortness of breath.   albuterol (PROVENTIL) (2.5 MG/3ML) 0.083% nebulizer solution Take 3 mLs (2.5 mg total) by nebulization every 6 (six) hours as needed for wheezing or shortness of breath.   amLODipine (NORVASC) 2.5 MG tablet Take 1 tablet (2.5 mg total) by mouth daily.   budesonide-formoterol (SYMBICORT) 80-4.5 MCG/ACT inhaler Inhale 2 puffs into the lungs 2 (two) times daily.   Cholecalciferol (VITAMIN D-3) 25 MCG (1000 UT) CAPS Take 2,000 Units by mouth daily.   dorzolamide-timolol (COSOPT) 22.3-6.8 MG/ML ophthalmic solution Place 1 drop into the right eye 2 (two) times daily.    latanoprost (XALATAN) 0.005 % ophthalmic solution Place 1 drop into both eyes at bedtime. As directed   levothyroxine (SYNTHROID) 150 MCG tablet Take 1 tablet (150 mcg total) by mouth daily.   lisinopril (ZESTRIL) 40 MG tablet TAKE 1 TABLET BY MOUTH  DAILY   metoprolol tartrate (LOPRESSOR) 25 MG tablet Take 1 tablet by mouth twice daily   pantoprazole (PROTONIX) 40 MG tablet Take 1 tablet (40 mg total) by mouth 2 (two) times daily before a meal.   polyethylene glycol (MIRALAX / GLYCOLAX) 17 g packet Take 17 g by mouth daily.   pravastatin (PRAVACHOL) 10 MG tablet TAKE 1 TABLET BY MOUTH DAILY   senna-docusate (SENOKOT-S) 8.6-50 MG tablet Take 2 tablets by mouth 2 (two) times daily.   warfarin (COUMADIN) 5 MG tablet TAKE 1 TABLET BY MOUTH DAILY OR AS DIRECTED BY ANTICOAGULATION CLINIC   prednisoLONE acetate (PRED FORTE) 1 % ophthalmic suspension Place 1 drop into both eyes every 4 (four) hours. (Patient not taking: Reported on 06/07/2022)   No facility-administered encounter medications on file as of 06/07/2022.    Allergies as of 06/07/2022 - Review Complete 06/07/2022  Allergen Reaction Noted   Doxycycline Nausea Only 06/11/2017   Codeine Nausea And Vomiting 03/08/2013   Pollen extract Other (See Comments)     Past Medical History:  Diagnosis Date   Allergy    Arthritis    Cataract    Cervical cancer ([place])    GERD (gastroesophageal reflux disease)    Glaucoma  2017   History of echocardiogram    Echo 9/16:  Mild LVH, EF 55-60%, no RWMA, Gr 1 DD, mild MR // Echo 1/17:  Mild LVH, EF 50-55%, Gr 1 DD, mild to mod MR, mild LAE   Hypertension    Hyperthyroidism    s/p RAI treatment   Palpitations    PONV (postoperative nausea and vomiting)    years ago  . Woke up and saw asian people    PONV (postoperative nausea and vomiting) 07/18/2021   Retinal detachment    L eye - partial blindness   Tobacco abuse    Tuberculosis    positive test as a caregiver cxr annually    Urinary incontinence      Past Surgical History:  Procedure Laterality Date   ABDOMINAL HYSTERECTOMY  1984   bladder surgey for incontinence     EYE SURGERY     REPAIR OF COMPLEX TRACTION RETINAL DETACHMENT     thyroid radiation     TOTAL ABDOMINAL HYSTERECTOMY W/ BILATERAL SALPINGOOPHORECTOMY     TOTAL KNEE ARTHROPLASTY Right 04/22/2019   Procedure: RIGHT TOTAL KNEE ARTHROPLASTY;  Surgeon: Melrose Nakayama, MD;  Location: [facility];  Service: Orthopedics;  Laterality: Right;  PENDING PRE-OP FOR SDDC APPROVAL    Family History  Problem Relation Age of Onset   Hypertension Mother    Heart failure Mother    Diabetes Mother    Pancreatic cancer Father    Colon cancer Maternal Aunt    Cancer Sister    Thyroid disease Paternal Aunt    Cancer Paternal Aunt    Thyroid disease Other        paternal side   Cancer Maternal Grandfather    Diabetes Paternal Grandmother    Diabetes Maternal Uncle    Colon polyps Neg Hx    Stomach cancer Neg Hx     Social History   Socioeconomic History   Marital status: Single    Spouse name: Not on file   Number of children: 2   Years of education: Not on file   Highest education level: Not on file  Occupational History   Occupation: CNA    Comment: [employer]   Occupation: Retired  Tobacco Use   Smoking status: Former    Packs/day: 0.50    Years: 48.00    Total pack years: 24.00    Types: Cigarettes    Start date: 11/30/1968    Quit date: 10/31/2016    Years since quitting: 5.6   Smokeless tobacco: Never   Tobacco comments:    did not smoke more than .5 pack   Vaping Use   Vaping Use: Never used  Substance and Sexual Activity   Alcohol use: Not Currently    Alcohol/week: 0.0 standard drinks of alcohol    Comment: rarely once a year   Drug use: No   Sexual activity: Not Currently  Other Topics Concern   Not on file  Social History Narrative   Caregiver   Daughter and grandson in household   Social Determinants of Health   Financial Resource  Strain: Low Risk  (12/12/2021)   Overall Financial Resource Strain (CARDIA)    Difficulty of Paying Living Expenses: Not hard at all  Food Insecurity: No Food Insecurity (12/26/2021)   Hunger Vital Sign    Worried About Running Out of Food in the Last Year: Never true    Ran Out of Food in the Last Year: Never true  Transportation Needs: No Transportation  Needs (12/26/2021)   PRAPARE - Hydrologist (Medical): No    Lack of Transportation (Non-Medical): No  Physical Activity: Inactive (12/12/2021)   Exercise Vital Sign    Days of Exercise per Week: 0 days    Minutes of Exercise per Session: 0 min  Stress: No Stress Concern Present (12/12/2021)   [place]    Feeling of Stress : Not at all  Social Connections: Moderately Isolated (12/12/2021)   Social Connection and Isolation Panel [NHANES]    Frequency of Communication with Friends and Family: More than three times a week    Frequency of Social Gatherings with Friends and Family: Twice a week    Attends Religious Services: 1 to 4 times per year    Active Member of [employer] or Organizations: No    Attends Archivist Meetings: Never    Marital Status: Never married  Intimate Partner Violence: Not At Risk (12/26/2021)   Humiliation, Afraid, Rape, and Kick questionnaire    Fear of Current or Ex-Partner: No    Emotionally Abused: No    Physically Abused: No    Sexually Abused: No    Review of Systems  Respiratory:  Positive for shortness of breath.     Vitals:   06/07/22 1540  BP: (!) 160/84  Pulse: (!) 57  SpO2: 97%     Physical Exam Constitutional:      Appearance: She is obese.  HENT:     Head: Normocephalic.     Nose: Nose normal.  Eyes:     General: No scleral icterus.    Pupils: Pupils are equal, round, and reactive to light.  Cardiovascular:     Rate and Rhythm: Normal rate and regular rhythm.     Heart sounds: No  murmur heard.    No friction rub.  Pulmonary:     Effort: No respiratory distress.     Breath sounds: No stridor. No wheezing or rhonchi.  Musculoskeletal:     Cervical back: No rigidity or tenderness.  Neurological:     Mental Status: She is alert.  Psychiatric:        Mood and Affect: Mood normal.    Inhaler technique was reviewed with her today -She does have a spacer that she uses with the inhaler  Data Reviewed: Echocardiogram reviewed showing diastolic dysfunction, no mention of pulmonary hypertension Previous echo from 2023 reviewed as well  Most recent CT scan compared with previous showing resolution of clot  Assessment:  History of pulmonary embolism -Has resolved on current CT -Echocardiogram not showing evidence of pulmonary hypertension  Deconditioning  Shortness of breath  History of asthma  History of diastolic dysfunction  Plan/Recommendations: Warfarin can be stopped at the end of this month-March 2024  Encouraged to use inhalers  Will benefit from Symbicort-states she is not able to afford it  Inhaler technique was taught in the office today  Can consider PFT at next visit  Encouraged to attend graded exercises as tolerated  She does have a nebulizer to use as needed   Sherrilyn Rist MD [facility] 06/07/2022, 4:05 PM  CC: Inda Coke, PA

## 2022-06-09 ENCOUNTER — Encounter: Payer: Self-pay | Admitting: Internal Medicine

## 2022-06-09 ENCOUNTER — Ambulatory Visit: Payer: Medicare Other | Admitting: Internal Medicine

## 2022-06-09 VITALS — BP 160/100 | HR 69 | Ht 67.0 in | Wt 208.6 lb

## 2022-06-09 DIAGNOSIS — E042 Nontoxic multinodular goiter: Secondary | ICD-10-CM | POA: Diagnosis not present

## 2022-06-09 DIAGNOSIS — E890 Postprocedural hypothyroidism: Secondary | ICD-10-CM | POA: Diagnosis not present

## 2022-06-09 LAB — TSH: TSH: <0.01 u[IU]/mL — ABNORMAL LOW (ref 0.35–5.50)

## 2022-06-09 LAB — T4, FREE: Free T4: 1.51 ng/dL (ref 0.60–1.60)

## 2022-06-09 MED ORDER — LEVOTHYROXINE SODIUM 125 MCG PO TABS: 125.0000 ug | ORAL_TABLET | Freq: Every day | ORAL | 3 refills | Status: DC

## 2022-06-09 NOTE — Progress Notes (Signed)
Patient ID: THEREAS Kari Coleman, female   DOB: 1950-09-13, 72 y.o.   MRN: [id number]   HPI  Kari Coleman is a 72 y.o.-year-old female, returning for follow-up for post ablative hypothyroidism for left toxic adenoma and multinodular goiter.  She previously saw Dr. Loanne Drilling, last visit 1 year and 5 months ago.  She is here with her grandson, but history is given entirely by the patient.  Since last visit with Dr. Loanne Drilling, she had [place] in 11/2021 and then PE in 12/2021.  She recovered but not quite at baseline.  Pt. has been dx with a toxic multinodular goiter in 2013.  On the thyroid uptake and scan, she had a left toxic adenoma.  She had RAI treatment in 07/2018 and was started on levothyroxine subsequently.  Pt. is on levothyroxine 150 mcg daily, taken: - in am - fasting - at least 30 min from b'fast - no calcium - no iron - no multivitamins - + Protonix ~1h after LT4 - not on Biotin On Vitamin D.  I reviewed pt's thyroid tests: Lab Results  Component Value Date   TSH 0.99 08/12/2021   TSH 1.30 01/26/2021   TSH 10.12 (H) 08/26/2020   TSH 7.88 (H) 05/27/2020   TSH 5.66 (H) 01/22/2020   TSH 5.13 (H) 10/21/2019   TSH 8.05 (H) 07/11/2019   TSH 2.92 04/14/2019   TSH 4.68 (H) 01/21/2019   TSH 2.97 12/23/2018   FREET4 1.05 01/26/2021   FREET4 0.52 (L) 08/26/2020   FREET4 0.80 05/27/2020   FREET4 0.61 01/22/2020   FREET4 0.75 10/21/2019   FREET4 0.58 (L) 07/11/2019   FREET4 0.86 04/14/2019   FREET4 0.49 (L) 01/21/2019   FREET4 0.61 12/23/2018   FREET4 0.51 (L) 11/22/2018   T3FREE 5.1 (H) 06/11/2017   Antithyroid antibodies: No results found for: "THGAB" No components found for: "TPOAB"  Thyroid U/S (03/26/2014): There is marked diffuse heterogeneity of thyroid parenchymal echotexture   Right thyroid lobe.: Normal in size measuring 4.7 x 2.4 x 2.5 cm.   Right, mid - 2.5 x 1.5 x 1.8 cm - mixed echogenic, partially cystic partially solid, slightly ill-defined, possibly a pseudo  nodule.  There are innumerable additional punctate sub cm mixed echogenic though predominantly cystic nodules scattered throughout the remainder of the right lobe of the thyroid.   Left thyroid lobe: Borderline enlarged measuring 6.1 x 2.5 x 2.4 cm.   Left, inferior, anterior - 1.9 x 1.3 x 1.9 cm - anechoic, likely cystic.  *Left, superior, anterior - 2.2 x 1.2 x 1.6 cm - mixed echogenic, partially cystic, predominantly solid.  There are innumerable additional punctate sub cm mixed echogenic though predominantly cystic nodules scattered throughout the remainder of the left lobe of the thyroid.   Isthmus Thickness: Enlarged measuring 0.5 cm in diameter.   Mid, anterior - 0.5 cm - hypoechoic, likely cystic.  There are 2 additional < 3 mm anechoic cysts within the thyroid isthmus.   Lymphadenopathy: None visualized.   IMPRESSION: Findings compatible with multi nodular goiter. The dominant approximately 2.5 cm nodule within the mid aspect of the right lobe of the thyroid as well as the dominant approximately 2.2 cm nodule within the superior, anterior aspect the left lobe of the thyroid both meet imaging criteria to recommend percutaneous sampling as clinically indicated.   Thyroid FNA (06/16/2014): Adequacy Reason Satisfactory For Evaluation. Diagnosis THYROID, FINE NEEDLE ASPIRATION, LEFT LOBE LUP, ANTERIOR, SUPERIOR (SPECIMEN 1 OF 2 COLLECTED ON 06/16/14): ATYPIA. (BETHESDA CATEGORY III). SEE COMMENT.  Claudette Laws MD Pathologist, Electronic Signature (Case signed 06/17/2014) Specimen Clinical Information Left, Superior, Anterior - 2.2 x 1.2 x 1.6 cm - mixed echogenic, Partially cystic, Predominantly solid Source Thyroid, Fine Needle Aspiration, Left Lobe LUP, Anterior, Superior, (Specimen 1 of 2, collected on 06/16/14 )  Adequacy Reason Satisfactory For Evaluation. Diagnosis THYROID, FINE NEEDLE ASPIRATION, RIGHT LOBE RMP (SPECIMEN 2 OF 2 COLLECTED 06-16-2014) BENIGN  (BETHESDA CATEGORY II). FINDINGS CONSISTENT WITH BENIGN CYSTIC NODULE. Claudette Laws MD Pathologist, Electronic Signature (Case signed 06/17/2014) Specimen Clinical Information Right, Mid - 2.5 x 1.5 x 1.8 cm - mixed echogenic , Partially cystic partially solid, Slightly ill - defiined, Possibly a pseudo nodule Source Thyroid, Fine Needle Aspiration, Right Lobe RMP, (Specimen 2 of 2, collected on 06/16/14 )  Thyroid uptake and scan (05/16/2018): 4 hour I-123 uptake = 21.5% (normal 5-20%)  24 hour I-123 uptake = 49.8% (normal 10-30%)   IMPRESSION: Elevated 4 hour and 24 hour radio iodine uptakes as above consistent with hyperthyroidism.   When compared to the previous study, the 24 hour radio iodine uptake has increased from the 23.8% on the previous exam.   Homogeneous tracer distribution in both thyroid lobes with suspect mildly hyperfunctional adenoma at the upper pole the LEFT lobe.  RAI tx (07/26/2018)  Thyroid U/S (02/06/2020): Parenchymal Echotexture: Markedly heterogenous  Isthmus: 0.4 cm  Right lobe: 3.7 x 1.4 x 1.1 cm  Left lobe: 3.8 x 1.3 x 1.4 cm  _________________________________________________________   Estimated total number of nodules >/= 1 cm: 2 _________________________________________________________   Nodule # 1: A solid isoechoic nodule in the right upper gland measures 1.3 x 0.9 x 0.6 cm. This nodule does not meet criteria for biopsy or further imaging surveillance. Of note, it is in similar location to the previously biopsied spongiform thyroid nodule and likely represents the involuted remnants of that prior nodule.   Several small nodules are present in the left upper and lower gland. The nodules are either less than 1 cm in size or purely cystic and do not meet criteria for biopsy or further imaging evaluation.   No thyroid nodules of consequence are identified.   IMPRESSION: Small, heterogeneous thyroid gland with multiple small nodules  which do not meet criteria for biopsy or further imaging surveillance. No further follow-up is required.   Pt denies - fatigue - cold intolerance - constipation - hair loss  She has: - weight gain - recently: 8 lbs  Pt denies: - feeling nodules in neck - hoarseness - dysphagia, only occasionally, positionally - choking  She has + FH of thyroid disorders in many family members. No FH of thyroid cancer. No h/o radiation tx to head or neck. No recent use of iodine supplements.  No herbal supplements.  No recent steroid use.  Pt. 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  ROS: + see HPI  Past Medical History:  Diagnosis Date   Allergy    Arthritis    Cataract    Cervical cancer ([place])    GERD (gastroesophageal reflux disease)    Glaucoma 2017   History of echocardiogram    Echo 9/16:  Mild LVH, EF 55-60%, no RWMA, Gr 1 DD, mild MR // Echo 1/17:  Mild LVH, EF 50-55%, Gr 1 DD, mild to mod MR, mild LAE   Hypertension    Hyperthyroidism    s/p RAI treatment   Palpitations    PONV (postoperative nausea  and vomiting)    years ago  . Woke up and saw asian people    PONV (postoperative nausea and vomiting) 07/18/2021   Retinal detachment    L eye - partial blindness   Tobacco abuse    Tuberculosis    positive test as a caregiver cxr annually    Urinary incontinence    Past Surgical History:  Procedure Laterality Date   ABDOMINAL HYSTERECTOMY  1984   bladder surgey for incontinence     EYE SURGERY     REPAIR OF COMPLEX TRACTION RETINAL DETACHMENT     thyroid radiation     TOTAL ABDOMINAL HYSTERECTOMY W/ BILATERAL SALPINGOOPHORECTOMY     TOTAL KNEE ARTHROPLASTY Right 04/22/2019   Procedure: RIGHT TOTAL KNEE ARTHROPLASTY;  Surgeon: Melrose Nakayama, MD;  Location: [facility];  Service: Orthopedics;  Laterality: Right;  PENDING PRE-OP FOR SDDC APPROVAL   Social History    Socioeconomic History   Marital status: Single    Spouse name: Not on file   Number of children: 2   Years of education: Not on file   Highest education level: Not on file  Occupational History   Occupation: CNA    Comment: [employer]   Occupation: Retired  Tobacco Use   Smoking status: Former    Packs/day: 0.50    Years: 48.00    Total pack years: 24.00    Types: Cigarettes    Start date: 11/30/1968    Quit date: 10/31/2016    Years since quitting: 5.6   Smokeless tobacco: Never   Tobacco comments:    did not smoke more than .5 pack   Vaping Use   Vaping Use: Never used  Substance and Sexual Activity   Alcohol use: Not Currently    Alcohol/week: 0.0 standard drinks of alcohol    Comment: rarely once a year   Drug use: No   Sexual activity: Not Currently  Other Topics Concern   Not on file  Social History Narrative   Caregiver   Daughter and grandson in household   Social Determinants of Health   Financial Resource Strain: Low Risk  (12/12/2021)   Overall Financial Resource Strain (CARDIA)    Difficulty of Paying Living Expenses: Not hard at all  Food Insecurity: No Food Insecurity (12/26/2021)   Hunger Vital Sign    Worried About Running Out of Food in the Last Year: Never true    Ran Out of Food in the Last Year: Never true  Transportation Needs: No Transportation Needs (12/26/2021)   PRAPARE - Hydrologist (Medical): No    Lack of Transportation (Non-Medical): No  Physical Activity: Inactive (12/12/2021)   Exercise Vital Sign    Days of Exercise per Week: 0 days    Minutes of Exercise per Session: 0 min  Stress: No Stress Concern Present (12/12/2021)   [place]    Feeling of Stress : Not at all  Social Connections: Moderately Isolated (12/12/2021)   Social Connection and Isolation Panel [NHANES]    Frequency of Communication with Friends and Family: More than  three times a week    Frequency of Social Gatherings with Friends and Family: Twice a week    Attends Religious Services: 1 to 4 times per year    Active Member of [employer] or Organizations: No    Attends Archivist Meetings: Never    Marital Status: Never married  Intimate Partner Violence: Not  At Risk (12/26/2021)   Humiliation, Afraid, Rape, and Kick questionnaire    Fear of Current or Ex-Partner: No    Emotionally Abused: No    Physically Abused: No    Sexually Abused: No   Current Outpatient Medications on File Prior to Visit  Medication Sig Dispense Refill   acetaminophen (TYLENOL) 325 MG tablet Take 650 mg by mouth every 4 (four) hours as needed for mild pain or moderate pain.     albuterol (PROVENTIL HFA) 108 (90 Base) MCG/ACT inhaler Inhale 2 puffs into the lungs every 6 (six) hours as needed for wheezing or shortness of breath. 18 g 2   albuterol (PROVENTIL) (2.5 MG/3ML) 0.083% nebulizer solution Take 3 mLs (2.5 mg total) by nebulization every 6 (six) hours as needed for wheezing or shortness of breath. 150 mL 1   amLODipine (NORVASC) 2.5 MG tablet Take 1 tablet (2.5 mg total) by mouth daily. 90 tablet 1   budesonide-formoterol (SYMBICORT) 80-4.5 MCG/ACT inhaler Inhale 2 puffs into the lungs 2 (two) times daily. 1 each 3   Cholecalciferol (VITAMIN D-3) 25 MCG (1000 UT) CAPS Take 2,000 Units by mouth daily.     dorzolamide-timolol (COSOPT) 22.3-6.8 MG/ML ophthalmic solution Place 1 drop into the right eye 2 (two) times daily.     latanoprost (XALATAN) 0.005 % ophthalmic solution Place 1 drop into both eyes at bedtime. As directed     levothyroxine (SYNTHROID) 150 MCG tablet Take 1 tablet (150 mcg total) by mouth daily. 30 tablet 0   lisinopril (ZESTRIL) 40 MG tablet TAKE 1 TABLET BY MOUTH  DAILY 90 tablet 3   metoprolol tartrate (LOPRESSOR) 25 MG tablet Take 1 tablet by mouth twice daily 180 tablet 1   pantoprazole (PROTONIX) 40 MG tablet Take 1 tablet (40 mg total) by mouth  2 (two) times daily before a meal. 180 tablet 0   polyethylene glycol (MIRALAX / GLYCOLAX) 17 g packet Take 17 g by mouth daily. 14 each 0   pravastatin (PRAVACHOL) 10 MG tablet TAKE 1 TABLET BY MOUTH DAILY 90 tablet 3   prednisoLONE acetate (PRED FORTE) 1 % ophthalmic suspension Place 1 drop into both eyes every 4 (four) hours. (Patient not taking: Reported on 06/07/2022) 5 mL 0   senna-docusate (SENOKOT-S) 8.6-50 MG tablet Take 2 tablets by mouth 2 (two) times daily. 60 tablet 0   warfarin (COUMADIN) 5 MG tablet TAKE 1 TABLET BY MOUTH DAILY OR AS DIRECTED BY ANTICOAGULATION CLINIC 90 tablet 1   No current facility-administered medications on file prior to visit.   Allergies  Allergen Reactions   Doxycycline Nausea Only   Codeine Nausea And Vomiting   Pollen Extract Other (See Comments)    Unknown reaction    Family History  Problem Relation Age of Onset   Hypertension Mother    Heart failure Mother    Diabetes Mother    Pancreatic cancer Father    Colon cancer Maternal Aunt    Cancer Sister    Thyroid disease Paternal Aunt    Cancer Paternal Aunt    Thyroid disease Other        paternal side   Cancer Maternal Grandfather    Diabetes Paternal Grandmother    Diabetes Maternal Uncle    Colon polyps Neg Hx    Stomach cancer Neg Hx    PE: BP (!) 160/100 (BP Location: Left Arm, Patient Position: Sitting, Cuff Size: Normal)   Pulse 69   Ht '5\' 7"'$  (1.702 m)   Wt 208  lb 9.6 oz (94.6 kg)   SpO2 96%   BMI 32.67 kg/m  Wt Readings from Last 3 Encounters:  06/09/22 208 lb 9.6 oz (94.6 kg)  06/07/22 208 lb (94.3 kg)  04/06/22 208 lb (94.3 kg)   Constitutional: overweight, in NAD Eyes:  EOMI, no exophthalmos ENT: no neck masses, thyroid not palpable , no cervical lymphadenopathy Cardiovascular: RRR, No MRG Respiratory: CTA B Musculoskeletal: no deformities Skin:no rashes Neurological: no tremor with outstretched hands  ASSESSMENT: 1.  Post ablative hypothyroidism - after RAI  tx for left toxic adenoma  2.  Multinodular goiter  PLAN:  1. Patient with long-standing hypothyroidism, on levothyroxine therapy. - she appears euthyroid.  - latest thyroid labs reviewed with pt. >> normal: Lab Results  Component Value Date   TSH 0.99 08/12/2021  - she continues on LT4 150 mcg daily - pt feels good on this dose. - we discussed about taking the thyroid hormone every day, with water, >30 minutes before breakfast, separated by >4 hours from acid reflux medications, calcium, iron, multivitamins. Pt. is not taking it correctly -she takes it with other meds and also takes Protonix too close.  I advised her to separate it from the rest of the meds the morning Protonix at least 4 hours later. - will check thyroid tests today: TSH and fT4, but may need to repeat the tests depending on the results. - If labs are abnormal, she will need to return for repeat TFTs in 1.5 months - Otherwise, I will see her back in 6 months  2.  Multinodular goiter - dx. 2013 - she had 2 biopsies in 2016: Thyroid dominant nodule biopsy was benign, while the left was inconclusive (Bethesda category 3) - she had RAI tx 07/2018 - She had a follow-up ultrasound in 2021 and this showed a small thyroid, Regenia's, and with only small thyroid nodules for which no follow-up was recommended - no neck compression symptoms or neck masses palpated today - we discussed that after RAI treatment, usually the thyroid gland and nodules shrink - no need for imaging for now, we will continue to follow her expectantly  Needs refills.  - Total time spent for the visit: 40 min, in precharting, post charting, reviewing Dr. Cordelia Pen last note, obtaining medical information from the chart and from the pt, reviewing her  previous labs, evaluations, and treatments, reviewing her symptoms, counseling her about her thyroid conditions (please see the discussed topics above), and developing a plan to further investigate and treat  them.  Component     Latest Ref Rng 06/09/2022  T4,Free(Direct)     0.60 - 1.60 ng/dL 1.51   TSH     0.35 - 5.50 uIU/mL 0.00 (L)     TSH completely suppressed.  Will need to decrease the dose of LT4 to 125 mcg daily (especially as we are also separating it from the PPI) and recheck her test in 1.5 months.  Philemon Kingdom, MD PhD [facility] Endocrinology

## 2022-06-09 NOTE — Patient Instructions (Addendum)
Please continue Levothyroxine 150 mcg aily.  Take the thyroid hormone every day, with water, at least 30 minutes before breakfast, separated by at least 4 hours from: - acid reflux medications - calcium - iron - multivitamins  Move Protonix >4h after Levothyroxine.  Please stop at the lab.  Please return in 6 months.

## 2022-06-21 ENCOUNTER — Ambulatory Visit (INDEPENDENT_AMBULATORY_CARE_PROVIDER_SITE_OTHER): Payer: Medicare Other

## 2022-06-21 DIAGNOSIS — Z7901 Long term (current) use of anticoagulants: Secondary | ICD-10-CM

## 2022-06-21 LAB — POCT INR: INR: 1.7 — AB (ref 2.0–3.0)

## 2022-06-21 NOTE — Progress Notes (Signed)
Pt was advised by pulmonary physician the can stop taking warfarin at the end of the month. Pt wants INR checked once more before stopping. Pt will be in next week for INR check.   Increase dose today to take 7.5 mg and the change weekly dose to take one 5 mg tablet daily except take 7.5 mg on Sundays.  Recheck in 1 weeks.

## 2022-06-21 NOTE — Patient Instructions (Addendum)
Pre visit review using our clinic review tool, if applicable. No additional management support is needed unless otherwise documented below in the visit note.  Increase dose today to take 7.5 mg and the change weekly dose to take one 5 mg tablet daily except take 7.5 mg on Sundays.  Recheck in 1 weeks.

## 2022-06-28 ENCOUNTER — Ambulatory Visit (INDEPENDENT_AMBULATORY_CARE_PROVIDER_SITE_OTHER): Payer: Medicare Other

## 2022-06-28 ENCOUNTER — Ambulatory Visit: Payer: Self-pay

## 2022-06-28 DIAGNOSIS — Z7901 Long term (current) use of anticoagulants: Secondary | ICD-10-CM | POA: Diagnosis not present

## 2022-06-28 LAB — POCT INR: INR: 2.5 (ref 2.0–3.0)

## 2022-06-28 NOTE — Patient Instructions (Addendum)
Pre visit review using our clinic review tool, if applicable. No additional management support is needed unless otherwise documented below in the visit note.  Continue 5 mg tablet daily except take 7.5 mg on Sundays. Take last dose of warfarin on 3/31, Sunday.

## 2022-06-28 NOTE — Progress Notes (Signed)
Pt was advised by pulmonary physician that she can stop taking warfarin at the end of the month. Pt wants INR checked once more before stopping. Continue 5 mg tablet daily except take 7.5 mg on Sundays. Take last dose of warfarin on 3/31, Sunday.

## 2022-07-13 ENCOUNTER — Encounter: Payer: Self-pay | Admitting: Pulmonary Disease

## 2022-07-13 ENCOUNTER — Ambulatory Visit (INDEPENDENT_AMBULATORY_CARE_PROVIDER_SITE_OTHER): Payer: Medicare Other | Admitting: Pulmonary Disease

## 2022-07-13 VITALS — BP 138/88 | HR 62 | Ht 67.5 in | Wt 207.0 lb

## 2022-07-13 DIAGNOSIS — R0602 Shortness of breath: Secondary | ICD-10-CM | POA: Diagnosis not present

## 2022-07-13 DIAGNOSIS — J45909 Unspecified asthma, uncomplicated: Secondary | ICD-10-CM

## 2022-07-13 MED ORDER — FLUTICASONE-SALMETEROL 250-50 MCG/ACT IN AEPB: 1.0000 | INHALATION_SPRAY | Freq: Two times a day (BID) | RESPIRATORY_TRACT | 5 refills | Status: DC

## 2022-07-13 NOTE — Progress Notes (Signed)
Kari Coleman    [id number]    December 22, 1950  Primary Care Physician:Worley, Lelon MastSamantha, [place]  Referring Physician: Jarold MottoWorley, Samantha, PA [address] [place] [id number]  Chief complaint:   Patient being seen for shortness of breath  HPI:  History of large pulmonary embolus for which she completed anticoagulation Completed anticoagulation end of March 2024  She feels relatively well  Shortness of breath on exertion is much better  She does have a history of asthma for which she used to use Symbicort, Symbicort is not affordable for her  She does use albuterol about every other day  She is limited by some knee pain, does use a cane to ambulate  No chest pains or chest discomfort Inhaler technique was reviewed in the office today  She was accompanied by family member and we did review a CT scan -Most recent CT scan of the chest shows resolution of the blood clot  -Most recent echocardiogram was reviewed as well  Outpatient Encounter Medications as of 07/13/2022  Medication Sig   acetaminophen (TYLENOL) 325 MG tablet Take 650 mg by mouth every 4 (four) hours as needed for mild pain or moderate pain.   albuterol (PROVENTIL HFA) 108 (90 Base) MCG/ACT inhaler Inhale 2 puffs into the lungs every 6 (six) hours as needed for wheezing or shortness of breath.   albuterol (PROVENTIL) (2.5 MG/3ML) 0.083% nebulizer solution Take 3 mLs (2.5 mg total) by nebulization every 6 (six) hours as needed for wheezing or shortness of breath.   amLODipine (NORVASC) 2.5 MG tablet Take 1 tablet (2.5 mg total) by mouth daily.   budesonide-formoterol (SYMBICORT) 80-4.5 MCG/ACT inhaler Inhale 2 puffs into the lungs 2 (two) times daily.   Cholecalciferol (VITAMIN D-3) 25 MCG (1000 UT) CAPS Take 2,000 Units by mouth daily.   dorzolamide-timolol (COSOPT) 22.3-6.8 MG/ML ophthalmic solution Place 1 drop into the right eye 2 (two) times daily.   latanoprost (XALATAN) 0.005 % ophthalmic  solution Place 1 drop into both eyes at bedtime. As directed   levothyroxine (SYNTHROID) 125 MCG tablet Take 1 tablet (125 mcg total) by mouth daily.   lisinopril (ZESTRIL) 40 MG tablet TAKE 1 TABLET BY MOUTH  DAILY   metoprolol tartrate (LOPRESSOR) 25 MG tablet Take 1 tablet by mouth twice daily   pantoprazole (PROTONIX) 40 MG tablet Take 1 tablet (40 mg total) by mouth 2 (two) times daily before a meal.   polyethylene glycol (MIRALAX / GLYCOLAX) 17 g packet Take 17 g by mouth daily.   pravastatin (PRAVACHOL) 10 MG tablet TAKE 1 TABLET BY MOUTH DAILY   prednisoLONE acetate (PRED FORTE) 1 % ophthalmic suspension Place 1 drop into both eyes every 4 (four) hours.   senna-docusate (SENOKOT-S) 8.6-50 MG tablet Take 2 tablets by mouth 2 (two) times daily.   warfarin (COUMADIN) 5 MG tablet TAKE 1 TABLET BY MOUTH DAILY OR AS DIRECTED BY ANTICOAGULATION CLINIC (Patient not taking: Reported on 07/13/2022)   No facility-administered encounter medications on file as of 07/13/2022.    Allergies as of 07/13/2022 - Review Complete 07/13/2022  Allergen Reaction Noted   Doxycycline Nausea Only 06/11/2017   Codeine Nausea And Vomiting 03/08/2013   Pollen extract Other (See Comments)     Past Medical History:  Diagnosis Date   Allergy    Arthritis    Cataract    Cervical cancer    GERD (gastroesophageal reflux disease)    Glaucoma 2017   History of  echocardiogram    Echo 9/16:  Mild LVH, EF 55-60%, no RWMA, Gr 1 DD, mild MR // Echo 1/17:  Mild LVH, EF 50-55%, Gr 1 DD, mild to mod MR, mild LAE   Hypertension    Hyperthyroidism    s/p RAI treatment   Palpitations    PONV (postoperative nausea and vomiting)    years ago  . Woke up and saw asian people    PONV (postoperative nausea and vomiting) 07/18/2021   Retinal detachment    L eye - partial blindness   Tobacco abuse    Tuberculosis    positive test as a caregiver cxr annually    Urinary incontinence     Past Surgical History:  Procedure  Laterality Date   ABDOMINAL HYSTERECTOMY  1984   bladder surgey for incontinence     EYE SURGERY     REPAIR OF COMPLEX TRACTION RETINAL DETACHMENT     thyroid radiation     TOTAL ABDOMINAL HYSTERECTOMY W/ BILATERAL SALPINGOOPHORECTOMY     TOTAL KNEE ARTHROPLASTY Right 04/22/2019   Procedure: RIGHT TOTAL KNEE ARTHROPLASTY;  Surgeon: Marcene Corning, MD;  Location: [facility];  Service: Orthopedics;  Laterality: Right;  PENDING PRE-OP FOR SDDC APPROVAL    Family History  Problem Relation Age of Onset   Hypertension Mother    Heart failure Mother    Diabetes Mother    Pancreatic cancer Father    Colon cancer Maternal Aunt    Cancer Sister    Thyroid disease Paternal Aunt    Cancer Paternal Aunt    Thyroid disease Other        paternal side   Cancer Maternal Grandfather    Diabetes Paternal Grandmother    Diabetes Maternal Uncle    Colon polyps Neg Hx    Stomach cancer Neg Hx     Social History   Socioeconomic History   Marital status: Single    Spouse name: Not on file   Number of children: 2   Years of education: Not on file   Highest education level: Not on file  Occupational History   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   Occupation: Retired  Tobacco Use   Smoking status: Former    Packs/day: 0.50    Years: 48.00    Additional pack years: 0.00    Total pack years: 24.00    Types: Cigarettes    Start date: 11/30/1968    Quit date: 10/31/2016    Years since quitting: 5.7   Smokeless tobacco: Never   Tobacco comments:    did not smoke more than .5 pack   Vaping Use   Vaping Use: Never used  Substance and Sexual Activity   Alcohol use: Not Currently    Alcohol/week: 0.0 standard drinks of alcohol    Comment: rarely once a year   Drug use: No   Sexual activity: Not Currently  Other Topics Concern   Not on file  Social History Narrative   Caregiver   Daughter and grandson in household   Social Determinants of Health   Financial Resource Strain: Low  Risk  (12/12/2021)   Overall Financial Resource Strain (CARDIA)    Difficulty of Paying Living Expenses: Not hard at all  Food Insecurity: No Food Insecurity (12/26/2021)   Hunger Vital Sign    Worried About Running Out of Food in the Last Year: Never true    Ran Out of Food in the Last Year: Never true  Transportation Needs:  No Transportation Needs (12/26/2021)   PRAPARE - Administrator, Civil Service (Medical): No    Lack of Transportation (Non-Medical): No  Physical Activity: Inactive (12/12/2021)   Exercise Vital Sign    Days of Exercise per Week: 0 days    Minutes of Exercise per Session: 0 min  Stress: No Stress Concern Present (12/12/2021)   [employer] of Occupational Health - Occupational Stress Questionnaire    Feeling of Stress : Not at all  Social Connections: Moderately Isolated (12/12/2021)   Social Connection and Isolation Panel [NHANES]    Frequency of Communication with Friends and Family: More than three times a week    Frequency of Social Gatherings with Friends and Family: Twice a week    Attends Religious Services: 1 to 4 times per year    Active Member of [employer] or Organizations: No    Attends Banker Meetings: Never    Marital Status: Never married  Intimate Partner Violence: Not At Risk (12/26/2021)   Humiliation, Afraid, Rape, and Kick questionnaire    Fear of Current or Ex-Partner: No    Emotionally Abused: No    Physically Abused: No    Sexually Abused: No    Review of Systems  Respiratory:  Positive for shortness of breath.     Vitals:   07/13/22 1514  BP: 138/88  Pulse: 62  SpO2: 96%     Physical Exam Constitutional:      Appearance: She is obese.  HENT:     Head: Normocephalic.     Nose: Nose normal.  Eyes:     General: No scleral icterus.    Pupils: Pupils are equal, round, and reactive to light.  Cardiovascular:     Rate and Rhythm: Normal rate and regular rhythm.     Heart sounds: No murmur heard.    No  friction rub.  Pulmonary:     Effort: No respiratory distress.     Breath sounds: No stridor. No wheezing or rhonchi.  Musculoskeletal:     Cervical back: No rigidity or tenderness.  Neurological:     Mental Status: She is alert.  Psychiatric:        Mood and Affect: Mood normal.    Inhaler technique was reviewed with her today -She does have a spacer that she uses with the inhaler  Data Reviewed: Echocardiogram reviewed showing diastolic dysfunction, no mention of pulmonary hypertension Previous echo from 2023 reviewed as well  Most recent CT scan compared with previous showing resolution of clot  Assessment:  History of pulmonary embolism -To complete anticoagulation  Shortness of breath  History of asthma -Has not been using Symbicort -Frequent use of albuterol  Deconditioning  History of diastolic dysfunction  Plan/Recommendations: Encouraged to continue inhaler use  Will switch from Symbicort to Wixela to see if this is more affordable  Continue albuterol use as needed  Follow-up in about 6 months  Encouraged to call with significant concerns     Virl Diamond MD [facility] 07/13/2022, 3:18 PM  CC: [name], PA

## 2022-07-13 NOTE — Patient Instructions (Signed)
Prescription placed for Wixela in place of Symbicort  If the medication is too expensive to pick up, you can ask the pharmacist to help you check what is better covered by your insurance for asthma  I will see you in about 6 months  Graded exercise as tolerated

## 2022-08-09 ENCOUNTER — Other Ambulatory Visit: Payer: Self-pay | Admitting: Physician Assistant

## 2022-08-10 DIAGNOSIS — R350 Frequency of micturition: Secondary | ICD-10-CM | POA: Diagnosis not present

## 2022-08-10 DIAGNOSIS — N3000 Acute cystitis without hematuria: Secondary | ICD-10-CM | POA: Diagnosis not present

## 2022-08-31 ENCOUNTER — Ambulatory Visit: Payer: Medicare Other | Admitting: Physician Assistant

## 2022-08-31 ENCOUNTER — Ambulatory Visit (INDEPENDENT_AMBULATORY_CARE_PROVIDER_SITE_OTHER): Payer: Medicare Other | Admitting: Family Medicine

## 2022-08-31 VITALS — BP 138/80 | HR 69 | Temp 98.3°F | Ht 67.5 in | Wt 209.2 lb

## 2022-08-31 DIAGNOSIS — B029 Zoster without complications: Secondary | ICD-10-CM

## 2022-08-31 NOTE — Progress Notes (Signed)
[facility]IMARY [facility] [address] [place] [address] Dept: [phone] Dept Fax: [phone]  Office Visit  Subjective:    Patient ID: Kari Coleman, female    DOB: 08/10/1950, 72 y.o..   MRN: [id number]  Chief Complaint  Patient presents with   Rash    C/o having a rash on Lt upper chest/shoulder 4 days.     History of Present Illness:  Patient is in today with a rash on her left upper chest. She notes this first came up on Saturday (5 days ago). She thought she might have had some bug bites. She admits that the skin feels different, but notes it isn't exactly painful. She does recall chickenpox as a child. She never got the shingles vaccine, as she was afraid it would cause her to develop shingles.  Past Medical History: Patient Active Problem List   Diagnosis Date Noted   Herpes zoster without complication 08/31/2022   Chronic deep vein thrombosis (DVT) of left popliteal vein (HCC) 12/28/2021   Acute pulmonary embolism (HCC) 12/26/2021   Urinary incontinence 07/18/2021   Tuberculosis 07/18/2021   Tobacco abuse 07/18/2021   Retinal detachment 07/18/2021   PONV (postoperative nausea and vomiting) 07/18/2021   Palpitations 07/18/2021   Hyperthyroidism 07/18/2021   History of echocardiogram 07/18/2021   GERD (gastroesophageal reflux disease) 07/18/2021   Cervical cancer (HCC) 07/18/2021   Cataract 07/18/2021   Arthritis 07/18/2021   Allergy 07/18/2021   Chronic angle-closure glaucoma of both eyes, severe stage 03/02/2021   Vitamin D deficiency 01/22/2020   Multinodular goiter 01/22/2020   Fatigue 10/21/2019   Primary osteoarthritis of right knee 04/22/2019   Abnormal EKG 04/14/2019   Hypothyroidism 04/11/2019   Elevated glucose 04/08/2019   Chest discomfort 04/25/2018   Ex-smoker 04/25/2018   Primary localized osteoarthritis of right knee 01/04/2016   Glaucoma 2017   Hypertension    Past Surgical History:   Procedure Laterality Date   ABDOMINAL HYSTERECTOMY  1984   bladder surgey for incontinence     EYE SURGERY     REPAIR OF COMPLEX TRACTION RETINAL DETACHMENT     thyroid radiation     TOTAL ABDOMINAL HYSTERECTOMY W/ BILATERAL SALPINGOOPHORECTOMY     TOTAL KNEE ARTHROPLASTY Right 04/22/2019   Procedure: RIGHT TOTAL KNEE ARTHROPLASTY;  Surgeon: Marcene Corning, MD;  Location: [facility];  Service: Orthopedics;  Laterality: Right;  PENDING PRE-OP FOR SDDC APPROVAL   Family History  Problem Relation Age of Onset   Hypertension Mother    Heart failure Mother    Diabetes Mother    Pancreatic cancer Father    Colon cancer Maternal Aunt    Cancer Sister    Thyroid disease Paternal Aunt    Cancer Paternal Aunt    Thyroid disease Other        paternal side   Cancer Maternal Grandfather    Diabetes Paternal Grandmother    Diabetes Maternal Uncle    Colon polyps Neg Hx    Stomach cancer Neg Hx    Outpatient Medications Prior to Visit  Medication Sig Dispense Refill   acetaminophen (TYLENOL) 325 MG tablet Take 650 mg by mouth every 4 (four) hours as needed for mild pain or moderate pain.     albuterol (PROVENTIL HFA) 108 (90 Base) MCG/ACT inhaler Inhale 2 puffs into the lungs every 6 (six) hours as needed for wheezing or shortness of breath. 18 g 2   albuterol (PROVENTIL) (2.5 MG/3ML) 0.083% nebulizer solution Take 3 mLs (  2.5 mg total) by nebulization every 6 (six) hours as needed for wheezing or shortness of breath. 150 mL 1   amLODipine (NORVASC) 2.5 MG tablet Take 1 tablet (2.5 mg total) by mouth daily. 90 tablet 1   budesonide-formoterol (SYMBICORT) 80-4.5 MCG/ACT inhaler Inhale 2 puffs into the lungs 2 (two) times daily. 1 each 3   Cholecalciferol (VITAMIN D-3) 25 MCG (1000 UT) CAPS Take 2,000 Units by mouth daily.     ciprofloxacin (CIPRO) 250 MG tablet Take 250 mg by mouth 2 (two) times daily.     dorzolamide-timolol (COSOPT) 22.3-6.8 MG/ML ophthalmic solution Place 1 drop into the right  eye 2 (two) times daily.     fluticasone-salmeterol (WIXELA INHUB) 250-50 MCG/ACT AEPB Inhale 1 puff into the lungs in the morning and at bedtime. 60 each 5   latanoprost (XALATAN) 0.005 % ophthalmic solution Place 1 drop into both eyes at bedtime. As directed     levothyroxine (SYNTHROID) 125 MCG tablet Take 1 tablet (125 mcg total) by mouth daily. 45 tablet 3   lisinopril (ZESTRIL) 40 MG tablet TAKE 1 TABLET BY MOUTH  DAILY 90 tablet 3   metoprolol tartrate (LOPRESSOR) 25 MG tablet Take 1 tablet by mouth twice daily 180 tablet 1   pantoprazole (PROTONIX) 40 MG tablet Take 1 tablet (40 mg total) by mouth 2 (two) times daily before a meal. 180 tablet 0   polyethylene glycol (MIRALAX / GLYCOLAX) 17 g packet Take 17 g by mouth daily. 14 each 0   pravastatin (PRAVACHOL) 10 MG tablet TAKE 1 TABLET BY MOUTH DAILY 90 tablet 3   prednisoLONE acetate (PRED FORTE) 1 % ophthalmic suspension Place 1 drop into both eyes every 4 (four) hours. 5 mL 0   senna-docusate (SENOKOT-S) 8.6-50 MG tablet Take 2 tablets by mouth 2 (two) times daily. 60 tablet 0   warfarin (COUMADIN) 5 MG tablet TAKE 1 TABLET BY MOUTH DAILY OR AS DIRECTED BY ANTICOAGULATION CLINIC 90 tablet 1   No facility-administered medications prior to visit.   Allergies  Allergen Reactions   Doxycycline Nausea Only   Codeine Nausea And Vomiting   Pollen Extract Other (See Comments)    Unknown reaction      Objective:   Today's Vitals   08/31/22 1102  BP: 138/80  Pulse: 69  Temp: 98.3 F (36.8 C)  TempSrc: Temporal  SpO2: 97%  Weight: 209 lb 3.2 oz (94.9 kg)  Height: 5' 7.5" (1.715 m)   Body mass index is 32.28 kg/m.   General: Well developed, well nourished. No acute distress. Skin: Warm and dry. There is a cluster of small crusted lesions with mild surrounding erythema on the left   upper chest wall, with a few scattered lesions towards the apex of the shoulder. This is in a left C4   dermatomal distribution. Neuro: CN II-XII  intact. Normal sensation and DTR bilaterally. Psych: Alert and oriented. Normal mood and affect.  Health Maintenance Due  Topic Date Due   DTaP/Tdap/Td (1 - Tdap) Never done   Assessment & Plan:   Problem List Items Addressed This Visit       Other   Herpes zoster without complication - Primary    Discussed etiology of shingles. her case appears mild, as she is not having pain and these are healing. As it has been grater than 72 hours since onset, an antiviral would not be indicated. Recommend follow-up if healing does not progress as expected or if significant pain develops.  Return if symptoms worsen or fail to improve.   Loyola Mast, MD

## 2022-08-31 NOTE — Assessment & Plan Note (Signed)
Discussed etiology of shingles. her case appears mild, as she is not having pain and these are healing. As it has been grater than 72 hours since onset, an antiviral would not be indicated. Recommend follow-up if healing does not progress as expected or if significant pain develops.

## 2022-10-19 ENCOUNTER — Other Ambulatory Visit: Payer: Self-pay | Admitting: Physician Assistant

## 2022-10-20 DIAGNOSIS — H402233 Chronic angle-closure glaucoma, bilateral, severe stage: Secondary | ICD-10-CM | POA: Diagnosis not present

## 2022-11-16 ENCOUNTER — Encounter (INDEPENDENT_AMBULATORY_CARE_PROVIDER_SITE_OTHER): Payer: Self-pay

## 2022-11-26 ENCOUNTER — Other Ambulatory Visit: Payer: Self-pay

## 2022-11-26 ENCOUNTER — Encounter (HOSPITAL_COMMUNITY): Payer: Self-pay

## 2022-11-26 ENCOUNTER — Emergency Department (HOSPITAL_COMMUNITY): Payer: Medicare Other

## 2022-11-26 ENCOUNTER — Emergency Department (HOSPITAL_COMMUNITY)
Admission: EM | Admit: 2022-11-26 | Discharge: 2022-11-26 | Disposition: A | Discharge status: Home / Self Care | Payer: Medicare Other | Source: Home / Self Care | Attending: Emergency Medicine | Admitting: Emergency Medicine

## 2022-11-26 DIAGNOSIS — R110 Nausea: Secondary | ICD-10-CM | POA: Diagnosis not present

## 2022-11-26 DIAGNOSIS — N390 Urinary tract infection, site not specified: Secondary | ICD-10-CM | POA: Diagnosis not present

## 2022-11-26 DIAGNOSIS — E039 Hypothyroidism, unspecified: Secondary | ICD-10-CM | POA: Insufficient documentation

## 2022-11-26 DIAGNOSIS — R531 Weakness: Secondary | ICD-10-CM | POA: Diagnosis not present

## 2022-11-26 DIAGNOSIS — Z79899 Other long term (current) drug therapy: Secondary | ICD-10-CM | POA: Insufficient documentation

## 2022-11-26 DIAGNOSIS — R6889 Other general symptoms and signs: Secondary | ICD-10-CM | POA: Diagnosis not present

## 2022-11-26 DIAGNOSIS — Z720 Tobacco use: Secondary | ICD-10-CM | POA: Insufficient documentation

## 2022-11-26 DIAGNOSIS — R300 Dysuria: Secondary | ICD-10-CM | POA: Diagnosis present

## 2022-11-26 DIAGNOSIS — Z8541 Personal history of malignant neoplasm of cervix uteri: Secondary | ICD-10-CM | POA: Diagnosis not present

## 2022-11-26 DIAGNOSIS — R404 Transient alteration of awareness: Secondary | ICD-10-CM | POA: Diagnosis not present

## 2022-11-26 DIAGNOSIS — I10 Essential (primary) hypertension: Secondary | ICD-10-CM | POA: Insufficient documentation

## 2022-11-26 LAB — COMPREHENSIVE METABOLIC PANEL
ALT: 23 U/L (ref 0–44)
AST: 24 U/L (ref 15–41)
Albumin: 4.2 g/dL (ref 3.5–5.0)
Alkaline Phosphatase: 118 U/L (ref 38–126)
Anion gap: 7 (ref 5–15)
BUN: 20 mg/dL (ref 8–23)
CO2: 26 mmol/L (ref 22–32)
Calcium: 9.9 mg/dL (ref 8.9–10.3)
Chloride: 105 mmol/L (ref 98–111)
Creatinine, Ser: 1.34 mg/dL — ABNORMAL HIGH (ref 0.44–1.00)
GFR, Estimated: 42 mL/min — ABNORMAL LOW (ref >60)
Glucose, Bld: 99 mg/dL (ref 70–99)
Potassium: 3.7 mmol/L (ref 3.5–5.1)
Sodium: 138 mmol/L (ref 135–145)
Total Bilirubin: 0.6 mg/dL (ref 0.3–1.2)
Total Protein: 8.5 g/dL — ABNORMAL HIGH (ref 6.5–8.1)

## 2022-11-26 LAB — LIPASE, BLOOD: Lipase: 29 U/L (ref 11–51)

## 2022-11-26 LAB — URINALYSIS, W/ REFLEX TO CULTURE (INFECTION SUSPECTED)
Bilirubin Urine: NEGATIVE
Glucose, UA: NEGATIVE mg/dL
Ketones, ur: NEGATIVE mg/dL
Nitrite: NEGATIVE
Protein, ur: 30 mg/dL — AB
Specific Gravity, Urine: 1.024 (ref 1.005–1.030)
WBC, UA: >50 WBC/hpf (ref 0–5)
pH: 5 (ref 5.0–8.0)

## 2022-11-26 LAB — CBC WITH DIFFERENTIAL/PLATELET
Abs Immature Granulocytes: 0.03 10*3/uL (ref 0.00–0.07)
Basophils Absolute: 0.1 10*3/uL (ref 0.0–0.1)
Basophils Relative: 1 %
Eosinophils Absolute: 0.1 10*3/uL (ref 0.0–0.5)
Eosinophils Relative: 2 %
HCT: 44.3 % (ref 36.0–46.0)
Hemoglobin: 14.3 g/dL (ref 12.0–15.0)
Immature Granulocytes: 0 %
Lymphocytes Relative: 23 %
Lymphs Abs: 1.8 10*3/uL (ref 0.7–4.0)
MCH: 28.7 pg (ref 26.0–34.0)
MCHC: 32.3 g/dL (ref 30.0–36.0)
MCV: 89 fL (ref 80.0–100.0)
Monocytes Absolute: 0.5 10*3/uL (ref 0.1–1.0)
Monocytes Relative: 6 %
Neutro Abs: 5.2 10*3/uL (ref 1.7–7.7)
Neutrophils Relative %: 68 %
Platelets: 294 10*3/uL (ref 150–400)
RBC: 4.98 MIL/uL (ref 3.87–5.11)
RDW: 12.5 % (ref 11.5–15.5)
WBC: 7.6 10*3/uL (ref 4.0–10.5)
nRBC: 0 % (ref 0.0–0.2)

## 2022-11-26 MED ORDER — CEFUROXIME AXETIL 250 MG PO TABS: 250.0000 mg | ORAL_TABLET | Freq: Two times a day (BID) | ORAL | 0 refills | Status: AC

## 2022-11-26 MED ORDER — ONDANSETRON HCL 4 MG/2ML IJ SOLN
4.0000 mg | Freq: Once | INTRAMUSCULAR | Status: AC
  Administered 2022-11-26: 4 mg via INTRAVENOUS
  Filled 2022-11-26: qty 2

## 2022-11-26 MED ORDER — ONDANSETRON 8 MG PO TBDP: 8.0000 mg | ORAL_TABLET | Freq: Three times a day (TID) | ORAL | 0 refills | Status: DC | PRN

## 2022-11-26 MED ORDER — AMLODIPINE BESYLATE 5 MG PO TABS
5.0000 mg | ORAL_TABLET | Freq: Once | ORAL | Status: AC
  Administered 2022-11-26: 5 mg via ORAL
  Filled 2022-11-26: qty 1

## 2022-11-26 MED ORDER — SODIUM CHLORIDE 0.9 % IV SOLN
1.0000 g | Freq: Once | INTRAVENOUS | Status: AC
  Administered 2022-11-26: 1 g via INTRAVENOUS
  Filled 2022-11-26: qty 10

## 2022-11-26 NOTE — ED Provider Notes (Signed)
[place] EMERGENCY DEPARTMENT AT [facility] Provider Note   CSN: [id number] Arrival date & time: 11/26/22  1853     History  Chief Complaint  Patient presents with   Dysuria    Kari Coleman is a 72 y.o. female.  HPI   Patient has a history of hypertension hypothyroidism cervical cancer tobacco use glaucoma acid reflux arthritis.  Daughter called EMS today because patient has been acting more fatigued and weak today.  She has been complaining of foul-smelling urine and burning with urination.  Patient also describes feeling some nausea without vomiting.  She denies any abdominal pain.  No fevers.  Patient states she has been taking her blood pressure medications  Home Medications Prior to Admission medications   Medication Sig Start Date End Date Taking? Authorizing Provider  cefUROXime (CEFTIN) 250 MG tablet Take 1 tablet (250 mg total) by mouth 2 (two) times daily with a meal for 7 days. 11/26/22 12/03/22 Yes Linwood Dibbles, MD  ondansetron (ZOFRAN-ODT) 8 MG disintegrating tablet Take 1 tablet (8 mg total) by mouth every 8 (eight) hours as needed for nausea or vomiting. 11/26/22  Yes Linwood Dibbles, MD  acetaminophen (TYLENOL) 325 MG tablet Take 650 mg by mouth every 4 (four) hours as needed for mild pain or moderate pain.    [provider]  albuterol (PROVENTIL HFA) 108 (90 Base) MCG/ACT inhaler Inhale 2 puffs into the lungs every 6 (six) hours as needed for wheezing or shortness of breath. 05/31/21   [name], PA  albuterol (PROVENTIL) (2.5 MG/3ML) 0.083% nebulizer solution Take 3 mLs (2.5 mg total) by nebulization every 6 (six) hours as needed for wheezing or shortness of breath. 07/12/21   [name], PA  amLODipine (NORVASC) 2.5 MG tablet Take 1 tablet (2.5 mg total) by mouth daily. 05/17/22   [name], PA  budesonide-formoterol [facility]) 80-4.5 MCG/ACT inhaler Inhale 2 puffs into the lungs 2 (two) times daily. 07/15/21   [name], PA   Cholecalciferol (VITAMIN D-3) 25 MCG (1000 UT) CAPS Take 2,000 Units by mouth daily.    [provider]  ciprofloxacin (CIPRO) 250 MG tablet Take 250 mg by mouth 2 (two) times daily. 08/10/22   [provider]  dorzolamide-timolol (COSOPT) 22.3-6.8 MG/ML ophthalmic solution Place 1 drop into the right eye 2 (two) times daily. 11/12/20   [provider]  fluticasone-salmeterol (WIXELA INHUB) 250-50 MCG/ACT AEPB Inhale 1 puff into the lungs in the morning and at bedtime. 07/13/22   Olalere, Onnie Boer A, MD  latanoprost (XALATAN) 0.005 % ophthalmic solution Place 1 drop into both eyes at bedtime. As directed 11/20/14   [provider]  levothyroxine (SYNTHROID) 125 MCG tablet Take 1 tablet (125 mcg total) by mouth daily. 06/09/22   Carlus Pavlov, MD  lisinopril (ZESTRIL) 40 MG tablet TAKE 1 TABLET BY MOUTH DAILY 10/20/22   [name], PA  metoprolol tartrate (LOPRESSOR) 25 MG tablet Take 1 tablet by mouth twice daily 05/17/22   [name], PA  pantoprazole (PROTONIX) 40 MG tablet Take 1 tablet (40 mg total) by mouth 2 (two) times daily before a meal. 02/13/22   [name], PA  polyethylene glycol (MIRALAX / GLYCOLAX) 17 g packet Take 17 g by mouth daily. 01/03/22   Osvaldo Shipper, MD  pravastatin (PRAVACHOL) 10 MG tablet TAKE 1 TABLET BY MOUTH DAILY 10/20/22   [name], PA  prednisoLONE acetate (PRED FORTE) 1 % ophthalmic suspension Place 1 drop into both eyes every 4 (four) hours.  01/03/22   Osvaldo Shipper, MD  senna-docusate (SENOKOT-S) 8.6-50 MG tablet Take 2 tablets by mouth 2 (two) times daily. 01/02/22   Osvaldo Shipper, MD  warfarin (COUMADIN) 5 MG tablet TAKE 1 TABLET BY MOUTH DAILY OR AS DIRECTED BY ANTICOAGULATION CLINIC 05/22/22   [name], PA      Allergies    Doxycycline, Codeine, and Pollen extract    Review of Systems   Review of Systems  Physical Exam Updated Vital Signs BP (!) 166/87   Pulse 71   Temp 97.9 F (36.6  C) (Oral)   Resp 15   SpO2 97%  Physical Exam Vitals and nursing note reviewed.  Constitutional:      Appearance: She is well-developed. She is not diaphoretic.  HENT:     Head: Normocephalic and atraumatic.     Right Ear: External ear normal.     Left Ear: External ear normal.  Eyes:     General: No scleral icterus.       Right eye: No discharge.        Left eye: No discharge.     Conjunctiva/sclera: Conjunctivae normal.  Neck:     Trachea: No tracheal deviation.  Cardiovascular:     Rate and Rhythm: Normal rate and regular rhythm.  Pulmonary:     Effort: Pulmonary effort is normal. No respiratory distress.     Breath sounds: Normal breath sounds. No stridor. No wheezing or rales.  Abdominal:     General: Bowel sounds are normal. There is no distension.     Palpations: Abdomen is soft.     Tenderness: There is no abdominal tenderness. There is no guarding or rebound.  Musculoskeletal:        General: No tenderness or deformity.     Cervical back: Neck supple.     Right lower leg: No edema.     Left lower leg: No edema.  Skin:    General: Skin is warm and dry.     Findings: No rash.  Neurological:     General: No focal deficit present.     Mental Status: She is alert.     Cranial Nerves: No cranial nerve deficit, dysarthria or facial asymmetry.     Sensory: No sensory deficit.     Motor: No abnormal muscle tone or seizure activity.     Coordination: Coordination normal.     Comments: Normal strength and sensation bilateral upper extremities lower extremities  Psychiatric:        Mood and Affect: Mood normal.     ED Results / Procedures / Treatments   Labs (all labs ordered are listed, but only abnormal results are displayed) Labs Reviewed  COMPREHENSIVE METABOLIC PANEL - Abnormal; Notable for the following components:      Result Value   Creatinine, Ser 1.34 (*)    Total Protein 8.5 (*)    GFR, Estimated 42 (*)    All other components within normal limits   URINALYSIS, W/ REFLEX TO CULTURE (INFECTION SUSPECTED) - Abnormal; Notable for the following components:   APPearance CLOUDY (*)    Hgb urine dipstick SMALL (*)    Protein, ur 30 (*)    Leukocytes,Ua LARGE (*)    Bacteria, UA RARE (*)    All other components within normal limits  URINE CULTURE  LIPASE, BLOOD  CBC WITH DIFFERENTIAL/PLATELET    EKG EKG Interpretation Date/Time:  Sunday November 26 2022 19:56:28 EDT Ventricular Rate:  76 PR Interval:  178 QRS Duration:  112 QT Interval:  399 QTC Calculation: 449 R Axis:   -17  Text Interpretation: Sinus rhythm Left ventricular hypertrophy Nonspecific T abnormalities, diffuse leads Confirmed by Vanetta Mulders [phone]) on 11/26/2022 7:58:45 PM  Radiology DG Chest 2 View  Result Date: 11/26/2022 CLINICAL DATA:  Weakness. EXAM: CHEST - 2 VIEW COMPARISON:  Chest radiograph dated 11/17/2021. FINDINGS: No focal consolidation, pleural effusion, or pneumothorax. Stable cardiac silhouette. No acute osseous pathology. IMPRESSION: No active cardiopulmonary disease. Electronically Signed   By: Elgie Collard M.D.   On: 11/26/2022 20:23    Procedures Procedures    Medications Ordered in ED Medications  ondansetron [facility]) injection 4 mg (4 mg Intravenous Given 11/26/22 2043)  amLODipine (NORVASC) tablet 5 mg (5 mg Oral Given 11/26/22 2043)  cefTRIAXone (ROCEPHIN) 1 g in sodium chloride 0.9 % 100 mL IVPB (1 g Intravenous New Bag/Given 11/26/22 2233)    ED Course/ Medical Decision Making/ A&P Clinical Course as of 11/26/22 2336  Sun Nov 26, 2022  2214 CBC normal.  Metabolic panel does show slight increase in creatinine.  Lipase normal.  Urinalysis suggestive of infection [JK]  2235 Patient states she is feeling better.  No nausea will try p.o. challenge in addition to fluid bolus [JK]  2336 Patient was able to ambulate without difficulty [JK]    Clinical Course User Index [JK] Linwood Dibbles, MD                                 Medical  Decision Making Problems Addressed: Lower urinary tract infectious disease: acute illness or injury that poses a threat to life or bodily functions  Amount and/or Complexity of Data Reviewed Labs: ordered. Decision-making details documented in ED Course. Radiology: ordered and independent interpretation performed.  Risk Prescription drug management.   Patient presented to ED for evaluation of weakness nausea urinary symptoms.  Patient's ED workup notable for hypertension but that improved with oral medications.  Labs showed mild increase in her creatinine although not significantly changed compared to previous values.  CBC was normal.  No evidence of hepatitis or pancreatitis.  Urinalysis did show signs of urinary tract infection.  In the ED patient did not have any vomiting.  She was feeling better if she wants to go home.  Patient was given a dose of IV antibiotics.  Findings and plans were discussed with the patient's daughters.  Will make sure she can tolerate p.o. fluids and have her ambulate around the ED        Final Clinical Impression(s) / ED Diagnoses Final diagnoses:  Lower urinary tract infectious disease    Rx / DC Orders ED Discharge Orders          Ordered    cefUROXime (CEFTIN) 250 MG tablet  2 times daily with meals        11/26/22 2310    ondansetron (ZOFRAN-ODT) 8 MG disintegrating tablet  Every 8 hours PRN        11/26/22 2310              Linwood Dibbles, MD 11/26/22 2336

## 2022-11-26 NOTE — Discharge Instructions (Signed)
Take the antibiotics as prescribed.  Follow-up with your doctor to be rechecked.  Return to the ED for fevers chills worsening symptoms

## 2022-11-26 NOTE — ED Triage Notes (Signed)
Pt BIBA from home with UTI symptoms c/oburning/foul smelling urine x1 day.  Daughter stated to EMS her mom hets frequent UTIS but has been excessively weak and not herself x1 day

## 2022-11-29 DIAGNOSIS — H402233 Chronic angle-closure glaucoma, bilateral, severe stage: Secondary | ICD-10-CM | POA: Diagnosis not present

## 2022-11-29 LAB — URINE CULTURE: Culture: >=100000 — AB

## 2022-11-30 NOTE — Telephone Encounter (Signed)
Post ED Visit - Positive Culture Follow-up  Culture report reviewed by antimicrobial stewardship pharmacist: [facility] []  Enzo Bi, Pharm.D. []  Celedonio Miyamoto, Pharm.D., BCPS AQ-ID []  Garvin Fila, Pharm.D., BCPS []  Georgina Pillion, Pharm.D., BCPS []  [place], [address].D., BCPS, AAHIVP []  Estella Husk, Pharm.D., BCPS, AAHIVP []  Lysle Pearl, PharmD, BCPS []  Phillips Climes, PharmD, BCPS []  Agapito Games, PharmD, BCPS []  Verlan Friends, PharmD []  Mervyn Gay, PharmD, BCPS []  Vinnie Level, PharmD  [facility] [x]  Nicole Kindred, PharmD []  Greer Pickerel, PharmD []  Adalberto Cole, PharmD []  Perlie Gold, Rph []  Lonell Face) Jean Rosenthal, PharmD []  Earl Many, PharmD []  Junita Push, PharmD []  Dorna Leitz, PharmD []  Terrilee Files, PharmD []  Lynann Beaver, PharmD []  Keturah Barre, PharmD []  Loralee Pacas, PharmD []  Bernadene Person, PharmD   Positive Urine culture Treated with Cefuroxime, organism sensitive to the same and no further patient follow-up is required at this time.  Bing Quarry 11/30/2022, 9:48 AM

## 2022-12-07 ENCOUNTER — Telehealth: Payer: Self-pay | Admitting: *Deleted

## 2022-12-07 NOTE — Telephone Encounter (Signed)
Transition Care Management Follow-up Telephone Call Date of discharge and from where: [facility]   11/24/2022 How have you been since you were released from the hospital? Feeling much better  Any questions or concerns? No  Items Reviewed: Did the pt receive and understand the discharge instructions provided? Yes  Medications obtained and verified? Yes  Other? No  Any new allergies since your discharge? No  Dietary orders reviewed? No Do you have support at home? No      Follow up appointments reviewed:  PCP Hospital f/u appt confirmed? Yes   Will  see on 12/17/2022 Are transportation arrangements needed? No  If their condition worsens, is the pt aware to call PCP or go to the Emergency Dept.? Yes Was the patient provided with contact information for the PCP's office or ED? Yes Was to pt encouraged to call back with questions or concerns? Yes   Dione Booze [facility]  Population Health Careguide  Direct Dial: [phone] Website: [email or web address]

## 2022-12-14 ENCOUNTER — Encounter: Payer: Self-pay | Admitting: Internal Medicine

## 2022-12-14 ENCOUNTER — Ambulatory Visit: Payer: Medicare Other | Admitting: Internal Medicine

## 2022-12-14 VITALS — BP 160/80 | HR 61 | Ht 67.5 in | Wt 208.6 lb

## 2022-12-14 DIAGNOSIS — E890 Postprocedural hypothyroidism: Secondary | ICD-10-CM

## 2022-12-14 DIAGNOSIS — E042 Nontoxic multinodular goiter: Secondary | ICD-10-CM

## 2022-12-14 MED ORDER — LEVOTHYROXINE SODIUM 125 MCG PO TABS: 125.0000 ug | ORAL_TABLET | Freq: Every day | ORAL | 3 refills | Status: DC

## 2022-12-14 NOTE — Patient Instructions (Addendum)
Please  decrease levothyroxine dose to 125 mcg daily.  Take the thyroid hormone every day, with water, at least 30 minutes before breakfast, separated by at least 4 hours from: - acid reflux medications - calcium - iron - multivitamins  Please come back for labs in 5 weeks.  Please return in 4 months.

## 2022-12-14 NOTE — Progress Notes (Signed)
Patient ID: Kari Coleman, female   DOB: [birth year], 72 y.o.   MRN: [id number]   HPI  Kari Coleman is a 72 y.o.-year-old female, returning for follow-up for post ablative hypothyroidism for left toxic adenoma and multinodular goiter.  She previously saw Dr. Everardo All, last visit 6 months ago.  She is here with her grandson, but history is given entirely by the patient.  Interim history: She recently had a UTI >> started ABx. She had constipation with the ABx. No weight gain, cold intolerance.  Reviewed hx: Pt. has been dx with a toxic multinodular goiter in 2013.  On the thyroid uptake and scan, she had a left toxic adenoma.  She had RAI treatment in 07/2018 and was started on levothyroxine subsequently.  Pt. is on levothyroxine 125 mcg daily, decreased at last visit, however, she tells me that she was sent the 175 mcg  - taking this for 1 mo now. - in am - fasting - at least 30 min from b'fast - no calcium - no iron - no multivitamins - + Protonix ~1h after LT4 >> moved this to the evening - not on Biotin On Vitamin D.  I reviewed pt's thyroid tests -she did not return for labs as advised after the change in dose: Lab Results  Component Value Date   TSH <0.01 repeated and verified (L) 06/09/2022   TSH 0.99 08/12/2021   TSH 1.30 01/26/2021   TSH 10.12 (H) 08/26/2020   TSH 7.88 (H) 05/27/2020   TSH 5.66 (H) 01/22/2020   TSH 5.13 (H) 10/21/2019   TSH 8.05 (H) 07/11/2019   TSH 2.92 04/14/2019   TSH 4.68 (H) 01/21/2019   FREET4 1.51 06/09/2022   FREET4 1.05 01/26/2021   FREET4 0.52 (L) 08/26/2020   FREET4 0.80 05/27/2020   FREET4 0.61 01/22/2020   FREET4 0.75 10/21/2019   FREET4 0.58 (L) 07/11/2019   FREET4 0.86 04/14/2019   FREET4 0.49 (L) 01/21/2019   FREET4 0.61 12/23/2018   T3FREE 5.1 (H) 06/11/2017   Antithyroid antibodies: No results found for: "THGAB" No components found for: "TPOAB"  Thyroid U/S (03/26/2014): There is marked diffuse heterogeneity of thyroid  parenchymal echotexture   Right thyroid lobe.: Normal in size measuring 4.7 x 2.4 x 2.5 cm.   Right, mid - 2.5 x 1.5 x 1.8 cm - mixed echogenic, partially cystic partially solid, slightly ill-defined, possibly a pseudo nodule.  There are innumerable additional punctate sub cm mixed echogenic though predominantly cystic nodules scattered throughout the remainder of the right lobe of the thyroid.   Left thyroid lobe: Borderline enlarged measuring 6.1 x 2.5 x 2.4 cm.   Left, inferior, anterior - 1.9 x 1.3 x 1.9 cm - anechoic, likely cystic.  *Left, superior, anterior - 2.2 x 1.2 x 1.6 cm - mixed echogenic, partially cystic, predominantly solid.  There are innumerable additional punctate sub cm mixed echogenic though predominantly cystic nodules scattered throughout the remainder of the left lobe of the thyroid.   Isthmus Thickness: Enlarged measuring 0.5 cm in diameter.   Mid, anterior - 0.5 cm - hypoechoic, likely cystic.  There are 2 additional < 3 mm anechoic cysts within the thyroid isthmus.   Lymphadenopathy: None visualized.   IMPRESSION: Findings compatible with multi nodular goiter. The dominant approximately 2.5 cm nodule within the mid aspect of the right lobe of the thyroid as well as the dominant approximately 2.2 cm nodule within the superior, anterior aspect the left lobe of the thyroid both meet imaging criteria  to recommend percutaneous sampling as clinically indicated.   Thyroid FNA (06/16/2014): Adequacy Reason Satisfactory For Evaluation. Diagnosis THYROID, FINE NEEDLE ASPIRATION, LEFT LOBE LUP, ANTERIOR, SUPERIOR (SPECIMEN 1 OF 2 COLLECTED ON 06/16/14): ATYPIA. (BETHESDA CATEGORY III). SEE COMMENT. Jimmy Picket MD Pathologist, Electronic Signature (Case signed 06/17/2014) Specimen Clinical Information Left, Superior, Anterior - 2.2 x 1.2 x 1.6 cm - mixed echogenic, Partially cystic, Predominantly solid Source Thyroid, Fine Needle Aspiration, Left Lobe  LUP, Anterior, Superior, (Specimen 1 of 2, collected on 06/16/14 )  Adequacy Reason Satisfactory For Evaluation. Diagnosis THYROID, FINE NEEDLE ASPIRATION, RIGHT LOBE RMP (SPECIMEN 2 OF 2 COLLECTED 06-16-2014) BENIGN (BETHESDA CATEGORY II). FINDINGS CONSISTENT WITH BENIGN CYSTIC NODULE. Jimmy Picket MD Pathologist, Electronic Signature (Case signed 06/17/2014) Specimen Clinical Information Right, Mid - 2.5 x 1.5 x 1.8 cm - mixed echogenic , Partially cystic partially solid, Slightly ill - defiined, Possibly a pseudo nodule Source Thyroid, Fine Needle Aspiration, Right Lobe RMP, (Specimen 2 of 2, collected on 06/16/14 )  Thyroid uptake and scan (05/16/2018): 4 hour I-123 uptake = 21.5% (normal 5-20%)  24 hour I-123 uptake = 49.8% (normal 10-30%)   IMPRESSION: Elevated 4 hour and 24 hour radio iodine uptakes as above consistent with hyperthyroidism.   When compared to the previous study, the 24 hour radio iodine uptake has increased from the 23.8% on the previous exam.   Homogeneous tracer distribution in both thyroid lobes with suspect mildly hyperfunctional adenoma at the upper pole the LEFT lobe.  RAI tx (07/26/2018)  Thyroid U/S (02/06/2020): Parenchymal Echotexture: Markedly heterogenous  Isthmus: 0.4 cm  Right lobe: 3.7 x 1.4 x 1.1 cm  Left lobe: 3.8 x 1.3 x 1.4 cm  _________________________________________________________   Estimated total number of nodules >/= 1 cm: 2 _________________________________________________________   Nodule # 1: A solid isoechoic nodule in the right upper gland measures 1.3 x 0.9 x 0.6 cm. This nodule does not meet criteria for biopsy or further imaging surveillance. Of note, it is in similar location to the previously biopsied spongiform thyroid nodule and likely represents the involuted remnants of that prior nodule.   Several small nodules are present in the left upper and lower gland. The nodules are either less than 1 cm in size or  purely cystic and do not meet criteria for biopsy or further imaging evaluation.   No thyroid nodules of consequence are identified.   IMPRESSION: Small, heterogeneous thyroid gland with multiple small nodules which do not meet criteria for biopsy or further imaging surveillance. No further follow-up is required.  Pt denies - fatigue - cold intolerance - constipation - hair loss  She but had - weight gain -8 pounds before last visit an 1 lb since then  Pt denies: - feeling nodules in neck - hoarseness - dysphagia, only occasionally, positionally - choking  She has + FH of thyroid disorders in many family members. No FH of thyroid cancer. No h/o radiation tx to head or neck. No recent use of iodine supplements.  No herbal supplements.  No recent steroid use.  Pt. 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 She has a history of PE in 12/2021.  ROS: + see HPI  Past Medical History:  Diagnosis Date   Allergy    Arthritis    Cataract    Cervical cancer (HCC)    GERD (gastroesophageal reflux disease)    Glaucoma 2017   History of echocardiogram  Echo 9/16:  Mild LVH, EF 55-60%, no RWMA, Gr 1 DD, mild MR // Echo 1/17:  Mild LVH, EF 50-55%, Gr 1 DD, mild to mod MR, mild LAE   Hypertension    Hyperthyroidism    s/p RAI treatment   Palpitations    PONV (postoperative nausea and vomiting)    years ago  . Woke up and saw asian people    PONV (postoperative nausea and vomiting) 07/18/2021   Retinal detachment    L eye - partial blindness   Tobacco abuse    Tuberculosis    positive test as a caregiver cxr annually    Urinary incontinence    Past Surgical History:  Procedure Laterality Date   ABDOMINAL HYSTERECTOMY  1984   bladder surgey for incontinence     EYE SURGERY     REPAIR OF COMPLEX TRACTION RETINAL DETACHMENT     thyroid radiation     TOTAL ABDOMINAL HYSTERECTOMY  W/ BILATERAL SALPINGOOPHORECTOMY     TOTAL KNEE ARTHROPLASTY Right 04/22/2019   Procedure: RIGHT TOTAL KNEE ARTHROPLASTY;  Surgeon: Marcene Corning, MD;  Location: [facility];  Service: Orthopedics;  Laterality: Right;  PENDING PRE-OP FOR SDDC APPROVAL   Social History   Socioeconomic History   Marital status: Single    Spouse name: Not on file   Number of children: 2   Years of education: Not on file   Highest education level: Not on file  Occupational History   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   Occupation: Retired  Tobacco Use   Smoking status: Former    Current packs/day: 0.00    Average packs/day: 0.5 packs/day for 48.0 years (24.0 ttl pk-yrs)    Types: Cigarettes    Start date: 11/30/1968    Quit date: 10/31/2016    Years since quitting: 6.1   Smokeless tobacco: Never   Tobacco comments:    did not smoke more than .5 pack   Vaping Use   Vaping status: Never Used  Substance and Sexual Activity   Alcohol use: Not Currently    Alcohol/week: 0.0 standard drinks of alcohol    Comment: rarely once a year   Drug use: No   Sexual activity: Not Currently  Other Topics Concern   Not on file  Social History Narrative   Caregiver   Daughter and grandson in household   Social Determinants of Health   Financial Resource Strain: Low Risk  (12/12/2021)   Overall Financial Resource Strain (CARDIA)    Difficulty of Paying Living Expenses: Not hard at all  Food Insecurity: No Food Insecurity (12/26/2021)   Hunger Vital Sign    Worried About Running Out of Food in the Last Year: Never true    Ran Out of Food in the Last Year: Never true  Transportation Needs: No Transportation Needs (12/26/2021)   PRAPARE - Administrator, Civil Service (Medical): No    Lack of Transportation (Non-Medical): No  Physical Activity: Inactive (12/12/2021)   Exercise Vital Sign    Days of Exercise per Week: 0 days    Minutes of Exercise per Session: 0 min  Stress: No Stress Concern  Present (12/12/2021)   [employer] of Occupational Health - Occupational Stress Questionnaire    Feeling of Stress : Not at all  Social Connections: Moderately Isolated (12/12/2021)   Social Connection and Isolation Panel [NHANES]    Frequency of Communication with Friends and Family: More than three times a week  Frequency of Social Gatherings with Friends and Family: Twice a week    Attends Religious Services: 1 to 4 times per year    Active Member of [employer] or Organizations: No    Attends Banker Meetings: Never    Marital Status: Never married  Intimate Partner Violence: Not At Risk (12/26/2021)   Humiliation, Afraid, Rape, and Kick questionnaire    Fear of Current or Ex-Partner: No    Emotionally Abused: No    Physically Abused: No    Sexually Abused: No   Current Outpatient Medications on File Prior to Visit  Medication Sig Dispense Refill   acetaminophen (TYLENOL) 325 MG tablet Take 650 mg by mouth every 4 (four) hours as needed for mild pain or moderate pain.     albuterol (PROVENTIL HFA) 108 (90 Base) MCG/ACT inhaler Inhale 2 puffs into the lungs every 6 (six) hours as needed for wheezing or shortness of breath. 18 g 2   albuterol (PROVENTIL) (2.5 MG/3ML) 0.083% nebulizer solution Take 3 mLs (2.5 mg total) by nebulization every 6 (six) hours as needed for wheezing or shortness of breath. 150 mL 1   amLODipine (NORVASC) 2.5 MG tablet Take 1 tablet (2.5 mg total) by mouth daily. 90 tablet 1   budesonide-formoterol (SYMBICORT) 80-4.5 MCG/ACT inhaler Inhale 2 puffs into the lungs 2 (two) times daily. 1 each 3   Cholecalciferol (VITAMIN D-3) 25 MCG (1000 UT) CAPS Take 2,000 Units by mouth daily.     ciprofloxacin (CIPRO) 250 MG tablet Take 250 mg by mouth 2 (two) times daily.     dorzolamide-timolol (COSOPT) 22.3-6.8 MG/ML ophthalmic solution Place 1 drop into the right eye 2 (two) times daily.     fluticasone-salmeterol (WIXELA INHUB) 250-50 MCG/ACT AEPB Inhale 1  puff into the lungs in the morning and at bedtime. 60 each 5   latanoprost (XALATAN) 0.005 % ophthalmic solution Place 1 drop into both eyes at bedtime. As directed     levothyroxine (SYNTHROID) 125 MCG tablet Take 1 tablet (125 mcg total) by mouth daily. 45 tablet 3   lisinopril (ZESTRIL) 40 MG tablet TAKE 1 TABLET BY MOUTH DAILY 100 tablet 0   metoprolol tartrate (LOPRESSOR) 25 MG tablet Take 1 tablet by mouth twice daily 180 tablet 1   ondansetron (ZOFRAN-ODT) 8 MG disintegrating tablet Take 1 tablet (8 mg total) by mouth every 8 (eight) hours as needed for nausea or vomiting. 12 tablet 0   pantoprazole (PROTONIX) 40 MG tablet Take 1 tablet (40 mg total) by mouth 2 (two) times daily before a meal. 180 tablet 0   polyethylene glycol (MIRALAX / GLYCOLAX) 17 g packet Take 17 g by mouth daily. 14 each 0   pravastatin (PRAVACHOL) 10 MG tablet TAKE 1 TABLET BY MOUTH DAILY 100 tablet 0   prednisoLONE acetate (PRED FORTE) 1 % ophthalmic suspension Place 1 drop into both eyes every 4 (four) hours. 5 mL 0   senna-docusate (SENOKOT-S) 8.6-50 MG tablet Take 2 tablets by mouth 2 (two) times daily. 60 tablet 0   warfarin (COUMADIN) 5 MG tablet TAKE 1 TABLET BY MOUTH DAILY OR AS DIRECTED BY ANTICOAGULATION CLINIC 90 tablet 1   No current facility-administered medications on file prior to visit.   Allergies  Allergen Reactions   Doxycycline Nausea Only   Codeine Nausea And Vomiting   Pollen Extract Other (See Comments)    Unknown reaction    Family History  Problem Relation Age of Onset   Hypertension Mother  Heart failure Mother    Diabetes Mother    Pancreatic cancer Father    Colon cancer Maternal Aunt    Cancer Sister    Thyroid disease Paternal Aunt    Cancer Paternal Aunt    Thyroid disease Other        paternal side   Cancer Maternal Grandfather    Diabetes Paternal Grandmother    Diabetes Maternal Uncle    Colon polyps Neg Hx    Stomach cancer Neg Hx    PE: BP (!) 160/80    Pulse 61   Ht 5' 7.5" (1.715 m)   Wt 208 lb 9.6 oz (94.6 kg)   SpO2 96%   BMI 32.19 kg/m  Wt Readings from Last 3 Encounters:  12/14/22 208 lb 9.6 oz (94.6 kg)  08/31/22 209 lb 3.2 oz (94.9 kg)  07/13/22 207 lb (93.9 kg)   Constitutional: overweight, in NAD Eyes:  EOMI, no exophthalmos ENT: no neck masses, thyroid not palpable, no cervical lymphadenopathy Cardiovascular: RRR, No MRG Respiratory: CTA B Musculoskeletal: no deformities Skin:no rashes Neurological: no tremor with outstretched hands  ASSESSMENT: 1.  Post ablative hypothyroidism - after RAI tx for left toxic adenoma  2.  Multinodular goiter  PLAN:  1. Patient with  longstanding hypothyroidism, on levothyroxine therapy - latest thyroid labs reviewed with pt. >> TSH was suppressed at last check: Lab Results  Component Value Date   TSH <0.01 repeated and verified (L) 06/09/2022  -At last visit, I advised her to decrease the dose of LT4 from 150 to 125 mcg daily - she did not return for labs in 1.5 months as advised... At today's visit, however, for the last month it appears that she has been taking the 175 mcg daily.  However, she is not very clear about when she changed the doses... For now, I advised her to decrease the dose to 125 mcg daily, and there is no point in rechecking her TFTs today on this very high dose - we discussed about taking the thyroid hormone every day, with water, >30 minutes before breakfast, separated by >4 hours from acid reflux medications, calcium, iron, multivitamins. Pt. is taking it correctly now.  She was previously taking levothyroxine along with other meds and taking Protonix to close.  At last visit, I advised her to separated from the rest of the meds and moving Protonix at least 4 hours later - she takes it in the evening now - will check thyroid tests in 5 weeks after the above change: TSH and fT4 - OTW, I will see her in 4 months  2.  Multinodular goiter -Diagnosed in 2013 -s/p 2  Bx's in 2016: The dominant nodule biopsy was benign, while the left nodule biopsy was inconclusive (Bethesda category 3) -She had RAI treatment in 07/2018 -She had a follow-up ultrasound in 2021 and this showed a small thyroid, with only small thyroid nodules for which no follow-up was recommended. -She has no masses felt on palpation of her neck today and no neck compression symptoms -We did discuss about the fact that RAI treatment can shrink thyroid gland and also nodules. -No need for imaging now, we can continue to follow her expectantly  + flu shot today  Carlus Pavlov, MD PhD [facility] Endocrinology

## 2022-12-18 ENCOUNTER — Inpatient Hospital Stay (HOSPITAL_COMMUNITY)
Admission: EM | Admit: 2022-12-18 | Discharge: 2022-12-21 | DRG: 689 | Disposition: A | Discharge status: Home / Self Care | Payer: Medicare Other | Attending: Internal Medicine | Admitting: Internal Medicine

## 2022-12-18 ENCOUNTER — Emergency Department (HOSPITAL_COMMUNITY): Payer: Medicare Other

## 2022-12-18 DIAGNOSIS — Z6832 Body mass index (BMI) 32.0-32.9, adult: Secondary | ICD-10-CM

## 2022-12-18 DIAGNOSIS — Z743 Need for continuous supervision: Secondary | ICD-10-CM | POA: Diagnosis not present

## 2022-12-18 DIAGNOSIS — Z7951 Long term (current) use of inhaled steroids: Secondary | ICD-10-CM | POA: Diagnosis not present

## 2022-12-18 DIAGNOSIS — Z96651 Presence of right artificial knee joint: Secondary | ICD-10-CM | POA: Diagnosis present

## 2022-12-18 DIAGNOSIS — R509 Fever, unspecified: Secondary | ICD-10-CM | POA: Diagnosis not present

## 2022-12-18 DIAGNOSIS — R410 Disorientation, unspecified: Secondary | ICD-10-CM | POA: Diagnosis not present

## 2022-12-18 DIAGNOSIS — Z800 Family history of malignant neoplasm of digestive organs: Secondary | ICD-10-CM | POA: Diagnosis not present

## 2022-12-18 DIAGNOSIS — E860 Dehydration: Secondary | ICD-10-CM | POA: Diagnosis not present

## 2022-12-18 DIAGNOSIS — E890 Postprocedural hypothyroidism: Secondary | ICD-10-CM | POA: Diagnosis present

## 2022-12-18 DIAGNOSIS — G9341 Metabolic encephalopathy: Secondary | ICD-10-CM | POA: Diagnosis present

## 2022-12-18 DIAGNOSIS — Z8541 Personal history of malignant neoplasm of cervix uteri: Secondary | ICD-10-CM

## 2022-12-18 DIAGNOSIS — R6889 Other general symptoms and signs: Secondary | ICD-10-CM | POA: Diagnosis not present

## 2022-12-18 DIAGNOSIS — A419 Sepsis, unspecified organism: Secondary | ICD-10-CM

## 2022-12-18 DIAGNOSIS — Z79890 Hormone replacement therapy (postmenopausal): Secondary | ICD-10-CM

## 2022-12-18 DIAGNOSIS — Z87891 Personal history of nicotine dependence: Secondary | ICD-10-CM | POA: Diagnosis not present

## 2022-12-18 DIAGNOSIS — Z7901 Long term (current) use of anticoagulants: Secondary | ICD-10-CM | POA: Diagnosis not present

## 2022-12-18 DIAGNOSIS — H5462 Unqualified visual loss, left eye, normal vision right eye: Secondary | ICD-10-CM | POA: Diagnosis not present

## 2022-12-18 DIAGNOSIS — G9340 Encephalopathy, unspecified: Secondary | ICD-10-CM | POA: Diagnosis not present

## 2022-12-18 DIAGNOSIS — E669 Obesity, unspecified: Secondary | ICD-10-CM | POA: Diagnosis present

## 2022-12-18 DIAGNOSIS — R4182 Altered mental status, unspecified: Secondary | ICD-10-CM | POA: Diagnosis not present

## 2022-12-18 DIAGNOSIS — Z1152 Encounter for screening for COVID-19: Secondary | ICD-10-CM | POA: Diagnosis not present

## 2022-12-18 DIAGNOSIS — I700 Atherosclerosis of aorta: Secondary | ICD-10-CM | POA: Diagnosis not present

## 2022-12-18 DIAGNOSIS — I129 Hypertensive chronic kidney disease with stage 1 through stage 4 chronic kidney disease, or unspecified chronic kidney disease: Secondary | ICD-10-CM | POA: Diagnosis not present

## 2022-12-18 DIAGNOSIS — I10 Essential (primary) hypertension: Secondary | ICD-10-CM | POA: Insufficient documentation

## 2022-12-18 DIAGNOSIS — Z86711 Personal history of pulmonary embolism: Secondary | ICD-10-CM

## 2022-12-18 DIAGNOSIS — B9620 Unspecified Escherichia coli [E. coli] as the cause of diseases classified elsewhere: Secondary | ICD-10-CM | POA: Diagnosis present

## 2022-12-18 DIAGNOSIS — N182 Chronic kidney disease, stage 2 (mild): Secondary | ICD-10-CM | POA: Diagnosis present

## 2022-12-18 DIAGNOSIS — N3000 Acute cystitis without hematuria: Secondary | ICD-10-CM | POA: Diagnosis not present

## 2022-12-18 DIAGNOSIS — H547 Unspecified visual loss: Secondary | ICD-10-CM | POA: Diagnosis present

## 2022-12-18 DIAGNOSIS — E872 Acidosis, unspecified: Secondary | ICD-10-CM | POA: Diagnosis present

## 2022-12-18 DIAGNOSIS — N390 Urinary tract infection, site not specified: Secondary | ICD-10-CM | POA: Diagnosis not present

## 2022-12-18 DIAGNOSIS — Z8249 Family history of ischemic heart disease and other diseases of the circulatory system: Secondary | ICD-10-CM | POA: Diagnosis not present

## 2022-12-18 DIAGNOSIS — Z833 Family history of diabetes mellitus: Secondary | ICD-10-CM

## 2022-12-18 DIAGNOSIS — R531 Weakness: Secondary | ICD-10-CM | POA: Diagnosis not present

## 2022-12-18 DIAGNOSIS — E039 Hypothyroidism, unspecified: Secondary | ICD-10-CM | POA: Diagnosis present

## 2022-12-18 DIAGNOSIS — Z90710 Acquired absence of both cervix and uterus: Secondary | ICD-10-CM

## 2022-12-18 LAB — CBC WITH DIFFERENTIAL/PLATELET
Abs Immature Granulocytes: 0.03 10*3/uL (ref 0.00–0.07)
Basophils Absolute: 0 10*3/uL (ref 0.0–0.1)
Basophils Relative: 1 %
Eosinophils Absolute: 0 10*3/uL (ref 0.0–0.5)
Eosinophils Relative: 1 %
HCT: 43.2 % (ref 36.0–46.0)
Hemoglobin: 14.5 g/dL (ref 12.0–15.0)
Immature Granulocytes: 0 %
Lymphocytes Relative: 13 %
Lymphs Abs: 1.1 10*3/uL (ref 0.7–4.0)
MCH: 29.5 pg (ref 26.0–34.0)
MCHC: 33.6 g/dL (ref 30.0–36.0)
MCV: 87.8 fL (ref 80.0–100.0)
Monocytes Absolute: 0.4 10*3/uL (ref 0.1–1.0)
Monocytes Relative: 5 %
Neutro Abs: 6.6 10*3/uL (ref 1.7–7.7)
Neutrophils Relative %: 80 %
Platelets: 269 10*3/uL (ref 150–400)
RBC: 4.92 MIL/uL (ref 3.87–5.11)
RDW: 12.7 % (ref 11.5–15.5)
WBC: 8.1 10*3/uL (ref 4.0–10.5)
nRBC: 0 % (ref 0.0–0.2)

## 2022-12-18 LAB — COMPREHENSIVE METABOLIC PANEL
ALT: 21 U/L (ref 0–44)
AST: 20 U/L (ref 15–41)
Albumin: 4.1 g/dL (ref 3.5–5.0)
Alkaline Phosphatase: 107 U/L (ref 38–126)
Anion gap: 10 (ref 5–15)
BUN: 20 mg/dL (ref 8–23)
CO2: 23 mmol/L (ref 22–32)
Calcium: 9.6 mg/dL (ref 8.9–10.3)
Chloride: 103 mmol/L (ref 98–111)
Creatinine, Ser: 0.97 mg/dL (ref 0.44–1.00)
GFR, Estimated: >60 mL/min (ref >60)
Glucose, Bld: 152 mg/dL — ABNORMAL HIGH (ref 70–99)
Potassium: 3.8 mmol/L (ref 3.5–5.1)
Sodium: 136 mmol/L (ref 135–145)
Total Bilirubin: 0.9 mg/dL (ref 0.3–1.2)
Total Protein: 8.1 g/dL (ref 6.5–8.1)

## 2022-12-18 LAB — RESP PANEL BY RT-PCR (RSV, FLU A&B, COVID)  RVPGX2
Influenza A by PCR: NEGATIVE
Influenza B by PCR: NEGATIVE
Resp Syncytial Virus by PCR: NEGATIVE
SARS Coronavirus 2 by RT PCR: NEGATIVE

## 2022-12-18 LAB — URINALYSIS, W/ REFLEX TO CULTURE (INFECTION SUSPECTED)
Bilirubin Urine: NEGATIVE
Glucose, UA: NEGATIVE mg/dL
Ketones, ur: NEGATIVE mg/dL
Nitrite: POSITIVE — AB
Protein, ur: 30 mg/dL — AB
Specific Gravity, Urine: 1.017 (ref 1.005–1.030)
WBC, UA: >50 WBC/hpf (ref 0–5)
pH: 6 (ref 5.0–8.0)

## 2022-12-18 LAB — I-STAT CG4 LACTIC ACID, ED
Lactic Acid, Venous: 1.6 mmol/L (ref 0.5–1.9)
Lactic Acid, Venous: 2.5 mmol/L (ref 0.5–1.9)

## 2022-12-18 LAB — PROTIME-INR
INR: 1.1 (ref 0.8–1.2)
Prothrombin Time: 13.9 s (ref 11.4–15.2)

## 2022-12-18 MED ORDER — LACTATED RINGERS IV SOLN: INTRAVENOUS | Status: DC

## 2022-12-18 MED ORDER — LACTATED RINGERS IV BOLUS (SEPSIS)
1000.0000 mL | Freq: Once | INTRAVENOUS | Status: AC
  Administered 2022-12-18: 1000 mL via INTRAVENOUS

## 2022-12-18 MED ORDER — ACETAMINOPHEN 500 MG PO TABS
1000.0000 mg | ORAL_TABLET | Freq: Once | ORAL | Status: AC
  Administered 2022-12-18: 1000 mg via ORAL
  Filled 2022-12-18: qty 2

## 2022-12-18 MED ORDER — METOPROLOL TARTRATE 25 MG PO TABS
25.0000 mg | ORAL_TABLET | Freq: Once | ORAL | Status: AC
  Administered 2022-12-19: 25 mg via ORAL
  Filled 2022-12-18: qty 1

## 2022-12-18 MED ORDER — SODIUM CHLORIDE 0.9 % IV SOLN
2.0000 g | INTRAVENOUS | Status: DC
  Administered 2022-12-18 – 2022-12-20 (×3): 2 g via INTRAVENOUS
  Filled 2022-12-18 (×3): qty 20

## 2022-12-18 NOTE — ED Provider Notes (Signed)
[place] EMERGENCY DEPARTMENT AT [facility] Provider Note   CSN: [id number] Arrival date & time: 12/18/22  2113     History  Chief Complaint  Patient presents with   Altered Mental Status   Fever    Kari Coleman is a 72 y.o. female.  Pt is a 72 yo female with pmhx significant for htn, hyperthyroidism, cervical cancer, glaucoma, gerd, and arthritis. EMS was called because of AMS and fever.  Pt recently treated for uti.  Pt is a poor historian.  Pt's family gives a better hx.  Pt recently finished abx for uti.  "It was a blue pill."  Pt seemed ok yesterday.  Today, she called both of her daughters and was acting strangely.  She had a tele visit today and told them she was too sick for the tele visit.  She has not eaten much or taken her meds.  When her daughter got home from work, pt had not gotten out of bed and had urinated in the bed.  She was very confused and talking nonsense.         Home Medications Prior to Admission medications   Medication Sig Start Date End Date Taking? Authorizing Provider  acetaminophen (TYLENOL) 325 MG tablet Take 650 mg by mouth every 4 (four) hours as needed for mild pain or moderate pain.    [provider]  albuterol (PROVENTIL HFA) 108 (90 Base) MCG/ACT inhaler Inhale 2 puffs into the lungs every 6 (six) hours as needed for wheezing or shortness of breath. 05/31/21   [name], PA  albuterol (PROVENTIL) (2.5 MG/3ML) 0.083% nebulizer solution Take 3 mLs (2.5 mg total) by nebulization every 6 (six) hours as needed for wheezing or shortness of breath. 07/12/21   [name], PA  amLODipine (NORVASC) 2.5 MG tablet Take 1 tablet (2.5 mg total) by mouth daily. 05/17/22   [name], PA  budesonide-formoterol [facility]) 80-4.5 MCG/ACT inhaler Inhale 2 puffs into the lungs 2 (two) times daily. 07/15/21   [name], PA  Cholecalciferol (VITAMIN D-3) 25 MCG (1000 UT) CAPS Take 2,000 Units by mouth daily.     [provider]  ciprofloxacin (CIPRO) 250 MG tablet Take 250 mg by mouth 2 (two) times daily. 08/10/22   [provider]  dorzolamide-timolol (COSOPT) 22.3-6.8 MG/ML ophthalmic solution Place 1 drop into the right eye 2 (two) times daily. 11/12/20   [provider]  fluticasone-salmeterol (WIXELA INHUB) 250-50 MCG/ACT AEPB Inhale 1 puff into the lungs in the morning and at bedtime. 07/13/22   Olalere, Onnie Boer A, MD  latanoprost (XALATAN) 0.005 % ophthalmic solution Place 1 drop into both eyes at bedtime. As directed 11/20/14   [provider]  levothyroxine (SYNTHROID) 125 MCG tablet Take 1 tablet (125 mcg total) by mouth daily. 12/14/22   Carlus Pavlov, MD  lisinopril (ZESTRIL) 40 MG tablet TAKE 1 TABLET BY MOUTH DAILY 10/20/22   [name], PA  metoprolol tartrate (LOPRESSOR) 25 MG tablet Take 1 tablet by mouth twice daily 05/17/22   [name], PA  ondansetron (ZOFRAN-ODT) 8 MG disintegrating tablet Take 1 tablet (8 mg total) by mouth every 8 (eight) hours as needed for nausea or vomiting. 11/26/22   Linwood Dibbles, MD  pantoprazole (PROTONIX) 40 MG tablet Take 1 tablet (40 mg total) by mouth 2 (two) times daily before a meal. 02/13/22   [name], PA  polyethylene glycol (MIRALAX / GLYCOLAX) 17 g packet Take 17 g by mouth daily. 01/03/22  Osvaldo Shipper, MD  pravastatin (PRAVACHOL) 10 MG tablet TAKE 1 TABLET BY MOUTH DAILY 10/20/22   [name], PA  prednisoLONE acetate (PRED FORTE) 1 % ophthalmic suspension Place 1 drop into both eyes every 4 (four) hours. 01/03/22   Osvaldo Shipper, MD  senna-docusate (SENOKOT-S) 8.6-50 MG tablet Take 2 tablets by mouth 2 (two) times daily. 01/02/22   Osvaldo Shipper, MD  warfarin (COUMADIN) 5 MG tablet TAKE 1 TABLET BY MOUTH DAILY OR AS DIRECTED BY ANTICOAGULATION CLINIC Patient not taking: Reported on 12/14/2022 05/22/22   [name], PA      Allergies    Doxycycline, Codeine, and Pollen extract     Review of Systems   Review of Systems  Constitutional:  Positive for chills and fever.  All other systems reviewed and are negative.   Physical Exam Updated Vital Signs BP (!) 184/102   Pulse 76   Temp 98.3 F (36.8 C)   Resp 16   SpO2 95%  Physical Exam Vitals and nursing note reviewed.  Constitutional:      Appearance: Normal appearance. She is obese.  HENT:     Head: Normocephalic and atraumatic.     Right Ear: External ear normal.     Left Ear: External ear normal.     Nose: Nose normal.     Mouth/Throat:     Mouth: Mucous membranes are dry.     Pharynx: Oropharynx is clear.  Eyes:     Extraocular Movements: Extraocular movements intact.     Conjunctiva/sclera: Conjunctivae normal.     Pupils: Pupils are equal, round, and reactive to light.  Cardiovascular:     Rate and Rhythm: Regular rhythm. Tachycardia present.     Pulses: Normal pulses.     Heart sounds: Normal heart sounds.  Pulmonary:     Effort: Pulmonary effort is normal.     Breath sounds: Normal breath sounds.  Abdominal:     General: Abdomen is flat. Bowel sounds are normal.     Palpations: Abdomen is soft.  Musculoskeletal:        General: Normal range of motion.     Cervical back: Normal range of motion and neck supple.  Skin:    General: Skin is warm.     Capillary Refill: Capillary refill takes less than 2 seconds.  Neurological:     General: No focal deficit present.     Mental Status: She is alert and oriented to person, place, and time.  Psychiatric:        Mood and Affect: Mood normal.        Behavior: Behavior normal.     ED Results / Procedures / Treatments   Labs (all labs ordered are listed, but only abnormal results are displayed) Labs Reviewed  COMPREHENSIVE METABOLIC PANEL - Abnormal; Notable for the following components:      Result Value   Glucose, Bld 152 (*)    All other components within normal limits  URINALYSIS, W/ REFLEX TO CULTURE (INFECTION SUSPECTED) -  Abnormal; Notable for the following components:   APPearance HAZY (*)    Hgb urine dipstick SMALL (*)    Protein, ur 30 (*)    Nitrite POSITIVE (*)    Leukocytes,Ua LARGE (*)    Bacteria, UA RARE (*)    All other components within normal limits  I-STAT CG4 LACTIC ACID, ED - Abnormal; Notable for the following components:   Lactic Acid, Venous 2.5 (*)    All other components within normal limits  RESP PANEL BY RT-PCR (RSV, FLU A&B, COVID)  RVPGX2  CULTURE, BLOOD (ROUTINE X 2)  CULTURE, BLOOD (ROUTINE X 2)  URINE CULTURE  CBC WITH DIFFERENTIAL/PLATELET  PROTIME-INR  I-STAT CG4 LACTIC ACID, ED    EKG EKG Interpretation Date/Time:  Monday December 18 2022 21:50:23 EDT Ventricular Rate:  76 PR Interval:  175 QRS Duration:  89 QT Interval:  382 QTC Calculation: 430 R Axis:   10  Text Interpretation: Sinus rhythm Probable LVH with secondary repol abnrm Anterior Q waves, possibly due to LVH No significant change since last tracing Confirmed by Jacalyn Lefevre [phone]) on 12/18/2022 10:27:04 PM  Radiology DG Chest 2 View  Result Date: 12/18/2022 CLINICAL DATA:  Altered mental status and fever.  Suspected sepsis EXAM: CHEST - 2 VIEW COMPARISON:  11/26/2022 FINDINGS: Stable cardiomediastinal silhouette. Aortic atherosclerotic calcification. No focal consolidation, pleural effusion, or pneumothorax. No displaced rib fractures. IMPRESSION: No acute cardiopulmonary disease. Electronically Signed   By: Minerva Fester M.D.   On: 12/18/2022 22:40    Procedures Procedures    Medications Ordered in ED Medications  lactated ringers infusion ( Intravenous New Bag/Given 12/18/22 2204)  cefTRIAXone (ROCEPHIN) 2 g in sodium chloride 0.9 % 100 mL IVPB (0 g Intravenous Stopped 12/18/22 2205)  metoprolol tartrate (LOPRESSOR) tablet 25 mg (has no administration in time range)  lactated ringers bolus 1,000 mL (1,000 mLs Intravenous New Bag/Given 12/18/22 2149)  acetaminophen (TYLENOL) tablet 1,000 mg  (1,000 mg Oral Given 12/18/22 2148)    ED Course/ Medical Decision Making/ A&P                                 Medical Decision Making Amount and/or Complexity of Data Reviewed Labs: ordered. Radiology: ordered. ECG/medicine tests: ordered.  Risk OTC drugs. Prescription drug management. Decision regarding hospitalization.   This patient presents to the ED for concern of ams, this involves an extensive number of treatment options, and is a complaint that carries with it a high risk of complications and morbidity.  The differential diagnosis includes sepsis, infection,    Co morbidities that complicate the patient evaluation   htn, hyperthyroidism, cervical cancer, glaucoma, gerd, and arthritis   Additional history obtained:  Additional history obtained from epic chart review External records from outside source obtained and reviewed including EMS report   Lab Tests:  I Ordered, and personally interpreted labs.  The pertinent results include:  lactic elevated at 2.5; 2nd lactic 1.6, cmp nl other than glucose elevated at 152, covid/vlu neg, cbc nl   Imaging Studies ordered:  I ordered imaging studies including cxr  I independently visualized and interpreted imaging which showed no acute cardiopulmonary disease.  I agree with the radiologist interpretation   Cardiac Monitoring:  The patient was maintained on a cardiac monitor.  I personally viewed and interpreted the cardiac monitored which showed an underlying rhythm of: st   Medicines ordered and prescription drug management:  I ordered medication including ivfs  for sx  Reevaluation of the patient after these medicines showed that the patient improved I have reviewed the patients home medicines and have made adjustments as needed  Critical Interventions:  Ivfs/abx   Consultations Obtained:  I requested consultation with the hospitalist (Dr. Loney Loh),  and discussed lab and imaging findings as well as  pertinent plan - she will admit   Problem List / ED Course:  UTI with sepsis:  pt meets sepsis criteria.  She is started on rocephin/fluids.  She's not in septic shock, so she is not given sepsis fluids, but she is given 1L  ms has improved after fluids/abx. Htn:  I suspect pt didn't take meds today.     Reevaluation:  After the interventions noted above, I reevaluated the patient and found that they have :improved   Social Determinants of Health:  Lives at home   Dispostion:  After consideration of the diagnostic results and the patients response to treatment, I feel that the patent would benefit from admission.  CRITICAL CARE Performed by: Jacalyn Lefevre   Total critical care time: 30 minutes  Critical care time was exclusive of separately billable procedures and treating other patients.  Critical care was necessary to treat or prevent imminent or life-threatening deterioration.  Critical care was time spent personally by me on the following activities: development of treatment plan with patient and/or surrogate as well as nursing, discussions with consultants, evaluation of patient's response to treatment, examination of patient, obtaining history from patient or surrogate, ordering and performing treatments and interventions, ordering and review of laboratory studies, ordering and review of radiographic studies, pulse oximetry and re-evaluation of patient's condition.           Final Clinical Impression(s) / ED Diagnoses Final diagnoses:  Acute cystitis without hematuria  Sepsis with encephalopathy without septic shock, due to unspecified organism [facility])  Hypertension, unspecified type    Rx / DC Orders ED Discharge Orders     None         Jacalyn Lefevre, MD 12/19/22 0004

## 2022-12-18 NOTE — Progress Notes (Signed)
This encounter was created in error - please disregard.

## 2022-12-18 NOTE — ED Triage Notes (Signed)
Pt arrived from home via [facility] per family completed po abx for recent uti. Per family altered mental status today and fever 101.4 at home 100.2 at triage

## 2022-12-19 ENCOUNTER — Observation Stay (HOSPITAL_COMMUNITY): Payer: Medicare Other

## 2022-12-19 ENCOUNTER — Other Ambulatory Visit: Payer: Self-pay

## 2022-12-19 ENCOUNTER — Encounter (HOSPITAL_COMMUNITY): Payer: Self-pay | Admitting: Internal Medicine

## 2022-12-19 DIAGNOSIS — Z86711 Personal history of pulmonary embolism: Secondary | ICD-10-CM | POA: Diagnosis not present

## 2022-12-19 DIAGNOSIS — Z800 Family history of malignant neoplasm of digestive organs: Secondary | ICD-10-CM | POA: Diagnosis not present

## 2022-12-19 DIAGNOSIS — Z90710 Acquired absence of both cervix and uterus: Secondary | ICD-10-CM | POA: Diagnosis not present

## 2022-12-19 DIAGNOSIS — Z1152 Encounter for screening for COVID-19: Secondary | ICD-10-CM | POA: Diagnosis not present

## 2022-12-19 DIAGNOSIS — N3000 Acute cystitis without hematuria: Secondary | ICD-10-CM | POA: Diagnosis not present

## 2022-12-19 DIAGNOSIS — N182 Chronic kidney disease, stage 2 (mild): Secondary | ICD-10-CM | POA: Diagnosis present

## 2022-12-19 DIAGNOSIS — E669 Obesity, unspecified: Secondary | ICD-10-CM | POA: Diagnosis present

## 2022-12-19 DIAGNOSIS — Z833 Family history of diabetes mellitus: Secondary | ICD-10-CM | POA: Diagnosis not present

## 2022-12-19 DIAGNOSIS — Z7901 Long term (current) use of anticoagulants: Secondary | ICD-10-CM | POA: Diagnosis not present

## 2022-12-19 DIAGNOSIS — Z8249 Family history of ischemic heart disease and other diseases of the circulatory system: Secondary | ICD-10-CM | POA: Diagnosis not present

## 2022-12-19 DIAGNOSIS — N390 Urinary tract infection, site not specified: Secondary | ICD-10-CM | POA: Diagnosis not present

## 2022-12-19 DIAGNOSIS — I672 Cerebral atherosclerosis: Secondary | ICD-10-CM | POA: Diagnosis not present

## 2022-12-19 DIAGNOSIS — E890 Postprocedural hypothyroidism: Secondary | ICD-10-CM | POA: Diagnosis present

## 2022-12-19 DIAGNOSIS — H5462 Unqualified visual loss, left eye, normal vision right eye: Secondary | ICD-10-CM | POA: Diagnosis present

## 2022-12-19 DIAGNOSIS — R4182 Altered mental status, unspecified: Secondary | ICD-10-CM | POA: Diagnosis present

## 2022-12-19 DIAGNOSIS — H547 Unspecified visual loss: Secondary | ICD-10-CM | POA: Diagnosis present

## 2022-12-19 DIAGNOSIS — G9341 Metabolic encephalopathy: Secondary | ICD-10-CM | POA: Diagnosis present

## 2022-12-19 DIAGNOSIS — I6782 Cerebral ischemia: Secondary | ICD-10-CM | POA: Diagnosis not present

## 2022-12-19 DIAGNOSIS — Z96651 Presence of right artificial knee joint: Secondary | ICD-10-CM | POA: Diagnosis present

## 2022-12-19 DIAGNOSIS — Z87891 Personal history of nicotine dependence: Secondary | ICD-10-CM | POA: Diagnosis not present

## 2022-12-19 DIAGNOSIS — B9620 Unspecified Escherichia coli [E. coli] as the cause of diseases classified elsewhere: Secondary | ICD-10-CM | POA: Diagnosis present

## 2022-12-19 DIAGNOSIS — Z7951 Long term (current) use of inhaled steroids: Secondary | ICD-10-CM | POA: Diagnosis not present

## 2022-12-19 DIAGNOSIS — I10 Essential (primary) hypertension: Secondary | ICD-10-CM | POA: Diagnosis not present

## 2022-12-19 DIAGNOSIS — Z79890 Hormone replacement therapy (postmenopausal): Secondary | ICD-10-CM | POA: Diagnosis not present

## 2022-12-19 DIAGNOSIS — Z8541 Personal history of malignant neoplasm of cervix uteri: Secondary | ICD-10-CM | POA: Diagnosis not present

## 2022-12-19 DIAGNOSIS — G9340 Encephalopathy, unspecified: Secondary | ICD-10-CM

## 2022-12-19 DIAGNOSIS — E872 Acidosis, unspecified: Secondary | ICD-10-CM | POA: Diagnosis present

## 2022-12-19 DIAGNOSIS — I129 Hypertensive chronic kidney disease with stage 1 through stage 4 chronic kidney disease, or unspecified chronic kidney disease: Secondary | ICD-10-CM | POA: Diagnosis present

## 2022-12-19 DIAGNOSIS — E860 Dehydration: Secondary | ICD-10-CM | POA: Diagnosis present

## 2022-12-19 DIAGNOSIS — Z6832 Body mass index (BMI) 32.0-32.9, adult: Secondary | ICD-10-CM | POA: Diagnosis not present

## 2022-12-19 LAB — TSH: TSH: 0.048 u[IU]/mL — ABNORMAL LOW (ref 0.350–4.500)

## 2022-12-19 LAB — AMMONIA: Ammonia: 34 umol/L (ref 9–35)

## 2022-12-19 LAB — VITAMIN B12: Vitamin B-12: 108 pg/mL — ABNORMAL LOW (ref 180–914)

## 2022-12-19 MED ORDER — AMLODIPINE BESYLATE 5 MG PO TABS
2.5000 mg | ORAL_TABLET | Freq: Every day | ORAL | Status: DC
  Administered 2022-12-19 – 2022-12-21 (×3): 2.5 mg via ORAL
  Filled 2022-12-19 (×3): qty 1

## 2022-12-19 MED ORDER — IPRATROPIUM-ALBUTEROL 0.5-2.5 (3) MG/3ML IN SOLN
3.0000 mL | Freq: Four times a day (QID) | RESPIRATORY_TRACT | Status: DC | PRN
  Administered 2022-12-19: 3 mL via RESPIRATORY_TRACT
  Filled 2022-12-19: qty 3

## 2022-12-19 MED ORDER — CYANOCOBALAMIN 1000 MCG/ML IJ SOLN
1000.0000 ug | Freq: Every day | INTRAMUSCULAR | Status: DC
  Administered 2022-12-19 – 2022-12-21 (×3): 1000 ug via SUBCUTANEOUS
  Filled 2022-12-19 (×3): qty 1

## 2022-12-19 MED ORDER — ADULT MULTIVITAMIN W/MINERALS CH
1.0000 | ORAL_TABLET | Freq: Every day | ORAL | Status: DC
  Administered 2022-12-19 – 2022-12-21 (×3): 1 via ORAL
  Filled 2022-12-19 (×3): qty 1

## 2022-12-19 MED ORDER — MOMETASONE FURO-FORMOTEROL FUM 200-5 MCG/ACT IN AERO
2.0000 | INHALATION_SPRAY | Freq: Two times a day (BID) | RESPIRATORY_TRACT | Status: DC
  Administered 2022-12-19 – 2022-12-21 (×4): 2 via RESPIRATORY_TRACT
  Filled 2022-12-19: qty 8.8

## 2022-12-19 MED ORDER — LISINOPRIL 20 MG PO TABS
40.0000 mg | ORAL_TABLET | Freq: Every day | ORAL | Status: DC
  Administered 2022-12-19 – 2022-12-21 (×3): 40 mg via ORAL
  Filled 2022-12-19 (×3): qty 2

## 2022-12-19 MED ORDER — ACETAMINOPHEN 325 MG PO TABS
650.0000 mg | ORAL_TABLET | Freq: Four times a day (QID) | ORAL | Status: DC | PRN
  Administered 2022-12-20: 650 mg via ORAL
  Filled 2022-12-19: qty 2

## 2022-12-19 MED ORDER — ACETAMINOPHEN 650 MG RE SUPP: 650.0000 mg | Freq: Four times a day (QID) | RECTAL | Status: DC | PRN

## 2022-12-19 NOTE — H&P (Signed)
History and Physical    Kari Coleman YQM:[id number] DOB: Jan 29, 1951 DOA: 12/18/2022  PCP: [name], PA  Patient coming from: Home  Chief Complaint: AMS  HPI: Kari Coleman is a 72 y.o. 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  Family reported that the patient had recently finished an antibiotic for UTI.  Temperature 100.2 F on arrival to ED and hypertensive with systolic in the 180s.  Not tachycardic.  Labs showing no leukocytosis, sodium 136, glucose 152, lactic acid 2.5> 1.6, blood cultures collected, COVID/influenza/RSV PCR negative.  UA with positive nitrite, large amount of leukocytes, and microscopy showing >50 WBCs and rare bacteria.  Urine culture pending.  Chest x-ray showing no acute cardiopulmonary disease. Patient was given Tylenol, oral metoprolol, ceftriaxone, and 1 L LR.  [facility] called to admit.    Patient appears mildly confused at this time.  States her daughter called EMS as she was confused. She reports being treated for UTI recently. Denies any urinary symptoms at this time. She is reporting cough but denies fevers, chest pain, or shortness of breath. Denies nausea, vomiting, abdominal pain, or diarrhea.   Review of Systems:  Review of Systems  All other systems reviewed and are negative.   Past Medical History:  Diagnosis Date   Allergy    Arthritis    Cataract    Cervical cancer (HCC)    GERD (gastroesophageal reflux disease)    Glaucoma 2017   History of echocardiogram    Echo 9/16:  Mild LVH, EF 55-60%, no RWMA, Gr 1 DD, mild MR // Echo 1/17:  Mild LVH, EF 50-55%, Gr 1 DD, mild to mod MR, mild LAE   Hypertension    Hyperthyroidism    s/p RAI treatment   Palpitations    PONV  (postoperative nausea and vomiting)    years ago  . Woke up and saw asian people    PONV (postoperative nausea and vomiting) 07/18/2021   Retinal detachment    L eye - partial blindness   Tobacco abuse    Tuberculosis    positive test as a caregiver cxr annually    Urinary incontinence     Past Surgical History:  Procedure Laterality Date   ABDOMINAL HYSTERECTOMY  1984   bladder surgey for incontinence     EYE SURGERY     REPAIR OF COMPLEX TRACTION RETINAL DETACHMENT     thyroid radiation     TOTAL ABDOMINAL HYSTERECTOMY W/ BILATERAL SALPINGOOPHORECTOMY     TOTAL KNEE ARTHROPLASTY Right 04/22/2019   Procedure: RIGHT TOTAL KNEE ARTHROPLASTY;  Surgeon: Marcene Corning, MD;  Location: [facility];  Service: Orthopedics;  Laterality: Right;  PENDING PRE-OP FOR SDDC APPROVAL     reports that she quit smoking about 6 years ago. Her smoking use included cigarettes. She started smoking about 54 years ago. She has a 24 pack-year smoking history. She has never used smokeless tobacco. She reports that she does not currently use alcohol. She reports that she does not use drugs.  Allergies  Allergen Reactions   Doxycycline Nausea Only   Codeine Nausea And Vomiting   Pollen Extract Other (See Comments)    Unknown reaction     Family History  Problem Relation Age of Onset   Hypertension Mother    Heart  failure Mother    Diabetes Mother    Pancreatic cancer Father    Colon cancer Maternal Aunt    Cancer Sister    Thyroid disease Paternal Aunt    Cancer Paternal Aunt    Thyroid disease Other        paternal side   Cancer Maternal Grandfather    Diabetes Paternal Grandmother    Diabetes Maternal Uncle    Colon polyps Neg Hx    Stomach cancer Neg Hx     Prior to Admission medications   Medication Sig Start Date End Date Taking? Authorizing Provider  brimonidine (ALPHAGAN) 0.2 % ophthalmic solution Apply to eye. 10/20/22  Yes [provider]  acetaminophen (TYLENOL) 325 MG tablet  Take 650 mg by mouth every 4 (four) hours as needed for mild pain or moderate pain.    [provider]  albuterol (PROVENTIL HFA) 108 (90 Base) MCG/ACT inhaler Inhale 2 puffs into the lungs every 6 (six) hours as needed for wheezing or shortness of breath. 05/31/21   [name], PA  albuterol (PROVENTIL) (2.5 MG/3ML) 0.083% nebulizer solution Take 3 mLs (2.5 mg total) by nebulization every 6 (six) hours as needed for wheezing or shortness of breath. 07/12/21   [name], PA  amLODipine (NORVASC) 2.5 MG tablet Take 1 tablet (2.5 mg total) by mouth daily. 05/17/22   [name], PA  budesonide-formoterol [facility]) 80-4.5 MCG/ACT inhaler Inhale 2 puffs into the lungs 2 (two) times daily. 07/15/21   [name], PA  Cholecalciferol (VITAMIN D-3) 25 MCG (1000 UT) CAPS Take 2,000 Units by mouth daily.    [provider]  ciprofloxacin (CIPRO) 250 MG tablet Take 250 mg by mouth 2 (two) times daily. 08/10/22   [provider]  dorzolamide-timolol (COSOPT) 22.3-6.8 MG/ML ophthalmic solution Place 1 drop into the right eye 2 (two) times daily. 11/12/20   [provider]  fluticasone-salmeterol (WIXELA INHUB) 250-50 MCG/ACT AEPB Inhale 1 puff into the lungs in the morning and at bedtime. 07/13/22   Olalere, Onnie Boer A, MD  latanoprost (XALATAN) 0.005 % ophthalmic solution Place 1 drop into both eyes at bedtime. As directed 11/20/14   [provider]  levothyroxine (SYNTHROID) 125 MCG tablet Take 1 tablet (125 mcg total) by mouth daily. 12/14/22   Carlus Pavlov, MD  lisinopril (ZESTRIL) 40 MG tablet TAKE 1 TABLET BY MOUTH DAILY 10/20/22   [name], PA  metoprolol tartrate (LOPRESSOR) 25 MG tablet Take 1 tablet by mouth twice daily 05/17/22   [name], PA  ondansetron (ZOFRAN-ODT) 8 MG disintegrating tablet Take 1 tablet (8 mg total) by mouth every 8 (eight) hours as needed for nausea or vomiting. 11/26/22   Linwood Dibbles, MD  pantoprazole  (PROTONIX) 40 MG tablet Take 1 tablet (40 mg total) by mouth 2 (two) times daily before a meal. 02/13/22   [name], PA  polyethylene glycol (MIRALAX / GLYCOLAX) 17 g packet Take 17 g by mouth daily. 01/03/22   Osvaldo Shipper, MD  pravastatin (PRAVACHOL) 10 MG tablet TAKE 1 TABLET BY MOUTH DAILY 10/20/22   [name], PA  prednisoLONE acetate (PRED FORTE) 1 % ophthalmic suspension Place 1 drop into both eyes every 4 (four) hours. 01/03/22   Osvaldo Shipper, MD  senna-docusate (SENOKOT-S) 8.6-50 MG tablet Take 2 tablets by mouth 2 (two) times daily. 01/02/22   Osvaldo Shipper, MD  warfarin (COUMADIN) 5 MG tablet TAKE 1 TABLET BY MOUTH DAILY OR AS DIRECTED BY ANTICOAGULATION CLINIC Patient not taking: Reported on  12/14/2022 05/22/22   [name], PA    Physical Exam: Vitals:   12/18/22 2215 12/18/22 2350 12/19/22 0311 12/19/22 0343  BP: (!) 187/83 (!) 184/102 (!) 153/94   Pulse: 87 76 66   Resp: (!) 23 16 18    Temp:  98.3 F (36.8 C)  98.4 F (36.9 C)  TempSrc:    Oral  SpO2: 96% 95% 96%     Physical Exam Vitals reviewed.  Constitutional:      General: She is not in acute distress. HENT:     Head: Normocephalic and atraumatic.  Eyes:     Extraocular Movements: Extraocular movements intact.  Cardiovascular:     Rate and Rhythm: Normal rate and regular rhythm.     Pulses: Normal pulses.  Pulmonary:     Effort: Pulmonary effort is normal. No respiratory distress.     Breath sounds: Normal breath sounds. No wheezing or rales.  Abdominal:     General: Bowel sounds are normal. There is no distension.     Palpations: Abdomen is soft.     Tenderness: There is no abdominal tenderness. There is no guarding.  Musculoskeletal:     Cervical back: Normal range of motion.     Right lower leg: No edema.     Left lower leg: No edema.  Skin:    General: Skin is warm and dry.  Neurological:     General: No focal deficit present.     Mental Status: She is alert and oriented  to person, place, and time.     Cranial Nerves: No cranial nerve deficit.     Sensory: No sensory deficit.     Motor: No weakness.     Labs on Admission: I have personally reviewed following labs and imaging studies  CBC: Recent Labs  Lab 12/18/22 2150  WBC 8.1  NEUTROABS 6.6  HGB 14.5  HCT 43.2  MCV 87.8  PLT 269   Basic Metabolic Panel: Recent Labs  Lab 12/18/22 2150  NA 136  K 3.8  CL 103  CO2 23  GLUCOSE 152*  BUN 20  CREATININE 0.97  CALCIUM 9.6   GFR: Estimated Creatinine Clearance: 60.6 mL/min (by C-G formula based on SCr of 0.97 mg/dL). Liver Function Tests: Recent Labs  Lab 12/18/22 2150  AST 20  ALT 21  ALKPHOS 107  BILITOT 0.9  PROT 8.1  ALBUMIN 4.1   No results for input(s): "LIPASE", "AMYLASE" in the last 168 hours. No results for input(s): "AMMONIA" in the last 168 hours. Coagulation Profile: Recent Labs  Lab 12/18/22 2150  INR 1.1   Cardiac Enzymes: No results for input(s): "CKTOTAL", "CKMB", "CKMBINDEX", "TROPONINI" in the last 168 hours. BNP (last 3 results) No results for input(s): "PROBNP" in the last 8760 hours. HbA1C: No results for input(s): "HGBA1C" in the last 72 hours. CBG: No results for input(s): "GLUCAP" in the last 168 hours. Lipid Profile: No results for input(s): "CHOL", "HDL", "LDLCALC", "TRIG", "CHOLHDL", "LDLDIRECT" in the last 72 hours. Thyroid Function Tests: No results for input(s): "TSH", "T4TOTAL", "FREET4", "T3FREE", "THYROIDAB" in the last 72 hours. Anemia Panel: No results for input(s): "VITAMINB12", "FOLATE", "FERRITIN", "TIBC", "IRON", "RETICCTPCT" in the last 72 hours. Urine analysis:    Component Value Date/Time   COLORURINE YELLOW 12/18/2022 2228   APPEARANCEUR HAZY (A) 12/18/2022 2228   LABSPEC 1.017 12/18/2022 2228   PHURINE 6.0 12/18/2022 2228   GLUCOSEU NEGATIVE 12/18/2022 2228   GLUCOSEU NEGATIVE 12/10/2017 0924   HGBUR SMALL (A) 12/18/2022 2228  BILIRUBINUR NEGATIVE 12/18/2022 2228    BILIRUBINUR Neg 02/18/2021 1455   KETONESUR NEGATIVE 12/18/2022 2228   PROTEINUR 30 (A) 12/18/2022 2228   UROBILINOGEN 0.2 02/18/2021 1455   UROBILINOGEN 0.2 12/10/2017 0924   NITRITE POSITIVE (A) 12/18/2022 2228   LEUKOCYTESUR LARGE (A) 12/18/2022 2228    Radiological Exams on Admission: DG Chest 2 View  Result Date: 12/18/2022 CLINICAL DATA:  Altered mental status and fever.  Suspected sepsis EXAM: CHEST - 2 VIEW COMPARISON:  11/26/2022 FINDINGS: Stable cardiomediastinal silhouette. Aortic atherosclerotic calcification. No focal consolidation, pleural effusion, or pneumothorax. No displaced rib fractures. IMPRESSION: No acute cardiopulmonary disease. Electronically Signed   By: Minerva Fester M.D.   On: 12/18/2022 22:40    EKG: Independently reviewed. Sinus rhythm, LVH, and T wave inversions inferolaterally. No significant change since previous tracing.   Assessment and Plan  UTI Patient presenting with fever but does not meet any other SIRS criteria.  Mild lactic acidosis likely from dehydration, resolved with IV fluids.  No hypotension.  UA with positive nitrite, large amount of leukocytes, and microscopy showing >50 WBCs and rare bacteria.  Continue ceftriaxone.  Urine and blood cultures pending.   Acute encephalopathy Likely metabolic in nature in the setting of UTI.  Patient is able to answer questions but appears mildly confused at this time.  No focal neurodeficit on exam.  Stat CT head ordered.  Check TSH, B12, and ammonia.   Hypertension Systolic initially in the 180s and was given a dose of oral metoprolol in the ED after which blood pressure improved.  Continue home medications after pharmacy med rec is done.   Hypothyroidism Pharmacy med rec pending.  Check TSH.   CKD stage II-IIIa Renal function stable.  DVT prophylaxis: SCDs Code Status: Full Code (discussed with the patient) Family Communication: No family available at this time. Level of care: Telemetry  bed Admission status: It is my clinical opinion that referral for OBSERVATION is reasonable and necessary in this patient based on the above information provided. The aforementioned taken together are felt to place the patient at high risk for further clinical deterioration. However, it is anticipated that the patient may be medically stable for discharge from the hospital within 24 to 48 hours.  John Giovanni MD [facility]  If 7PM-7AM, please contact night-coverage [email or web address]  12/19/2022, 6:02 AM

## 2022-12-19 NOTE — ED Notes (Signed)
ED TO INPATIENT HANDOFF REPORT  ED Nurse Name and Phone #: Suann Larry Name/Age/Gender Maureen Chatters 72 y.o. female Room/Bed: WA15/WA15  Code Status   Code Status: Full Code  Home/SNF/Other Home Patient oriented to: self, place, time, and situation Is this baseline? Yes   Triage Complete: Triage complete  Chief Complaint UTI (urinary tract infection) [N39.0]  Triage Note Pt arrived from home via [facility] per family completed po abx for recent uti. Per family altered mental status today and fever 101.4 at home 100.2 at triage    Allergies Allergies  Allergen Reactions   Doxycycline Nausea Only   Codeine Nausea And Vomiting   Pollen Extract Other (See Comments)    Unknown reaction     Level of Care/Admitting Diagnosis ED Disposition     ED Disposition  Admit   Condition  --   Comment  Hospital Area: [facility] [[id number]]  Level of Care: Telemetry [5]  Admit to tele based on following criteria: Complex arrhythmia (Bradycardia/Tachycardia)  May admit patient to [facility] or [facility] if equivalent level of care is available:: No  Covid Evaluation: Asymptomatic - no recent exposure (last 10 days) testing not required  Diagnosis: UTI (urinary tract infection) [098119]  Admitting Physician: Lewie Chamber Mellisa.Dayhoff  Attending Physician: Lewie Chamber [phone]  Certification:: I certify this patient will need inpatient services for at least 2 midnights          B Medical/Surgery History Past Medical History:  Diagnosis Date   Allergy    Arthritis    Cataract    Cervical cancer (HCC)    GERD (gastroesophageal reflux disease)    Glaucoma 2017   History of echocardiogram    Echo 9/16:  Mild LVH, EF 55-60%, no RWMA, Gr 1 DD, mild MR // Echo 1/17:  Mild LVH, EF 50-55%, Gr 1 DD, mild to mod MR, mild LAE   Hypertension    Hyperthyroidism    s/p RAI treatment   Palpitations    PONV (postoperative nausea and vomiting)    years ago  . Woke up and  saw asian people    PONV (postoperative nausea and vomiting) 07/18/2021   Retinal detachment    L eye - partial blindness   Tobacco abuse    Tuberculosis    positive test as a caregiver cxr annually    Urinary incontinence    Past Surgical History:  Procedure Laterality Date   ABDOMINAL HYSTERECTOMY  1984   bladder surgey for incontinence     EYE SURGERY     REPAIR OF COMPLEX TRACTION RETINAL DETACHMENT     thyroid radiation     TOTAL ABDOMINAL HYSTERECTOMY W/ BILATERAL SALPINGOOPHORECTOMY     TOTAL KNEE ARTHROPLASTY Right 04/22/2019   Procedure: RIGHT TOTAL KNEE ARTHROPLASTY;  Surgeon: Marcene Corning, MD;  Location: [facility];  Service: Orthopedics;  Laterality: Right;  PENDING PRE-OP FOR SDDC APPROVAL     A IV Location/Drains/Wounds Patient Lines/Drains/Airways Status     Active Line/Drains/Airways     Name Placement date Placement time Site Days   Peripheral IV 12/18/22 20 G Anterior;Left;Proximal Forearm 12/18/22  2130  Forearm  1   Peripheral IV 12/18/22 20 G Anterior;Distal;Right Forearm 12/18/22  2100  Forearm  1            Intake/Output Last 24 hours  Intake/Output Summary (Last 24 hours) at 12/19/2022 1457 Last data filed at 12/19/2022 0611 Gross per 24 hour  Intake 2283.24 ml  Output --  Net 2283.24 ml    Labs/Imaging Results for orders placed or performed during the hospital encounter of 12/18/22 (from the past 48 hour(s))  Culture, blood (Routine x 2)     Status: None (Preliminary result)   Collection Time: 12/18/22  9:26 PM   Specimen: BLOOD  Result Value Ref Range   Specimen Description      BLOOD LEFT ANTECUBITAL Performed at [facility], [address], [place], [place] [address]    Special Requests      BOTTLES DRAWN AEROBIC AND ANAEROBIC Blood Culture adequate volume Performed at [facility], [address], [place], [place] [address]    Culture      NO GROWTH < 12 HOURS Performed at [facility], [address] [place], [place] [address]    Report Status PENDING   I-Stat Lactic Acid, ED     Status: Abnormal   Collection Time: 12/18/22  9:37 PM  Result Value Ref Range   Lactic Acid, Venous 2.5 (HH) 0.5 - 1.9 mmol/L   Comment NOTIFIED PHYSICIAN   Comprehensive metabolic panel     Status: Abnormal   Collection Time: 12/18/22  9:50 PM  Result Value Ref Range   Sodium 136 135 - 145 mmol/L   Potassium 3.8 3.5 - 5.1 mmol/L   Chloride 103 98 - 111 mmol/L   CO2 23 22 - 32 mmol/L   Glucose, Bld 152 (H) 70 - 99 mg/dL    Comment: Glucose reference range applies only to samples taken after fasting for at least 8 hours.   BUN 20 8 - 23 mg/dL   Creatinine, Ser 4.01 0.44 - 1.00 mg/dL   Calcium 9.6 8.9 - 02.7 mg/dL   Total Protein 8.1 6.5 - 8.1 g/dL   Albumin 4.1 3.5 - 5.0 g/dL   AST 20 15 - 41 U/L   ALT 21 0 - 44 U/L   Alkaline Phosphatase 107 38 - 126 U/L   Total Bilirubin 0.9 0.3 - 1.2 mg/dL   GFR, Estimated >25 >36 mL/min    Comment: (NOTE) Calculated using the CKD-EPI Creatinine Equation (2021)    Anion gap 10 5 - 15    Comment: Performed at [facility], [address] [place], [place] [address]  CBC with Differential     Status: None   Collection Time: 12/18/22  9:50 PM  Result Value Ref Range   WBC 8.1 4.0 - 10.5 K/uL   RBC 4.92 3.87 - 5.11 MIL/uL   Hemoglobin 14.5 12.0 - 15.0 g/dL   HCT 47.4 25.9 - 56.3 %   MCV 87.8 80.0 - 100.0 fL   MCH 29.5 26.0 - 34.0 pg   MCHC 33.6 30.0 - 36.0 g/dL   RDW 87.5 64.3 - 32.9 %   Platelets 269 150 - 400 K/uL   nRBC 0.0 0.0 - 0.2 %   Neutrophils Relative % 80 %   Neutro Abs 6.6 1.7 - 7.7 K/uL   Lymphocytes Relative 13 %   Lymphs Abs 1.1 0.7 - 4.0 K/uL   Monocytes Relative 5 %   Monocytes Absolute 0.4 0.1 - 1.0 K/uL   Eosinophils Relative 1 %   Eosinophils Absolute 0.0 0.0 - 0.5 K/uL   Basophils Relative 1 %   Basophils Absolute 0.0 0.0 - 0.1 K/uL   Immature Granulocytes 0 %   Abs Immature Granulocytes 0.03  0.00 - 0.07 K/uL    Comment: Performed at [facility], [address] [place], [place] [address]  Protime-INR     Status: None   Collection Time: 12/18/22  9:50 PM  Result Value Ref Range   Prothrombin Time 13.9 11.4 - 15.2 seconds   INR 1.1 0.8 - 1.2    Comment: (NOTE) INR goal varies based on device and disease states. Performed at [facility], [address], [place], [place] [address]   Culture, blood (Routine x 2)     Status: None (Preliminary result)   Collection Time: 12/18/22  9:52 PM   Specimen: BLOOD  Result Value Ref Range   Specimen Description      BLOOD BLOOD RIGHT HAND Performed at [facility], [address] [place], [place] [address]    Special Requests      BOTTLES DRAWN AEROBIC AND ANAEROBIC Blood Culture adequate volume Performed at [facility], [address], [place], [place] [address]    Culture      NO GROWTH < 12 HOURS Performed at [facility], [address] [place], [place] [address]    Report Status PENDING   Resp panel by RT-PCR (RSV, Flu A&B, Covid) Anterior Nasal Swab     Status: None   Collection Time: 12/18/22 10:12 PM   Specimen: Anterior Nasal Swab  Result Value Ref Range   SARS Coronavirus 2 by RT PCR NEGATIVE NEGATIVE    Comment: (NOTE) SARS-CoV-2 target nucleic acids are NOT DETECTED.  The SARS-CoV-2 RNA is generally detectable in upper respiratory specimens during the acute phase of infection. The lowest concentration of SARS-CoV-2 viral copies this assay can detect is 138 copies/mL. A negative result does not preclude SARS-Cov-2 infection and should not be used as the sole basis for treatment or other patient management decisions. A negative result may occur with  improper specimen collection/handling, submission of specimen other than nasopharyngeal swab, presence of viral mutation(s) within the areas targeted by this assay, and inadequate  number of viral copies(<138 copies/mL). A negative result must be combined with clinical observations, patient history, and epidemiological information. The expected result is Negative.  Fact Sheet for Patients:  BloggerCourse.com  Fact Sheet for Healthcare Providers:  SeriousBroker.it  This test is no t yet approved or cleared by the [place] FDA and  has been authorized for detection and/or diagnosis of SARS-CoV-2 by FDA under an Emergency Use Authorization (EUA). This EUA will remain  in effect (meaning this test can be used) for the duration of the COVID-19 declaration under Section 564(b)(1) of the Act, 21 U.S.C.section 360bbb-3(b)(1), unless the authorization is terminated  or revoked sooner.       Influenza A by PCR NEGATIVE NEGATIVE   Influenza B by PCR NEGATIVE NEGATIVE    Comment: (NOTE) The Xpert Xpress SARS-CoV-2/FLU/RSV plus assay is intended as an aid in the diagnosis of influenza from Nasopharyngeal swab specimens and should not be used as a sole basis for treatment. Nasal washings and aspirates are unacceptable for Xpert Xpress SARS-CoV-2/FLU/RSV testing.  Fact Sheet for Patients: BloggerCourse.com  Fact Sheet for Healthcare Providers: SeriousBroker.it  This test is not yet approved or cleared by the [place] FDA and has been authorized for detection and/or diagnosis of SARS-CoV-2 by FDA under an Emergency Use Authorization (EUA). This EUA will remain in effect (meaning this test can be used) for the duration of the COVID-19 declaration under Section 564(b)(1) of the Act, 21 U.S.C. section 360bbb-3(b)(1), unless the authorization is terminated or revoked.  Resp Syncytial Virus by PCR NEGATIVE NEGATIVE    Comment: (NOTE) Fact Sheet for Patients: BloggerCourse.com  Fact Sheet for Healthcare  Providers: SeriousBroker.it  This test is not yet approved or cleared by the [place] FDA and has been authorized for detection and/or diagnosis of SARS-CoV-2 by FDA under an Emergency Use Authorization (EUA). This EUA will remain in effect (meaning this test can be used) for the duration of the COVID-19 declaration under Section 564(b)(1) of the Act, 21 U.S.C. section 360bbb-3(b)(1), unless the authorization is terminated or revoked.  Performed at [facility], [address] [place], [place] [address]   Urinalysis, w/ Reflex to Culture (Infection Suspected) -Urine, Catheterized     Status: Abnormal   Collection Time: 12/18/22 10:28 PM  Result Value Ref Range   Specimen Source URINE, CATHETERIZED    Color, Urine YELLOW YELLOW   APPearance HAZY (A) CLEAR   Specific Gravity, Urine 1.017 1.005 - 1.030   pH 6.0 5.0 - 8.0   Glucose, UA NEGATIVE NEGATIVE mg/dL   Hgb urine dipstick SMALL (A) NEGATIVE   Bilirubin Urine NEGATIVE NEGATIVE   Ketones, ur NEGATIVE NEGATIVE mg/dL   Protein, ur 30 (A) NEGATIVE mg/dL   Nitrite POSITIVE (A) NEGATIVE   Leukocytes,Ua LARGE (A) NEGATIVE   RBC / HPF 0-5 0 - 5 RBC/hpf   WBC, UA >50 0 - 5 WBC/hpf    Comment:        Reflex urine culture not performed if WBC <=10, OR if Squamous epithelial cells >5. If Squamous epithelial cells >5 suggest recollection.    Bacteria, UA RARE (A) NONE SEEN   Squamous Epithelial / HPF 0-5 0 - 5 /HPF   Mucus PRESENT     Comment: Performed at [facility], [address], [place], [place] [address]  Urine Culture     Status: None (Preliminary result)   Collection Time: 12/18/22 10:28 PM   Specimen: Urine, Catheterized  Result Value Ref Range   Specimen Description      URINE, CATHETERIZED Performed at [facility], [address] [place], [place] [address]    Special Requests      NONE Reflexed from [id number] Performed at [facility], [address], [place], [place] [address]    Culture PENDING    Report Status PENDING   I-Stat Lactic Acid, ED     Status: None   Collection Time: 12/18/22 11:38 PM  Result Value Ref Range   Lactic Acid, Venous 1.6 0.5 - 1.9 mmol/L  TSH     Status: Abnormal   Collection Time: 12/19/22  9:10 AM  Result Value Ref Range   TSH 0.048 (L) 0.350 - 4.500 uIU/mL    Comment: Performed by a 3rd Generation assay with a functional sensitivity of <=0.01 uIU/mL. Performed at [facility], [address], [place], [place] [address]   Vitamin B12     Status: Abnormal   Collection Time: 12/19/22  9:10 AM  Result Value Ref Range   Vitamin B-12 108 (L) 180 - 914 pg/mL    Comment: (NOTE) This assay is not validated for testing neonatal or myeloproliferative syndrome specimens for Vitamin B12 levels. Performed at [facility], [address], [place], [place] [address]   Ammonia     Status: None   Collection Time: 12/19/22 10:07 AM  Result Value Ref Range   Ammonia 34 9 - 35 umol/L    Comment: Performed at [facility], [address], [place], [place] [address]   CT HEAD WO CONTRAST ( )  Result Date: 12/19/2022 CLINICAL DATA:  Delirium.  Altered mental status EXAM: CT HEAD WITHOUT CONTRAST TECHNIQUE: Contiguous axial images were obtained from the base of the skull through the vertex without intravenous contrast. RADIATION DOSE REDUCTION: This exam was performed according to the departmental dose-optimization program which includes automated exposure control, adjustment of the mA and/or kV according to patient size and/or use of iterative reconstruction technique. COMPARISON:  03/08/2013 FINDINGS: Brain: No evidence of acute infarction, hemorrhage, hydrocephalus, extra-axial collection or mass lesion/mass effect. Extensive chronic small vessel ischemia in the cerebral white matter. Moderate brain atrophy. Vascular: Tortuous  and atheromatous intracranial branches attributed to chronic hypertension. Skull: Normal. Negative for fracture or focal lesion. Sinuses/Orbits: Glaucoma reservoir on the right.  No acute finding. IMPRESSION: No acute finding. Extensive chronic small vessel ischemia. Electronically Signed   By: Tiburcio Pea M.D.   On: 12/19/2022 07:11   DG Chest 2 View  Result Date: 12/18/2022 CLINICAL DATA:  Altered mental status and fever.  Suspected sepsis EXAM: CHEST - 2 VIEW COMPARISON:  11/26/2022 FINDINGS: Stable cardiomediastinal silhouette. Aortic atherosclerotic calcification. No focal consolidation, pleural effusion, or pneumothorax. No displaced rib fractures. IMPRESSION: No acute cardiopulmonary disease. Electronically Signed   By: Minerva Fester M.D.   On: 12/18/2022 22:40    Pending Labs Unresulted Labs (From admission, onward)    None       Vitals/Pain Today's Vitals   12/19/22 1200 12/19/22 1227 12/19/22 1230 12/19/22 1300  BP: (!) 168/90  (!) 174/87 (!) 184/95  Pulse: 62  (!) 55 63  Resp: 17  16 16   Temp:  98.3 F (36.8 C)    TempSrc:      SpO2: 97%  95% 98%  PainSc:        Isolation Precautions No active isolations  Medications Medications  cefTRIAXone (ROCEPHIN) 2 g in sodium chloride 0.9 % 100 mL IVPB (0 g Intravenous Stopped 12/18/22 2205)  acetaminophen (TYLENOL) tablet 650 mg (has no administration in time range)    Or  acetaminophen (TYLENOL) suppository 650 mg (has no administration in time range)  lactated ringers bolus 1,000 mL (0 mLs Intravenous Stopped 12/19/22 0048)  acetaminophen (TYLENOL) tablet 1,000 mg (1,000 mg Oral Given 12/18/22 2148)  metoprolol tartrate (LOPRESSOR) tablet 25 mg (25 mg Oral Given 12/19/22 0136)    Mobility walks with person assist     Focused Assessments Cystitis   R Recommendations: See Admitting Provider Note  Report given to:   Additional Notes: .

## 2022-12-19 NOTE — Plan of Care (Signed)
Patient seen and rounded on this morning.  Admitted with altered mentation and concern for ongoing UTI.  She had already been on antibiotic prior to admission for UTI also. Urinalysis noted with large LE, positive nitrite, greater than 50 WBC, rare bacteria.  Urine culture in process.  Plan: -Continue Rocephin - Follow-up urine culture - NH3 normal - B12 low, will start supplement - TSH too early to be rechecked (but improved from prior); follows with Dr. Elvera Lennox, endocrinology; has had recent T4 adjustment and due to repeat check TSH outpatient already; will not adjust dosing at this time; is to remain on 125 mcg daily  - of note, no longer on [facility]; has completed coumadin for prior PE treatment; last seen by pulm in April 2024  Lewie Chamber, MD [facility] 12/19/2022, 2:32 PM

## 2022-12-19 NOTE — ED Notes (Signed)
Phlebotomy called to collect Ammonia.

## 2022-12-20 DIAGNOSIS — N3000 Acute cystitis without hematuria: Secondary | ICD-10-CM | POA: Diagnosis not present

## 2022-12-20 MED ORDER — PANTOPRAZOLE SODIUM 40 MG PO TBEC
40.0000 mg | DELAYED_RELEASE_TABLET | Freq: Every day | ORAL | Status: DC
  Administered 2022-12-21: 40 mg via ORAL
  Filled 2022-12-20: qty 1

## 2022-12-20 MED ORDER — DORZOLAMIDE HCL-TIMOLOL MAL 2-0.5 % OP SOLN: 1.0000 [drp] | Freq: Two times a day (BID) | OPHTHALMIC | Status: DC

## 2022-12-20 MED ORDER — TIMOLOL MALEATE 0.5 % OP SOLN
1.0000 [drp] | Freq: Two times a day (BID) | OPHTHALMIC | Status: DC
  Administered 2022-12-20 – 2022-12-21 (×2): 1 [drp] via OPHTHALMIC
  Filled 2022-12-20: qty 5

## 2022-12-20 MED ORDER — ALBUTEROL SULFATE HFA 108 (90 BASE) MCG/ACT IN AERS: 2.0000 | INHALATION_SPRAY | Freq: Four times a day (QID) | RESPIRATORY_TRACT | Status: DC | PRN

## 2022-12-20 MED ORDER — DORZOLAMIDE HCL 2 % OP SOLN
1.0000 [drp] | Freq: Two times a day (BID) | OPHTHALMIC | Status: DC
  Administered 2022-12-20 – 2022-12-21 (×2): 1 [drp] via OPHTHALMIC
  Filled 2022-12-20: qty 10

## 2022-12-20 MED ORDER — LEVOTHYROXINE SODIUM 125 MCG PO TABS
125.0000 ug | ORAL_TABLET | Freq: Every day | ORAL | Status: DC
  Administered 2022-12-21: 125 ug via ORAL
  Filled 2022-12-20: qty 1

## 2022-12-20 MED ORDER — LATANOPROST 0.005 % OP SOLN
1.0000 [drp] | Freq: Every day | OPHTHALMIC | Status: DC
  Administered 2022-12-20: 1 [drp] via OPHTHALMIC
  Filled 2022-12-20: qty 2.5

## 2022-12-20 MED ORDER — METOPROLOL TARTRATE 25 MG PO TABS
25.0000 mg | ORAL_TABLET | Freq: Two times a day (BID) | ORAL | Status: DC
  Administered 2022-12-20 – 2022-12-21 (×2): 25 mg via ORAL
  Filled 2022-12-20 (×2): qty 1

## 2022-12-20 MED ORDER — BRIMONIDINE TARTRATE 0.2 % OP SOLN
1.0000 [drp] | Freq: Two times a day (BID) | OPHTHALMIC | Status: DC
  Administered 2022-12-20 – 2022-12-21 (×2): 1 [drp] via OPHTHALMIC
  Filled 2022-12-20: qty 5

## 2022-12-20 NOTE — Progress Notes (Signed)
MEWS Progress Note  Patient Details Name: Kari Coleman MRN: [id number] DOB: 1950/07/19 Today's Date: 12/20/2022   MEWS Flowsheet Documentation:  Assess: MEWS Score Temp: 98.6 F (37 C) BP: (!) 159/81 MAP (mmHg): 106 Pulse Rate: 74 ECG Heart Rate: 60 Resp: 20 Level of Consciousness: Alert SpO2: 100 % O2 Device: Room Air Assess: MEWS Score MEWS Temp: 0 MEWS Systolic: 0 MEWS Pulse: 0 MEWS RR: 0 MEWS LOC: 0 MEWS Score: 0 MEWS Score Color: Green Assess: SIRS CRITERIA SIRS Temperature : 0 SIRS Respirations : 0 SIRS Pulse: 0 SIRS WBC: 0 SIRS Score Sum : 0 Assess: if the MEWS score is Yellow or Red Were vital signs accurate and taken at a resting state?: Yes Does the patient meet 2 or more of the SIRS criteria?: Yes Does the patient have a confirmed or suspected source of infection?: Yes MEWS guidelines implemented : Yes, yellow Treat MEWS Interventions: Considered administering scheduled or prn medications/treatments as ordered Take Vital Signs Increase Vital Sign Frequency : Yellow: Q2hr x1, continue Q4hrs until patient remains green for 12hrs Escalate MEWS: Escalate: Yellow: Discuss with charge nurse and consider notifying provider and/or RRT    Devynn Hessler Shary Key 12/20/2022, 4:41 AM

## 2022-12-20 NOTE — Progress Notes (Signed)
PROGRESS NOTE    Kari Coleman  ONG:[id number] DOB: 1950/10/14 DOA: 12/18/2022 PCP: [name], PA    Brief Narrative:  Kari Coleman is a 72 y.o. 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  Family reported that the patient had recently finished an antibiotic for UTI.  Temperature 100.2 F on arrival to ED and hypertensive with systolic in the 180s.  Not tachycardic.  Labs showing no leukocytosis, sodium 136, glucose 152, lactic acid 2.5> 1.6, blood cultures collected, COVID/influenza/RSV PCR negative.  UA with positive nitrite, large amount of leukocytes, and microscopy showing >50 WBCs and rare bacteria.  Urine culture pending.  Chest x-ray showing no acute cardiopulmonary disease. Patient was given Tylenol, oral metoprolol, ceftriaxone, and 1 L LR.  TR[facility] called to admit.     Patient appears mildly confused at this time.  States her daughter called EMS as she was confused. She reports being treated for UTI recently. Denies any urinary symptoms at this time. She is reporting cough but denies fevers, chest pain, or shortness of breath. Denies nausea, vomiting, abdominal pain, or diarrhea.    Assessment and Plan: UTI Patient presenting with fever but does not meet any other SIRS criteria.  Mild lactic acidosis likely from dehydration, resolved with IV fluids.  No hypotension.  UA with positive nitrite, large amount of leukocytes, and microscopy showing >50 WBCs and rare bacteria.  Continue ceftriaxone.  - Urine and blood cultures pending.   Acute encephalopathy Likely metabolic in nature in the setting of UTI.   -improved   Hypertension -resume home meds   Hypothyroidism -TSH too early to be rechecked (but improved  from prior); follows with Dr. Elvera Lennox, endocrinology; has had recent T4 adjustment and due to repeat check TSH outpatient already; will not adjust dosing at this time; is to remain on 125 mcg daily    CKD stage II-IIIa Renal function stable.  Low B12 -replete  Obesity Estimated body mass index is 32.1 kg/m as calculated from the following:   Height as of 12/14/22: 5' 7.5" (1.715 m).   Weight as of an earlier encounter on 12/18/22: 94.3 kg.   DVT prophylaxis: SCDs Start: 12/19/22 0603    Code Status: Full Code Family Communication:   Disposition Plan:  Level of care: Med-Surg Status is: Inpatient     Consultants:  none   Subjective: Feeling better  Objective: Vitals:   12/20/22 0349 12/20/22 0746 12/20/22 0817 12/20/22 1125  BP: (!) 159/81 (!) 158/90  (!) 136/116  Pulse: 74 61  82  Resp: 20 16  17   Temp: 98.6 F (37 C) 98.1 F (36.7 C)  98.1 F (36.7 C)  TempSrc: Oral Oral  Oral  SpO2: 100% 96% 96% 95%    Intake/Output Summary (Last 24 hours) at 12/20/2022 1233 Last data filed at 12/20/2022 4401 Gross per 24 hour  Intake 580 ml  Output 575 ml  Net 5 ml   There were no vitals filed for this visit.  Examination:   General: Appearance:    Obese female in no acute distress     Lungs:     respirations unlabored  Heart:    Normal heart rate. Normal rhythm. No murmurs, rubs, or gallops.    MS:   All extremities are intact.  Neurologic:   Awake, alert, oriented x 3. No apparent focal neurological           defect.        Data Reviewed: I have personally reviewed following labs and imaging studies  CBC: Recent Labs  Lab 12/18/22 2150  WBC 8.1  NEUTROABS 6.6  HGB 14.5  HCT 43.2  MCV 87.8  PLT 269   Basic Metabolic Panel: Recent Labs  Lab 12/18/22 2150  NA 136  K 3.8  CL 103  CO2 23  GLUCOSE 152*  BUN 20  CREATININE 0.97  CALCIUM 9.6   GFR: Estimated Creatinine Clearance: 60.6 mL/min (by C-G formula based on SCr of 0.97 mg/dL). Liver  Function Tests: Recent Labs  Lab 12/18/22 2150  AST 20  ALT 21  ALKPHOS 107  BILITOT 0.9  PROT 8.1  ALBUMIN 4.1   No results for input(s): "LIPASE", "AMYLASE" in the last 168 hours. Recent Labs  Lab 12/19/22 1007  AMMONIA 34   Coagulation Profile: Recent Labs  Lab 12/18/22 2150  INR 1.1   Cardiac Enzymes: No results for input(s): "CKTOTAL", "CKMB", "CKMBINDEX", "TROPONINI" in the last 168 hours. BNP (last 3 results) No results for input(s): "PROBNP" in the last 8760 hours. HbA1C: No results for input(s): "HGBA1C" in the last 72 hours. CBG: No results for input(s): "GLUCAP" in the last 168 hours. Lipid Profile: No results for input(s): "CHOL", "HDL", "LDLCALC", "TRIG", "CHOLHDL", "LDLDIRECT" in the last 72 hours. Thyroid Function Tests: Recent Labs    12/19/22 0910  TSH 0.048*   Anemia Panel: Recent Labs    12/19/22 0910  VITAMINB12 108*   Sepsis Labs: Recent Labs  Lab 12/18/22 2137 12/18/22 2338  LATICACIDVEN 2.5* 1.6    Recent Results (from the past 240 hour(s))  Culture, blood (Routine x 2)     Status: None (Preliminary result)   Collection Time: 12/18/22  9:26 PM   Specimen: BLOOD  Result Value Ref Range Status   Specimen Description   Final    BLOOD LEFT ANTECUBITAL Performed at [facility], [address] [place], [place] [address]    Special Requests   Final    BOTTLES DRAWN AEROBIC AND ANAEROBIC Blood Culture adequate volume Performed at [facility], [address] [place], [place] [address]    Culture   Final    NO GROWTH 2 DAYS Performed at [facility], [address] [place], [place] [address]    Report Status PENDING  Incomplete  Culture, blood (Routine x 2)     Status: None (Preliminary result)   Collection Time: 12/18/22  9:52 PM   Specimen: BLOOD  Result Value Ref Range Status   Specimen Description   Final    BLOOD BLOOD RIGHT HAND Performed at [facility],  [address] [place], [place] [address]    Special Requests   Final    BOTTLES DRAWN AEROBIC AND ANAEROBIC Blood Culture adequate volume Performed at [facility], [address] [place], [place] [address]    Culture   Final    NO GROWTH 2 DAYS Performed at [facility], [address] [place], [place] [address]    Report Status PENDING  Incomplete  Resp panel by RT-PCR (RSV, Flu A&B, Covid) Anterior Nasal Swab     Status: None   Collection Time: 12/18/22 10:12 PM   Specimen: Anterior Nasal Swab  Result Value Ref Range Status   SARS Coronavirus  2 by RT PCR NEGATIVE NEGATIVE Final    Comment: (NOTE) SARS-CoV-2 target nucleic acids are NOT DETECTED.  The SARS-CoV-2 RNA is generally detectable in upper respiratory specimens during the acute phase of infection. The lowest concentration of SARS-CoV-2 viral copies this assay can detect is 138 copies/mL. A negative result does not preclude SARS-Cov-2 infection and should not be used as the sole basis for treatment or other patient management decisions. A negative result may occur with  improper specimen collection/handling, submission of specimen other than nasopharyngeal swab, presence of viral mutation(s) within the areas targeted by this assay, and inadequate number of viral copies(<138 copies/mL). A negative result must be combined with clinical observations, patient history, and epidemiological information. The expected result is Negative.  Fact Sheet for Patients:  BloggerCourse.com  Fact Sheet for Healthcare Providers:  SeriousBroker.it  This test is no t yet approved or cleared by the [place] FDA and  has been authorized for detection and/or diagnosis of SARS-CoV-2 by FDA under an Emergency Use Authorization (EUA). This EUA will remain  in effect (meaning this test can be used) for the duration of the COVID-19 declaration under Section  564(b)(1) of the Act, 21 U.S.C.section 360bbb-3(b)(1), unless the authorization is terminated  or revoked sooner.       Influenza A by PCR NEGATIVE NEGATIVE Final   Influenza B by PCR NEGATIVE NEGATIVE Final    Comment: (NOTE) The Xpert Xpress SARS-CoV-2/FLU/RSV plus assay is intended as an aid in the diagnosis of influenza from Nasopharyngeal swab specimens and should not be used as a sole basis for treatment. Nasal washings and aspirates are unacceptable for Xpert Xpress SARS-CoV-2/FLU/RSV testing.  Fact Sheet for Patients: BloggerCourse.com  Fact Sheet for Healthcare Providers: SeriousBroker.it  This test is not yet approved or cleared by the [place] FDA and has been authorized for detection and/or diagnosis of SARS-CoV-2 by FDA under an Emergency Use Authorization (EUA). This EUA will remain in effect (meaning this test can be used) for the duration of the COVID-19 declaration under Section 564(b)(1) of the Act, 21 U.S.C. section 360bbb-3(b)(1), unless the authorization is terminated or revoked.     Resp Syncytial Virus by PCR NEGATIVE NEGATIVE Final    Comment: (NOTE) Fact Sheet for Patients: BloggerCourse.com  Fact Sheet for Healthcare Providers: SeriousBroker.it  This test is not yet approved or cleared by the [place] FDA and has been authorized for detection and/or diagnosis of SARS-CoV-2 by FDA under an Emergency Use Authorization (EUA). This EUA will remain in effect (meaning this test can be used) for the duration of the COVID-19 declaration under Section 564(b)(1) of the Act, 21 U.S.C. section 360bbb-3(b)(1), unless the authorization is terminated or revoked.  Performed at [facility], [address] [place], [place] [address]   Urine Culture     Status: Abnormal (Preliminary result)   Collection Time: 12/18/22 10:28 PM    Specimen: Urine, Catheterized  Result Value Ref Range Status   Specimen Description   Final    URINE, CATHETERIZED Performed at [facility], [address] [place], [place] [address]    Special Requests   Final    NONE Reflexed from [id number] Performed at [facility], [address] [place], [place] [address]    Culture (A)  Final    30,000 COLONIES/mL ESCHERICHIA COLI SUSCEPTIBILITIES TO FOLLOW Performed at [facility], [address], [place], [place] [address]    Report Status PENDING  Incomplete  Radiology Studies: CT HEAD WO CONTRAST ( )  Result Date: 12/19/2022 CLINICAL DATA:  Delirium.  Altered mental status EXAM: CT HEAD WITHOUT CONTRAST TECHNIQUE: Contiguous axial images were obtained from the base of the skull through the vertex without intravenous contrast. RADIATION DOSE REDUCTION: This exam was performed according to the departmental dose-optimization program which includes automated exposure control, adjustment of the mA and/or kV according to patient size and/or use of iterative reconstruction technique. COMPARISON:  03/08/2013 FINDINGS: Brain: No evidence of acute infarction, hemorrhage, hydrocephalus, extra-axial collection or mass lesion/mass effect. Extensive chronic small vessel ischemia in the cerebral white matter. Moderate brain atrophy. Vascular: Tortuous and atheromatous intracranial branches attributed to chronic hypertension. Skull: Normal. Negative for fracture or focal lesion. Sinuses/Orbits: Glaucoma reservoir on the right.  No acute finding. IMPRESSION: No acute finding. Extensive chronic small vessel ischemia. Electronically Signed   By: Tiburcio Pea M.D.   On: 12/19/2022 07:11   DG Chest 2 View  Result Date: 12/18/2022 CLINICAL DATA:  Altered mental status and fever.  Suspected sepsis EXAM: CHEST - 2 VIEW COMPARISON:  11/26/2022 FINDINGS: Stable cardiomediastinal silhouette. Aortic atherosclerotic  calcification. No focal consolidation, pleural effusion, or pneumothorax. No displaced rib fractures. IMPRESSION: No acute cardiopulmonary disease. Electronically Signed   By: Minerva Fester M.D.   On: 12/18/2022 22:40        Scheduled Meds:  amLODipine  2.5 mg Oral Daily   cyanocobalamin  1,000 mcg Subcutaneous Daily   lisinopril  40 mg Oral Daily   mometasone-formoterol  2 puff Inhalation BID   multivitamin with minerals  1 tablet Oral Daily   Continuous Infusions:  cefTRIAXone (ROCEPHIN)  IV 2 g (12/19/22 2133)     LOS: 1 day    Time spent: 45 minutes spent on chart review, discussion with nursing staff, consultants, updating family and interview/physical exam; more than 50% of that time was spent in counseling and/or coordination of care.    Joseph Art, DO [facility] Available via Epic secure chat 7am-7pm After these hours, please refer to coverage provider listed on [email or web address] 12/20/2022, 12:33 PM

## 2022-12-20 NOTE — Plan of Care (Signed)
  Problem: Urinary Elimination: Goal: Signs and symptoms of infection will decrease 12/20/2022 0419 by Precious Bard, RN Outcome: Progressing 12/20/2022 0419 by Precious Bard, RN Outcome: Progressing   Problem: Safety: Goal: Ability to remain free from injury will improve 12/20/2022 0419 by Precious Bard, RN Outcome: Progressing 12/20/2022 0419 by Precious Bard, RN Outcome: Progressing   Problem: Skin Integrity: Goal: Risk for impaired skin integrity will decrease 12/20/2022 0419 by Precious Bard, RN Outcome: Progressing 12/20/2022 0419 by Precious Bard, RN Outcome: Progressing

## 2022-12-20 NOTE — Plan of Care (Signed)
  Problem: Education: Goal: Knowledge of General Education information will improve Description: Including pain rating scale, medication(s)/side effects and non-pharmacologic comfort measures Outcome: Progressing   Problem: Activity: Goal: Risk for activity intolerance will decrease Outcome: Progressing   Problem: Coping: Goal: Level of anxiety will decrease Outcome: Progressing   Problem: Skin Integrity: Goal: Risk for impaired skin integrity will decrease Outcome: Progressing   Problem: Urinary Elimination: Goal: Signs and symptoms of infection will decrease Outcome: Progressing

## 2022-12-20 NOTE — Progress Notes (Signed)
Mobility Specialist - Progress Note   12/20/22 1415  Mobility  Activity Ambulated with assistance in hallway  Level of Assistance Contact guard assist, steadying assist  Assistive Device Front wheel walker  Distance Ambulated (ft) 200 ft  Range of Motion/Exercises Active  Activity Response Tolerated well  Mobility Referral Yes  $Mobility charge 1 Mobility  Mobility Specialist Start Time (ACUTE ONLY) 1400  Mobility Specialist Stop Time (ACUTE ONLY) 1415  Mobility Specialist Time Calculation (min) (ACUTE ONLY) 15 min   Pt received in restroom and agreed to mobility, had no issues throughout session and returned to bed with all needs met and alarm on.  Marilynne Halsted Mobility Specialist

## 2022-12-21 ENCOUNTER — Other Ambulatory Visit (HOSPITAL_COMMUNITY): Payer: Self-pay

## 2022-12-21 DIAGNOSIS — I10 Essential (primary) hypertension: Secondary | ICD-10-CM | POA: Diagnosis not present

## 2022-12-21 DIAGNOSIS — N3000 Acute cystitis without hematuria: Secondary | ICD-10-CM | POA: Diagnosis not present

## 2022-12-21 LAB — BASIC METABOLIC PANEL
Anion gap: 11 (ref 5–15)
BUN: 21 mg/dL (ref 8–23)
CO2: 25 mmol/L (ref 22–32)
Calcium: 9.2 mg/dL (ref 8.9–10.3)
Chloride: 102 mmol/L (ref 98–111)
Creatinine, Ser: 1.06 mg/dL — ABNORMAL HIGH (ref 0.44–1.00)
GFR, Estimated: 55 mL/min — ABNORMAL LOW (ref >60)
Glucose, Bld: 110 mg/dL — ABNORMAL HIGH (ref 70–99)
Potassium: 3.8 mmol/L (ref 3.5–5.1)
Sodium: 138 mmol/L (ref 135–145)

## 2022-12-21 LAB — URINE CULTURE: Culture: 30000 — AB

## 2022-12-21 LAB — CBC
HCT: 39.2 % (ref 36.0–46.0)
Hemoglobin: 12.9 g/dL (ref 12.0–15.0)
MCH: 29.3 pg (ref 26.0–34.0)
MCHC: 32.9 g/dL (ref 30.0–36.0)
MCV: 89.1 fL (ref 80.0–100.0)
Platelets: 238 10*3/uL (ref 150–400)
RBC: 4.4 MIL/uL (ref 3.87–5.11)
RDW: 12.8 % (ref 11.5–15.5)
WBC: 6.2 10*3/uL (ref 4.0–10.5)
nRBC: 0 % (ref 0.0–0.2)

## 2022-12-21 MED ORDER — CEFDINIR 300 MG PO CAPS
300.0000 mg | ORAL_CAPSULE | Freq: Two times a day (BID) | ORAL | Status: DC
  Filled 2022-12-21: qty 1

## 2022-12-21 MED ORDER — CEFADROXIL 500 MG PO CAPS
500.0000 mg | ORAL_CAPSULE | Freq: Two times a day (BID) | ORAL | 0 refills | Status: DC
  Filled 2022-12-21: qty 6, 3d supply, fill #0

## 2022-12-21 MED ORDER — VITAMIN B-12 1000 MCG PO TABS
1000.0000 ug | ORAL_TABLET | Freq: Every day | ORAL | 2 refills | Status: DC
  Filled 2022-12-21: qty 30, 30d supply, fill #0

## 2022-12-21 MED ORDER — SENNA 8.6 MG PO TABS: 1.0000 | ORAL_TABLET | Freq: Every day | ORAL | Status: DC

## 2022-12-21 MED ORDER — CEFADROXIL 500 MG PO CAPS
500.0000 mg | ORAL_CAPSULE | Freq: Two times a day (BID) | ORAL | Status: DC
  Filled 2022-12-21: qty 1

## 2022-12-21 NOTE — Plan of Care (Signed)
  Problem: Education: Goal: Knowledge of General Education information will improve Description: Including pain rating scale, medication(s)/side effects and non-pharmacologic comfort measures Outcome: Progressing   Problem: Clinical Measurements: Goal: Ability to maintain clinical measurements within normal limits will improve Outcome: Progressing   Problem: Activity: Goal: Risk for activity intolerance will decrease Outcome: Progressing   Problem: Nutrition: Goal: Adequate nutrition will be maintained Outcome: Progressing   Problem: Pain Managment: Goal: General experience of comfort will improve Outcome: Progressing   Problem: Safety: Goal: Ability to remain free from injury will improve Outcome: Progressing   Problem: Skin Integrity: Goal: Risk for impaired skin integrity will decrease Outcome: Progressing   Problem: Urinary Elimination: Goal: Signs and symptoms of infection will decrease Outcome: Progressing

## 2022-12-21 NOTE — Progress Notes (Signed)
   12/21/22 1340  TOC Brief Assessment  Insurance and Status Reviewed  Patient has primary care physician Yes  Home environment has been reviewed Home  Prior level of function: Independent  Prior/Current Home Services No current home services  Social Determinants of Health Reivew SDOH reviewed no interventions necessary  Readmission risk has been reviewed Yes  Transition of care needs no transition of care needs at this time

## 2022-12-21 NOTE — Discharge Summary (Signed)
Physician Discharge Summary  Kari Coleman:[id number] DOB: 1951/01/22 DOA: 12/18/2022  PCP: Kari Motto, PA  Admit date: 12/18/2022 Discharge date: 12/21/2022  Admitted From: home Discharge disposition: home   Recommendations for Outpatient Follow-Up:   Follow B12 and TSH outpatient   Discharge Diagnosis:   Principal Problem:   UTI (urinary tract infection) Active Problems:   Essential hypertension   Hypothyroidism   Acute encephalopathy    Discharge Condition: Improved.  Diet recommendation:  Regular.  Wound care: None.  Code status: Full.   History of Present Illness:   Kari Coleman is a 72 y.o. 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  Family reported that the patient had recently finished an antibiotic for UTI.  Temperature 100.2 F on arrival to ED and hypertensive with systolic in the 180s.  Not tachycardic.  Labs showing no leukocytosis, sodium 136, glucose 152, lactic acid 2.5> 1.6, blood cultures collected, COVID/influenza/RSV PCR negative.  UA with positive nitrite, large amount of leukocytes, and microscopy showing >50 WBCs and rare bacteria.  Urine culture pending.  Chest x-ray showing no acute cardiopulmonary disease. Patient was given Tylenol, oral metoprolol, ceftriaxone, and 1 L LR.  TRH called to admit.     Patient appears mildly confused at this time.  States her daughter called EMS as she was confused. She reports being treated for UTI recently. Denies any urinary symptoms at this time. She is reporting cough but denies fevers, chest pain, or shortness of breath. Denies nausea, vomiting, abdominal pain, or diarrhea.    Hospital Course by Problem:   UTI Patient  presenting with fever but does not meet any other SIRS criteria.  Mild lactic acidosis likely from dehydration, resolved with IV fluids.  No hypotension.  UA with positive nitrite, large amount of leukocytes, and microscopy showing >50 WBCs and rare bacteria.   -urine culture: e coli   Acute encephalopathy Likely metabolic in nature in the setting of UTI.   -improved   Hypertension -resume home meds   Hypothyroidism -TSH too early to be rechecked (but improved from prior); follows with Kari Coleman, endocrinology; has had recent T4 adjustment and due to repeat check TSH outpatient already; will not adjust dosing at this time; is to remain on 125 mcg daily    CKD stage II-IIIa Renal function stable.   Low B12 -replete   Obesity Estimated body mass index is 32.1 kg/m as calculated from the following:   Height as of 12/14/22: 5' 7.5" (1.715 m).   Weight as of an earlier encounter on 12/18/22: 94.3 kg.     Medical Consultants:      Discharge Exam:   Vitals:   12/21/22 0432 12/21/22 0756  BP: (!) 156/91   Pulse: 65   Resp: 20   Temp: 98.3 F (36.8 C)   SpO2: 92% 93%   Vitals:   12/20/22 1907 12/20/22 2034 12/21/22 0432 12/21/22 0756  BP: (!) 148/83  (!) 156/91   Pulse: 80  65   Resp: 20  20   Temp: 99.5 F (37.5 C)  98.3 F (36.8 C)   TempSrc: Oral     SpO2: 94% 93% 92% 93%    General exam: Appears calm and comfortable. Asking to go home  The results of significant diagnostics from this hospitalization (including imaging, microbiology, ancillary and laboratory) are listed below for reference.     Procedures and Diagnostic Studies:   CT HEAD WO CONTRAST ( )  Result Date: 12/19/2022 CLINICAL DATA:  Delirium.  Altered mental status EXAM: CT HEAD WITHOUT CONTRAST TECHNIQUE: Contiguous axial images were obtained from the base of the skull through the vertex without intravenous contrast. RADIATION DOSE REDUCTION: This exam was performed according to the  departmental dose-optimization program which includes automated exposure control, adjustment of the mA and/or kV according to patient size and/or use of iterative reconstruction technique. COMPARISON:  03/08/2013 FINDINGS: Brain: No evidence of acute infarction, hemorrhage, hydrocephalus, extra-axial collection or mass lesion/mass effect. Extensive chronic small vessel ischemia in the cerebral white matter. Moderate brain atrophy. Vascular: Tortuous and atheromatous intracranial branches attributed to chronic hypertension. Skull: Normal. Negative for fracture or focal lesion. Sinuses/Orbits: Glaucoma reservoir on the right.  No acute finding. IMPRESSION: No acute finding. Extensive chronic small vessel ischemia. Electronically Signed   By: Tiburcio Pea M.D.   On: 12/19/2022 07:11   DG Chest 2 View  Result Date: 12/18/2022 CLINICAL DATA:  Altered mental status and fever.  Suspected sepsis EXAM: CHEST - 2 VIEW COMPARISON:  11/26/2022 FINDINGS: Stable cardiomediastinal silhouette. Aortic atherosclerotic calcification. No focal consolidation, pleural effusion, or pneumothorax. No displaced rib fractures. IMPRESSION: No acute cardiopulmonary disease. Electronically Signed   By: Minerva Fester M.D.   On: 12/18/2022 22:40     Labs:   Basic Metabolic Panel: Recent Labs  Lab 12/18/22 2150 12/21/22 0552  NA 136 138  K 3.8 3.8  CL 103 102  CO2 23 25  GLUCOSE 152* 110*  BUN 20 21  CREATININE 0.97 1.06*  CALCIUM 9.6 9.2   GFR Estimated Creatinine Clearance: 55.4 mL/min (A) (by C-G formula based on SCr of 1.06 mg/dL (H)). Liver Function Tests: Recent Labs  Lab 12/18/22 2150  AST 20  ALT 21  ALKPHOS 107  BILITOT 0.9  PROT 8.1  ALBUMIN 4.1   No results for input(s): "LIPASE", "AMYLASE" in the last 168 hours. Recent Labs  Lab 12/19/22 1007  AMMONIA 34   Coagulation profile Recent Labs  Lab 12/18/22 2150  INR 1.1    CBC: Recent Labs  Lab 12/18/22 2150 12/21/22 0552  WBC 8.1  6.2  NEUTROABS 6.6  --   HGB 14.5 12.9  HCT 43.2 39.2  MCV 87.8 89.1  PLT 269 238   Cardiac Enzymes: No results for input(s): "CKTOTAL", "CKMB", "CKMBINDEX", "TROPONINI" in the last 168 hours. BNP: Invalid input(s): "POCBNP" CBG: No results for input(s): "GLUCAP" in the last 168 hours. D-Dimer No results for input(s): "DDIMER" in the last 72 hours. Hgb A1c No results for input(s): "HGBA1C" in the last 72 hours. Lipid Profile No results for input(s): "CHOL", "HDL", "LDLCALC", "TRIG", "CHOLHDL", "LDLDIRECT" in the last 72 hours. Thyroid function studies Recent Labs    12/19/22 0910  TSH 0.048*   Anemia work up Recent Labs    12/19/22 0910  VITAMINB12 108*   Microbiology Recent Results (from the past 240 hour(s))  Culture, blood (Routine x 2)     Status: None (Preliminary result)   Collection Time: 12/18/22  9:26 PM   Specimen: BLOOD  Result Value Ref Range Status   Specimen Description   Final    BLOOD LEFT ANTECUBITAL Performed at [facility], [address] [place], [place] [address]    Special Requests   Final  BOTTLES DRAWN AEROBIC AND ANAEROBIC Blood Culture adequate volume Performed at [facility], [address] [place], [place] [address]    Culture   Final    NO GROWTH 3 DAYS Performed at [facility], [address], [place], [place] [address]    Report Status PENDING  Incomplete  Culture, blood (Routine x 2)     Status: None (Preliminary result)   Collection Time: 12/18/22  9:52 PM   Specimen: BLOOD RIGHT HAND  Result Value Ref Range Status   Specimen Description   Final    BLOOD RIGHT HAND Performed at [facility], [address] [place], [place] [address]    Special Requests   Final    BOTTLES DRAWN AEROBIC AND ANAEROBIC Blood Culture adequate volume Performed at [facility], [address] [place], [place] [address]    Culture   Final    NO GROWTH 3 DAYS Performed  at [facility], [address], [place], [place] [address]    Report Status PENDING  Incomplete  Resp panel by RT-PCR (RSV, Flu A&B, Covid) Anterior Nasal Swab     Status: None   Collection Time: 12/18/22 10:12 PM   Specimen: Anterior Nasal Swab  Result Value Ref Range Status   SARS Coronavirus 2 by RT PCR NEGATIVE NEGATIVE Final    Comment: (NOTE) SARS-CoV-2 target nucleic acids are NOT DETECTED.  The SARS-CoV-2 RNA is generally detectable in upper respiratory specimens during the acute phase of infection. The lowest concentration of SARS-CoV-2 viral copies this assay can detect is 138 copies/mL. A negative result does not preclude SARS-Cov-2 infection and should not be used as the sole basis for treatment or other patient management decisions. A negative result may occur with  improper specimen collection/handling, submission of specimen other than nasopharyngeal swab, presence of viral mutation(s) within the areas targeted by this assay, and inadequate number of viral copies(<138 copies/mL). A negative result must be combined with clinical observations, patient history, and epidemiological information. The expected result is Negative.  Fact Sheet for Patients:  BloggerCourse.com  Fact Sheet for Healthcare Providers:  SeriousBroker.it  This test is no t yet approved or cleared by the [place] FDA and  has been authorized for detection and/or diagnosis of SARS-CoV-2 by FDA under an Emergency Use Authorization (EUA). This EUA will remain  in effect (meaning this test can be used) for the duration of the COVID-19 declaration under Section 564(b)(1) of the Act, 21 U.S.C.section 360bbb-3(b)(1), unless the authorization is terminated  or revoked sooner.       Influenza A by PCR NEGATIVE NEGATIVE Final   Influenza B by PCR NEGATIVE NEGATIVE Final    Comment: (NOTE) The Xpert Xpress SARS-CoV-2/FLU/RSV plus assay is  intended as an aid in the diagnosis of influenza from Nasopharyngeal swab specimens and should not be used as a sole basis for treatment. Nasal washings and aspirates are unacceptable for Xpert Xpress SARS-CoV-2/FLU/RSV testing.  Fact Sheet for Patients: BloggerCourse.com  Fact Sheet for Healthcare Providers: SeriousBroker.it  This test is not yet approved or cleared by the [place] FDA and has been authorized for detection and/or diagnosis of SARS-CoV-2 by FDA under an Emergency Use Authorization (EUA). This EUA will remain in effect (meaning this test can be used) for the duration of the COVID-19 declaration under Section 564(b)(1) of the Act, 21 U.S.C. section 360bbb-3(b)(1), unless the authorization is terminated or revoked.     Resp Syncytial Virus  by PCR NEGATIVE NEGATIVE Final    Comment: (NOTE) Fact Sheet for Patients: BloggerCourse.com  Fact Sheet for Healthcare Providers: SeriousBroker.it  This test is not yet approved or cleared by the [place] FDA and has been authorized for detection and/or diagnosis of SARS-CoV-2 by FDA under an Emergency Use Authorization (EUA). This EUA will remain in effect (meaning this test can be used) for the duration of the COVID-19 declaration under Section 564(b)(1) of the Act, 21 U.S.C. section 360bbb-3(b)(1), unless the authorization is terminated or revoked.  Performed at [facility], [address], [place], [place] [address]   Urine Culture     Status: Abnormal   Collection Time: 12/18/22 10:28 PM   Specimen: Urine, Catheterized  Result Value Ref Range Status   Specimen Description   Final    URINE, CATHETERIZED Performed at [facility], [address] [place], [place] [address]    Special Requests   Final    NONE Reflexed from [id number] Performed at [facility], [address], [place], [place] [address]    Culture 30,000 COLONIES/mL ESCHERICHIA COLI (A)  Final   Report Status 12/21/2022 FINAL  Final   Organism ID, Bacteria ESCHERICHIA COLI (A)  Final      Susceptibility   Escherichia coli - MIC*    AMPICILLIN 8 SENSITIVE Sensitive     CEFAZOLIN <=4 SENSITIVE Sensitive     CEFEPIME <=0.12 SENSITIVE Sensitive     CEFTRIAXONE <=0.25 SENSITIVE Sensitive     CIPROFLOXACIN <=0.25 SENSITIVE Sensitive     GENTAMICIN <=1 SENSITIVE Sensitive     IMIPENEM <=0.25 SENSITIVE Sensitive     NITROFURANTOIN <=16 SENSITIVE Sensitive     TRIMETH/SULFA <=20 SENSITIVE Sensitive     AMPICILLIN/SULBACTAM <=2 SENSITIVE Sensitive     PIP/TAZO <=4 SENSITIVE Sensitive     * 30,000 COLONIES/mL ESCHERICHIA COLI     Discharge Instructions:   Discharge Instructions     Diet - low sodium heart healthy   Complete by: As directed    Increase activity slowly   Complete by: As directed       Allergies as of 12/21/2022       Reactions   Doxycycline Nausea Only   Codeine Nausea And Vomiting   Pollen Extract Itching, Other (See Comments)   Congestion, itchy eyes, runny nose        Medication List     STOP taking these medications    budesonide-formoterol 80-4.5 MCG/ACT inhaler Commonly known as: SYMBICORT   prednisoLONE acetate 1 % ophthalmic suspension Commonly known as: PRED FORTE   warfarin 5 MG tablet Commonly known as: COUMADIN       TAKE these medications    acetaminophen 325 MG tablet Commonly known as: TYLENOL Take 650 mg by mouth every 4 (four) hours as needed for mild pain or moderate pain.   albuterol 108 (90 Base) MCG/ACT inhaler Commonly known as: Proventil HFA Inhale 2 puffs into the lungs every 6 (six) hours as needed for wheezing or shortness of breath.   albuterol (2.5 MG/3ML) 0.083% nebulizer solution Commonly known as: PROVENTIL Take 3 mLs (2.5 mg total) by nebulization every 6 (six) hours as needed for wheezing or shortness of  breath.   amLODipine 2.5 MG tablet Commonly known as: NORVASC Take 1 tablet (2.5 mg total) by mouth daily.   brimonidine 0.2 % ophthalmic solution Commonly known as: ALPHAGAN Place 1 drop into the right eye 2 (two) times daily.   cefadroxil  500 MG capsule Commonly known as: DURICEF Take 1 capsule (500 mg total) by mouth 2 (two) times daily.   cyanocobalamin 1000 MCG tablet Commonly known as: VITAMIN B12 Take 1 tablet (1,000 mcg total) by mouth daily.   dorzolamide-timolol 2-0.5 % ophthalmic solution Commonly known as: COSOPT Place 1 drop into the right eye 2 (two) times daily.   fluticasone-salmeterol 250-50 MCG/ACT Aepb Commonly known as: Wixela Inhub Inhale 1 puff into the lungs in the morning and at bedtime.   latanoprost 0.005 % ophthalmic solution Commonly known as: XALATAN Place 1 drop into both eyes at bedtime.   levothyroxine 125 MCG tablet Commonly known as: SYNTHROID Take 1 tablet (125 mcg total) by mouth daily. What changed: when to take this   lisinopril 40 MG tablet Commonly known as: ZESTRIL TAKE 1 TABLET BY MOUTH DAILY   metoprolol tartrate 25 MG tablet Commonly known as: LOPRESSOR Take 1 tablet by mouth twice daily   ondansetron 8 MG disintegrating tablet Commonly known as: ZOFRAN-ODT Take 1 tablet (8 mg total) by mouth every 8 (eight) hours as needed for nausea or vomiting. What changed: reasons to take this   pantoprazole 40 MG tablet Commonly known as: PROTONIX Take 1 tablet (40 mg total) by mouth 2 (two) times daily before a meal. What changed: when to take this   polyethylene glycol 17 g packet Commonly known as: MIRALAX / GLYCOLAX Take 17 g by mouth daily. What changed:  when to take this reasons to take this   pravastatin 10 MG tablet Commonly known as: PRAVACHOL TAKE 1 TABLET BY MOUTH DAILY What changed: when to take this   senna-docusate 8.6-50 MG tablet Commonly known as: Senokot-S Take 2 tablets by mouth 2 (two) times  daily. What changed:  when to take this reasons to take this   Vitamin D-3 25 MCG (1000 UT) Caps Take 2,000 Units by mouth daily.          Time coordinating discharge: 45 min  Signed:  Joseph Art DO  [facility] 12/21/2022, 12:54 PM

## 2022-12-21 NOTE — Plan of Care (Signed)
Pt is progressing 

## 2022-12-22 ENCOUNTER — Telehealth: Payer: Self-pay

## 2022-12-22 NOTE — Transitions of Care (Post Inpatient/ED Visit) (Signed)
12/22/2022  Name: Kari Coleman MRN: [id number] DOB: [birth year]  Today's TOC FU Call Status: Today's TOC FU Call Status:: Unsuccessful Call (1st Attempt) Unsuccessful Call (1st Attempt) Date: 12/22/22  Attempted to reach the patient regarding the most recent Inpatient/ED visit.  Follow Up Plan: Additional outreach attempts will be made to reach the patient to complete the Transitions of Care (Post Inpatient/ED visit) call.     Antionette Fairy, RN,BSN,CCM [facility] Care Management Care Management Community Coordinator Direct Phone: [phone] Toll Free: [phone] Fax: [phone]

## 2022-12-23 LAB — CULTURE, BLOOD (ROUTINE X 2)
Culture: NO GROWTH
Culture: NO GROWTH
Special Requests: ADEQUATE
Special Requests: ADEQUATE

## 2022-12-25 ENCOUNTER — Telehealth: Payer: Self-pay

## 2022-12-25 NOTE — Transitions of Care (Post Inpatient/ED Visit) (Addendum)
12/25/2022  Name: Kari Coleman MRN: [id number] DOB: 07-Sep-1950  Today's TOC FU Call Status: Today's TOC FU Call Status:: Successful TOC FU Call Completed TOC FU Call Complete Date: 12/25/22 Patient's Name and Date of Birth confirmed.  Transition Care Management Follow-up Telephone Call Date of Discharge: 12/21/22 Discharge Facility: [facility]) Type of Discharge: Inpatient Admission Primary Inpatient Discharge Diagnosis:: "acute cystitis without hematuria" How have you been since you were released from the hospital?: Better (Pt reports she feels better overall-having some right heel pain-will discuss with MD-denies any bruising or changes in heel appearance. Appetite good. LBM yest. urine clear-yellow-unsure if she has finished abxs-dtr fills pill box) Any questions or concerns?: No  Items Reviewed: Did you receive and understand the discharge instructions provided?: Yes Medications obtained,verified, and reconciled?: Partial Review Completed Reason for Partial Mediation Review: pt states her daughter manages her meds-filles med planner weekly Any new allergies since your discharge?: No Dietary orders reviewed?: Yes Type of Diet Ordered:: low salt/heart healthy Do you have support at home?: Yes People in Home: child(ren), adult, grandchild(ren) Name of Support/Comfort Primary Source: daughter and grandson stays with pt  Medications Reviewed Today: Medications Reviewed Today     Reviewed by Charlyn Minerva, RN (Registered Nurse) on 12/25/22 at 1002  Med List Status: <None>   Medication Order Taking? Sig Documenting Provider Last Dose Status Informant  acetaminophen (TYLENOL) 325 MG tablet [id number]  Take 650 mg by mouth every 4 (four) hours as needed for mild pain or moderate pain. [provider]  Active Family Member  albuterol (PROVENTIL HFA) 108 (90 Base) MCG/ACT inhaler [id number] Yes Inhale 2 puffs into the lungs every 6 (six) hours as needed for  wheezing or shortness of breath. [name], [name] Taking Active Family Member  albuterol (PROVENTIL) (2.5 MG/3ML) 0.083% nebulizer solution [id number] Yes Take 3 mLs (2.5 mg total) by nebulization every 6 (six) hours as needed for wheezing or shortness of breath. [name], [name] Taking Active Family Member  amLODipine (NORVASC) 2.5 MG tablet [id number]  Take 1 tablet (2.5 mg total) by mouth daily. [name], [name]  Active Family Member  brimonidine [facility]) 0.2 % ophthalmic solution [id number]  Place 1 drop into the right eye 2 (two) times daily. [provider]  Active Family Member           Med Note Antony Madura, CATTIE ELLARD   Tue Dec 19, 2022  8:15 PM) "Right eye only," per family  cefadroxil (DURICEF) 500 MG capsule [id number] Yes Take 1 capsule (500 mg total) by mouth 2 (two) times daily. Joseph Art, DO Taking Active   Cholecalciferol (VITAMIN D-3) 25 MCG (1000 UT) CAPS [id number]  Take 2,000 Units by mouth daily. [provider]  Active Family Member  cyanocobalamin (VITAMIN B12) 1000 MCG tablet [id number] Yes Take 1 tablet (1,000 mcg total) by mouth daily. Joseph Art, DO Taking Active   dorzolamide-timolol (COSOPT) 22.3-6.8 MG/ML ophthalmic solution [id number]  Place 1 drop into the right eye 2 (two) times daily. [provider]  Active   fluticasone-salmeterol [facility] INHUB) 250-50 MCG/ACT AEPB [id number]  Inhale 1 puff into the lungs in the morning and at bedtime. Tomma Lightning, MD  Active Family Member  latanoprost (XALATAN) 0.005 % ophthalmic solution [id number]  Place 1 drop into both eyes at bedtime. [provider]  Active Family Member           Med Note Antony Madura, IYANA TURI Dec 19, 2022  8:17 PM) "Both eyes," per family  levothyroxine (SYNTHROID) 125 MCG tablet [id number]  Take 1 tablet (125 mcg total) by mouth daily.  Patient taking differently: Take 125 mcg by mouth daily before breakfast.   Carlus Pavlov, MD  Active Family Member   lisinopril (ZESTRIL) 40 MG tablet [id number]  TAKE 1 TABLET BY MOUTH DAILY [name], [name]  Active   metoprolol tartrate (LOPRESSOR) 25 MG tablet [id number]  Take 1 tablet by mouth twice daily [name], [name]  Active Family Member  ondansetron (ZOFRAN-ODT) 8 MG disintegrating tablet [id number]  Take 1 tablet (8 mg total) by mouth every 8 (eight) hours as needed for nausea or vomiting.  Patient taking differently: Take 8 mg by mouth every 8 (eight) hours as needed for nausea or vomiting (dissolve orally).   Linwood Dibbles, MD  Active Family Member  pantoprazole (PROTONIX) 40 MG tablet [id number]  Take 1 tablet (40 mg total) by mouth 2 (two) times daily before a meal.  Patient taking differently: Take 40 mg by mouth daily before breakfast.   [name], PA  Active Family Member  polyethylene glycol (MIRALAX / GLYCOLAX) 17 g packet [id number]  Take 17 g by mouth daily.  Patient taking differently: Take 17 g by mouth daily as needed for mild constipation (mix as directed).   Osvaldo Shipper, MD  Active Family Member  pravastatin (PRAVACHOL) 10 MG tablet [id number]  TAKE 1 TABLET BY MOUTH DAILY  Patient taking differently: Take 10 mg by mouth at bedtime.   [name], [name]  Active Family Member  senna-docusate (SENOKOT-S) 8.6-50 MG tablet [id number]  Take 2 tablets by mouth 2 (two) times daily.  Patient taking differently: Take 2 tablets by mouth 2 (two) times daily as needed for mild constipation or moderate constipation.   Osvaldo Shipper, MD  Active Family Member            Home Care and Equipment/Supplies: Were Home Health Services Ordered?: NA Any new equipment or medical supplies ordered?: NA  Functional Questionnaire: Do you need assistance with bathing/showering or dressing?: No Do you need assistance with meal preparation?: No Do you need assistance with eating?: No Do you have difficulty maintaining continence: No Do you need assistance with getting out of bed/getting  out of a chair/moving?: No Do you have difficulty managing or taking your medications?: Yes (dtr fills pill box)  Follow up appointments reviewed: PCP Follow-up appointment confirmed?: Yes Date of PCP follow-up appointment?: 12/26/22 Follow-up Provider: Isidor Holts Specialist [facility] Follow-up appointment confirmed?: NA Do you need transportation to your follow-up appointment?: No (pt confirms family takes her to appts) Do you understand care options if your condition(s) worsen?: Yes-patient verbalized understanding  SDOH Interventions Today    Flowsheet Row Most Recent Value  SDOH Interventions   Food Insecurity Interventions Intervention Not Indicated  Transportation Interventions Intervention Not Indicated       Goals Addressed               This Visit's Progress     TOC Care Plan (pt-stated)        Current Barriers:  Knowledge Deficits related to plan of care for management of HTN   RNCM Clinical Goal(s):  Patient will work with the Care Management team over the next 30 days to address Transition of Care Barriers: ongoing disease mgmt education & support of chronic conditions attend all scheduled medical appointments: pt will complete appt as evidenced by attending PCP appt on 12/26/22  Interventions: Evaluation of current treatment plan related to  self management and patient's adherence to plan as established by provider   Transitions of Care: (Status: New goal.) SHORT TERM GOAL TOC Interventions Discussed (?) Durable Medical Equipment (DME) options discussed with patient/caregiver Doctor Visits  - discussed the importance of doctor visits Reviewed Signs and symptoms of infection Pt with complaints of right heel "hurting & difficult to walk at times"new since d/c home-has MD appt tomorrow to follow up on issues Pt will discuss with provider if she can get rollator  Patient Goals/Self-Care Activities: Participate in Transition of Care  Program/Attend TOC scheduled calls Take medications as prescribed   Attend all scheduled provider appointments Call provider office for new concerns or questions  Pt will maintain safety in the home by suing DME check blood pressure 3 times per week   Follow Up Plan:  Telephone follow up appointment with care management team member scheduled for:  next week The patient has been provided with contact information for the care management team and has been advised to call with any health related questions or concerns.         Antionette Fairy, RN,BSN,CCM [facility] Care Management Care Management Community Coordinator Direct Phone: [phone] Toll Free: [phone] Fax: [phone]

## 2022-12-26 ENCOUNTER — Ambulatory Visit (INDEPENDENT_AMBULATORY_CARE_PROVIDER_SITE_OTHER): Payer: Medicare Other | Admitting: Physician Assistant

## 2022-12-26 ENCOUNTER — Encounter: Payer: Self-pay | Admitting: Physician Assistant

## 2022-12-26 ENCOUNTER — Ambulatory Visit (HOSPITAL_COMMUNITY)
Admission: RE | Admit: 2022-12-26 | Discharge: 2022-12-26 | Disposition: A | Discharge status: Home / Self Care | Payer: Medicare Other | Source: Ambulatory Visit | Attending: Cardiovascular Disease | Admitting: Cardiovascular Disease

## 2022-12-26 VITALS — BP 166/90 | HR 64 | Temp 97.8°F | Ht 67.5 in | Wt 208.0 lb

## 2022-12-26 DIAGNOSIS — N390 Urinary tract infection, site not specified: Secondary | ICD-10-CM | POA: Diagnosis not present

## 2022-12-26 DIAGNOSIS — I10 Essential (primary) hypertension: Secondary | ICD-10-CM | POA: Diagnosis not present

## 2022-12-26 DIAGNOSIS — M1990 Unspecified osteoarthritis, unspecified site: Secondary | ICD-10-CM | POA: Diagnosis not present

## 2022-12-26 DIAGNOSIS — M79604 Pain in right leg: Secondary | ICD-10-CM

## 2022-12-26 NOTE — Patient Instructions (Signed)
It was great to see you!  Continue all medications as prescribed -- you should be on all 3 blood pressure medications (lisinopril 40, amlodipine 2.5, metoprolol 25 mg twice daily)  Please get the ultrasound to assess for DVT -- see below  Take care,  [name] PA-C

## 2022-12-26 NOTE — Progress Notes (Signed)
Kari Coleman is a 72 y.o. female here for a follow up of a pre-existing problem.  History of Present Illness:   Chief Complaint  Patient presents with   Hospitalization Follow-up    HPI  UTI w/ Sepsis: On 9/17 she presented to [facility] ED for fever and altered mental status.  Her family provided most of the history. Reports that she was recently treated for an UTI.  Stated she seemed okay on 9/16, but the next day was acting strange when she called her daughters.  Lab results: lactic elevated at 2.5; 2nd lactic 1.6, cmp nl other than glucose elevated at 152, covid/vlu neg, cbc nl. EKG results: Sinus rhythm Probable LVH with secondary repol abnrm Anterior Q waves, possibly due to LVH No significant change since last tracing. CXR results: no acute cardiopulmonary disease.  She improved after treatment of IV fluids and Rocephin.  She presents today to follow up per recommendations.  Reports that she feels much improved.  Has follow-up appointment with urology later this week.  Hypertension: Currently taking lisinopril 40 mg daily, metoprolol tartrate 25 mg twice daily, amlodipine 2.5 mg daily. At home blood pressure readings are:  not checked. Patient denies chest pain, SOB, blurred vision, dizziness, unusual headaches, lower leg swelling. Patient is unable to tell me if she is compliant with medication. Denies excessive caffeine intake, stimulant usage, excessive alcohol intake, or increase in salt consumption.  BP Readings from Last 3 Encounters:  12/26/22 (!) 166/90  12/21/22 133/71  12/14/22 (!) 160/80    Reports that her daughter manages her medications. Denies chest pain or shortness of breath.  Foot Pain (Right): Reports that she woke up with acute right foot pain a day or two after her hospitalization. States that the pain was so bad that she couldn't walk. Pain is improved today, but still affecting gait. Endorses tenderness.  History of pulmonary embolism after  COVID but she has completed her coumadin for this  Osteoarthritis She is requesting a rolling walking and handicap placard today to help manage her symptom(s)   Past Medical History:  Diagnosis Date   Allergy    Arthritis    Cataract    Cervical cancer (HCC)    GERD (gastroesophageal reflux disease)    Glaucoma 2017   History of echocardiogram    Echo 9/16:  Mild LVH, EF 55-60%, no RWMA, Gr 1 DD, mild MR // Echo 1/17:  Mild LVH, EF 50-55%, Gr 1 DD, mild to mod MR, mild LAE   Hypertension    Hyperthyroidism    s/p RAI treatment   Palpitations    PONV (postoperative nausea and vomiting)    years ago  . Woke up and saw asian people    PONV (postoperative nausea and vomiting) 07/18/2021   Retinal detachment    L eye - partial blindness   Tobacco abuse    Tuberculosis    positive test as a caregiver cxr annually    Urinary incontinence      Social History   Tobacco Use   Smoking status: Former    Current packs/day: 0.00    Average packs/day: 0.5 packs/day for 48.0 years (24.0 ttl pk-yrs)    Types: Cigarettes    Start date: 11/30/1968    Quit date: 10/31/2016    Years since quitting: 6.1   Smokeless tobacco: Never   Tobacco comments:    did not smoke more than .5 pack   Vaping Use   Vaping status: Never Used  Substance Use Topics   Alcohol use: Not Currently    Alcohol/week: 0.0 standard drinks of alcohol    Comment: rarely once a year   Drug use: No    Past Surgical History:  Procedure Laterality Date   ABDOMINAL HYSTERECTOMY  1984   bladder surgey for incontinence     EYE SURGERY     REPAIR OF COMPLEX TRACTION RETINAL DETACHMENT     thyroid radiation     TOTAL ABDOMINAL HYSTERECTOMY W/ BILATERAL SALPINGOOPHORECTOMY     TOTAL KNEE ARTHROPLASTY Right 04/22/2019   Procedure: RIGHT TOTAL KNEE ARTHROPLASTY;  Surgeon: Marcene Corning, MD;  Location: [facility];  Service: Orthopedics;  Laterality: Right;  PENDING PRE-OP FOR SDDC APPROVAL    Family History  Problem  Relation Age of Onset   Hypertension Mother    Heart failure Mother    Diabetes Mother    Pancreatic cancer Father    Colon cancer Maternal Aunt    Cancer Sister    Thyroid disease Paternal Aunt    Cancer Paternal Aunt    Thyroid disease Other        paternal side   Cancer Maternal Grandfather    Diabetes Paternal Grandmother    Diabetes Maternal Uncle    Colon polyps Neg Hx    Stomach cancer Neg Hx     Allergies  Allergen Reactions   Doxycycline Nausea Only   Codeine Nausea And Vomiting   Pollen Extract Itching and Other (See Comments)    Congestion, itchy eyes, runny nose    Current Medications:   Current Outpatient Medications:    acetaminophen (TYLENOL) 325 MG tablet, Take 650 mg by mouth every 4 (four) hours as needed for mild pain or moderate pain., Disp: , Rfl:    albuterol (PROVENTIL HFA) 108 (90 Base) MCG/ACT inhaler, Inhale 2 puffs into the lungs every 6 (six) hours as needed for wheezing or shortness of breath., Disp: 18 g, Rfl: 2   albuterol (PROVENTIL) (2.5 MG/3ML) 0.083% nebulizer solution, Take 3 mLs (2.5 mg total) by nebulization every 6 (six) hours as needed for wheezing or shortness of breath., Disp: 150 mL, Rfl: 1   amLODipine (NORVASC) 2.5 MG tablet, Take 1 tablet (2.5 mg total) by mouth daily., Disp: 90 tablet, Rfl: 1   brimonidine (ALPHAGAN) 0.2 % ophthalmic solution, Place 1 drop into the right eye 2 (two) times daily., Disp: , Rfl:    Cholecalciferol (VITAMIN D-3) 25 MCG (1000 UT) CAPS, Take 2,000 Units by mouth daily., Disp: , Rfl:    cyanocobalamin (VITAMIN B12) 1000 MCG tablet, Take 1 tablet (1,000 mcg total) by mouth daily., Disp: 30 tablet, Rfl: 2   dorzolamide-timolol (COSOPT) 22.3-6.8 MG/ML ophthalmic solution, Place 1 drop into the right eye 2 (two) times daily., Disp: , Rfl:    fluticasone-salmeterol (WIXELA INHUB) 250-50 MCG/ACT AEPB, Inhale 1 puff into the lungs in the morning and at bedtime., Disp: 60 each, Rfl: 5   latanoprost (XALATAN) 0.005  % ophthalmic solution, Place 1 drop into both eyes at bedtime., Disp: , Rfl:    levothyroxine (SYNTHROID) 125 MCG tablet, Take 1 tablet (125 mcg total) by mouth daily. (Patient taking differently: Take 125 mcg by mouth daily before breakfast.), Disp: 45 tablet, Rfl: 3   lisinopril (ZESTRIL) 40 MG tablet, TAKE 1 TABLET BY MOUTH DAILY, Disp: 100 tablet, Rfl: 0   metoprolol tartrate (LOPRESSOR) 25 MG tablet, Take 1 tablet by mouth twice daily, Disp: 180 tablet, Rfl: 1   ondansetron (ZOFRAN-ODT) 8 MG disintegrating  tablet, Take 1 tablet (8 mg total) by mouth every 8 (eight) hours as needed for nausea or vomiting. (Patient taking differently: Take 8 mg by mouth every 8 (eight) hours as needed for nausea or vomiting (dissolve orally).), Disp: 12 tablet, Rfl: 0   pantoprazole (PROTONIX) 40 MG tablet, Take 1 tablet (40 mg total) by mouth 2 (two) times daily before a meal. (Patient taking differently: Take 40 mg by mouth daily before breakfast.), Disp: 180 tablet, Rfl: 0   polyethylene glycol (MIRALAX / GLYCOLAX) 17 g packet, Take 17 g by mouth daily. (Patient taking differently: Take 17 g by mouth daily as needed for mild constipation (mix as directed).), Disp: 14 each, Rfl: 0   pravastatin (PRAVACHOL) 10 MG tablet, TAKE 1 TABLET BY MOUTH DAILY (Patient taking differently: Take 10 mg by mouth at bedtime.), Disp: 100 tablet, Rfl: 0   senna-docusate (SENOKOT-S) 8.6-50 MG tablet, Take 2 tablets by mouth 2 (two) times daily. (Patient taking differently: Take 2 tablets by mouth 2 (two) times daily as needed for mild constipation or moderate constipation.), Disp: 60 tablet, Rfl: 0   Review of Systems:   ROS See pertinent positives and negatives as per the HPI.  Vitals:   Vitals:   12/26/22 1114 12/26/22 1140  BP: (!) 160/90 (!) 166/90  Pulse: 64   Temp: 97.8 F (36.6 C)   TempSrc: Temporal   SpO2: 96%   Weight: 208 lb (94.3 kg)   Height: 5' 7.5" (1.715 m)      Body mass index is 32.1 kg/m.  Physical  Exam:   Physical Exam Vitals and nursing note reviewed.  Constitutional:      General: She is not in acute distress.    Appearance: She is well-developed. She is not ill-appearing or toxic-appearing.  Cardiovascular:     Rate and Rhythm: Normal rate and regular rhythm.     Pulses: Normal pulses.          Dorsalis pedis pulses are 2+ on the right side and 2+ on the left side.       Posterior tibial pulses are 2+ on the right side and 2+ on the left side.     Heart sounds: Normal heart sounds, S1 normal and S2 normal.     Comments: Adequate capillary refill in toes Pulmonary:     Effort: Pulmonary effort is normal.     Breath sounds: Normal breath sounds.  Musculoskeletal:     Comments: No tenderness to palpation to b/l legs Mild swelling of R foot compared to left Normal range of motion   No calf tenderness to palpation   Skin:    General: Skin is warm and dry.  Neurological:     Mental Status: She is alert.     GCS: GCS eye subscore is 4. GCS verbal subscore is 5. GCS motor subscore is 6.  Psychiatric:        Speech: Speech normal.        Behavior: Behavior normal. Behavior is cooperative.     Assessment and Plan:   Right leg pain Due to swelling in R foot, recent pain, history of blood clot and recent hospitalization, will order ultrasound to rule out deep vein thrombosis Next steps will be advised based on clinical symptom(s) and results  Osteoarthritis, unspecified osteoarthritis type, unspecified site Will provide prescription for rolling walker and handicap placard today Denies any additional needs  Recurrent UTI Symptom(s) resolved Recommend close follow-up with urology to review recent hospitalization and to  discuss any preventative strategies  HTN Above goal today No evidence of end-organ damage on my exam Recommend patient monitor home blood pressure at least a few times weekly Continue lisinopril 40 mg daily, metoprolol tartrate 25 mg twice daily,  amlodipine 2.5 mg daily mg daily If home monitoring shows consistent elevation, or any symptom(s) develop, recommend reach out to [place] for further advice on next steps   I,Emily Lagle,acting as a scribe for [employer], PA.,have documented all relevant documentation on the behalf of [name], PA,as directed by  [name], PA while in the presence of [name], [name].  I, [name], [name], have reviewed all documentation for this visit. The documentation on 12/26/22 for the exam, diagnosis, procedures, and orders are all accurate and complete.  [name], PA-C

## 2022-12-28 ENCOUNTER — Other Ambulatory Visit: Payer: Self-pay | Admitting: Physician Assistant

## 2023-01-01 ENCOUNTER — Telehealth: Payer: Self-pay | Admitting: Physician Assistant

## 2023-01-01 MED ORDER — AMLODIPINE BESYLATE 2.5 MG PO TABS: 2.5000 mg | ORAL_TABLET | Freq: Every day | ORAL | 1 refills | Status: DC

## 2023-01-01 NOTE — Telephone Encounter (Signed)
Prescription Request  01/01/2023  LOV: 12/26/2022  What is the name of the medication or equipment? amLODipine (NORVASC) 2.5 MG tablet   Have you contacted your pharmacy to request a refill? Yes   Which pharmacy would you like this sent to?  [facility] [address] [place], [place] - [address] [place] [address] Phone: [phone] Fax: [phone]     Patient notified that their request is being sent to the clinical staff for review and that they should receive a response within 2 business days.   Please advise at Mobile [phone] (mobile)

## 2023-01-01 NOTE — Telephone Encounter (Signed)
Spoke to pt told her Rx for Amlodipine was sent to pharmacy as requested. Pt verbalized understanding.

## 2023-01-02 ENCOUNTER — Ambulatory Visit: Payer: Self-pay

## 2023-01-02 NOTE — Patient Outreach (Signed)
Care Management  Transitions of Care Program Transitions of Care Post-discharge week 2   01/02/2023 Name: Kari Coleman MRN: [id number] DOB: 22-Jul-1950  Subjective: Kari Coleman is a 72 y.o. year old female who is a primary care patient of [name], [name]. The Care Management team Engaged with patient Engaged with patient by telephone to assess and address transitions of care needs.   Consent to Services:  Patient was given information about care management services, agreed to services, and gave verbal consent to participate.   Assessment:   Patient voices she is "doing and feeling a little better overall." Pain to right foot/heel is improved. She voices that she is urinating and urine is clear,yellow and no longer has odor. She had to cancel urology appt due to bad weather last week but plans to reschedule. She did complete PCP appt on 12/26/22. BP as a little elevated. She has been advised to monitor BP in home and has f/u PCP appt on 01/09/23 pt denies any RN CM needs or concerns at this time.         SDOH Interventions    Flowsheet Row Telephone from 12/25/2022 in [facility] [employer] [facility] Care Coordination Clinical Support from 12/12/2021 in [facility]  SDOH Interventions    Food Insecurity Interventions Intervention Not Indicated Intervention Not Indicated  Housing Interventions -- Intervention Not Indicated  Transportation Interventions Intervention Not Indicated Intervention Not Indicated  Utilities Interventions -- Intervention Not Indicated  Alcohol Usage Interventions -- Intervention Not Indicated (Score <7)  Financial Strain Interventions -- Intervention Not Indicated  Physical Activity Interventions -- Other (Comments)  [pt is requesting PT]  Social Connections Interventions -- Intervention Not Indicated        Goals Addressed               This Visit's Progress     TOC Care Plan (pt-stated)        Current Barriers:   Knowledge Deficits related to plan of care for management of HTN   RNCM Clinical Goal(s):  Patient will work with the Care Management team over the next 30 days to address Transition of Care Barriers: ongoing disease mgmt education & support of chronic conditions attend all scheduled medical appointments: pt will complete appt as evidenced by attending PCP appt on 12/26/22          Interventions: Evaluation of current treatment plan related to  self management and patient's adherence to plan as established by provider   Transitions of Care: (Status: Goal on Track (progressing): YES.) SHORT TERM GOAL TOC Interventions Discussed (?) Durable Medical Equipment (DME) options discussed with patient/caregiver Doctor Visits  - discussed the importance of doctor visits Reviewed Signs and symptoms of infection Pt with complaints of right heel -states  sxs have improved-evaluated by PCP-had DVT testing/dopplers which were negative Pt got script for rollator during PCP appt-daughter is going to get DME from local store  Patient Goals/Self-Care Activities: Participate in Transition of Care Program/Attend [facility] scheduled calls Take medications as prescribed   Attend all scheduled provider appointments Call provider office for new concerns or questions  Pt will maintain safety in the home by using DME check blood pressure 3 times per week-pt admits that she has not been checking BP-will try to stat doing it at least 1x/day-record and take BP log to PCP appt on 01/09/23   Follow Up Plan:  Telephone follow up appointment with care management team member scheduled for:  next week The patient  has been provided with contact information for the care management team and has been advised to call with any health related questions or concerns.          Plan: Telephone follow up appointment with care management team member scheduled for: The patient has been provided with contact information for the care  management team and has been advised to call with any health related questions or concerns.   Antionette Fairy, RN,BSN,CCM Care Management Community Coordinator  Transitions of Care  [facility]  Direct Phone: [phone] Toll Free: [phone] Fax: [phone]

## 2023-01-02 NOTE — Patient Instructions (Signed)
Visit Information  Thank you for taking time to visit with me today. Please don't hesitate to contact me if I can be of assistance to you before our next scheduled telephone appointment.  Our next appointment is by telephone on 01/10/23 at 1pm  Following is a copy of your care plan:   Goals Addressed               This Visit's Progress     TOC Care Plan (pt-stated)        Current Barriers:  Knowledge Deficits related to plan of care for management of HTN   RNCM Clinical Goal(s):  Patient will work with the Care Management team over the next 30 days to address Transition of Care Barriers: ongoing disease mgmt education & support of chronic conditions attend all scheduled medical appointments: pt will complete appt as evidenced by attending PCP appt on 12/26/22          Interventions: Evaluation of current treatment plan related to  self management and patient's adherence to plan as established by provider   Transitions of Care: (Status: Goal on Track (progressing): YES.) SHORT TERM GOAL TOC Interventions Discussed (?) Durable Medical Equipment (DME) options discussed with patient/caregiver Doctor Visits  - discussed the importance of doctor visits Reviewed Signs and symptoms of infection Pt with complaints of right heel -states  sxs have improved-evaluated by PCP-had DVT testing/dopplers which were negative Pt got script for rollator during PCP appt-daughter is going to get DME from local store  Patient Goals/Self-Care Activities: Participate in Transition of Care Program/Attend [facility] scheduled calls Take medications as prescribed   Attend all scheduled provider appointments Call provider office for new concerns or questions  Pt will maintain safety in the home by using DME check blood pressure 3 times per week-pt admits that she has not been checking BP-will try to stat doing it at least 1x/day-record and take BP log to PCP appt on 01/09/23   Follow Up Plan:  Telephone follow  up appointment with care management team member scheduled for:  next week The patient has been provided with contact information for the care management team and has been advised to call with any health related questions or concerns.          The patient verbalized understanding of instructions, educational materials, and care plan provided today and DECLINED offer to receive copy of patient instructions, educational materials, and care plan.   Telephone follow up appointment with care management team member scheduled for:  Please call the care guide team at [phone] if you need to cancel or reschedule your appointment.   Please call the [place] National Suicide Prevention Lifeline: 929-428-0772 or TTY: 306-330-6331 TTY 970-857-2484) to talk to a trained counselor call 1-800-273-TALK (toll free, 24 hour hotline) if you are experiencing a Mental Health or Behavioral Health Crisis or need someone to talk to.  Antionette Fairy, RN,BSN,CCM Care Management Community Coordinator  Transitions of Care  [facility]  Direct Phone: [phone] Toll Free: [phone] Fax: [phone]

## 2023-01-06 ENCOUNTER — Other Ambulatory Visit: Payer: Self-pay | Admitting: Physician Assistant

## 2023-01-09 ENCOUNTER — Ambulatory Visit (INDEPENDENT_AMBULATORY_CARE_PROVIDER_SITE_OTHER): Payer: Medicare Other | Admitting: Physician Assistant

## 2023-01-09 ENCOUNTER — Encounter: Payer: Self-pay | Admitting: Physician Assistant

## 2023-01-09 VITALS — BP 160/90 | HR 68 | Temp 97.8°F | Ht 67.5 in | Wt 208.0 lb

## 2023-01-09 DIAGNOSIS — I10 Essential (primary) hypertension: Secondary | ICD-10-CM | POA: Diagnosis not present

## 2023-01-09 DIAGNOSIS — E538 Deficiency of other specified B group vitamins: Secondary | ICD-10-CM | POA: Diagnosis not present

## 2023-01-09 NOTE — Patient Instructions (Signed)
It was great to see you!  Your blood pressure is improving Let's continue current regimen  Let's follow-up in 3 months  Please go to the [facility] and see if you can make some friends/connections!  Take care,  [name] PA-C

## 2023-01-09 NOTE — Progress Notes (Signed)
Kari Coleman is a 72 y.o. female here for a follow-up of a pre-existing problem.  History of Present Illness:   Chief Complaint  Patient presents with   Hypertension    Blood pressure today 132/80    HPI - ambulating with rolator.  Hypertension: Managed/Compliant with 40 mg Lisinopril daily, 25 mg Metoprolol tartrate BID, and 2.5 mg Amlodipine.   Reports she is monitoring at home, with most recent reading at home being 132/80.  Denies excessive caffeine intake, stimulant usage, excessive alcohol intake, or increase in salt consumption.  Reports since getting her rolator she has been able to exercise more.  Notes that she followed up with cardiology for her RLE pain and all tests done resulted normal. Her pain has decreased since last visit.  Denies chest pain, shortness of breath, blurred vision, dizziness, unusual headaches, or lower leg swelling.  BP Readings from Last 3 Encounters:  01/09/23 (!) 160/90  12/26/22 (!) 166/90  12/21/22 133/71   B-12 Deficiency: Reports she is taking her B-12 supplements.    Past Medical History:  Diagnosis Date   Allergy    Arthritis    Cataract    Cervical cancer (HCC)    GERD (gastroesophageal reflux disease)    Glaucoma 2017   History of echocardiogram    Echo 9/16:  Mild LVH, EF 55-60%, no RWMA, Gr 1 DD, mild MR // Echo 1/17:  Mild LVH, EF 50-55%, Gr 1 DD, mild to mod MR, mild LAE   Hypertension    Hyperthyroidism    s/p RAI treatment   Palpitations    PONV (postoperative nausea and vomiting)    years ago  . Woke up and saw asian people    PONV (postoperative nausea and vomiting) 07/18/2021   Retinal detachment    L eye - partial blindness   Tobacco abuse    Tuberculosis    positive test as a caregiver cxr annually    Urinary incontinence     Social History   Tobacco Use   Smoking status: Former    Current packs/day: 0.00    Average packs/day: 0.5 packs/day for 48.0 years (24.0 ttl pk-yrs)    Types: Cigarettes    Start  date: 11/30/1968    Quit date: 10/31/2016    Years since quitting: 6.1   Smokeless tobacco: Never   Tobacco comments:    did not smoke more than .5 pack   Vaping Use   Vaping status: Never Used  Substance Use Topics   Alcohol use: Not Currently    Alcohol/week: 0.0 standard drinks of alcohol    Comment: rarely once a year   Drug use: No   Past Surgical History:  Procedure Laterality Date   ABDOMINAL HYSTERECTOMY  1984   bladder surgey for incontinence     EYE SURGERY     REPAIR OF COMPLEX TRACTION RETINAL DETACHMENT     thyroid radiation     TOTAL ABDOMINAL HYSTERECTOMY W/ BILATERAL SALPINGOOPHORECTOMY     TOTAL KNEE ARTHROPLASTY Right 04/22/2019   Procedure: RIGHT TOTAL KNEE ARTHROPLASTY;  Surgeon: Marcene Corning, MD;  Location: [facility];  Service: Orthopedics;  Laterality: Right;  PENDING PRE-OP FOR SDDC APPROVAL   Family History  Problem Relation Age of Onset   Hypertension Mother    Heart failure Mother    Diabetes Mother    Pancreatic cancer Father    Colon cancer Maternal Aunt    Cancer Sister    Thyroid disease Paternal Aunt    Cancer  Paternal Aunt    Thyroid disease Other        paternal side   Cancer Maternal Grandfather    Diabetes Paternal Grandmother    Diabetes Maternal Uncle    Colon polyps Neg Hx    Stomach cancer Neg Hx    Allergies  Allergen Reactions   Doxycycline Nausea Only   Codeine Nausea And Vomiting   Pollen Extract Itching and Other (See Comments)    Congestion, itchy eyes, runny nose   Current Medications:   Current Outpatient Medications:    acetaminophen (TYLENOL) 325 MG tablet, Take 650 mg by mouth every 4 (four) hours as needed for mild pain or moderate pain., Disp: , Rfl:    albuterol (PROVENTIL HFA) 108 (90 Base) MCG/ACT inhaler, Inhale 2 puffs into the lungs every 6 (six) hours as needed for wheezing or shortness of breath., Disp: 18 g, Rfl: 2   albuterol (PROVENTIL) (2.5 MG/3ML) 0.083% nebulizer solution, Take 3 mLs (2.5 mg total)  by nebulization every 6 (six) hours as needed for wheezing or shortness of breath., Disp: 150 mL, Rfl: 1   amLODipine (NORVASC) 2.5 MG tablet, Take 1 tablet (2.5 mg total) by mouth daily., Disp: 90 tablet, Rfl: 1   brimonidine (ALPHAGAN) 0.2 % ophthalmic solution, Place 1 drop into the right eye 2 (two) times daily., Disp: , Rfl:    Cholecalciferol (VITAMIN D-3) 25 MCG (1000 UT) CAPS, Take 2,000 Units by mouth daily., Disp: , Rfl:    cyanocobalamin (VITAMIN B12) 1000 MCG tablet, Take 1 tablet (1,000 mcg total) by mouth daily., Disp: 30 tablet, Rfl: 2   dorzolamide-timolol (COSOPT) 22.3-6.8 MG/ML ophthalmic solution, Place 1 drop into the right eye 2 (two) times daily., Disp: , Rfl:    fluticasone-salmeterol (WIXELA INHUB) 250-50 MCG/ACT AEPB, Inhale 1 puff into the lungs in the morning and at bedtime., Disp: 60 each, Rfl: 5   latanoprost (XALATAN) 0.005 % ophthalmic solution, Place 1 drop into both eyes at bedtime., Disp: , Rfl:    levothyroxine (SYNTHROID) 125 MCG tablet, Take 1 tablet (125 mcg total) by mouth daily. (Patient taking differently: Take 125 mcg by mouth daily before breakfast.), Disp: 45 tablet, Rfl: 3   lisinopril (ZESTRIL) 40 MG tablet, TAKE 1 TABLET BY MOUTH DAILY, Disp: 100 tablet, Rfl: 2   metoprolol tartrate (LOPRESSOR) 25 MG tablet, Take 1 tablet by mouth twice daily, Disp: 180 tablet, Rfl: 1   ondansetron (ZOFRAN-ODT) 8 MG disintegrating tablet, Take 1 tablet (8 mg total) by mouth every 8 (eight) hours as needed for nausea or vomiting. (Patient taking differently: Take 8 mg by mouth every 8 (eight) hours as needed for nausea or vomiting (dissolve orally).), Disp: 12 tablet, Rfl: 0   pantoprazole (PROTONIX) 40 MG tablet, TAKE 1 TABLET BY MOUTH TWICE DAILY BEFORE A MEAL, Disp: 180 tablet, Rfl: 0   polyethylene glycol (MIRALAX / GLYCOLAX) 17 g packet, Take 17 g by mouth daily. (Patient taking differently: Take 17 g by mouth daily as needed for mild constipation (mix as directed).),  Disp: 14 each, Rfl: 0   pravastatin (PRAVACHOL) 10 MG tablet, TAKE 1 TABLET BY MOUTH DAILY, Disp: 100 tablet, Rfl: 2   senna-docusate (SENOKOT-S) 8.6-50 MG tablet, Take 2 tablets by mouth 2 (two) times daily. (Patient taking differently: Take 2 tablets by mouth 2 (two) times daily as needed for mild constipation or moderate constipation.), Disp: 60 tablet, Rfl: 0  Review of Systems:   ROS See pertinent positives and negatives as per the HPI.  Vitals:   Vitals:   01/09/23 1517 01/09/23 1542  BP: (!) 140/90 (!) 160/90  Pulse: 68   Temp: 97.8 F (36.6 C)   TempSrc: Temporal   SpO2: 95%   Weight: 208 lb (94.3 kg)   Height: 5' 7.5" (1.715 m)      Body mass index is 32.1 kg/m.  Physical Exam:   Physical Exam Vitals and nursing note reviewed.  Constitutional:      General: She is not in acute distress.    Appearance: She is well-developed. She is not ill-appearing or toxic-appearing.  Cardiovascular:     Rate and Rhythm: Normal rate and regular rhythm.     Pulses: Normal pulses.     Heart sounds: Normal heart sounds, S1 normal and S2 normal.  Pulmonary:     Effort: Pulmonary effort is normal.     Breath sounds: Normal breath sounds.  Skin:    General: Skin is warm and dry.  Neurological:     Mental Status: She is alert.     GCS: GCS eye subscore is 4. GCS verbal subscore is 5. GCS motor subscore is 6.  Psychiatric:        Speech: Speech normal.        Behavior: Behavior normal. Behavior is cooperative.     Assessment and Plan:   Essential hypertension Above goal today No evidence of end-organ damage on my exam Recommend patient monitor home blood pressure at least a few times weekly BP at home are more in-range, I do have concerns of reducing her blood pressure too much due and causing increase in falls -- recommend keep close monitoring at home Continue 40 mg Lisinopril daily, 25 mg Metoprolol tartrate BID, and 2.5 mg Amlodipine.   If home monitoring shows consistent  elevation, or any symptom(s) develop, recommend reach out to [place] for further advice on next steps   B12 deficiency Continue B12 supplementation Will recheck at follow-up  I,Emily Lagle,acting as a scribe for [employer], PA.,have documented all relevant documentation on the behalf of [name], PA,as directed by  [name], PA while in the presence of [name], [name].  I, [name], [name], have reviewed all documentation for this visit. The documentation on 01/09/23 for the exam, diagnosis, procedures, and orders are all accurate and complete.  [name], PA-C

## 2023-01-10 ENCOUNTER — Ambulatory Visit: Payer: Self-pay

## 2023-01-10 NOTE — Patient Outreach (Signed)
Care Management  Transitions of Care Program Transitions of Care Post-discharge week 3  01/10/2023 Name: Kari Coleman MRN: [id number] DOB: [birth year]  Subjective: Kari Coleman is a 72 y.o. year old female who is a primary care patient of [name], [name]. The Care Management team was unable to reach the patient by phone to assess and address transitions of care needs.   Plan: Additional outreach attempts will be made to reach the patient enrolled in the [facility] Program (Post Inpatient/ED Visit).    Antionette Fairy, RN,BSN,CCM RN Care Manager Transitions of Care  [facility]/Population Health  Direct Phone: [phone] Toll Free: [phone] Fax: [phone]

## 2023-01-11 ENCOUNTER — Telehealth: Payer: Self-pay

## 2023-01-11 NOTE — Patient Outreach (Signed)
Care Management  Transitions of Care Program Transitions of Care Post-discharge week 3   01/11/2023 Name: Kari Coleman MRN: [id number] DOB: Aug 15, 1950  Subjective: Kari Coleman is a 72 y.o. year old female who is a primary care patient of [name], [name]. The Care Management team Engaged with patient Engaged with patient by telephone to assess and address transitions of care needs.   Consent to Services:  Patient was given information about care management services, agreed to services, and gave verbal consent to participate.   Assessment: Patient voices she is doing well. She has had a good week. She completed PCP appt-no changes made. She is excited about her new walker and is loving it. She plans to start walking outside some with it. She is looking forward to her upcoming family reunion this weekend. She continues to check BP daily-has not done so today yet-just waking but will do so. Denies any RN CM needs or concerns.          SDOH Interventions    Flowsheet Row Telephone from 12/25/2022 in [facility] [employer] Care Coordination Clinical Support from 12/12/2021 in [facility]  SDOH Interventions    Food Insecurity Interventions Intervention Not Indicated Intervention Not Indicated  Housing Interventions -- Intervention Not Indicated  Transportation Interventions Intervention Not Indicated Intervention Not Indicated  Utilities Interventions -- Intervention Not Indicated  Alcohol Usage Interventions -- Intervention Not Indicated (Score <7)  Financial Strain Interventions -- Intervention Not Indicated  Physical Activity Interventions -- Other (Comments)  [pt is requesting PT]  Social Connections Interventions -- Intervention Not Indicated        Goals Addressed               This Visit's Progress     TOC Care Plan (pt-stated)        Current Barriers:  Knowledge Deficits related to plan of care for management of HTN   RNCM  Clinical Goal(s):  Patient will work with the Care Management team over the next 30 days to address Transition of Care Barriers: ongoing disease mgmt education & support of chronic conditions attend all scheduled medical appointments: pt will complete appt as evidenced by attending PCP appt on 12/26/22          Interventions: Evaluation of current treatment plan related to  self management and patient's adherence to plan as established by provider   Transitions of Care: (Status: Goal on Track (progressing): YES.) SHORT TERM GOAL Assessed fall/safety in the home-pt has new walker and it is using it-plans to start to walking around outside the home Assessed genitourinary status- pt reports no further UTI sxs Assessed right heel/foot-pt pleased to report pain has gone away-no further sxs   Patient Goals/Self-Care Activities: Participate in Transition of Care Program/Attend TOC scheduled calls Take medications as prescribed   Attend all scheduled provider appointments Attend church or other social activities Perform all self care activities independently  Call provider office for new concerns or questions  Pt will maintain safety in the home by using DME check blood pressure 3 times per week write blood pressure results in a log or diary-pt just getting up-has not checked BP today-states it continues to fluctuate   Follow Up Plan:  Telephone follow up appointment with care management team member scheduled for:  next week The patient has been provided with contact information for the care management team and has been advised to call with any health related questions or concerns.  Plan: The patient has been provided with contact information for the care management team and has been advised to call with any health related questions or concerns.  Follow up appt scheduled with patient    Antionette Fairy, RN,BSN,CCM RN Care Manager Transitions of Care  [facility]Population [facility]  Direct Phone: [phone] Toll Free: [phone] Fax: [phone]

## 2023-01-11 NOTE — Patient Instructions (Signed)
Visit Information  Thank you for taking time to visit with me today. Please don't hesitate to contact me if I can be of assistance to you before our next scheduled telephone appointment.  Our next appointment is by telephone on 01/19/23 at 11am  Following is a copy of your care plan:   Goals Addressed               This Visit's Progress     TOC Care Plan (pt-stated)        Current Barriers:  Knowledge Deficits related to plan of care for management of HTN   RNCM Clinical Goal(s):  Patient will work with the Care Management team over the next 30 days to address Transition of Care Barriers: ongoing disease mgmt education & support of chronic conditions attend all scheduled medical appointments: pt will complete appt as evidenced by attending PCP appt on 12/26/22          Interventions: Evaluation of current treatment plan related to  self management and patient's adherence to plan as established by provider   Transitions of Care: (Status: Goal on Track (progressing): YES.) SHORT TERM GOAL Assessed fall/safety in the home-pt has new walker and it is using it-plans to start to walking around outside the home Assessed genitourinary status- pt reports no further UTI sxs Assessed right heel/foot-pt pleased to report pain has gone away-no further sxs   Patient Goals/Self-Care Activities: Participate in Transition of Care Program/Attend TOC scheduled calls Take medications as prescribed   Attend all scheduled provider appointments Attend church or other social activities Perform all self care activities independently  Call provider office for new concerns or questions  Pt will maintain safety in the home by using DME check blood pressure 3 times per week write blood pressure results in a log or diary-pt just getting up-has not checked BP today-states it continues to fluctuate   Follow Up Plan:  Telephone follow up appointment with care management team member scheduled for:  next  week The patient has been provided with contact information for the care management team and has been advised to call with any health related questions or concerns.          The patient verbalized understanding of instructions, educational materials, and care plan provided today and DECLINED offer to receive copy of patient instructions, educational materials, and care plan.   The patient has been provided with contact information for the care management team and has been advised to call with any health related questions or concerns.   Please call the care guide team at [phone] if you need to cancel or reschedule your appointment.   Please call the Suicide and Crisis Lifeline: 988 call the [place] National Suicide Prevention Lifeline: 601-578-6354 or TTY: (810)117-7938 TTY 671-707-7371) to talk to a trained counselor if you are experiencing a Mental Health or Behavioral Health Crisis or need someone to talk to.  Antionette Fairy, RN,BSN,CCM RN Care Manager Transitions of Care  [place]/Population Health  Direct Phone: [phone] Toll Free: [phone] Fax: [phone]

## 2023-01-18 ENCOUNTER — Ambulatory Visit: Payer: Medicare Other | Admitting: Internal Medicine

## 2023-01-18 ENCOUNTER — Encounter: Payer: Self-pay | Admitting: Internal Medicine

## 2023-01-18 VITALS — BP 150/82 | HR 88 | Ht 67.5 in | Wt 208.6 lb

## 2023-01-18 DIAGNOSIS — E890 Postprocedural hypothyroidism: Secondary | ICD-10-CM | POA: Diagnosis not present

## 2023-01-18 DIAGNOSIS — E042 Nontoxic multinodular goiter: Secondary | ICD-10-CM

## 2023-01-18 NOTE — Progress Notes (Signed)
Patient ID: Kari Coleman, female   DOB: 01-24-1951, 72 y.o.   MRN: [id number]   HPI  Kari Coleman is a 72 y.o.-year-old female, returning for follow-up for post ablative hypothyroidism for left toxic adenoma and multinodular goiter.  She previously saw Dr. Everardo All, last visit 1 month ago.  Interim history: No weight gain, cold intolerance. She was admitted with a UTI 4 days after our last OV.  This is almost resolved.  Reviewed hx: Pt. has been dx with a toxic multinodular goiter in 2013.  On the thyroid uptake and scan, she had a left toxic adenoma.  She had RAI treatment in 07/2018 and was started on levothyroxine subsequently.  Pt. is on levothyroxine 125 mcg daily, decreased at last visit from 175 mcg daily (unclear why she was taking this dose). - in am - NOT always fasting 30 min from b'fast - no calcium - no iron - no multivitamins - + Protonix ~1h after LT4 >> moved this to the evening - not on Biotin On Vitamin D.  I reviewed pt's thyroid tests: Lab Results  Component Value Date   TSH 0.048 (L) 12/19/2022   TSH <0.01 repeated and verified (L) 06/09/2022   TSH 0.99 08/12/2021   TSH 1.30 01/26/2021   TSH 10.12 (H) 08/26/2020   TSH 7.88 (H) 05/27/2020   TSH 5.66 (H) 01/22/2020   TSH 5.13 (H) 10/21/2019   TSH 8.05 (H) 07/11/2019   TSH 2.92 04/14/2019   FREET4 1.51 06/09/2022   FREET4 1.05 01/26/2021   FREET4 0.52 (L) 08/26/2020   FREET4 0.80 05/27/2020   FREET4 0.61 01/22/2020   FREET4 0.75 10/21/2019   FREET4 0.58 (L) 07/11/2019   FREET4 0.86 04/14/2019   FREET4 0.49 (L) 01/21/2019   FREET4 0.61 12/23/2018   T3FREE 5.1 (H) 06/11/2017   Antithyroid antibodies: No results found for: "THGAB" No components found for: "TPOAB"  Thyroid U/S (03/26/2014): There is marked diffuse heterogeneity of thyroid parenchymal echotexture   Right thyroid lobe.: Normal in size measuring 4.7 x 2.4 x 2.5 cm.   Right, mid - 2.5 x 1.5 x 1.8 cm - mixed echogenic, partially  cystic partially solid, slightly ill-defined, possibly a pseudo nodule.  There are innumerable additional punctate sub cm mixed echogenic though predominantly cystic nodules scattered throughout the remainder of the right lobe of the thyroid.   Left thyroid lobe: Borderline enlarged measuring 6.1 x 2.5 x 2.4 cm.   Left, inferior, anterior - 1.9 x 1.3 x 1.9 cm - anechoic, likely cystic.  *Left, superior, anterior - 2.2 x 1.2 x 1.6 cm - mixed echogenic, partially cystic, predominantly solid.  There are innumerable additional punctate sub cm mixed echogenic though predominantly cystic nodules scattered throughout the remainder of the left lobe of the thyroid.   Isthmus Thickness: Enlarged measuring 0.5 cm in diameter.   Mid, anterior - 0.5 cm - hypoechoic, likely cystic.  There are 2 additional < 3 mm anechoic cysts within the thyroid isthmus.   Lymphadenopathy: None visualized.   IMPRESSION: Findings compatible with multi nodular goiter. The dominant approximately 2.5 cm nodule within the mid aspect of the right lobe of the thyroid as well as the dominant approximately 2.2 cm nodule within the superior, anterior aspect the left lobe of the thyroid both meet imaging criteria to recommend percutaneous sampling as clinically indicated.   Thyroid FNA (06/16/2014): Adequacy Reason Satisfactory For Evaluation. Diagnosis THYROID, FINE NEEDLE ASPIRATION, LEFT LOBE LUP, ANTERIOR, SUPERIOR (SPECIMEN 1 OF 2 COLLECTED ON 06/16/14):  ATYPIA. (BETHESDA CATEGORY III). SEE COMMENT. Jimmy Picket MD Pathologist, Electronic Signature (Case signed 06/17/2014) Specimen Clinical Information Left, Superior, Anterior - 2.2 x 1.2 x 1.6 cm - mixed echogenic, Partially cystic, Predominantly solid Source Thyroid, Fine Needle Aspiration, Left Lobe LUP, Anterior, Superior, (Specimen 1 of 2, collected on 06/16/14 )  Adequacy Reason Satisfactory For Evaluation. Diagnosis THYROID, FINE NEEDLE ASPIRATION,  RIGHT LOBE RMP (SPECIMEN 2 OF 2 COLLECTED 06-16-2014) BENIGN (BETHESDA CATEGORY II). FINDINGS CONSISTENT WITH BENIGN CYSTIC NODULE. Jimmy Picket MD Pathologist, Electronic Signature (Case signed 06/17/2014) Specimen Clinical Information Right, Mid - 2.5 x 1.5 x 1.8 cm - mixed echogenic , Partially cystic partially solid, Slightly ill - defiined, Possibly a pseudo nodule Source Thyroid, Fine Needle Aspiration, Right Lobe RMP, (Specimen 2 of 2, collected on 06/16/14 )  Thyroid uptake and scan (05/16/2018): 4 hour I-123 uptake = 21.5% (normal 5-20%)  24 hour I-123 uptake = 49.8% (normal 10-30%)   IMPRESSION: Elevated 4 hour and 24 hour radio iodine uptakes as above consistent with hyperthyroidism.   When compared to the previous study, the 24 hour radio iodine uptake has increased from the 23.8% on the previous exam.   Homogeneous tracer distribution in both thyroid lobes with suspect mildly hyperfunctional adenoma at the upper pole the LEFT lobe.  RAI tx (07/26/2018)  Thyroid U/S (02/06/2020): Parenchymal Echotexture: Markedly heterogenous  Isthmus: 0.4 cm  Right lobe: 3.7 x 1.4 x 1.1 cm  Left lobe: 3.8 x 1.3 x 1.4 cm  _________________________________________________________   Estimated total number of nodules >/= 1 cm: 2 _________________________________________________________   Nodule # 1: A solid isoechoic nodule in the right upper gland measures 1.3 x 0.9 x 0.6 cm. This nodule does not meet criteria for biopsy or further imaging surveillance. Of note, it is in similar location to the previously biopsied spongiform thyroid nodule and likely represents the involuted remnants of that prior nodule.   Several small nodules are present in the left upper and lower gland. The nodules are either less than 1 cm in size or purely cystic and do not meet criteria for biopsy or further imaging evaluation.   No thyroid nodules of consequence are identified.   IMPRESSION: Small,  heterogeneous thyroid gland with multiple small nodules which do not meet criteria for biopsy or further imaging surveillance. No further follow-up is required.  Pt denies - fatigue - cold intolerance - constipation - hair loss She previously had weight gain, but not recently.  Pt denies: - feeling nodules in neck - hoarseness - dysphagia, only occasionally, positionally - choking  She has + FH of thyroid disorders in many family members. No FH of thyroid cancer. No h/o radiation tx to head or neck. No recent use of iodine supplements.  No herbal supplements.  No recent steroid use.  Pt. 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 She has a history of PE in 12/2021.  ROS: + see HPI  Past Medical History:  Diagnosis Date   Allergy    Arthritis    Cataract    Cervical cancer (HCC)    GERD (gastroesophageal reflux disease)    Glaucoma 2017   History of echocardiogram    Echo 9/16:  Mild LVH, EF 55-60%, no RWMA, Gr 1 DD, mild MR // Echo 1/17:  Mild LVH, EF 50-55%, Gr 1 DD, mild to mod MR, mild LAE   Hypertension    Hyperthyroidism    s/p  RAI treatment   Palpitations    PONV (postoperative nausea and vomiting)    years ago  . Woke up and saw asian people    PONV (postoperative nausea and vomiting) 07/18/2021   Retinal detachment    L eye - partial blindness   Tobacco abuse    Tuberculosis    positive test as a caregiver cxr annually    Urinary incontinence    Past Surgical History:  Procedure Laterality Date   ABDOMINAL HYSTERECTOMY  1984   bladder surgey for incontinence     EYE SURGERY     REPAIR OF COMPLEX TRACTION RETINAL DETACHMENT     thyroid radiation     TOTAL ABDOMINAL HYSTERECTOMY W/ BILATERAL SALPINGOOPHORECTOMY     TOTAL KNEE ARTHROPLASTY Right 04/22/2019   Procedure: RIGHT TOTAL KNEE ARTHROPLASTY;  Surgeon: Marcene Corning, MD;  Location: [facility];  Service:  Orthopedics;  Laterality: Right;  PENDING PRE-OP FOR SDDC APPROVAL   Social History   Socioeconomic History   Marital status: Single    Spouse name: Not on file   Number of children: 2   Years of education: Not on file   Highest education level: Not on file  Occupational History   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   Occupation: Retired  Tobacco Use   Smoking status: Former    Current packs/day: 0.00    Average packs/day: 0.5 packs/day for 48.0 years (24.0 ttl pk-yrs)    Types: Cigarettes    Start date: 11/30/1968    Quit date: 10/31/2016    Years since quitting: 6.2   Smokeless tobacco: Never   Tobacco comments:    did not smoke more than .5 pack   Vaping Use   Vaping status: Never Used  Substance and Sexual Activity   Alcohol use: Not Currently    Alcohol/week: 0.0 standard drinks of alcohol    Comment: rarely once a year   Drug use: No   Sexual activity: Not Currently  Other Topics Concern   Not on file  Social History Narrative   Caregiver   Daughter and grandson in household   Social Determinants of Health   Financial Resource Strain: Low Risk  (12/12/2021)   Overall Financial Resource Strain (CARDIA)    Difficulty of Paying Living Expenses: Not hard at all  Food Insecurity: No Food Insecurity (12/25/2022)   Hunger Vital Sign    Worried About Running Out of Food in the Last Year: Never true    Ran Out of Food in the Last Year: Never true  Transportation Needs: No Transportation Needs (12/25/2022)   PRAPARE - Administrator, Civil Service (Medical): No    Lack of Transportation (Non-Medical): No  Physical Activity: Inactive (12/12/2021)   Exercise Vital Sign    Days of Exercise per Week: 0 days    Minutes of Exercise per Session: 0 min  Stress: No Stress Concern Present (12/12/2021)   [employer] of Occupational Health - Occupational Stress Questionnaire    Feeling of Stress : Not at all  Social Connections: Moderately Isolated  (12/12/2021)   Social Connection and Isolation Panel [NHANES]    Frequency of Communication with Friends and Family: More than three times a week    Frequency of Social Gatherings with Friends and Family: Twice a week    Attends Religious Services: 1 to 4 times per year    Active Member of [employer] or Organizations: No    Attends Banker Meetings:  Never    Marital Status: Never married  Intimate Partner Violence: Not At Risk (12/26/2021)   Humiliation, Afraid, Rape, and Kick questionnaire    Fear of Current or Ex-Partner: No    Emotionally Abused: No    Physically Abused: No    Sexually Abused: No   Current Outpatient Medications on File Prior to Visit  Medication Sig Dispense Refill   acetaminophen (TYLENOL) 325 MG tablet Take 650 mg by mouth every 4 (four) hours as needed for mild pain or moderate pain.     albuterol (PROVENTIL HFA) 108 (90 Base) MCG/ACT inhaler Inhale 2 puffs into the lungs every 6 (six) hours as needed for wheezing or shortness of breath. 18 g 2   albuterol (PROVENTIL) (2.5 MG/3ML) 0.083% nebulizer solution Take 3 mLs (2.5 mg total) by nebulization every 6 (six) hours as needed for wheezing or shortness of breath. 150 mL 1   amLODipine (NORVASC) 2.5 MG tablet Take 1 tablet (2.5 mg total) by mouth daily. 90 tablet 1   brimonidine (ALPHAGAN) 0.2 % ophthalmic solution Place 1 drop into the right eye 2 (two) times daily.     Cholecalciferol (VITAMIN D-3) 25 MCG (1000 UT) CAPS Take 2,000 Units by mouth daily.     cyanocobalamin (VITAMIN B12) 1000 MCG tablet Take 1 tablet (1,000 mcg total) by mouth daily. 30 tablet 2   dorzolamide-timolol (COSOPT) 22.3-6.8 MG/ML ophthalmic solution Place 1 drop into the right eye 2 (two) times daily.     fluticasone-salmeterol (WIXELA INHUB) 250-50 MCG/ACT AEPB Inhale 1 puff into the lungs in the morning and at bedtime. 60 each 5   latanoprost (XALATAN) 0.005 % ophthalmic solution Place 1 drop into both eyes at bedtime.      levothyroxine (SYNTHROID) 125 MCG tablet Take 1 tablet (125 mcg total) by mouth daily. (Patient taking differently: Take 125 mcg by mouth daily before breakfast.) 45 tablet 3   lisinopril (ZESTRIL) 40 MG tablet TAKE 1 TABLET BY MOUTH DAILY 100 tablet 2   metoprolol tartrate (LOPRESSOR) 25 MG tablet Take 1 tablet by mouth twice daily 180 tablet 1   ondansetron (ZOFRAN-ODT) 8 MG disintegrating tablet Take 1 tablet (8 mg total) by mouth every 8 (eight) hours as needed for nausea or vomiting. (Patient taking differently: Take 8 mg by mouth every 8 (eight) hours as needed for nausea or vomiting (dissolve orally).) 12 tablet 0   pantoprazole (PROTONIX) 40 MG tablet TAKE 1 TABLET BY MOUTH TWICE DAILY BEFORE A MEAL 180 tablet 0   polyethylene glycol (MIRALAX / GLYCOLAX) 17 g packet Take 17 g by mouth daily. (Patient taking differently: Take 17 g by mouth daily as needed for mild constipation (mix as directed).) 14 each 0   pravastatin (PRAVACHOL) 10 MG tablet TAKE 1 TABLET BY MOUTH DAILY 100 tablet 2   senna-docusate (SENOKOT-S) 8.6-50 MG tablet Take 2 tablets by mouth 2 (two) times daily. (Patient taking differently: Take 2 tablets by mouth 2 (two) times daily as needed for mild constipation or moderate constipation.) 60 tablet 0   No current facility-administered medications on file prior to visit.   Allergies  Allergen Reactions   Doxycycline Nausea Only   Codeine Nausea And Vomiting   Pollen Extract Itching and Other (See Comments)    Congestion, itchy eyes, runny nose   Family History  Problem Relation Age of Onset   Hypertension Mother    Heart failure Mother    Diabetes Mother    Pancreatic cancer Father    Colon  cancer Maternal Aunt    Cancer Sister    Thyroid disease Paternal Aunt    Cancer Paternal Aunt    Thyroid disease Other        paternal side   Cancer Maternal Grandfather    Diabetes Paternal Grandmother    Diabetes Maternal Uncle    Colon polyps Neg Hx    Stomach cancer Neg  Hx    PE: BP (!) 150/82   Pulse 88   Ht 5' 7.5" (1.715 m)   Wt 208 lb 9.6 oz (94.6 kg)   SpO2 98%   BMI 32.19 kg/m  Wt Readings from Last 3 Encounters:  01/18/23 208 lb 9.6 oz (94.6 kg)  01/09/23 208 lb (94.3 kg)  12/26/22 208 lb (94.3 kg)   Constitutional: overweight, in NAD Eyes:  EOMI, no exophthalmos ENT: no neck masses, thyroid not palpable, no cervical lymphadenopathy Cardiovascular: RRR, No MRG Respiratory: CTA B Musculoskeletal: no deformities Skin:no rashes Neurological: no tremor with outstretched hands  ASSESSMENT: 1.  Post ablative hypothyroidism - after RAI tx for left toxic adenoma  2.  Multinodular goiter  PLAN:  1. Patient with longstanding hypothyroidism, on levothyroxine therapy -TSH was reviewed with the patient, TSH was suppressed: Lab Results  Component Value Date   TSH 0.048 (L) 12/19/2022  -At last visit she was supposed to be on LT4 125 mcg daily, but she was actually on 175 mcg daily.  He was not clear why she changed the dose.  After the above results returned, I advised her to decrease the dose to 125 mcg daily and return for labs in 1.5 months. - we discussed about taking the thyroid hormone every day, with water, >30 minutes before breakfast, separated by >4 hours from acid reflux medications, calcium, iron, multivitamins. Pt. is taking it correctly except for the fact that she does not always wait 30 minutes between levothyroxine and eating breakfast.  She sometimes eats breakfast only few minutes after taking the levothyroxine.  I advised her to always wait at least 30 minutes.  I also advised her to move the levothyroxine on her nightstand to be able to take it as soon as she wakes up. - will check thyroid tests today: TSH and fT4 - If labs are abnormal, she will need to return for repeat TFTs in 1.5 months - we will see her back in 4 mo, but possibly sooner for labs  2.  Multinodular goiter -Diagnosed in 2013 -s/p 2 Bx's in 2016: The  dominant nodule biopsy was benign, while the left nodule biopsy was inconclusive (Bethesda category 3) -Had RAI treatment in 07/2018. -She had a follow-up ultrasound in 2021 and this showed a small thyroid, with only small thyroid nodules for which no follow-up was recommended. -She has no masses felt on palpation of her neck today and no neck compression symptoms.  She describes occasional dysphagia with water, but this is chronic and is not bothersome.  -We did discuss in the past about the fact that RAI treatment can shrink thyroid gland and also nodules -No need for imaging for now, we can continue to follow her expectantly  Component     Latest Ref Rng 01/18/2023  TSH     0.35 - 5.50 uIU/mL 0.04 (L)   T4,Free(Direct)     0.60 - 1.60 ng/dL 3.32   TSH is still suppressed, despite the decreasing dose of LT4.  I will advise her to decrease the dose to 100 mcg daily and repeat the labs in  1.5 months.  Carlus Pavlov, MD PhD [facility]

## 2023-01-18 NOTE — Patient Instructions (Addendum)
Please continue levothyroxine  125 mcg daily.  Take the thyroid hormone every day, with water, FASTING, at least 30 minutes before breakfast, separated by at least 4 hours from: - acid reflux medications - calcium - iron - multivitamins  Please stop at the lab.  Please return in 4 months.

## 2023-01-19 ENCOUNTER — Other Ambulatory Visit: Payer: Self-pay | Admitting: Physician Assistant

## 2023-01-19 ENCOUNTER — Ambulatory Visit: Payer: Self-pay

## 2023-01-19 DIAGNOSIS — D689 Coagulation defect, unspecified: Secondary | ICD-10-CM | POA: Insufficient documentation

## 2023-01-19 DIAGNOSIS — I10 Essential (primary) hypertension: Secondary | ICD-10-CM | POA: Insufficient documentation

## 2023-01-19 LAB — TSH: TSH: 0.04 u[IU]/mL — ABNORMAL LOW (ref 0.35–5.50)

## 2023-01-19 LAB — T4, FREE: Free T4: 1.33 ng/dL (ref 0.60–1.60)

## 2023-01-19 MED ORDER — LEVOTHYROXINE SODIUM 100 MCG PO TABS: 100.0000 ug | ORAL_TABLET | Freq: Every day | ORAL | 3 refills | Status: DC

## 2023-01-19 NOTE — Patient Outreach (Signed)
Care Management  Transitions of Care Program Transitions of Care Post-discharge week 4  01/19/2023 Name: MELLANY SCHLENDER MRN: [id number] DOB: 01/31/1951  Subjective: Kari Coleman is a 72 y.o. year old female who is a primary care patient of [name], [name]. The Care Management team was unable to reach the patient by phone to assess and address transitions of care needs.   Plan: Additional outreach attempts will be made to reach the patient enrolled in the [facility] Program (Post Inpatient/ED Visit).    Antionette Fairy, RN,BSN,CCM RN Care Manager Transitions of Care  [place]/Population Health  Direct Phone: [phone] Toll Free: [phone] Fax: [phone]

## 2023-01-22 ENCOUNTER — Telehealth: Payer: Self-pay

## 2023-01-22 NOTE — Patient Instructions (Signed)
Visit Information  Thank you for taking time to visit with me today. Please don't hesitate to contact me if I can be of assistance to you before our next scheduled telephone appointment.  Our next appointment is by telephone on 01/29/23 at 3pm  Following is a copy of your care plan:   Goals Addressed               This Visit's Progress     TOC Care Plan (pt-stated)        Current Barriers:  Knowledge Deficits related to plan of care for management of HTN   RNCM Clinical Goal(s):  Patient will work with the Care Management team over the next 30 days to address Transition of Care Barriers: ongoing disease mgmt education & support of chronic conditions attend all scheduled medical appointments: pt will complete appt as evidenced by attending PCP appt on 12/26/22          Interventions: Evaluation of current treatment plan related to  self management and patient's adherence to plan as established by provider   Transitions of Care: (Status: Goal on Track (progressing): YES.) SHORT TERM GOAL Assessed fall/safety in the home-pt has new walker and it is using it-plans to start to walking around outside the home Assessed recent provider appts-pt states he saw GYN MD last week- visit went well-she has been scheduled for routine mammogram in the next few weeks, pt also saw endocrinologist for mgmt of thyroid Contacted endocrinologist office after talking with pt-to clarify correct med dosage as note says to take daily, pt picked up new bottle from pharmacy with dosage Reinforced to pt instructions on taking thyroid med Reinforced to pt importance of monitoring BP in the home  Patient Goals/Self-Care Activities: Participate in Transition of Care Program/Attend TOC scheduled calls Take medications as prescribed   Attend all scheduled provider appointments Attend church or other social activities Perform all self care activities independently  Call provider office for new  concerns or questions  Pt will maintain safety in the home by using DME check blood pressure 3 times per week write blood pressure results in a log or diary-pt just getting up-has not checked BP today-states it continues to fluctuate   Follow Up Plan:  Telephone follow up appointment with care management team member scheduled for:  next week The patient has been provided with contact information for the care management team and has been advised to call with any health related questions or concerns.          The patient verbalized understanding of instructions, educational materials, and care plan provided today and DECLINED offer to receive copy of patient instructions, educational materials, and care plan.   The patient has been provided with contact information for the care management team and has been advised to call with any health related questions or concerns.   Please call the care guide team at [phone] if you need to cancel or reschedule your appointment.   Please call the Suicide and Crisis Lifeline: 988 call the [place] National Suicide Prevention Lifeline: 769-718-0544 or TTY: 786-482-4159 TTY 6313761461) to talk to a trained counselor if you are experiencing a Mental Health or Behavioral Health Crisis or need someone to talk to.  Antionette Fairy, RN,BSN,CCM RN Care Manager Transitions of Care  [place]/Population Health  Direct Phone: [phone] Toll Free: [phone] Fax: [phone]

## 2023-01-22 NOTE — Patient Outreach (Signed)
Care Management  Transitions of Care Program Transitions of Care Post-discharge week 4   01/22/2023 Name: Kari Coleman MRN: [id number] DOB: 08/18/1950  Subjective: Kari Coleman is a 72 y.o. year old female who is a primary care patient of [name], [name]. The Care Management team Engaged with patient Engaged with patient by telephone to assess and address transitions of care needs.   Consent to Services:  Patient was given information about care management services, agreed to services, and gave verbal consent to participate.   Assessment:   Patient voices she is doing well-currently eating lunch. She has not checked BP today yet-states she forgot but will do so after she finishes eating. Patient shares that she saw GYN MD last week for routine visit and has upcoming routine mammogram scheduled-unable to recall date. Patient voices that she also saw endocrinologist. States she was was instructed to take Synthroid daily. Noted in med list-167mcg prescribed during visit and sent to pharmacy. Pt got bottle during call and verified that what they picked up from pharmacy on 01/19/23 was and not . States she does have "two old bottles of in the home as well." Patient's daughter has been filling med planner for her and not at home-currently at work. Patient will check with daughter to see which dosage she has been taking since MD visit. RN CM contacted Dr. Estrellita Ludwig message for clinical staff regarding thyroid medicine to clarify dosage and alert them of the issue.         SDOH Interventions    Flowsheet Row Telephone from 12/25/2022 in [facility] Care Coordination Clinical Support from 12/12/2021 in [facility]  SDOH Interventions    Food Insecurity Interventions Intervention Not Indicated Intervention Not Indicated  Housing Interventions -- Intervention Not Indicated  Transportation Interventions Intervention  Not Indicated Intervention Not Indicated  Utilities Interventions -- Intervention Not Indicated  Alcohol Usage Interventions -- Intervention Not Indicated (Score <7)  Financial Strain Interventions -- Intervention Not Indicated  Physical Activity Interventions -- Other (Comments)  [pt is requesting PT]  Social Connections Interventions -- Intervention Not Indicated        Goals Addressed               This Visit's Progress     TOC Care Plan (pt-stated)        Current Barriers:  Knowledge Deficits related to plan of care for management of HTN   RNCM Clinical Goal(s):  Patient will work with the Care Management team over the next 30 days to address Transition of Care Barriers: ongoing disease mgmt education & support of chronic conditions attend all scheduled medical appointments: pt will complete appt as evidenced by attending PCP appt on 12/26/22          Interventions: Evaluation of current treatment plan related to  self management and patient's adherence to plan as established by provider   Transitions of Care: (Status: Goal on Track (progressing): YES.) SHORT TERM GOAL Assessed fall/safety in the home-pt has new walker and it is using it-plans to start to walking around outside the home Assessed recent provider appts-pt states he saw GYN MD last week- visit went well-she has been scheduled for routine mammogram in the next few weeks, pt also saw endocrinologist for mgmt of thyroid Contacted endocrinologist office after talking with pt-to clarify correct med dosage as note says to take daily, pt picked up new bottle from pharmacy with dosage Reinforced to  pt instructions on taking thyroid med Reinforced to pt importance of monitoring BP in the home  Patient Goals/Self-Care Activities: Participate in Transition of Care Program/Attend TOC scheduled calls Take medications as prescribed   Attend all scheduled provider appointments Attend church or other social  activities Perform all self care activities independently  Call provider office for new concerns or questions  Pt will maintain safety in the home by using DME check blood pressure 3 times per week write blood pressure results in a log or diary-pt just getting up-has not checked BP today-states it continues to fluctuate   Follow Up Plan:  Telephone follow up appointment with care management team member scheduled for:  next week The patient has been provided with contact information for the care management team and has been advised to call with any health related questions or concerns.          Plan: The patient has been provided with contact information for the care management team and has been advised to call with any health related questions or concerns.  Follow up appt scheduled with patient for 01/29/23-3pm  Antionette Fairy, RN,BSN,CCM RN Care Manager Transitions of Care  [place]  Direct Phone: [phone] Toll Free: [phone] Fax: [phone]

## 2023-01-23 DIAGNOSIS — R87619 Unspecified abnormal cytological findings in specimens from cervix uteri: Secondary | ICD-10-CM | POA: Insufficient documentation

## 2023-01-29 ENCOUNTER — Ambulatory Visit: Payer: Self-pay

## 2023-01-29 NOTE — Patient Outreach (Signed)
Care Management  Transitions of Care Program Transitions of Care Post-discharge Week 5  01/29/2023 Name: Kari Coleman MRN: [id number] DOB: 01-05-1951  Subjective: Kari Coleman is a 72 y.o. year old female who is a primary care patient of [name], [name]. The Care Management team was unable to reach the patient by phone to assess and address transitions of care needs.   Plan: Additional outreach attempts will be made to reach the patient enrolled in the [facility] Program (Post Inpatient/ED Visit).    Antionette Fairy, RN,BSN,CCM RN Care Manager Transitions of Care  [place]Population [facility]  Direct Phone: [phone] Toll Free: [phone] Fax: [phone]

## 2023-01-31 ENCOUNTER — Telehealth: Payer: Self-pay

## 2023-01-31 NOTE — Patient Outreach (Signed)
Care Management  Transitions of Care Program Transitions of Care Post-discharge Week 5  01/31/2023 Name: Kari Coleman MRN: [id number] DOB: May 23, 1950  Subjective: Kari Coleman is a 72 y.o. year old female who is a primary care patient of [name], [name]. The Care Management team was unable to reach the patient by phone to assess and address transitions of care needs.   Plan: No further outreach attempts will be made at this time.  We have been unable to reach the patient.  Case is being closed as we have been unable to maintain contact with pt.     Goals Addressed               This Visit's Progress     COMPLETED: TOC Care Plan (pt-stated)        Current Barriers:  Knowledge Deficits related to plan of care for management of HTN   RNCM Clinical Goal(s):  Patient will work with the Care Management team over the next 30 days to address Transition of Care Barriers: ongoing disease mgmt education & support of chronic conditions attend all scheduled medical appointments: pt will complete appt as evidenced by attending PCP appt on 12/26/22          Interventions: Evaluation of current treatment plan related to  self management and patient's adherence to plan as established by provider   Transitions of Care: (Status:  Case Closed-unable to maintain contact with pt ) SHORT TERM GOAL   Patient Goals/Self-Care Activities: Participate in Transition of Care Program/Attend TOC scheduled calls Take medications as prescribed   Attend all scheduled provider appointments Attend church or other social activities Perform all self care activities independently  Call provider office for new concerns or questions  Pt will maintain safety in the home by using DME check blood pressure 3 times per week write blood pressure results in a log or diary-pt just getting up-has not checked BP today-states it continues to fluctuate   Follow Up Plan:  We have been unable to make contact with  the patient for follow up. The care management team is available to follow up with the patient after provider conversation with the patient regarding recommendation for care management engagement and subsequent re-referral to the care management team.          Antionette Fairy, RN,BSN,CCM RN Care Manager Transitions of Care  [place]Population [facility]  Direct Phone: [phone] Toll Free: [phone] Fax: [phone]

## 2023-02-02 ENCOUNTER — Telehealth: Payer: Self-pay | Admitting: Physician Assistant

## 2023-02-02 NOTE — Telephone Encounter (Signed)
Patient requests to be called Kari Coleman Patient still be taking Vitamin B

## 2023-02-05 NOTE — Telephone Encounter (Signed)
Pt called back, told her yes needs to continue to take Vit B12  due to Vit B was low. Pt verbalized understanding and said I guess I will need a Rx. Told pt Vit B12 is OTC. Pt verbalized understanding.

## 2023-02-05 NOTE — Telephone Encounter (Signed)
Tried to contact pt voicemail box is full unable to leave message. Will try again tomorrow.

## 2023-03-05 DIAGNOSIS — H402233 Chronic angle-closure glaucoma, bilateral, severe stage: Secondary | ICD-10-CM | POA: Diagnosis not present

## 2023-04-11 ENCOUNTER — Encounter: Payer: Self-pay | Admitting: Physician Assistant

## 2023-04-11 ENCOUNTER — Ambulatory Visit: Payer: Medicare Other | Admitting: Physician Assistant

## 2023-04-11 VITALS — BP 150/90 | HR 60 | Temp 97.6°F | Ht 67.5 in | Wt 205.0 lb

## 2023-04-11 DIAGNOSIS — I10 Essential (primary) hypertension: Secondary | ICD-10-CM | POA: Diagnosis not present

## 2023-04-11 DIAGNOSIS — E538 Deficiency of other specified B group vitamins: Secondary | ICD-10-CM

## 2023-04-11 DIAGNOSIS — K219 Gastro-esophageal reflux disease without esophagitis: Secondary | ICD-10-CM

## 2023-04-11 DIAGNOSIS — E890 Postprocedural hypothyroidism: Secondary | ICD-10-CM | POA: Diagnosis not present

## 2023-04-11 DIAGNOSIS — R2689 Other abnormalities of gait and mobility: Secondary | ICD-10-CM

## 2023-04-11 DIAGNOSIS — Z23 Encounter for immunization: Secondary | ICD-10-CM

## 2023-04-11 DIAGNOSIS — R739 Hyperglycemia, unspecified: Secondary | ICD-10-CM

## 2023-04-11 MED ORDER — PANTOPRAZOLE SODIUM 40 MG PO TBEC: 40.0000 mg | DELAYED_RELEASE_TABLET | Freq: Every day | ORAL | Status: DC

## 2023-04-11 MED ORDER — BLOOD PRESSURE KIT: 1.0000 | PACK | Freq: Once | 0 refills | Status: AC

## 2023-04-11 MED ORDER — AMLODIPINE BESYLATE 5 MG PO TABS: 5.0000 mg | ORAL_TABLET | Freq: Every day | ORAL | 1 refills | Status: DC

## 2023-04-11 NOTE — Progress Notes (Signed)
 Kari Coleman is a 73 y.o. female here for a follow up of a pre-existing problem.  History of Present Illness:   Chief Complaint  Patient presents with   Hypertension     HTN  She reports compliance and good tolerance of 40 mg Lisinopril  daily, 25 mg Metoprolol  tartrate BID, and 2.5 mg Amlodipine  daily.   She is agreeable to increasing the current dose of Amlodipine .  She's currently not checking her blood pressure at home as she doesn't have a monitor.  Blood pressure in office today is 160/100. Patient denies chest pain, SOB, blurred vision, dizziness, unusual headaches, lower leg swelling. Patient is not on medication. Denies excessive caffeine intake, stimulant usage, excessive alcohol intake, or increase in salt consumption.  Heartburn  She reports experiencing heartburn when eating specific foods such as acidic or greasy foods. She reports compliance and good tolerance of pantoprazole  40 mg twice daily.  She's agreeable to decreasing the dose to once daily.  B-12 Deficiency: Patient reports compliance and good tolerance of B-12 supplements. Will check levels today.  Mobility Issues  She reports using a cane most of the time, but is interested in reducing it's usage to be ale to walk more.  She states that her last fall was last summer. She has not experienced any falls since then.  She's agreeable to a physical therapy referral.   Hypothyroidism  Patient reports compliance and good tolerance of 100 mcg synthroid  daily.  She complains of inability to think sharply and being able to easily recall sentences or thoughts.  She often forgets to return items in the kitchen.  She denies misplacing item or forgetting things on.  She reports that her sleep has improved lately.  Past Medical History:  Diagnosis Date   Allergy    Arthritis    Cataract    Cervical cancer (HCC)    GERD (gastroesophageal reflux disease)    Glaucoma 2017   History of echocardiogram    Echo 9/16:   Mild LVH, EF 55-60%, no RWMA, Gr 1 DD, mild MR // Echo 1/17:  Mild LVH, EF 50-55%, Gr 1 DD, mild to mod MR, mild LAE   Hypertension    Hyperthyroidism    s/p RAI treatment   Palpitations    PONV (postoperative nausea and vomiting)    years ago  . Woke up and saw asian people    PONV (postoperative nausea and vomiting) 07/18/2021   Retinal detachment    L eye - partial blindness   Tobacco abuse    Tuberculosis    positive test as a caregiver cxr annually    Urinary incontinence      Social History   Tobacco Use   Smoking status: Former    Current packs/day: 0.00    Average packs/day: 0.5 packs/day for 48.0 years (24.0 ttl pk-yrs)    Types: Cigarettes    Start date: 11/30/1968    Quit date: 10/31/2016    Years since quitting: 6.4   Smokeless tobacco: Never   Tobacco comments:    did not smoke more than .5 pack   Vaping Use   Vaping status: Never Used  Substance Use Topics   Alcohol use: Not Currently    Alcohol/week: 0.0 standard drinks of alcohol    Comment: rarely once a year   Drug use: No    Past Surgical History:  Procedure Laterality Date   ABDOMINAL HYSTERECTOMY  1984   bladder surgey for incontinence     EYE SURGERY  REPAIR OF COMPLEX TRACTION RETINAL DETACHMENT     thyroid  radiation     TOTAL ABDOMINAL HYSTERECTOMY W/ BILATERAL SALPINGOOPHORECTOMY     TOTAL KNEE ARTHROPLASTY Right 04/22/2019   Procedure: RIGHT TOTAL KNEE ARTHROPLASTY;  Surgeon: Sheril Coy, MD;  Location: [facility];  Service: Orthopedics;  Laterality: Right;  PENDING PRE-OP FOR SDDC APPROVAL    Family History  Problem Relation Age of Onset   Hypertension Mother    Heart failure Mother    Diabetes Mother    Pancreatic cancer Father    Colon cancer Maternal Aunt    Cancer Sister    Thyroid  disease Paternal Aunt    Cancer Paternal Aunt    Thyroid  disease Other        paternal side   Cancer Maternal Grandfather    Diabetes Paternal Grandmother    Diabetes Maternal Uncle    Colon  polyps Neg Hx    Stomach cancer Neg Hx     Allergies  Allergen Reactions   Doxycycline  Nausea Only   Codeine Nausea And Vomiting   Pollen Extract Itching and Other (See Comments)    Congestion, itchy eyes, runny nose    Current Medications:   Current Outpatient Medications:    acetaminophen  (TYLENOL ) 325 MG tablet, Take 650 mg by mouth every 4 (four) hours as needed for mild pain or moderate pain., Disp: , Rfl:    albuterol  (PROVENTIL  HFA) 108 (90 Base) MCG/ACT inhaler, Inhale 2 puffs into the lungs every 6 (six) hours as needed for wheezing or shortness of breath., Disp: 18 g, Rfl: 2   albuterol  (PROVENTIL ) (2.5 MG/3ML) 0.083% nebulizer solution, Take 3 mLs (2.5 mg total) by nebulization every 6 (six) hours as needed for wheezing or shortness of breath., Disp: 150 mL, Rfl: 1   amLODipine  (NORVASC ) 2.5 MG tablet, Take 1 tablet (2.5 mg total) by mouth daily., Disp: 90 tablet, Rfl: 1   Blood Pressure KIT, 1 each by Does not apply route once for 1 dose., Disp: 1 kit, Rfl: 0   brimonidine  (ALPHAGAN ) 0.2 % ophthalmic solution, Place 1 drop into the right eye 2 (two) times daily., Disp: , Rfl:    Cholecalciferol  (VITAMIN D -3) 25 MCG (1000 UT) CAPS, Take 2,000 Units by mouth daily., Disp: , Rfl:    cyanocobalamin  (VITAMIN B12) 1000 MCG tablet, Take 1 tablet (1,000 mcg total) by mouth daily., Disp: 30 tablet, Rfl: 2   dorzolamide -timolol  (COSOPT ) 22.3-6.8 MG/ML ophthalmic solution, Place 1 drop into the right eye 2 (two) times daily., Disp: , Rfl:    fluticasone -salmeterol (WIXELA INHUB) 250-50 MCG/ACT AEPB, Inhale 1 puff into the lungs in the morning and at bedtime., Disp: 60 each, Rfl: 5   latanoprost  (XALATAN ) 0.005 % ophthalmic solution, Place 1 drop into both eyes at bedtime., Disp: , Rfl:    levothyroxine  (SYNTHROID ) 100 MCG tablet, Take 1 tablet (100 mcg total) by mouth daily before breakfast., Disp: 45 tablet, Rfl: 3   lisinopril  (ZESTRIL ) 40 MG tablet, TAKE 1 TABLET BY MOUTH DAILY, Disp:  100 tablet, Rfl: 2   metoprolol  tartrate (LOPRESSOR ) 25 MG tablet, Take 1 tablet by mouth twice daily, Disp: 180 tablet, Rfl: 0   ondansetron  (ZOFRAN -ODT) 8 MG disintegrating tablet, Take 1 tablet (8 mg total) by mouth every 8 (eight) hours as needed for nausea or vomiting. (Patient taking differently: Take 8 mg by mouth every 8 (eight) hours as needed for nausea or vomiting (dissolve orally).), Disp: 12 tablet, Rfl: 0   polyethylene glycol (MIRALAX  / GLYCOLAX )  17 g packet, Take 17 g by mouth daily. (Patient taking differently: Take 17 g by mouth daily as needed for mild constipation (mix as directed).), Disp: 14 each, Rfl: 0   pravastatin  (PRAVACHOL ) 10 MG tablet, TAKE 1 TABLET BY MOUTH DAILY, Disp: 100 tablet, Rfl: 2   senna-docusate (SENOKOT-S) 8.6-50 MG tablet, Take 2 tablets by mouth 2 (two) times daily. (Patient taking differently: Take 2 tablets by mouth 2 (two) times daily as needed for mild constipation or moderate constipation.), Disp: 60 tablet, Rfl: 0   pantoprazole  (PROTONIX ) 40 MG tablet, Take 1 tablet (40 mg total) by mouth daily., Disp: , Rfl:    Review of Systems:   Review of Systems  Gastrointestinal:  Positive for heartburn.  Musculoskeletal:  Negative for falls.       +mobility issues  Negative unless otherwise specified per HPI.  Vitals:   Vitals:   04/11/23 1511 04/11/23 1555  BP: (!) 160/100 (!) 150/90  Pulse: 60   Temp: 97.6 F (36.4 C)   TempSrc: Temporal   SpO2: 98%   Weight: 205 lb (93 kg)   Height: 5' 7.5 (1.715 m)      Body mass index is 31.63 kg/m.  Physical Exam:   Physical Exam Vitals and nursing note reviewed.  Constitutional:      General: She is not in acute distress.    Appearance: She is well-developed. She is not ill-appearing or toxic-appearing.  Cardiovascular:     Rate and Rhythm: Normal rate and regular rhythm.     Pulses: Normal pulses.     Heart sounds: Normal heart sounds, S1 normal and S2 normal.  Pulmonary:     Effort:  Pulmonary effort is normal.     Breath sounds: Normal breath sounds.  Skin:    General: Skin is warm and dry.  Neurological:     Mental Status: She is alert.     GCS: GCS eye subscore is 4. GCS verbal subscore is 5. GCS motor subscore is 6.  Psychiatric:        Speech: Speech normal.        Behavior: Behavior normal. Behavior is cooperative.     Assessment and Plan:   Essential hypertension Above goal today No evidence of end-organ damage on my exam Recommend patient monitor home blood pressure at least a few times weekly Continue 40 mg Lisinopril  daily, 25 mg Metoprolol  tartrate BID Increase amlodipine  to 5 mg daily If home monitoring shows consistent elevation, or any symptom(s) develop, recommend reach out to us  for further advice on next steps Blood pressure monitor sent Follow-up in 3-6 month(s), sooner if concerns  B12 deficiency Update B12 and provide recommendations  Elevated blood sugar Update A1c and provide recommendations  Postablative hypothyroidism Update thyroid  labs while here today and fwd to endo provider  Need for prophylactic vaccination against Streptococcus pneumoniae (pneumococcus) completed  Balance disorder Referral to physical therapy Encouraged use of assistive devices at all times  Gastroesophageal reflux disease, unspecified whether esophagitis present Well controlled Decrease protonix  to 40 mg daily Follow-up in 3-6 month(s), sooner if concerns   I,Safa M Kadhim,acting as a scribe for [employer], PA.,have documented all relevant documentation on the behalf of Lucie Buttner, PA,as directed by  Lucie Buttner, PA while in the presence of Lucie Buttner, [name].   I, Lucie Buttner, [name], have reviewed all documentation for this visit. The documentation on 04/11/23 for the exam, diagnosis, procedures, and orders are all accurate and complete.   Lucie Buttner,  PA-C

## 2023-04-11 NOTE — Patient Instructions (Signed)
 It was great to see you!  Pneumonia shot today  Blood pressure monitor sent in Please check blood pressure 2-3 times per week  Increase amlodipine  to 5 mg daily  I will send blood work results to Dr Trixie for review  Let's follow-up in 3-6 months, sooner if you have concerns.  Take care,  Lucie Buttner PA-C

## 2023-04-12 LAB — COMPREHENSIVE METABOLIC PANEL
ALT: 15 U/L (ref 0–35)
AST: 19 U/L (ref 0–37)
Albumin: 4.3 g/dL (ref 3.5–5.2)
Alkaline Phosphatase: 109 U/L (ref 39–117)
BUN: 15 mg/dL (ref 6–23)
CO2: 28 meq/L (ref 19–32)
Calcium: 10.1 mg/dL (ref 8.4–10.5)
Chloride: 101 meq/L (ref 96–112)
Creatinine, Ser: 1.17 mg/dL (ref 0.40–1.20)
GFR: 45.95 mL/min — ABNORMAL LOW (ref >60.00)
Glucose, Bld: 98 mg/dL (ref 70–99)
Potassium: 4.1 meq/L (ref 3.5–5.1)
Sodium: 136 meq/L (ref 135–145)
Total Bilirubin: 0.5 mg/dL (ref 0.2–1.2)
Total Protein: 8 g/dL (ref 6.0–8.3)

## 2023-04-12 LAB — CBC WITH DIFFERENTIAL/PLATELET
Basophils Absolute: 0.1 10*3/uL (ref 0.0–0.1)
Basophils Relative: 0.8 % (ref 0.0–3.0)
Eosinophils Absolute: 0.3 10*3/uL (ref 0.0–0.7)
Eosinophils Relative: 4.2 % (ref 0.0–5.0)
HCT: 41.9 % (ref 36.0–46.0)
Hemoglobin: 14.2 g/dL (ref 12.0–15.0)
Lymphocytes Relative: 42.8 % (ref 12.0–46.0)
Lymphs Abs: 2.9 10*3/uL (ref 0.7–4.0)
MCHC: 33.9 g/dL (ref 30.0–36.0)
MCV: 88.1 fL (ref 78.0–100.0)
Monocytes Absolute: 0.3 10*3/uL (ref 0.1–1.0)
Monocytes Relative: 5.2 % (ref 3.0–12.0)
Neutro Abs: 3.1 10*3/uL (ref 1.4–7.7)
Neutrophils Relative %: 47 % (ref 43.0–77.0)
Platelets: 310 10*3/uL (ref 150.0–400.0)
RBC: 4.75 Mil/uL (ref 3.87–5.11)
RDW: 13 % (ref 11.5–15.5)
WBC: 6.7 10*3/uL (ref 4.0–10.5)

## 2023-04-12 LAB — T4, FREE: Free T4: 0.85 ng/dL (ref 0.60–1.60)

## 2023-04-12 LAB — TSH: TSH: 0.55 u[IU]/mL (ref 0.35–5.50)

## 2023-04-12 LAB — HEMOGLOBIN A1C: Hgb A1c MFr Bld: 5.5 % (ref 4.6–6.5)

## 2023-04-12 LAB — VITAMIN B12: Vitamin B-12: 668 pg/mL (ref 211–911)

## 2023-04-18 NOTE — Therapy (Deleted)
OUTPATIENT PHYSICAL THERAPY LOWER EXTREMITY EVALUATION   Patient Name: Kari Coleman MRN: [id number] DOB:[birth year], 73 y.o., female Today's Date: 04/18/2023  END OF SESSION:   Past Medical History:  Diagnosis Date   Allergy    Arthritis    Cataract    Cervical cancer (HCC)    GERD (gastroesophageal reflux disease)    Glaucoma 2017   History of echocardiogram    Echo 9/16:  Mild LVH, EF 55-60%, no RWMA, Gr 1 DD, mild MR // Echo 1/17:  Mild LVH, EF 50-55%, Gr 1 DD, mild to mod MR, mild LAE   Hypertension    Hyperthyroidism    s/p RAI treatment   Palpitations    PONV (postoperative nausea and vomiting)    years ago  . Woke up and saw asian people    PONV (postoperative nausea and vomiting) 07/18/2021   Retinal detachment    L eye - partial blindness   Tobacco abuse    Tuberculosis    positive test as a caregiver cxr annually    Urinary incontinence    Past Surgical History:  Procedure Laterality Date   ABDOMINAL HYSTERECTOMY  1984   bladder surgey for incontinence     EYE SURGERY     REPAIR OF COMPLEX TRACTION RETINAL DETACHMENT     thyroid radiation     TOTAL ABDOMINAL HYSTERECTOMY W/ BILATERAL SALPINGOOPHORECTOMY     TOTAL KNEE ARTHROPLASTY Right 04/22/2019   Procedure: RIGHT TOTAL KNEE ARTHROPLASTY;  Surgeon: Marcene Corning, MD;  Location: [facility];  Service: Orthopedics;  Laterality: Right;  PENDING PRE-OP FOR SDDC APPROVAL   Patient Active Problem List   Diagnosis Date Noted   UTI (urinary tract infection) 12/19/2022   Acute encephalopathy 12/19/2022   Herpes zoster without complication 08/31/2022   Chronic deep vein thrombosis (DVT) of left popliteal vein (HCC) 12/28/2021   Acute pulmonary embolism (HCC) 12/26/2021   Urinary incontinence 07/18/2021   Tuberculosis 07/18/2021   Tobacco abuse 07/18/2021   Retinal detachment 07/18/2021   PONV (postoperative nausea and vomiting) 07/18/2021   Palpitations 07/18/2021   Hyperthyroidism 07/18/2021   History of  echocardiogram 07/18/2021   GERD (gastroesophageal reflux disease) 07/18/2021   Cervical cancer (HCC) 07/18/2021   Cataract 07/18/2021   Arthritis 07/18/2021   Allergy 07/18/2021   Chronic angle-closure glaucoma of both eyes, severe stage 03/02/2021   Vitamin D deficiency 01/22/2020   Multinodular goiter 01/22/2020   Fatigue 10/21/2019   Primary osteoarthritis of right knee 04/22/2019   Abnormal EKG 04/14/2019   Hypothyroidism 04/11/2019   Elevated glucose 04/08/2019   Chest discomfort 04/25/2018   Ex-smoker 04/25/2018   Primary localized osteoarthritis of right knee 01/04/2016   Glaucoma 2017   Essential hypertension     PCP: ***  REFERRING PROVIDER: ***  REFERRING DIAG: ***  THERAPY DIAG:  No diagnosis found.  Rationale for Evaluation and Treatment: Rehabilitation  ONSET DATE: ***  SUBJECTIVE:   SUBJECTIVE STATEMENT: *** She reports using a cane most of the time, but is interested in reducing it's usage to be ale to walk more.  She states that her last fall was last summer. She has not experienced any falls since the  PERTINENT HISTORY: ***HTN, RTKA PAIN:  Are you having pain? Yes: NPRS scale: *** Pain location: *** Pain description: *** Aggravating factors: *** Relieving factors: ***  PRECAUTIONS: {Therapy precautions:24002}  RED FLAGS: {PT Red Flags:29287}   WEIGHT BEARING RESTRICTIONS: {Yes ***/No:24003}  FALLS:  Has patient fallen in last 6 months? {fallsyesno:27318}  LIVING ENVIRONMENT: Lives with: {[facility] lives with:25569::"lives with their family"} Lives in: {Lives in:25570} Stairs: {opstairs:27293} Has following equipment at home: {Assistive devices:23999}  OCCUPATION: ***  PLOF: {PLOF:24004}  PATIENT GOALS: ***  NEXT MD VISIT: ***  OBJECTIVE:  Note: Objective measures were completed at Evaluation unless otherwise noted.  DIAGNOSTIC FINDINGS: ***  PATIENT SURVEYS:  {rehab surveys:24030}  COGNITION: Overall cognitive status:  {cognition:24006}     SENSATION: {sensation:27233}  EDEMA:  {edema:24020}  MUSCLE LENGTH: Hamstrings: Right *** deg; Left *** deg Maisie Fus test: Right *** deg; Left *** deg  POSTURE: {posture:25561}  PALPATION: ***    LE Measurements Lower Extremity Right EVAL Left EVAL   A/PROM MMT A/PROM MMT  Hip Flexion      Hip Extension      Hip Abduction      Hip Adduction      Hip Internal rotation      Hip External rotation      Knee Flexion      Knee Extension      Ankle Dorsiflexion      Ankle Plantarflexion      Ankle Inversion      Ankle Eversion       (Blank rows = not tested) * pain   LOWER EXTREMITY SPECIAL TESTS:  {LEspecialtests:26242}  FUNCTIONAL TESTS:  {Functional tests:24029}  GAIT: Distance walked: *** Assistive device utilized: {Assistive devices:23999} Level of assistance: {Levels of assistance:24026} Comments: ***                                                                                                                                TREATMENT DATE: ***    PATIENT EDUCATION:  Education details: on current presentation, on HEP, on clinical outcomes score and POC Person educated: Patient Education method: Explanation, Demonstration, and Handouts Education comprehension: verbalized understanding   HOME EXERCISE PROGRAM: ***  ASSESSMENT:  CLINICAL IMPRESSION: Patient is a *** y.o. *** who was seen today for physical therapy evaluation and treatment for ***.   OBJECTIVE IMPAIRMENTS: {opptimpairments:25111}.   ACTIVITY LIMITATIONS: {activitylimitations:27494}  PARTICIPATION LIMITATIONS: {participationrestrictions:25113}  PERSONAL FACTORS: {Personal factors:25162} are also affecting patient's functional outcome.   REHAB POTENTIAL: {rehabpotential:25112}  CLINICAL DECISION MAKING: {clinical decision making:25114}  EVALUATION COMPLEXITY: {Evaluation complexity:25115}   GOALS: Goals reviewed with patient? yes  SHORT TERM  GOALS: Target date: {follow up:25551} *** MAKE TEXT EDITABLE Patient will be independent in self management strategies to improve quality of life and functional outcomes. Baseline: New Program Goal status: INITIAL  2.  Patient will report at least 50% improvement in overall symptoms and/or function to demonstrate improved functional mobility Baseline: 0% better Goal status: INITIAL  3.  *** Baseline:  Goal status: INITIAL  4.  *** Baseline:  Goal status: INITIAL    LONG TERM GOALS: Target date: {follow up:25551} *** MAKE TEXT EDITABLE  Patient will report at least 75% improvement in overall symptoms and/or function to demonstrate improved functional mobility Baseline: 0%  better Goal status: INITIAL  2.  Patient will improve score on FOTO outcomes measure to projected score to demonstrate overall improved function and QOL Baseline: see above Goal status: INITIAL  3.  *** Baseline:  Goal status: INITIAL  4.  *** Baseline:  Goal status: INITIAL   PLAN:  PT FREQUENCY: {rehab frequency:25116}  PT DURATION: {rehab duration:25117}  PLANNED INTERVENTIONS: 97110-Therapeutic exercises, 97530- Therapeutic activity, 97112- Neuromuscular re-education, 97535- Self Care, 16109- Manual therapy, [id number]- Gait training, [id number]- Orthotic Fit/training, [phone]- Canalith repositioning, [id number]- Aquatic Therapy, 97014- Electrical stimulation (unattended), [phone]- Ionotophoresis 4mg /ml Dexamethasone, Patient/Family education, Balance training, Stair training, Taping, Dry Needling, Joint mobilization, Joint manipulation, Spinal manipulation, Spinal mobilization, Cryotherapy, and Moist heat   PLAN FOR NEXT SESSION: ***   1:34 PM, 04/18/23 Tereasa Coop, DPT Physical Therapy with [facility] Lory

## 2023-04-19 ENCOUNTER — Ambulatory Visit: Payer: Medicare Other | Admitting: Physical Therapy

## 2023-05-01 DIAGNOSIS — Z1231 Encounter for screening mammogram for malignant neoplasm of breast: Secondary | ICD-10-CM | POA: Diagnosis not present

## 2023-05-01 LAB — HM MAMMOGRAPHY

## 2023-05-17 ENCOUNTER — Ambulatory Visit: Payer: Medicare Other | Admitting: Physician Assistant

## 2023-05-17 VITALS — BP 152/90 | HR 55 | Temp 97.7°F | Resp 16 | Ht 67.5 in | Wt 204.0 lb

## 2023-05-17 DIAGNOSIS — I10 Essential (primary) hypertension: Secondary | ICD-10-CM

## 2023-05-17 DIAGNOSIS — Z87891 Personal history of nicotine dependence: Secondary | ICD-10-CM | POA: Diagnosis not present

## 2023-05-17 DIAGNOSIS — E890 Postprocedural hypothyroidism: Secondary | ICD-10-CM

## 2023-05-17 DIAGNOSIS — Z711 Person with feared health complaint in whom no diagnosis is made: Secondary | ICD-10-CM | POA: Diagnosis not present

## 2023-05-17 DIAGNOSIS — R2689 Other abnormalities of gait and mobility: Secondary | ICD-10-CM

## 2023-05-17 MED ORDER — AMLODIPINE BESYLATE 10 MG PO TABS: 10.0000 mg | ORAL_TABLET | Freq: Every day | ORAL | 1 refills | Status: DC

## 2023-05-17 NOTE — Progress Notes (Signed)
Kari Coleman is a 73 y.o. female here for a follow up of a pre-existing problem.  History of Present Illness:   Chief Complaint  Patient presents with   Hypertension    Here for follow up    HTN  Reports compliance and good tolerance of Norvasc 5 mg, 25 mg Metoprolol tartrate BID, and lisinopril 40 mg  No significant improvement of HTN since last office visit. Denies any leg swelling.  Agreeable to increasing Norvasc to  10 mg.  She's currently not checking her blood pressure regularly. Believes that when she does, readings are wrong.  Hypothyroidism  Patient reports compliance and good tolerance of 100 mcg synthroid daily. Wondering what the medication actually does.  No concerns or symptoms at this time.  Mental Fog  Complains of occasional mental fog like symptoms including inability to think sharply or recall certain words at times.  Agreeable to a neurology referral.   Balance/Mobility Issues  She denies following up with PT but did have a conversations with them. Plans to follow up soon as she continues to have balance issues and difficulty walking. She also reports experiencing dizziness every morning. She denies using any assistive devices despite availability.    Past Medical History:  Diagnosis Date   Allergy    Arthritis    Cataract    Cervical cancer (HCC)    GERD (gastroesophageal reflux disease)    Glaucoma 2017   History of echocardiogram    Echo 9/16:  Mild LVH, EF 55-60%, no RWMA, Gr 1 DD, mild MR // Echo 1/17:  Mild LVH, EF 50-55%, Gr 1 DD, mild to mod MR, mild LAE   Hypertension    Hyperthyroidism    s/p RAI treatment   Palpitations    PONV (postoperative nausea and vomiting)    years ago  . Woke up and saw asian people    PONV (postoperative nausea and vomiting) 07/18/2021   Retinal detachment    L eye - partial blindness   Tobacco abuse    Tuberculosis    positive test as a caregiver cxr annually    Urinary incontinence      Social History    Tobacco Use   Smoking status: Former    Current packs/day: 0.00    Average packs/day: 0.5 packs/day for 48.0 years (24.0 ttl pk-yrs)    Types: Cigarettes    Start date: 11/30/1968    Quit date: 10/31/2016    Years since quitting: 6.5   Smokeless tobacco: Never   Tobacco comments:    did not smoke more than .5 pack   Vaping Use   Vaping status: Never Used  Substance Use Topics   Alcohol use: Not Currently    Alcohol/week: 0.0 standard drinks of alcohol    Comment: rarely once a year   Drug use: No    Past Surgical History:  Procedure Laterality Date   ABDOMINAL HYSTERECTOMY  1984   bladder surgey for incontinence     EYE SURGERY     REPAIR OF COMPLEX TRACTION RETINAL DETACHMENT     thyroid radiation     TOTAL ABDOMINAL HYSTERECTOMY W/ BILATERAL SALPINGOOPHORECTOMY     TOTAL KNEE ARTHROPLASTY Right 04/22/2019   Procedure: RIGHT TOTAL KNEE ARTHROPLASTY;  Surgeon: Marcene Corning, MD;  Location: [facility];  Service: Orthopedics;  Laterality: Right;  PENDING PRE-OP FOR SDDC APPROVAL    Family History  Problem Relation Age of Onset   Hypertension Mother    Heart failure Mother  Diabetes Mother    Pancreatic cancer Father    Colon cancer Maternal Aunt    Cancer Sister    Thyroid disease Paternal Aunt    Cancer Paternal Aunt    Thyroid disease Other        paternal side   Cancer Maternal Grandfather    Diabetes Paternal Grandmother    Diabetes Maternal Uncle    Colon polyps Neg Hx    Stomach cancer Neg Hx     Allergies  Allergen Reactions   Doxycycline Nausea Only   Codeine Nausea And Vomiting   Pollen Extract Itching and Other (See Comments)    Congestion, itchy eyes, runny nose    Current Medications:   Current Outpatient Medications:    acetaminophen (TYLENOL) 325 MG tablet, Take 650 mg by mouth every 4 (four) hours as needed for mild pain or moderate pain., Disp: , Rfl:    albuterol (PROVENTIL HFA) 108 (90 Base) MCG/ACT inhaler, Inhale 2 puffs into the lungs  every 6 (six) hours as needed for wheezing or shortness of breath., Disp: 18 g, Rfl: 2   albuterol (PROVENTIL) (2.5 MG/3ML) 0.083% nebulizer solution, Take 3 mLs (2.5 mg total) by nebulization every 6 (six) hours as needed for wheezing or shortness of breath., Disp: 150 mL, Rfl: 1   amLODipine (NORVASC) 5 MG tablet, Take 1 tablet (5 mg total) by mouth daily., Disp: 90 tablet, Rfl: 1   brimonidine (ALPHAGAN) 0.2 % ophthalmic solution, Place 1 drop into the right eye 2 (two) times daily., Disp: , Rfl:    Cholecalciferol (VITAMIN D-3) 25 MCG (1000 UT) CAPS, Take 2,000 Units by mouth daily., Disp: , Rfl:    cyanocobalamin (VITAMIN B12) 1000 MCG tablet, Take 1 tablet (1,000 mcg total) by mouth daily., Disp: 30 tablet, Rfl: 2   dorzolamide-timolol (COSOPT) 22.3-6.8 MG/ML ophthalmic solution, Place 1 drop into the right eye 2 (two) times daily., Disp: , Rfl:    fluticasone-salmeterol (WIXELA INHUB) 250-50 MCG/ACT AEPB, Inhale 1 puff into the lungs in the morning and at bedtime., Disp: 60 each, Rfl: 5   latanoprost (XALATAN) 0.005 % ophthalmic solution, Place 1 drop into both eyes at bedtime., Disp: , Rfl:    levothyroxine (SYNTHROID) 100 MCG tablet, Take 1 tablet (100 mcg total) by mouth daily before breakfast., Disp: 45 tablet, Rfl: 3   lisinopril (ZESTRIL) 40 MG tablet, TAKE 1 TABLET BY MOUTH DAILY, Disp: 100 tablet, Rfl: 2   metoprolol tartrate (LOPRESSOR) 25 MG tablet, Take 1 tablet by mouth twice daily, Disp: 180 tablet, Rfl: 0   ondansetron (ZOFRAN-ODT) 8 MG disintegrating tablet, Take 1 tablet (8 mg total) by mouth every 8 (eight) hours as needed for nausea or vomiting. (Patient taking differently: Take 8 mg by mouth every 8 (eight) hours as needed for nausea or vomiting (dissolve orally).), Disp: 12 tablet, Rfl: 0   pantoprazole (PROTONIX) 40 MG tablet, Take 1 tablet (40 mg total) by mouth daily., Disp: , Rfl:    polyethylene glycol (MIRALAX / GLYCOLAX) 17 g packet, Take 17 g by mouth daily. (Patient  taking differently: Take 17 g by mouth daily as needed for mild constipation (mix as directed).), Disp: 14 each, Rfl: 0   pravastatin (PRAVACHOL) 10 MG tablet, TAKE 1 TABLET BY MOUTH DAILY, Disp: 100 tablet, Rfl: 2   senna-docusate (SENOKOT-S) 8.6-50 MG tablet, Take 2 tablets by mouth 2 (two) times daily. (Patient taking differently: Take 2 tablets by mouth 2 (two) times daily as needed for mild constipation or moderate  constipation.), Disp: 60 tablet, Rfl: 0   Review of Systems:   Review of Systems  Neurological:  Positive for dizziness.       +difficulty walking  Psychiatric/Behavioral:  Positive for memory loss.   Negative unless otherwise specified per HPI.   Vitals:   Vitals:   05/17/23 1114  BP: (!) 152/90  Pulse: (!) 55  Resp: 16  Temp: 97.7 F (36.5 C)  TempSrc: Temporal  SpO2: 97%  Weight: 204 lb (92.5 kg)  Height: 5' 7.5" (1.715 m)     Body mass index is 31.48 kg/m.  Physical Exam:   Physical Exam Vitals and nursing note reviewed.  Constitutional:      General: She is not in acute distress.    Appearance: She is well-developed. She is not ill-appearing or toxic-appearing.  Cardiovascular:     Rate and Rhythm: Normal rate and regular rhythm.     Pulses: Normal pulses.     Heart sounds: Normal heart sounds, S1 normal and S2 normal.  Pulmonary:     Effort: Pulmonary effort is normal.     Breath sounds: Normal breath sounds.  Skin:    General: Skin is warm and dry.  Neurological:     Mental Status: She is alert.     GCS: GCS eye subscore is 4. GCS verbal subscore is 5. GCS motor subscore is 6.  Psychiatric:        Speech: Speech normal.        Behavior: Behavior normal. Behavior is cooperative.     Assessment and Plan:   Essential hypertension Above goal today No evidence of end-organ damage on my exam Recommend patient monitor home blood pressure at least a few times weekly Increase Norvasc to 10 mg daily, continue 25 mg Metoprolol tartrate BID, and  lisinopril 40 mg  Consider resuming hydrochloroTHIAZIDE at follow-up If home monitoring shows consistent elevation, or any symptom(s) develop, recommend reach out to [place] for further advice on next steps Follow-up in 1 month  Postablative hypothyroidism Well controlled Continue current management with levothyroxine 100 mcg daily  Balance disorder Encouraged scheduling with physical therapy Order placed at last visit  Stopped smoking with greater than 20 pack year history Agreeable to lung cancer screening -- will place referral  Concern about memory Agreeable to neurology referral   [name], PA-C  I,Safa Judie Petit Kadhim,acting as a scribe for [name], PA.,have documented all relevant documentation on the behalf of [name], PA,as directed by  [name], PA while in the presence of [name], [name].   I, [name], [name], have reviewed all documentation for this visit. The documentation on 05/17/23 for the exam, diagnosis, procedures, and orders are all accurate and complete.

## 2023-05-17 NOTE — Patient Instructions (Addendum)
It was great to see you!  Please schedule physical therapy on your way out  Please increase you amlodipine to 10 mg daily, do not make any additional changes   I will refer you to our Lung Cancer Screening program  Let's follow-up in 1 month, sooner if you have concerns.  Take care,  [name] PA-C

## 2023-05-22 ENCOUNTER — Ambulatory Visit (INDEPENDENT_AMBULATORY_CARE_PROVIDER_SITE_OTHER): Payer: Medicare Other | Admitting: Internal Medicine

## 2023-05-22 ENCOUNTER — Encounter: Payer: Self-pay | Admitting: Internal Medicine

## 2023-05-22 ENCOUNTER — Other Ambulatory Visit: Payer: Self-pay | Admitting: Physician Assistant

## 2023-05-22 VITALS — BP 132/80 | HR 57 | Ht 67.5 in | Wt 206.8 lb

## 2023-05-22 DIAGNOSIS — E042 Nontoxic multinodular goiter: Secondary | ICD-10-CM

## 2023-05-22 DIAGNOSIS — E890 Postprocedural hypothyroidism: Secondary | ICD-10-CM | POA: Diagnosis not present

## 2023-05-22 NOTE — Telephone Encounter (Signed)
Last Fill: 01/19/23  Last OV: 05/17/23 Next OV: 06/15/23  Routing to provider for review/authorization.

## 2023-05-22 NOTE — Telephone Encounter (Unsigned)
Copied from CRM [id number]. Topic: Clinical - Medication Refill >> May 22, 2023  4:16 PM Isabell A wrote: Most Recent Primary Care Visit:  Provider: [name]  Department: [facility]  Visit Type: OFFICE VISIT  Date: 05/17/2023  Medication: metoprolol tartrate (LOPRESSOR) 25 MG tablet  Has the patient contacted their pharmacy? No (Agent: If no, request that the patient contact the pharmacy for the refill. If patient does not wish to contact the pharmacy document the reason why and proceed with request.) (Agent: If yes, when and what did the pharmacy advise?)  Is this the correct pharmacy for this prescription? Yes If no, delete pharmacy and type the correct one.  This is the patient's preferred pharmacy:  [facility] [address], [place] - [address] [place] [address] Phone: [phone] Fax: [phone]   Has the prescription been filled recently? Yes  Is the patient out of the medication? No  Has the patient been seen for an appointment in the last year OR does the patient have an upcoming appointment? Yes  Can we respond through MyChart? No  Agent: Please be advised that Rx refills may take up to 3 business days. We ask that you follow-up with your pharmacy.

## 2023-05-22 NOTE — Patient Instructions (Addendum)
Please continue levothyroxine 100 mcg daily.  Take the thyroid hormone every day, with water, fasting, at least 30 minutes before breakfast, separated by at least 4 hours from: - acid reflux medications - calcium - iron - multivitamins  Please stop at the lab.  Please return in 6 months.

## 2023-05-22 NOTE — Progress Notes (Signed)
Patient ID: Kari Coleman, female   DOB: 1950/11/24, 73 y.o.   MRN: [id number]  HPI  Kari Coleman is a 73 y.o.-year-old female, returning for follow-up for post ablative hypothyroidism for left toxic adenoma and multinodular goiter.  She previously saw Dr. Everardo All, last visit 4 months ago.  Interim history: No weight gain, cold intolerance, constipation. She has L shoulder pain radiating up the neck. She has back pain and uses a walker.  She will start going to physical therapy.  Reviewed hx: Pt. has been dx with a toxic multinodular goiter in 2013.  On the thyroid uptake and scan, she had a left toxic adenoma.  She had RAI treatment in 07/2018 and was started on levothyroxine subsequently. Her levothyroxine dose was gradually decreased from 175 mcg daily to 125 and the 100 mcg daily (last dose change 01/2023)  She takes the levothyroxine 100 mcg daily: - in am - NOT always fasting 30 min from b'fast >> now breakfast moved 30 minutes later - no calcium - no iron - no multivitamins - + Protonix ~1h after LT4 >> now in the evening >> moved to lunchtime - not on Biotin On Vitamin D, B12.  I reviewed pt's thyroid tests: Lab Results  Component Value Date   TSH 0.55 04/11/2023   TSH 0.04 (L) 01/18/2023   TSH 0.048 (L) 12/19/2022   TSH <0.01 repeated and verified (L) 06/09/2022   TSH 0.99 08/12/2021   TSH 1.30 01/26/2021   TSH 10.12 (H) 08/26/2020   TSH 7.88 (H) 05/27/2020   TSH 5.66 (H) 01/22/2020   TSH 5.13 (H) 10/21/2019   FREET4 0.85 04/11/2023   FREET4 1.33 01/18/2023   FREET4 1.51 06/09/2022   FREET4 1.05 01/26/2021   FREET4 0.52 (L) 08/26/2020   FREET4 0.80 05/27/2020   FREET4 0.61 01/22/2020   FREET4 0.75 10/21/2019   FREET4 0.58 (L) 07/11/2019   FREET4 0.86 04/14/2019   T3FREE 5.1 (H) 06/11/2017   Antithyroid antibodies: No results found for: "THGAB" No components found for: "TPOAB"  Thyroid U/S (03/26/2014): There is marked diffuse heterogeneity of thyroid  parenchymal echotexture   Right thyroid lobe.: Normal in size measuring 4.7 x 2.4 x 2.5 cm.   Right, mid - 2.5 x 1.5 x 1.8 cm - mixed echogenic, partially cystic partially solid, slightly ill-defined, possibly a pseudo nodule.  There are innumerable additional punctate sub cm mixed echogenic though predominantly cystic nodules scattered throughout the remainder of the right lobe of the thyroid.   Left thyroid lobe: Borderline enlarged measuring 6.1 x 2.5 x 2.4 cm.   Left, inferior, anterior - 1.9 x 1.3 x 1.9 cm - anechoic, likely cystic.  *Left, superior, anterior - 2.2 x 1.2 x 1.6 cm - mixed echogenic, partially cystic, predominantly solid.  There are innumerable additional punctate sub cm mixed echogenic though predominantly cystic nodules scattered throughout the remainder of the left lobe of the thyroid.   Isthmus Thickness: Enlarged measuring 0.5 cm in diameter.   Mid, anterior - 0.5 cm - hypoechoic, likely cystic.  There are 2 additional < 3 mm anechoic cysts within the thyroid isthmus.   Lymphadenopathy: None visualized.   IMPRESSION: Findings compatible with multi nodular goiter. The dominant approximately 2.5 cm nodule within the mid aspect of the right lobe of the thyroid as well as the dominant approximately 2.2 cm nodule within the superior, anterior aspect the left lobe of the thyroid both meet imaging criteria to recommend percutaneous sampling as clinically indicated.   Thyroid  FNA (06/16/2014): Adequacy Reason Satisfactory For Evaluation. Diagnosis THYROID, FINE NEEDLE ASPIRATION, LEFT LOBE LUP, ANTERIOR, SUPERIOR (SPECIMEN 1 OF 2 COLLECTED ON 06/16/14): ATYPIA. (BETHESDA CATEGORY III). SEE COMMENT. Jimmy Picket MD Pathologist, Electronic Signature (Case signed 06/17/2014) Specimen Clinical Information Left, Superior, Anterior - 2.2 x 1.2 x 1.6 cm - mixed echogenic, Partially cystic, Predominantly solid Source Thyroid, Fine Needle Aspiration, Left Lobe  LUP, Anterior, Superior, (Specimen 1 of 2, collected on 06/16/14 )  Adequacy Reason Satisfactory For Evaluation. Diagnosis THYROID, FINE NEEDLE ASPIRATION, RIGHT LOBE RMP (SPECIMEN 2 OF 2 COLLECTED 06-16-2014) BENIGN (BETHESDA CATEGORY II). FINDINGS CONSISTENT WITH BENIGN CYSTIC NODULE. Jimmy Picket MD Pathologist, Electronic Signature (Case signed 06/17/2014) Specimen Clinical Information Right, Mid - 2.5 x 1.5 x 1.8 cm - mixed echogenic , Partially cystic partially solid, Slightly ill - defiined, Possibly a pseudo nodule Source Thyroid, Fine Needle Aspiration, Right Lobe RMP, (Specimen 2 of 2, collected on 06/16/14 )  Thyroid uptake and scan (05/16/2018): 4 hour I-123 uptake = 21.5% (normal 5-20%)  24 hour I-123 uptake = 49.8% (normal 10-30%)   IMPRESSION: Elevated 4 hour and 24 hour radio iodine uptakes as above consistent with hyperthyroidism.   When compared to the previous study, the 24 hour radio iodine uptake has increased from the 23.8% on the previous exam.   Homogeneous tracer distribution in both thyroid lobes with suspect mildly hyperfunctional adenoma at the upper pole the LEFT lobe.  RAI tx (07/26/2018)  Thyroid U/S (02/06/2020): Parenchymal Echotexture: Markedly heterogenous  Isthmus: 0.4 cm  Right lobe: 3.7 x 1.4 x 1.1 cm  Left lobe: 3.8 x 1.3 x 1.4 cm  _________________________________________________________   Estimated total number of nodules >/= 1 cm: 2 _________________________________________________________   Nodule # 1: A solid isoechoic nodule in the right upper gland measures 1.3 x 0.9 x 0.6 cm. This nodule does not meet criteria for biopsy or further imaging surveillance. Of note, it is in similar location to the previously biopsied spongiform thyroid nodule and likely represents the involuted remnants of that prior nodule.   Several small nodules are present in the left upper and lower gland. The nodules are either less than 1 cm in size or  purely cystic and do not meet criteria for biopsy or further imaging evaluation.   No thyroid nodules of consequence are identified.   IMPRESSION: Small, heterogeneous thyroid gland with multiple small nodules which do not meet criteria for biopsy or further imaging surveillance. No further follow-up is required.  Pt denies - fatigue - cold intolerance - constipation - hair loss - wt gain  Pt denies: - feeling nodules in neck - hoarseness - dysphagia, only occasionally, positionally - choking  She has + FH of thyroid disorders in many family members. No FH of thyroid cancer. No h/o radiation tx to head or neck. No recent use of iodine supplements.  No herbal supplements.  No recent steroid use.  Pt. 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 She has a history of PE in 12/2021.  ROS: + see HPI  Past Medical History:  Diagnosis Date   Allergy    Arthritis    Cataract    Cervical cancer (HCC)    GERD (gastroesophageal reflux disease)    Glaucoma 2017   History of echocardiogram    Echo 9/16:  Mild LVH, EF 55-60%, no RWMA, Gr 1 DD, mild MR // Echo 1/17:  Mild LVH, EF 50-55%, Gr  1 DD, mild to mod MR, mild LAE   Hypertension    Hyperthyroidism    s/p RAI treatment   Palpitations    PONV (postoperative nausea and vomiting)    years ago  . Woke up and saw asian people    PONV (postoperative nausea and vomiting) 07/18/2021   Retinal detachment    L eye - partial blindness   Tobacco abuse    Tuberculosis    positive test as a caregiver cxr annually    Urinary incontinence    Past Surgical History:  Procedure Laterality Date   ABDOMINAL HYSTERECTOMY  1984   bladder surgey for incontinence     EYE SURGERY     REPAIR OF COMPLEX TRACTION RETINAL DETACHMENT     thyroid radiation     TOTAL ABDOMINAL HYSTERECTOMY W/ BILATERAL SALPINGOOPHORECTOMY     TOTAL KNEE ARTHROPLASTY Right  04/22/2019   Procedure: RIGHT TOTAL KNEE ARTHROPLASTY;  Surgeon: Marcene Corning, MD;  Location: [facility];  Service: Orthopedics;  Laterality: Right;  PENDING PRE-OP FOR SDDC APPROVAL   Social History   Socioeconomic History   Marital status: Single    Spouse name: Not on file   Number of children: 2   Years of education: Not on file   Highest education level: Not on file  Occupational History   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   Occupation: Retired  Tobacco Use   Smoking status: Former    Current packs/day: 0.00    Average packs/day: 0.5 packs/day for 48.0 years (24.0 ttl pk-yrs)    Types: Cigarettes    Start date: 11/30/1968    Quit date: 10/31/2016    Years since quitting: 6.5   Smokeless tobacco: Never   Tobacco comments:    did not smoke more than .5 pack   Vaping Use   Vaping status: Never Used  Substance and Sexual Activity   Alcohol use: Not Currently    Alcohol/week: 0.0 standard drinks of alcohol    Comment: rarely once a year   Drug use: No   Sexual activity: Not Currently  Other Topics Concern   Not on file  Social History Narrative   Caregiver   Daughter and grandson in household   Social Drivers of Health   Financial Resource Strain: Low Risk  (12/12/2021)   Overall Financial Resource Strain (CARDIA)    Difficulty of Paying Living Expenses: Not hard at all  Food Insecurity: No Food Insecurity (12/25/2022)   Hunger Vital Sign    Worried About Running Out of Food in the Last Year: Never true    Ran Out of Food in the Last Year: Never true  Transportation Needs: No Transportation Needs (12/25/2022)   PRAPARE - Administrator, Civil Service (Medical): No    Lack of Transportation (Non-Medical): No  Physical Activity: Inactive (12/12/2021)   Exercise Vital Sign    Days of Exercise per Week: 0 days    Minutes of Exercise per Session: 0 min  Stress: No Stress Concern Present (12/12/2021)   [employer] of Occupational Health -  Occupational Stress Questionnaire    Feeling of Stress : Not at all  Social Connections: Moderately Isolated (12/12/2021)   Social Connection and Isolation Panel [NHANES]    Frequency of Communication with Friends and Family: More than three times a week    Frequency of Social Gatherings with Friends and Family: Twice a week    Attends Religious Services: 1 to 4 times per  year    Active Member of Clubs or Organizations: No    Attends Banker Meetings: Never    Marital Status: Never married  Intimate Partner Violence: Not At Risk (12/26/2021)   Humiliation, Afraid, Rape, and Kick questionnaire    Fear of Current or Ex-Partner: No    Emotionally Abused: No    Physically Abused: No    Sexually Abused: No   Current Outpatient Medications on File Prior to Visit  Medication Sig Dispense Refill   acetaminophen (TYLENOL) 325 MG tablet Take 650 mg by mouth every 4 (four) hours as needed for mild pain or moderate pain.     albuterol (PROVENTIL HFA) 108 (90 Base) MCG/ACT inhaler Inhale 2 puffs into the lungs every 6 (six) hours as needed for wheezing or shortness of breath. 18 g 2   albuterol (PROVENTIL) (2.5 MG/3ML) 0.083% nebulizer solution Take 3 mLs (2.5 mg total) by nebulization every 6 (six) hours as needed for wheezing or shortness of breath. 150 mL 1   amLODipine (NORVASC) 10 MG tablet Take 1 tablet (10 mg total) by mouth daily. 90 tablet 1   brimonidine (ALPHAGAN) 0.2 % ophthalmic solution Place 1 drop into the right eye 2 (two) times daily.     Cholecalciferol (VITAMIN D-3) 25 MCG (1000 UT) CAPS Take 2,000 Units by mouth daily.     cyanocobalamin (VITAMIN B12) 1000 MCG tablet Take 1 tablet (1,000 mcg total) by mouth daily. 30 tablet 2   dorzolamide-timolol (COSOPT) 22.3-6.8 MG/ML ophthalmic solution Place 1 drop into the right eye 2 (two) times daily.     fluticasone-salmeterol (WIXELA INHUB) 250-50 MCG/ACT AEPB Inhale 1 puff into the lungs in the morning and at bedtime. 60 each  5   latanoprost (XALATAN) 0.005 % ophthalmic solution Place 1 drop into both eyes at bedtime.     levothyroxine (SYNTHROID) 100 MCG tablet Take 1 tablet (100 mcg total) by mouth daily before breakfast. 45 tablet 3   lisinopril (ZESTRIL) 40 MG tablet TAKE 1 TABLET BY MOUTH DAILY 100 tablet 2   metoprolol tartrate (LOPRESSOR) 25 MG tablet Take 1 tablet by mouth twice daily 180 tablet 0   ondansetron (ZOFRAN-ODT) 8 MG disintegrating tablet Take 1 tablet (8 mg total) by mouth every 8 (eight) hours as needed for nausea or vomiting. (Patient taking differently: Take 8 mg by mouth every 8 (eight) hours as needed for nausea or vomiting (dissolve orally).) 12 tablet 0   pantoprazole (PROTONIX) 40 MG tablet Take 1 tablet (40 mg total) by mouth daily.     polyethylene glycol (MIRALAX / GLYCOLAX) 17 g packet Take 17 g by mouth daily. (Patient taking differently: Take 17 g by mouth daily as needed for mild constipation (mix as directed).) 14 each 0   pravastatin (PRAVACHOL) 10 MG tablet TAKE 1 TABLET BY MOUTH DAILY 100 tablet 2   senna-docusate (SENOKOT-S) 8.6-50 MG tablet Take 2 tablets by mouth 2 (two) times daily. (Patient taking differently: Take 2 tablets by mouth 2 (two) times daily as needed for mild constipation or moderate constipation.) 60 tablet 0   No current facility-administered medications on file prior to visit.   Allergies  Allergen Reactions   Doxycycline Nausea Only   Codeine Nausea And Vomiting   Pollen Extract Itching and Other (See Comments)    Congestion, itchy eyes, runny nose   Family History  Problem Relation Age of Onset   Hypertension Mother    Heart failure Mother    Diabetes Mother  Pancreatic cancer Father    Colon cancer Maternal Aunt    Cancer Sister    Thyroid disease Paternal Aunt    Cancer Paternal Aunt    Thyroid disease Other        paternal side   Cancer Maternal Grandfather    Diabetes Paternal Grandmother    Diabetes Maternal Uncle    Colon polyps Neg  Hx    Stomach cancer Neg Hx    PE: BP 132/80   Pulse (!) 57   Ht 5' 7.5" (1.715 m)   Wt 206 lb 12.8 oz (93.8 kg)   SpO2 96%   BMI 31.91 kg/m  Wt Readings from Last 3 Encounters:  05/22/23 206 lb 12.8 oz (93.8 kg)  05/17/23 204 lb (92.5 kg)  04/11/23 205 lb (93 kg)   Constitutional: overweight, in NAD, walks with a walker Eyes:  EOMI, no exophthalmos ENT: no neck masses, thyroid not palpable, no cervical lymphadenopathy Cardiovascular: RRR, No MRG Respiratory: CTA B Musculoskeletal: no deformities Skin:no rashes Neurological: no tremor with outstretched hands  ASSESSMENT: 1.  Post ablative hypothyroidism - after RAI tx for left toxic adenoma  2.  Multinodular goiter  PLAN:  1. Patient with longstanding hypothyroidism, on levothyroxine therapy - latest thyroid labs reviewed with pt. >> normal: Lab Results  Component Value Date   TSH 0.55 04/11/2023  - she continues on LT4 100 mcg daily, dose decreased at last visit - pt feels good on this dose. - we discussed about taking the thyroid hormone every day, with water, >30 minutes before breakfast, separated by >4 hours from acid reflux medications, calcium, iron, multivitamins. Pt. is taking it correctly.  At last visit I advised her to wait at least 30 minutes from levothyroxine until she eats breakfast.  I also recommended to move the levothyroxine on the nightstand to be able to take it as soon as she wakes up. - will check thyroid tests today: TSH and fT4 - If labs are abnormal, she will need to return for repeat TFTs in 1.5 months - OTW, I will see her back in 6 months  2.  Multinodular goiter -Diagnosed in 2013 -She had 2 biopsies in 2016: The dominant nodule biopsy was benign while the left nodule biopsy was inconclusive (Bethesda category 3) -She had RAI treatment in 07/2018 -She had a follow-up ultrasound in 2021 and this showed a small thyroid with only small thyroid nodules for which no follow-up was  recommended -She has no mass felt on palpation of her neck today and no neck compression symptoms except for occasional dysphagia with water, which is not new and also not bothersome -We did discuss in the past about the fact that RAI treatment is able to shrink thyroid nodules and the site of the thyroid in general -No imaging further needed, we will follow her expectantly  Needs refills.  Orders Placed This Encounter  Procedures   TSH   T4, free   Carlus Pavlov, MD PhD [facility]

## 2023-05-23 ENCOUNTER — Encounter: Payer: Self-pay | Admitting: Internal Medicine

## 2023-05-23 LAB — T4, FREE: Free T4: 1.1 ng/dL (ref 0.8–1.8)

## 2023-05-23 LAB — TSH: TSH: 2.33 m[IU]/L (ref 0.40–4.50)

## 2023-05-23 MED ORDER — METOPROLOL TARTRATE 25 MG PO TABS: 25.0000 mg | ORAL_TABLET | Freq: Two times a day (BID) | ORAL | 0 refills | Status: DC

## 2023-05-23 MED ORDER — LEVOTHYROXINE SODIUM 100 MCG PO TABS: 100.0000 ug | ORAL_TABLET | Freq: Every day | ORAL | 1 refills | Status: DC

## 2023-05-23 NOTE — Addendum Note (Signed)
Addended by: Carlus Pavlov on: 05/23/2023 12:17 PM   Modules accepted: Orders

## 2023-06-14 NOTE — Progress Notes (Signed)
 Kari Coleman is a 73 y.o. female here for a follow up of a pre-existing problem.  History of Present Illness:   No chief complaint on file.   HPI  HTN: Pt is on *** Amlodipine 10 mg once daily, *** Lisinopril 40 mg once daily, *** Lopressor 25 mg once daily.  *** Good compliance and tolerance reported.   ***   Past Medical History:  Diagnosis Date   Allergy    Arthritis    Cataract    Cervical cancer (HCC)    GERD (gastroesophageal reflux disease)    Glaucoma 2017   History of echocardiogram    Echo 9/16:  Mild LVH, EF 55-60%, no RWMA, Gr 1 DD, mild MR // Echo 1/17:  Mild LVH, EF 50-55%, Gr 1 DD, mild to mod MR, mild LAE   Hypertension    Hyperthyroidism    s/p RAI treatment   Palpitations    PONV (postoperative nausea and vomiting)    years ago  . Woke up and saw asian people    PONV (postoperative nausea and vomiting) 07/18/2021   Retinal detachment    L eye - partial blindness   Tobacco abuse    Tuberculosis    positive test as a caregiver cxr annually    Urinary incontinence      Social History   Tobacco Use   Smoking status: Former    Current packs/day: 0.00    Average packs/day: 0.5 packs/day for 48.0 years (24.0 ttl pk-yrs)    Types: Cigarettes    Start date: 11/30/1968    Quit date: 10/31/2016    Years since quitting: 6.6   Smokeless tobacco: Never   Tobacco comments:    did not smoke more than .5 pack   Vaping Use   Vaping status: Never Used  Substance Use Topics   Alcohol use: Not Currently    Alcohol/week: 0.0 standard drinks of alcohol    Comment: rarely once a year   Drug use: No    Past Surgical History:  Procedure Laterality Date   ABDOMINAL HYSTERECTOMY  1984   bladder surgey for incontinence     EYE SURGERY     REPAIR OF COMPLEX TRACTION RETINAL DETACHMENT     thyroid radiation     TOTAL ABDOMINAL HYSTERECTOMY W/ BILATERAL SALPINGOOPHORECTOMY     TOTAL KNEE ARTHROPLASTY Right 04/22/2019   Procedure: RIGHT TOTAL KNEE ARTHROPLASTY;   Surgeon: Marcene Corning, MD;  Location: [facility];  Service: Orthopedics;  Laterality: Right;  PENDING PRE-OP FOR SDDC APPROVAL    Family History  Problem Relation Age of Onset   Hypertension Mother    Heart failure Mother    Diabetes Mother    Pancreatic cancer Father    Colon cancer Maternal Aunt    Cancer Sister    Thyroid disease Paternal Aunt    Cancer Paternal Aunt    Thyroid disease Other        paternal side   Cancer Maternal Grandfather    Diabetes Paternal Grandmother    Diabetes Maternal Uncle    Colon polyps Neg Hx    Stomach cancer Neg Hx     Allergies  Allergen Reactions   Doxycycline Nausea Only   Codeine Nausea And Vomiting   Pollen Extract Itching and Other (See Comments)    Congestion, itchy eyes, runny nose    Current Medications:   Current Outpatient Medications:    acetaminophen (TYLENOL) 325 MG tablet, Take 650 mg by mouth every 4 (four) hours  as needed for mild pain or moderate pain., Disp: , Rfl:    albuterol (PROVENTIL HFA) 108 (90 Base) MCG/ACT inhaler, Inhale 2 puffs into the lungs every 6 (six) hours as needed for wheezing or shortness of breath., Disp: 18 g, Rfl: 2   albuterol (PROVENTIL) (2.5 MG/3ML) 0.083% nebulizer solution, Take 3 mLs (2.5 mg total) by nebulization every 6 (six) hours as needed for wheezing or shortness of breath., Disp: 150 mL, Rfl: 1   amLODipine (NORVASC) 10 MG tablet, Take 1 tablet (10 mg total) by mouth daily., Disp: 90 tablet, Rfl: 1   brimonidine (ALPHAGAN) 0.2 % ophthalmic solution, Place 1 drop into the right eye 2 (two) times daily., Disp: , Rfl:    Cholecalciferol (VITAMIN D-3) 25 MCG (1000 UT) CAPS, Take 2,000 Units by mouth daily., Disp: , Rfl:    cyanocobalamin (VITAMIN B12) 1000 MCG tablet, Take 1 tablet (1,000 mcg total) by mouth daily., Disp: 30 tablet, Rfl: 2   dorzolamide-timolol (COSOPT) 22.3-6.8 MG/ML ophthalmic solution, Place 1 drop into the right eye 2 (two) times daily., Disp: , Rfl:     fluticasone-salmeterol (WIXELA INHUB) 250-50 MCG/ACT AEPB, Inhale 1 puff into the lungs in the morning and at bedtime., Disp: 60 each, Rfl: 5   latanoprost (XALATAN) 0.005 % ophthalmic solution, Place 1 drop into both eyes at bedtime., Disp: , Rfl:    levothyroxine (SYNTHROID) 100 MCG tablet, Take 1 tablet (100 mcg total) by mouth daily before breakfast., Disp: 90 tablet, Rfl: 1   lisinopril (ZESTRIL) 40 MG tablet, TAKE 1 TABLET BY MOUTH DAILY, Disp: 100 tablet, Rfl: 2   metoprolol tartrate (LOPRESSOR) 25 MG tablet, Take 1 tablet (25 mg total) by mouth 2 (two) times daily., Disp: 180 tablet, Rfl: 0   ondansetron (ZOFRAN-ODT) 8 MG disintegrating tablet, Take 1 tablet (8 mg total) by mouth every 8 (eight) hours as needed for nausea or vomiting. (Patient taking differently: Take 8 mg by mouth every 8 (eight) hours as needed for nausea or vomiting (dissolve orally).), Disp: 12 tablet, Rfl: 0   pantoprazole (PROTONIX) 40 MG tablet, Take 1 tablet (40 mg total) by mouth daily., Disp: , Rfl:    polyethylene glycol (MIRALAX / GLYCOLAX) 17 g packet, Take 17 g by mouth daily. (Patient taking differently: Take 17 g by mouth daily as needed for mild constipation (mix as directed).), Disp: 14 each, Rfl: 0   pravastatin (PRAVACHOL) 10 MG tablet, TAKE 1 TABLET BY MOUTH DAILY, Disp: 100 tablet, Rfl: 2   senna-docusate (SENOKOT-S) 8.6-50 MG tablet, Take 2 tablets by mouth 2 (two) times daily. (Patient taking differently: Take 2 tablets by mouth 2 (two) times daily as needed for mild constipation or moderate constipation.), Disp: 60 tablet, Rfl: 0   Review of Systems:   Negative unless otherwise specified per HPI.  Vitals:   There were no vitals filed for this visit.   There is no height or weight on file to calculate BMI.  Physical Exam:   Physical Exam  Assessment and Plan:   There are no diagnoses linked to this encounter.   I, Isabelle Course, acting as a Neurosurgeon for Kari Coleman, Kari Coleman., have documented  all relevant documentation on the behalf of Kari Coleman, Kari Coleman, as directed by  Kari Motto, PA while in the presence of Kari Coleman, Kari Coleman.  I, Isabelle Course, have reviewed all documentation for this visit. The documentation on 06/14/23 for the exam, diagnosis, procedures, and orders are all accurate and complete.  Kari Motto, PA-C

## 2023-06-15 ENCOUNTER — Encounter: Payer: Self-pay | Admitting: Physician Assistant

## 2023-06-15 ENCOUNTER — Ambulatory Visit: Payer: Medicare Other | Admitting: Physician Assistant

## 2023-06-15 VITALS — BP 156/90 | HR 57 | Temp 98.1°F | Ht 67.5 in | Wt 205.5 lb

## 2023-06-15 DIAGNOSIS — Z711 Person with feared health complaint in whom no diagnosis is made: Secondary | ICD-10-CM | POA: Diagnosis not present

## 2023-06-15 DIAGNOSIS — I10 Essential (primary) hypertension: Secondary | ICD-10-CM

## 2023-06-15 NOTE — Patient Instructions (Addendum)
 It was great to see you!  Please call [facility] to schedule visit -- they have been trying to reach you --> [phone]  Please start taking amlodipine 10 mg daily Continue the other blood pressure medications as they are prescribed  Let's follow-up in 3 months, sooner if you have concerns.  Take care,  [name] PA-C

## 2023-06-25 ENCOUNTER — Emergency Department (HOSPITAL_COMMUNITY)
Admission: EM | Admit: 2023-06-25 | Discharge: 2023-06-26 | Disposition: A | Discharge status: Home / Self Care | Attending: Emergency Medicine | Admitting: Emergency Medicine

## 2023-06-25 ENCOUNTER — Encounter (HOSPITAL_COMMUNITY): Payer: Self-pay | Admitting: Emergency Medicine

## 2023-06-25 DIAGNOSIS — Z79899 Other long term (current) drug therapy: Secondary | ICD-10-CM | POA: Insufficient documentation

## 2023-06-25 DIAGNOSIS — Z8541 Personal history of malignant neoplasm of cervix uteri: Secondary | ICD-10-CM | POA: Insufficient documentation

## 2023-06-25 DIAGNOSIS — I10 Essential (primary) hypertension: Secondary | ICD-10-CM | POA: Diagnosis not present

## 2023-06-25 DIAGNOSIS — E039 Hypothyroidism, unspecified: Secondary | ICD-10-CM | POA: Diagnosis not present

## 2023-06-25 DIAGNOSIS — R112 Nausea with vomiting, unspecified: Secondary | ICD-10-CM | POA: Diagnosis not present

## 2023-06-25 DIAGNOSIS — K5730 Diverticulosis of large intestine without perforation or abscess without bleeding: Secondary | ICD-10-CM | POA: Diagnosis not present

## 2023-06-25 DIAGNOSIS — R509 Fever, unspecified: Secondary | ICD-10-CM | POA: Diagnosis not present

## 2023-06-25 DIAGNOSIS — N3000 Acute cystitis without hematuria: Secondary | ICD-10-CM | POA: Insufficient documentation

## 2023-06-25 DIAGNOSIS — R1030 Lower abdominal pain, unspecified: Secondary | ICD-10-CM | POA: Diagnosis present

## 2023-06-25 DIAGNOSIS — I499 Cardiac arrhythmia, unspecified: Secondary | ICD-10-CM | POA: Diagnosis not present

## 2023-06-25 DIAGNOSIS — R197 Diarrhea, unspecified: Secondary | ICD-10-CM | POA: Diagnosis not present

## 2023-06-25 DIAGNOSIS — R188 Other ascites: Secondary | ICD-10-CM | POA: Diagnosis not present

## 2023-06-25 LAB — CBC
HCT: 47 % — ABNORMAL HIGH (ref 36.0–46.0)
Hemoglobin: 15 g/dL (ref 12.0–15.0)
MCH: 29.1 pg (ref 26.0–34.0)
MCHC: 31.9 g/dL (ref 30.0–36.0)
MCV: 91.3 fL (ref 80.0–100.0)
Platelets: 299 10*3/uL (ref 150–400)
RBC: 5.15 MIL/uL — ABNORMAL HIGH (ref 3.87–5.11)
RDW: 12.8 % (ref 11.5–15.5)
WBC: 7 10*3/uL (ref 4.0–10.5)
nRBC: 0 % (ref 0.0–0.2)

## 2023-06-25 NOTE — ED Triage Notes (Signed)
 BIBA Per EMS: Pt c/o n/v/d since this morning.  4mg  zofran given en route.  20G R arm

## 2023-06-26 ENCOUNTER — Telehealth: Payer: Self-pay | Admitting: Physician Assistant

## 2023-06-26 ENCOUNTER — Emergency Department (HOSPITAL_COMMUNITY)

## 2023-06-26 DIAGNOSIS — R188 Other ascites: Secondary | ICD-10-CM | POA: Diagnosis not present

## 2023-06-26 DIAGNOSIS — K5730 Diverticulosis of large intestine without perforation or abscess without bleeding: Secondary | ICD-10-CM | POA: Diagnosis not present

## 2023-06-26 LAB — URINALYSIS, ROUTINE W REFLEX MICROSCOPIC
Bilirubin Urine: NEGATIVE
Glucose, UA: NEGATIVE mg/dL
Ketones, ur: NEGATIVE mg/dL
Nitrite: POSITIVE — AB
Protein, ur: NEGATIVE mg/dL
Specific Gravity, Urine: 1.025 (ref 1.005–1.030)
pH: 5 (ref 5.0–8.0)

## 2023-06-26 LAB — COMPREHENSIVE METABOLIC PANEL
ALT: 19 U/L (ref 0–44)
AST: 21 U/L (ref 15–41)
Albumin: 4 g/dL (ref 3.5–5.0)
Alkaline Phosphatase: 89 U/L (ref 38–126)
Anion gap: 11 (ref 5–15)
BUN: 26 mg/dL — ABNORMAL HIGH (ref 8–23)
CO2: 18 mmol/L — ABNORMAL LOW (ref 22–32)
Calcium: 9.8 mg/dL (ref 8.9–10.3)
Chloride: 106 mmol/L (ref 98–111)
Creatinine, Ser: 1.13 mg/dL — ABNORMAL HIGH (ref 0.44–1.00)
GFR, Estimated: 51 mL/min — ABNORMAL LOW (ref >60)
Glucose, Bld: 144 mg/dL — ABNORMAL HIGH (ref 70–99)
Potassium: 3.9 mmol/L (ref 3.5–5.1)
Sodium: 135 mmol/L (ref 135–145)
Total Bilirubin: 0.7 mg/dL (ref 0.0–1.2)
Total Protein: 8.1 g/dL (ref 6.5–8.1)

## 2023-06-26 LAB — RESP PANEL BY RT-PCR (RSV, FLU A&B, COVID)  RVPGX2
Influenza A by PCR: NEGATIVE
Influenza B by PCR: NEGATIVE
Resp Syncytial Virus by PCR: NEGATIVE
SARS Coronavirus 2 by RT PCR: NEGATIVE

## 2023-06-26 LAB — LIPASE, BLOOD: Lipase: 23 U/L (ref 11–51)

## 2023-06-26 MED ORDER — SODIUM CHLORIDE (PF) 0.9 % IJ SOLN
INTRAMUSCULAR | Status: AC
  Filled 2023-06-26: qty 50

## 2023-06-26 MED ORDER — ACETAMINOPHEN 325 MG PO TABS
650.0000 mg | ORAL_TABLET | Freq: Once | ORAL | Status: AC
  Administered 2023-06-26: 650 mg via ORAL
  Filled 2023-06-26: qty 2

## 2023-06-26 MED ORDER — SODIUM CHLORIDE 0.9 % IV BOLUS
1000.0000 mL | Freq: Once | INTRAVENOUS | Status: AC
  Administered 2023-06-26: 1000 mL via INTRAVENOUS

## 2023-06-26 MED ORDER — ONDANSETRON HCL 4 MG/2ML IJ SOLN
4.0000 mg | Freq: Once | INTRAMUSCULAR | Status: AC
  Administered 2023-06-26: 4 mg via INTRAVENOUS
  Filled 2023-06-26: qty 2

## 2023-06-26 MED ORDER — ONDANSETRON HCL 4 MG PO TABS: 4.0000 mg | ORAL_TABLET | Freq: Four times a day (QID) | ORAL | 0 refills | Status: DC

## 2023-06-26 MED ORDER — CEPHALEXIN 500 MG PO CAPS: 500.0000 mg | ORAL_CAPSULE | Freq: Two times a day (BID) | ORAL | 0 refills | Status: AC

## 2023-06-26 MED ORDER — PANTOPRAZOLE SODIUM 40 MG IV SOLR
40.0000 mg | Freq: Once | INTRAVENOUS | Status: AC
  Administered 2023-06-26: 40 mg via INTRAVENOUS
  Filled 2023-06-26: qty 10

## 2023-06-26 MED ORDER — IOHEXOL 300 MG/ML  SOLN
100.0000 mL | Freq: Once | INTRAMUSCULAR | Status: AC | PRN
  Administered 2023-06-26: 100 mL via INTRAVENOUS

## 2023-06-26 MED ORDER — SODIUM CHLORIDE 0.9 % IV SOLN
1.0000 g | Freq: Once | INTRAVENOUS | Status: AC
  Administered 2023-06-26: 1 g via INTRAVENOUS
  Filled 2023-06-26: qty 10

## 2023-06-26 NOTE — Telephone Encounter (Signed)
 FYI, see Triage note. Pt was in the ED yesterday.

## 2023-06-26 NOTE — Telephone Encounter (Signed)
 Was seen in [facility] on 06/25/23  Patient Name First: Kari Last: [facility] Gender: Female DOB: Sep 03, 1950 Age: 73 Y 9 M 9 D Return Phone Number: [phone] (Primary), [phone] (Secondary) Address: City/ State/ Zip: [place]  [address] Client [facility] - Human resources [facility] [employer] Provider [name]- [name] Contact Type Call Who Is Calling Patient / Member / Family / Caregiver Call Type Triage / Clinical Caller Name Fulton Reek Caller Phone Number [phone] Relationship To Patient Daughter Return Phone Number [phone] (Primary) Chief Complaint Vomiting Reason for Call Symptomatic / Request for Health Information Initial Comment Caller states the patient may have food poisoning, not as responding as she usually is, a little slow, vomited and has diarrhea Translation No Nurse Assessment Nurse: Ander Slade, RN, Mardella Layman Date/Time (Eastern Time): 06/25/2023 8:09:47 PM Confirm and document reason for call. If symptomatic, describe symptoms. ---Caller states that mom may have food poisoning. Symptoms started this AM. Denies hx of dementia or alerted cognitive status. Patient A &O X 2-3. Patient has diarrhea and has been incontinent to stool. No normal for pt. Current temp is 98.8. No medications. Hx of stroke and blood clot. Can move both arms and legs. Denies chest pain. Very lethargic. Does the patient have any new or worsening symptoms? ---Yes Will a triage be completed? ---Yes Related visit to physician within the last 2 weeks? ---No Does the PT have any chronic conditions? (i.e. diabetes, asthma, this includes High risk factors for pregnancy, etc.) ---Yes List chronic conditions. ---blood clot, stroke Is this a behavioral health or substance abuse call? ---No Guidelines Guideline Title Affirmed Question Affirmed Notes Nurse Date/Time (Eastern Time) Weakness (Generalized)  and Fatigue Difficult to awaken or acting confused (e.g., disoriented, slurred speech) Ander Slade, RN, Mardella Layman 06/25/2023 8:16:26 PM Disp. Time Lamount Cohen Time) Disposition Final User 06/25/2023 8:19:32 PM Call EMS 911 Now Yes Verl Blalock 06/25/2023 8:24:01 PM 911 Outcome Documentation Ander Slade, RN, Mardella Layman Reason: Called pt and received voicemail. Final Disposition 06/25/2023 8:19:32 PM Call EMS 911 Now Yes Herrington, RN, Verdis Frederickson Disagree/Comply Comply Caller Understands Yes PreDisposition InappropriateToAsk Care Advice Given Per Guideline CALL EMS 911 NOW: * Immediate medical attention is needed. You need to hang up and call 911 (or an ambulance). * Triager Discretion: I'll call you back in a few minutes to be sure you were able to reach them. FIRST AID - LIE DOWN FOR SHOCK: * Lie down with the feet elevated. * Reason: Treatment for shock. CARE ADVICE given per Weakness (Generalized) and Fatigue (Adult) guideline. Referrals GO TO FACILITY UNDECIDED

## 2023-06-26 NOTE — ED Provider Notes (Signed)
 [facility]ERGENCY DEPARTMENT AT [facility] Provider Note   CSN: [id number] Arrival date & time: 06/25/23  2126     History  Chief Complaint  Patient presents with   Kari Coleman   Nausea    Kari Coleman is a 73 y.o. female history of cervical cancer, hypothyroidism, hypertension, GERD presented for 1 day of nausea vomiting diarrhea.  Patient states she also has dysuria and suprapubic Coleman as well.  Patient denies any fevers.  Family states the patient has not been taking fluids or food today.  Patient denies any chest Coleman or shortness of breath.  According to family there have been no sick contacts.  Home Medications Prior to Admission medications   Medication Sig Start Date End Date Taking? Authorizing Provider  cephALEXin (KEFLEX) 500 MG capsule Take 1 capsule (500 mg total) by mouth 2 (two) times daily for 5 days. 06/26/23 07/01/23 Yes Jacelynn Hayton, Beverly Gust, PA-C  ondansetron (ZOFRAN) 4 MG tablet Take 1 tablet (4 mg total) by mouth every 6 (six) hours. 06/26/23  Yes Netta Corrigan, PA-C  acetaminophen (TYLENOL) 325 MG tablet Take 650 mg by mouth every 4 (four) hours as needed for mild Coleman or moderate Coleman.    [provider]  albuterol (PROVENTIL HFA) 108 (90 Base) MCG/ACT inhaler Inhale 2 puffs into the lungs every 6 (six) hours as needed for wheezing or shortness of breath. 05/31/21   [name], PA  albuterol (PROVENTIL) (2.5 MG/3ML) 0.083% nebulizer solution Take 3 mLs (2.5 mg total) by nebulization every 6 (six) hours as needed for wheezing or shortness of breath. 07/12/21   [name], PA  amLODipine (NORVASC) 10 MG tablet Take 1 tablet (10 mg total) by mouth daily. 05/17/23   [name], PA  brimonidine (ALPHAGAN) 0.2 % ophthalmic solution Place 1 drop into the right eye 2 (two) times daily. 10/20/22   [provider]  Cholecalciferol (VITAMIN D-3) 25 MCG (1000 UT) CAPS Take 2,000 Units by mouth daily.    [provider]   cyanocobalamin (VITAMIN B12) 1000 MCG tablet Take 1 tablet (1,000 mcg total) by mouth daily. 12/21/22   Joseph Art, DO  dorzolamide-timolol (COSOPT) 22.3-6.8 MG/ML ophthalmic solution Place 1 drop into the right eye 2 (two) times daily. 11/12/20   [provider]  fluticasone-salmeterol (WIXELA INHUB) 250-50 MCG/ACT AEPB Inhale 1 puff into the lungs in the morning and at bedtime. 07/13/22   Olalere, Onnie Boer A, MD  latanoprost (XALATAN) 0.005 % ophthalmic solution Place 1 drop into both eyes at bedtime. 11/20/14   [provider]  levothyroxine (SYNTHROID) 100 MCG tablet Take 1 tablet (100 mcg total) by mouth daily before breakfast. 05/23/23   Carlus Pavlov, MD  lisinopril (ZESTRIL) 40 MG tablet TAKE 1 TABLET BY MOUTH DAILY 12/29/22   [name], PA  metoprolol tartrate (LOPRESSOR) 25 MG tablet Take 1 tablet (25 mg total) by mouth 2 (two) times daily. 05/23/23   [name], PA  ondansetron (ZOFRAN-ODT) 8 MG disintegrating tablet Take 1 tablet (8 mg total) by mouth every 8 (eight) hours as needed for nausea or vomiting. Patient taking differently: Take 8 mg by mouth every 8 (eight) hours as needed for nausea or vomiting (dissolve orally). 11/26/22   Linwood Dibbles, MD  pantoprazole (PROTONIX) 40 MG tablet Take 1 tablet (40 mg total) by mouth daily. 04/11/23   [name], PA  polyethylene glycol (MIRALAX / GLYCOLAX) 17 g packet Take 17 g by mouth daily. Patient taking differently:  Take 17 g by mouth daily as needed for mild constipation (mix as directed). 01/03/22   Osvaldo Shipper, MD  pravastatin (PRAVACHOL) 10 MG tablet TAKE 1 TABLET BY MOUTH DAILY 12/29/22   [name], PA  senna-docusate (SENOKOT-S) 8.6-50 MG tablet Take 2 tablets by mouth 2 (two) times daily. Patient taking differently: Take 2 tablets by mouth 2 (two) times daily as needed for mild constipation or moderate constipation. 01/02/22   Osvaldo Shipper, MD      Allergies    Doxycycline, Codeine, and  Pollen extract    Review of Systems   Review of Systems  Gastrointestinal:  Positive for Kari Coleman.    Physical Exam Updated Vital Signs BP 120/88   Pulse 76   Temp (!) 100.9 F (38.3 C) (Axillary)   Resp 18   SpO2 96%  Physical Exam Vitals reviewed.  Constitutional:      General: She is not in acute distress. HENT:     Head: Normocephalic and atraumatic.  Eyes:     Extraocular Movements: Extraocular movements intact.     Conjunctiva/sclera: Conjunctivae normal.     Pupils: Pupils are equal, round, and reactive to light.  Cardiovascular:     Rate and Rhythm: Normal rate and regular rhythm.     Pulses: Normal pulses.     Heart sounds: Normal heart sounds.     Comments: 2+ bilateral radial/dorsalis pedis pulses with regular rate Pulmonary:     Effort: Pulmonary effort is normal. No respiratory distress.     Breath sounds: Normal breath sounds.  Kari:     Palpations: Abdomen is soft.     Tenderness: There is Kari tenderness in the suprapubic area. There is no right CVA tenderness, left CVA tenderness, guarding or rebound. Negative signs include Murphy's sign, Rovsing's sign, McBurney's sign and psoas sign.  Musculoskeletal:        General: Normal range of motion.     Cervical back: Normal range of motion and neck supple.     Comments: 5 out of 5 bilateral grip/leg extension strength  Skin:    General: Skin is warm and dry.     Capillary Refill: Capillary refill takes less than 2 seconds.  Neurological:     General: No focal deficit present.     Mental Status: She is alert and oriented to person, place, and time.     Comments: Sensation intact in all 4 limbs  Psychiatric:        Mood and Affect: Mood normal.     ED Results / Procedures / Treatments   Labs (all labs ordered are listed, but only abnormal results are displayed) Labs Reviewed  CBC - Abnormal; Notable for the following components:      Result Value   RBC 5.15 (*)    HCT 47.0 (*)     All other components within normal limits  URINALYSIS, ROUTINE W REFLEX MICROSCOPIC - Abnormal; Notable for the following components:   Hgb urine dipstick SMALL (*)    Nitrite POSITIVE (*)    Leukocytes,Ua TRACE (*)    Bacteria, UA MANY (*)    All other components within normal limits  COMPREHENSIVE METABOLIC PANEL - Abnormal; Notable for the following components:   CO2 18 (*)    Glucose, Bld 144 (*)    BUN 26 (*)    Creatinine, Ser 1.13 (*)    GFR, Estimated 51 (*)    All other components within normal limits  RESP PANEL BY RT-PCR (RSV, FLU  A&B, COVID)  RVPGX2  URINE CULTURE  LIPASE, BLOOD    EKG None  Radiology CT ABDOMEN PELVIS W CONTRAST Result Date: 06/26/2023 CLINICAL DATA:  Pyelonephritis suspected EXAM: CT ABDOMEN AND PELVIS WITH CONTRAST TECHNIQUE: Multidetector CT imaging of the abdomen and pelvis was performed using the standard protocol following bolus administration of intravenous contrast. RADIATION DOSE REDUCTION: This exam was performed according to the departmental dose-optimization program which includes automated exposure control, adjustment of the mA and/or kV according to patient size and/or use of iterative reconstruction technique. CONTRAST:  OMNIPAQUE IOHEXOL 300 MG/ML  SOLN COMPARISON:  None Available. FINDINGS: Lower chest:  No contributory findings. Hepatobiliary: No focal liver abnormality.No evidence of biliary obstruction or stone. Pancreas: Unremarkable. Spleen: Unremarkable. Adrenals/Urinary Tract: 14 mm left adrenal nodule stable from 2021 and attributed to adenoma. No hydronephrosis or stone. No detected pyelonephritis, although degraded by motion on the corticomedullary phase. Numerous tiny low densities within the renal cortex which are too small for characterization. Unremarkable bladder. Bladder stimulator on the right. Stomach/Bowel: No obstruction. No appendicitis. Mild distal colonic diverticulosis. Vascular/Lymphatic: No acute vascular  abnormality. Diffuse atheromatous calcification of the aorta and iliacs. No mass or adenopathy. Reproductive:Hysterectomy Other: Trace peritoneal fluid in the pelvis. Musculoskeletal: Lumbar spine degeneration with L4-5 anterolisthesis. IMPRESSION: No acute finding. No hydronephrosis, nephrolithiasis, or visible pyelonephritis. Electronically Signed   By: Tiburcio Pea M.D.   On: 06/26/2023 04:18    Procedures Procedures    Medications Ordered in ED Medications  cefTRIAXone (ROCEPHIN) 1 g in sodium chloride 0.9 % 100 mL IVPB (1 g Intravenous New Bag/Given 06/26/23 0423)  sodium chloride 0.9 % bolus 1,000 mL (0 mLs Intravenous Stopped 06/26/23 0320)  ondansetron (ZOFRAN) injection 4 mg (4 mg Intravenous Given 06/26/23 0117)  pantoprazole (PROTONIX) injection 40 mg (40 mg Intravenous Given 06/26/23 0116)  acetaminophen (TYLENOL) tablet 650 mg (650 mg Oral Given 06/26/23 0131)  iohexol (OMNIPAQUE) 300 MG/ML solution 100 mL (100 mLs Intravenous Contrast Given 06/26/23 0341)    ED Course/ Medical Decision Making/ A&P                                 Medical Decision Making Amount and/or Complexity of Data Reviewed Labs: ordered. Radiology: ordered.  Risk OTC drugs. Prescription drug management.   Maureen Chatters 73 y.o. presented today for Kari Coleman.  Working DDx that I considered at this time includes, but not limited to, gastroenteritis, colitis, small bowel obstruction, appendicitis, cholecystitis, hepatobiliary pathology, gastritis, PUD, ACS, aortic dissection, diverticulosis/diverticulitis, pancreatitis, nephrolithiasis, medication induced, AAA, UTI, pyelonephritis, ruptured ectopic pregnancy, PID, ovarian torsion.  R/o DDx: gastroenteritis, colitis, small bowel obstruction, appendicitis, cholecystitis, hepatobiliary pathology, gastritis, PUD, ACS, aortic dissection, diverticulosis/diverticulitis, pancreatitis, nephrolithiasis, medication induced, AAA, pyelonephritis, ruptured ectopic  pregnancy, PID, ovarian torsion: These are considered less likely due to history of present illness, physical exam, labs/imaging findings.  Review of prior external notes: 04/11/2023 office visit  Unique Tests and My Independent Interpretation:  CBC with differential: Unremarkable CMP: Unremarkable Lipase: Unremarkable Respiratory panel: Negative UA: Nitrite positive, pyuria, leukocytes trace Urine culture: Pending CT Abd/Pelvis with contrast: No acute findings  Social Determinants of Health: none  Discussion with Independent Historian:  Daughter and grandson  Discussion of Management of Tests: None  Risk: Medium: prescription drug management  Risk Stratification Score: None  Plan: On exam patient was no acute distress with stable vitals initially.  Patient does have suprapubic tenderness but no  other findings on the physical exam.  Will obtain Kari labs.  Patient given Zofran and Protonix along with fluids for symptoms.  Labs do show UTI along with trace leukocytes and patient did spike a fever 100.9 F will give Tylenol.  Due to patient being out of the risk factors we will get CT imaging however do suspect she has pyelonephritis we will start Rocephin however if the CT scan does not show anything remarkable outside the pyelonephritis do feel patient may be dischargeable as she is not currently septic.  CT was unremarkable.  At this time patient has UTI and will discharge with primary care follow-up along with antibiotics.  Will also give Zofran for any nausea she might have at home.  Patient was successfully p.o. challenged here and safe to be discharged.  Patient was given return precautions. Patient stable for discharge at this time.  Patient verbalized understanding of plan.  This chart was dictated using voice recognition software.  Despite best efforts to proofread,  errors can occur which can change the documentation meaning.         Final Clinical Impression(s)  / ED Diagnoses Final diagnoses:  Acute cystitis without hematuria    Rx / DC Orders ED Discharge Orders          Ordered    cephALEXin (KEFLEX) 500 MG capsule  2 times daily        06/26/23 0436    ondansetron (ZOFRAN) 4 MG tablet  Every 6 hours        06/26/23 0436              Netta Corrigan, PA-C 06/26/23 0437    Palumbo, April, MD 06/26/23 [phone]

## 2023-06-26 NOTE — Discharge Instructions (Signed)
 Please follow-up with your primary care provider in regards Kari Coleman's and ER visit.  Today your labs and imaging show that you have a UTI and you are given your first dose of antibiotics here however will need pick up the rest of the prescription.  I have also prescribed Zofran to help with any nausea might have at home.  If symptoms change or worsen please return to the ER.

## 2023-06-27 ENCOUNTER — Telehealth: Payer: Self-pay

## 2023-06-27 NOTE — Transitions of Care (Post Inpatient/ED Visit) (Signed)
   06/27/2023  Name: Kari Coleman MRN: [id number] DOB: 1950/04/10  Today's TOC FU Call Status: Today's TOC FU Call Status:: Unsuccessful Call (1st Attempt) Unsuccessful Call (1st Attempt) Date: 06/27/23  Attempted to reach the patient regarding the most recent Inpatient/ED visit.  Follow Up Plan: Additional outreach attempts will be made to reach the patient to complete the Transitions of Care (Post Inpatient/ED visit) call.   Signature  Kandis Fantasia, LPN [facility] Health Advisor [place] [facility] You Are. We Are. One [facility] Direct Dial [phone]

## 2023-06-27 NOTE — Transitions of Care (Post Inpatient/ED Visit) (Signed)
 06/27/2023  Name: Kari Coleman MRN: [id number] DOB: 1950-11-16  Today's TOC FU Call Status: Today's TOC FU Call Status:: Successful TOC FU Call Completed Unsuccessful Call (1st Attempt) Date: 06/27/23 [facility] FU Call Complete Date: 06/27/23 Patient's Name and Date of Birth confirmed.  Transition Care Management Follow-up Telephone Call Date of Discharge: 06/26/23 Discharge Facility: [facility]) Type of Discharge: Emergency Department Reason for ED Visit: Other: (Acute cystitis) How have you been since you were released from the hospital?: Better Any questions or concerns?: No  Items Reviewed: Did you receive and understand the discharge instructions provided?: Yes Medications obtained,verified, and reconciled?: Yes (Medications Reviewed) Any new allergies since your discharge?: No Dietary orders reviewed?: NA Do you have support at home?: Yes People in Home: child(ren), adult  Medications Reviewed Today: Medications Reviewed Today     Reviewed by Anthoney Harada, LPN (Licensed Practical Nurse) on 06/27/23 at 1300  Med List Status: <None>   Medication Order Taking? Sig Documenting Provider Last Dose Status Informant  acetaminophen (TYLENOL) 325 MG tablet [id number] Yes Take 650 mg by mouth every 4 (four) hours as needed for mild pain or moderate pain. [provider] Taking Active Family Member  albuterol (PROVENTIL HFA) 108 (90 Base) MCG/ACT inhaler [id number] Yes Inhale 2 puffs into the lungs every 6 (six) hours as needed for wheezing or shortness of breath. [name], [name] Taking Active Family Member  albuterol (PROVENTIL) (2.5 MG/3ML) 0.083% nebulizer solution [id number] Yes Take 3 mLs (2.5 mg total) by nebulization every 6 (six) hours as needed for wheezing or shortness of breath. [name], [name] Taking Active Family Member  amLODipine (NORVASC) 10 MG tablet [id number] Yes Take 1 tablet (10 mg total) by mouth daily. [name], [name] Taking Active    brimonidine (ALPHAGAN) 0.2 % ophthalmic solution [id number] Yes Place 1 drop into the right eye 2 (two) times daily. [provider] Taking Active Family Member           Med Note Antony Madura, TAJUANNA BURNETT   Tue Dec 19, 2022  8:15 PM) "Right eye only," per family  cephALEXin (KEFLEX) 500 MG capsule [id number] Yes Take 1 capsule (500 mg total) by mouth 2 (two) times daily for 5 days. Netta Corrigan, PA-C Taking Active   Cholecalciferol (VITAMIN D-3) 25 MCG (1000 UT) CAPS [id number] Yes Take 2,000 Units by mouth daily. [provider] Taking Active Family Member  cyanocobalamin (VITAMIN B12) 1000 MCG tablet [id number] Yes Take 1 tablet (1,000 mcg total) by mouth daily. Joseph Art, DO Taking Active   dorzolamide-timolol (COSOPT) 22.3-6.8 MG/ML ophthalmic solution [id number] Yes Place 1 drop into the right eye 2 (two) times daily. [provider] Taking Active   fluticasone-salmeterol [facility] INHUB) 250-50 MCG/ACT AEPB [id number] Yes Inhale 1 puff into the lungs in the morning and at bedtime. Tomma Lightning, MD Taking Active Family Member  latanoprost (XALATAN) 0.005 % ophthalmic solution [id number] Yes Place 1 drop into both eyes at bedtime. [provider] Taking Active Family Member           Med Note Antony Madura, TAYLEIGH WETHERELL   Tue Dec 19, 2022  8:17 PM) "Both eyes," per family  levothyroxine (SYNTHROID) 100 MCG tablet [id number] Yes Take 1 tablet (100 mcg total) by mouth daily before breakfast. Carlus Pavlov, MD Taking Active   lisinopril (ZESTRIL) 40 MG tablet [id number] Yes TAKE 1 TABLET BY MOUTH DAILY [name], PA Taking Active   metoprolol tartrate (LOPRESSOR) 25 MG tablet [id number] Yes  Take 1 tablet (25 mg total) by mouth 2 (two) times daily. [name], [name] Taking Active   ondansetron [facility]) 4 MG tablet [id number] Yes Take 1 tablet (4 mg total) by mouth every 6 (six) hours. Netta Corrigan, PA-C Taking Active   ondansetron (ZOFRAN-ODT) 8 MG disintegrating  tablet [id number] Yes Take 1 tablet (8 mg total) by mouth every 8 (eight) hours as needed for nausea or vomiting.  Patient taking differently: Take 8 mg by mouth every 8 (eight) hours as needed for nausea or vomiting (dissolve orally).   Linwood Dibbles, MD Taking Active Family Member  pantoprazole (PROTONIX) 40 MG tablet [id number] Yes Take 1 tablet (40 mg total) by mouth daily. [name], [name] Taking Active   polyethylene glycol (MIRALAX / GLYCOLAX) 17 g packet [id number] Yes Take 17 g by mouth daily.  Patient taking differently: Take 17 g by mouth daily as needed for mild constipation (mix as directed).   Osvaldo Shipper, MD Taking Active Family Member  pravastatin (PRAVACHOL) 10 MG tablet [id number] Yes TAKE 1 TABLET BY MOUTH DAILY [name], PA Taking Active   senna-docusate (SENOKOT-S) 8.6-50 MG tablet [id number] Yes Take 2 tablets by mouth 2 (two) times daily.  Patient taking differently: Take 2 tablets by mouth 2 (two) times daily as needed for mild constipation or moderate constipation.   Osvaldo Shipper, MD Taking Active Family Member            Home Care and Equipment/Supplies: Were Home Health Services Ordered?: NA Any new equipment or medical supplies ordered?: NA  Functional Questionnaire: Do you need assistance with bathing/showering or dressing?: No Do you need assistance with meal preparation?: No Do you need assistance with eating?: No Do you have difficulty maintaining continence: No Do you need assistance with getting out of bed/getting out of a chair/moving?: No Do you have difficulty managing or taking your medications?: No  Follow up appointments reviewed: PCP Follow-up appointment confirmed?: NA Specialist Hospital Follow-up appointment confirmed?: NA Do you need transportation to your follow-up appointment?: No Do you understand care options if your condition(s) worsen?: Yes-patient verbalized understanding    SIGNATURE Kandis Fantasia, LPN [facility]  Health Advisor [place] [facility] You Are. We Are. One [facility] Direct Dial [phone]

## 2023-06-29 LAB — URINE CULTURE: Culture: 50000 — AB

## 2023-06-30 ENCOUNTER — Telehealth (HOSPITAL_BASED_OUTPATIENT_CLINIC_OR_DEPARTMENT_OTHER): Payer: Self-pay | Admitting: *Deleted

## 2023-06-30 NOTE — Telephone Encounter (Signed)
 Post ED Visit - Positive Culture Follow-up: Unsuccessful Patient Follow-up  Culture assessed and recommendations reviewed by:  [x]  Cherylin Mylar, Pharm.D. []  Celedonio Miyamoto, Pharm.D., BCPS AQ-ID []  Garvin Fila, Pharm.D., BCPS []  [place]ina Pillion, Pharm.D., BCPS []  [place], [place].D., BCPS, AAHIVP []  Estella Husk, Pharm.D., BCPS, AAHIVP []  Sherlynn Carbon, PharmD []  Pollyann Samples, PharmD, BCPS  Positive urine culture  []  Patient discharged without antimicrobial prescription and treatment is now indicated [x]  Organism is resistant to prescribed ED discharge antimicrobial []  Patient with positive blood cultures  Recommend stopping Cephalexin and give Fosfomycin 3g PO x 1 per Arthor Captain, PA-C  Unable to contact patient , letter will be sent to address on file  Patsey Berthold 06/30/2023, 11:52 AM

## 2023-07-09 DIAGNOSIS — H402233 Chronic angle-closure glaucoma, bilateral, severe stage: Secondary | ICD-10-CM | POA: Diagnosis not present

## 2023-07-24 ENCOUNTER — Emergency Department (HOSPITAL_COMMUNITY)

## 2023-07-24 ENCOUNTER — Encounter (HOSPITAL_COMMUNITY): Payer: Self-pay

## 2023-07-24 ENCOUNTER — Other Ambulatory Visit: Payer: Self-pay

## 2023-07-24 ENCOUNTER — Inpatient Hospital Stay (HOSPITAL_COMMUNITY)
Admission: EM | Admit: 2023-07-24 | Discharge: 2023-07-27 | DRG: 177 | Disposition: A | Discharge status: Skilled Nursing Facility | Attending: Internal Medicine | Admitting: Internal Medicine

## 2023-07-24 DIAGNOSIS — G9340 Encephalopathy, unspecified: Secondary | ICD-10-CM | POA: Diagnosis not present

## 2023-07-24 DIAGNOSIS — E871 Hypo-osmolality and hyponatremia: Secondary | ICD-10-CM | POA: Diagnosis not present

## 2023-07-24 DIAGNOSIS — H409 Unspecified glaucoma: Secondary | ICD-10-CM | POA: Diagnosis not present

## 2023-07-24 DIAGNOSIS — Z87440 Personal history of urinary (tract) infections: Secondary | ICD-10-CM | POA: Diagnosis not present

## 2023-07-24 DIAGNOSIS — Z8541 Personal history of malignant neoplasm of cervix uteri: Secondary | ICD-10-CM

## 2023-07-24 DIAGNOSIS — Z9109 Other allergy status, other than to drugs and biological substances: Secondary | ICD-10-CM

## 2023-07-24 DIAGNOSIS — M1990 Unspecified osteoarthritis, unspecified site: Secondary | ICD-10-CM | POA: Diagnosis not present

## 2023-07-24 DIAGNOSIS — Z8249 Family history of ischemic heart disease and other diseases of the circulatory system: Secondary | ICD-10-CM

## 2023-07-24 DIAGNOSIS — I5032 Chronic diastolic (congestive) heart failure: Secondary | ICD-10-CM | POA: Diagnosis not present

## 2023-07-24 DIAGNOSIS — U071 COVID-19: Secondary | ICD-10-CM | POA: Diagnosis not present

## 2023-07-24 DIAGNOSIS — E66811 Obesity, class 1: Secondary | ICD-10-CM | POA: Diagnosis present

## 2023-07-24 DIAGNOSIS — R509 Fever, unspecified: Secondary | ICD-10-CM | POA: Diagnosis not present

## 2023-07-24 DIAGNOSIS — Z79890 Hormone replacement therapy (postmenopausal): Secondary | ICD-10-CM

## 2023-07-24 DIAGNOSIS — I10 Essential (primary) hypertension: Secondary | ICD-10-CM | POA: Diagnosis not present

## 2023-07-24 DIAGNOSIS — G9341 Metabolic encephalopathy: Secondary | ICD-10-CM | POA: Diagnosis present

## 2023-07-24 DIAGNOSIS — R6889 Other general symptoms and signs: Secondary | ICD-10-CM | POA: Diagnosis not present

## 2023-07-24 DIAGNOSIS — E039 Hypothyroidism, unspecified: Secondary | ICD-10-CM | POA: Diagnosis not present

## 2023-07-24 DIAGNOSIS — A419 Sepsis, unspecified organism: Secondary | ICD-10-CM | POA: Diagnosis not present

## 2023-07-24 DIAGNOSIS — Z79899 Other long term (current) drug therapy: Secondary | ICD-10-CM

## 2023-07-24 DIAGNOSIS — R54 Age-related physical debility: Secondary | ICD-10-CM | POA: Diagnosis present

## 2023-07-24 DIAGNOSIS — H5462 Unqualified visual loss, left eye, normal vision right eye: Secondary | ICD-10-CM | POA: Diagnosis not present

## 2023-07-24 DIAGNOSIS — Z743 Need for continuous supervision: Secondary | ICD-10-CM | POA: Diagnosis not present

## 2023-07-24 DIAGNOSIS — Z96651 Presence of right artificial knee joint: Secondary | ICD-10-CM | POA: Diagnosis not present

## 2023-07-24 DIAGNOSIS — Z8611 Personal history of tuberculosis: Secondary | ICD-10-CM

## 2023-07-24 DIAGNOSIS — Z90710 Acquired absence of both cervix and uterus: Secondary | ICD-10-CM

## 2023-07-24 DIAGNOSIS — I130 Hypertensive heart and chronic kidney disease with heart failure and stage 1 through stage 4 chronic kidney disease, or unspecified chronic kidney disease: Secondary | ICD-10-CM | POA: Diagnosis not present

## 2023-07-24 DIAGNOSIS — R410 Disorientation, unspecified: Secondary | ICD-10-CM | POA: Diagnosis not present

## 2023-07-24 DIAGNOSIS — Z885 Allergy status to narcotic agent status: Secondary | ICD-10-CM

## 2023-07-24 DIAGNOSIS — K219 Gastro-esophageal reflux disease without esophagitis: Secondary | ICD-10-CM | POA: Diagnosis present

## 2023-07-24 DIAGNOSIS — K760 Fatty (change of) liver, not elsewhere classified: Secondary | ICD-10-CM | POA: Diagnosis not present

## 2023-07-24 DIAGNOSIS — Z881 Allergy status to other antibiotic agents status: Secondary | ICD-10-CM | POA: Diagnosis not present

## 2023-07-24 DIAGNOSIS — N1831 Chronic kidney disease, stage 3a: Secondary | ICD-10-CM | POA: Diagnosis not present

## 2023-07-24 DIAGNOSIS — R4182 Altered mental status, unspecified: Secondary | ICD-10-CM | POA: Diagnosis not present

## 2023-07-24 DIAGNOSIS — Z6831 Body mass index (BMI) 31.0-31.9, adult: Secondary | ICD-10-CM

## 2023-07-24 DIAGNOSIS — Z87891 Personal history of nicotine dependence: Secondary | ICD-10-CM

## 2023-07-24 DIAGNOSIS — I700 Atherosclerosis of aorta: Secondary | ICD-10-CM | POA: Diagnosis present

## 2023-07-24 LAB — URINALYSIS, W/ REFLEX TO CULTURE (INFECTION SUSPECTED)
Bacteria, UA: NONE SEEN
Bilirubin Urine: NEGATIVE
Glucose, UA: NEGATIVE mg/dL
Hgb urine dipstick: NEGATIVE
Ketones, ur: NEGATIVE mg/dL
Leukocytes,Ua: NEGATIVE
Nitrite: NEGATIVE
Protein, ur: NEGATIVE mg/dL
Specific Gravity, Urine: 1.016 (ref 1.005–1.030)
pH: 6 (ref 5.0–8.0)

## 2023-07-24 LAB — RESP PANEL BY RT-PCR (RSV, FLU A&B, COVID)  RVPGX2
Influenza A by PCR: NEGATIVE
Influenza B by PCR: NEGATIVE
Resp Syncytial Virus by PCR: NEGATIVE
SARS Coronavirus 2 by RT PCR: POSITIVE — AB

## 2023-07-24 LAB — CBC WITH DIFFERENTIAL/PLATELET
Abs Immature Granulocytes: 0.02 10*3/uL (ref 0.00–0.07)
Basophils Absolute: 0.1 10*3/uL (ref 0.0–0.1)
Basophils Relative: 1 %
Eosinophils Absolute: 0.1 10*3/uL (ref 0.0–0.5)
Eosinophils Relative: 2 %
HCT: 46.1 % — ABNORMAL HIGH (ref 36.0–46.0)
Hemoglobin: 14.8 g/dL (ref 12.0–15.0)
Immature Granulocytes: 0 %
Lymphocytes Relative: 19 %
Lymphs Abs: 1.3 10*3/uL (ref 0.7–4.0)
MCH: 29.5 pg (ref 26.0–34.0)
MCHC: 32.1 g/dL (ref 30.0–36.0)
MCV: 91.8 fL (ref 80.0–100.0)
Monocytes Absolute: 0.4 10*3/uL (ref 0.1–1.0)
Monocytes Relative: 5 %
Neutro Abs: 5.3 10*3/uL (ref 1.7–7.7)
Neutrophils Relative %: 73 %
Platelets: 244 10*3/uL (ref 150–400)
RBC: 5.02 MIL/uL (ref 3.87–5.11)
RDW: 12.8 % (ref 11.5–15.5)
WBC: 7.2 10*3/uL (ref 4.0–10.5)
nRBC: 0 % (ref 0.0–0.2)

## 2023-07-24 LAB — I-STAT CG4 LACTIC ACID, ED: Lactic Acid, Venous: 1.7 mmol/L (ref 0.5–1.9)

## 2023-07-24 MED ORDER — NIRMATRELVIR/RITONAVIR (PAXLOVID)TABLET: 3.0000 | ORAL_TABLET | Freq: Two times a day (BID) | ORAL | Status: DC

## 2023-07-24 MED ORDER — SODIUM CHLORIDE 0.9 % IV SOLN
1.0000 g | Freq: Once | INTRAVENOUS | Status: AC
  Administered 2023-07-24: 1 g via INTRAVENOUS
  Filled 2023-07-24: qty 20

## 2023-07-24 MED ORDER — ENOXAPARIN SODIUM 40 MG/0.4ML IJ SOSY
40.0000 mg | PREFILLED_SYRINGE | INTRAMUSCULAR | Status: DC
  Administered 2023-07-25 – 2023-07-27 (×3): 40 mg via SUBCUTANEOUS
  Filled 2023-07-24 (×3): qty 0.4

## 2023-07-24 MED ORDER — LEVOTHYROXINE SODIUM 100 MCG PO TABS
100.0000 ug | ORAL_TABLET | Freq: Every day | ORAL | Status: DC
  Administered 2023-07-26 – 2023-07-27 (×2): 100 ug via ORAL
  Filled 2023-07-24 (×2): qty 1

## 2023-07-24 MED ORDER — ADULT MULTIVITAMIN W/MINERALS CH
1.0000 | ORAL_TABLET | Freq: Every day | ORAL | Status: DC
  Administered 2023-07-26 – 2023-07-27 (×2): 1 via ORAL
  Filled 2023-07-24 (×3): qty 1

## 2023-07-24 MED ORDER — METOPROLOL TARTRATE 25 MG PO TABS
25.0000 mg | ORAL_TABLET | Freq: Two times a day (BID) | ORAL | Status: DC
  Administered 2023-07-25 – 2023-07-27 (×4): 25 mg via ORAL
  Filled 2023-07-24 (×5): qty 1

## 2023-07-24 MED ORDER — IOHEXOL 300 MG/ML  SOLN
100.0000 mL | Freq: Once | INTRAMUSCULAR | Status: AC | PRN
  Administered 2023-07-24: 100 mL via INTRAVENOUS

## 2023-07-24 MED ORDER — IOHEXOL 350 MG/ML SOLN: 100.0000 mL | Freq: Once | INTRAVENOUS | Status: DC | PRN

## 2023-07-24 MED ORDER — LACTATED RINGERS IV SOLN: INTRAVENOUS | Status: AC

## 2023-07-24 MED ORDER — IPRATROPIUM-ALBUTEROL 0.5-2.5 (3) MG/3ML IN SOLN
3.0000 mL | Freq: Four times a day (QID) | RESPIRATORY_TRACT | Status: DC
  Administered 2023-07-24 – 2023-07-25 (×2): 3 mL via RESPIRATORY_TRACT
  Filled 2023-07-24 (×2): qty 3

## 2023-07-24 NOTE — H&P (Incomplete)
 History and Physical  Kari Coleman ZOX:[id number] DOB: 1950/04/30 DOA: 07/24/2023  Referring physician: Dr. Val Garin, EDP  PCP: Alexander Iba, [name]  Outpatient Specialists: Pulmonary, ophthalmology. Patient coming from: Home.  Chief Complaint: Altered mental status.  HPI: Kari Coleman is a 73 y.o. female with medical history significant for essential hypertension, chronic HFpEF, hypothyroidism, glaucoma, history of tuberculosis, presents to the ER from home due to altered mental status for the past 3 days.  Associated with increased urinary frequency, recently treated for ESBL E. coli UTI completed antibiotics yesterday.  In the ER, confused, febrile Tmax 101.5 with tachypnea 25, UA negative for pyuria, COVID-19 by PCR positive.  Chest x-ray with no active disease.  CT abdomen pelvis revealed no acute abnormality in the abdomen or pelvis, hepatic steatosis.  Aortic atherosclerosis.  The patient received 1 dose of Merrem  in the ER initially for suspected ESBL E. coli infection.  TR[facility], hospitalist service, was asked to admit due to acute metabolic encephalopathy likely in the setting of COVID-19 viral infection.  ED Course: Tmax 101.5.  BP 160/101, pulse 89, respiration rate 20, O2 saturation 96% on room air.  Lab studies notable for serum bicarb 18, glucose 144, BUN 26, creatinine 1.13.  GFR 51.  Review of Systems: Review of systems as noted in the HPI. All other systems reviewed and are negative.   Past Medical History:  Diagnosis Date   Allergy    Arthritis    Cataract    Cervical cancer (HCC)    GERD (gastroesophageal reflux disease)    Glaucoma 2017   History of echocardiogram    Echo 9/16:  Mild LVH, EF 55-60%, no RWMA, Gr 1 DD, mild MR // Echo 1/17:  Mild LVH, EF 50-55%, Gr 1 DD, mild to mod MR, mild LAE   Hypertension    Hyperthyroidism    s/p RAI treatment   Palpitations    PONV (postoperative nausea and vomiting)    years ago  . Woke up and saw asian people    PONV  (postoperative nausea and vomiting) 07/18/2021   Retinal detachment    L eye - partial blindness   Tobacco abuse    Tuberculosis    positive test as a caregiver cxr annually    Urinary incontinence    Past Surgical History:  Procedure Laterality Date   ABDOMINAL HYSTERECTOMY  1984   bladder surgey for incontinence     EYE SURGERY     REPAIR OF COMPLEX TRACTION RETINAL DETACHMENT     thyroid  radiation     TOTAL ABDOMINAL HYSTERECTOMY W/ BILATERAL SALPINGOOPHORECTOMY     TOTAL KNEE ARTHROPLASTY Right 04/22/2019   Procedure: RIGHT TOTAL KNEE ARTHROPLASTY;  Surgeon: Dayne Even, MD;  Location: [facility];  Service: Orthopedics;  Laterality: Right;  PENDING PRE-OP FOR SDDC APPROVAL    Social History:  reports that she quit smoking about 6 years ago. Her smoking use included cigarettes. She started smoking about 54 years ago. She has a 24 pack-year smoking history. She has never used smokeless tobacco. She reports that she does not currently use alcohol. She reports that she does not use drugs.   Allergies  Allergen Reactions   Doxycycline  Nausea Only   Codeine Nausea And Vomiting   Pollen Extract Itching and Other (See Comments)    Congestion, itchy eyes, runny nose    Family History  Problem Relation Age of Onset   Hypertension Mother    Heart failure Mother    Diabetes Mother  Pancreatic cancer Father    Colon cancer Maternal Aunt    Cancer Sister    Thyroid  disease Paternal Aunt    Cancer Paternal Aunt    Thyroid  disease Other        paternal side   Cancer Maternal Grandfather    Diabetes Paternal Grandmother    Diabetes Maternal Uncle    Colon polyps Neg Hx    Stomach cancer Neg Hx       Prior to Admission medications   Medication Sig Start Date End Date Taking? Authorizing Provider  amLODipine  (NORVASC ) 10 MG tablet Take 1 tablet (10 mg total) by mouth daily. 05/17/23  Yes Alexander Iba, PA  brimonidine  (ALPHAGAN ) 0.2 % ophthalmic solution Place 1 drop into the  right eye at bedtime. 10/20/22  Yes [provider]  Cholecalciferol  (VITAMIN D -3) 25 MCG (1000 UT) CAPS Take 2,000 Units by mouth daily.   Yes [provider]  levothyroxine  (SYNTHROID ) 100 MCG tablet Take 1 tablet (100 mcg total) by mouth daily before breakfast. 05/23/23  Yes Emilie Harden, MD  lisinopril  (ZESTRIL ) 40 MG tablet TAKE 1 TABLET BY MOUTH DAILY 12/29/22  Yes Alexander Iba, PA  metoprolol  tartrate (LOPRESSOR ) 25 MG tablet Take 1 tablet (25 mg total) by mouth 2 (two) times daily. 05/23/23  Yes Alexander Iba, PA  ondansetron  (ZOFRAN -ODT) 8 MG disintegrating tablet Take 1 tablet (8 mg total) by mouth every 8 (eight) hours as needed for nausea or vomiting. Patient taking differently: Take 8 mg by mouth every 8 (eight) hours as needed for nausea or vomiting (dissolve orally). 11/26/22  Yes Trish Furl, MD  pantoprazole  (PROTONIX ) 40 MG tablet Take 1 tablet (40 mg total) by mouth daily. 04/11/23  Yes Alexander Iba, PA  polyethylene glycol (MIRALAX  / GLYCOLAX ) 17 g packet Take 17 g by mouth daily. Patient taking differently: Take 17 g by mouth daily as needed for mild constipation (mix as directed). 01/03/22  Yes Maylene Spear, MD  pravastatin  (PRAVACHOL ) 10 MG tablet TAKE 1 TABLET BY MOUTH DAILY 12/29/22  Yes Alexander Iba, PA  senna-docusate (SENOKOT-S) 8.6-50 MG tablet Take 2 tablets by mouth 2 (two) times daily. Patient taking differently: Take 2 tablets by mouth 2 (two) times daily as needed for mild constipation or moderate constipation. 01/02/22  Yes Krishnan, Gokul, MD  trimethoprim (TRIMPEX) 100 MG tablet Take 100 mg by mouth daily. 07/19/23  Yes [provider]  acetaminophen  (TYLENOL ) 325 MG tablet Take 650 mg by mouth every 4 (four) hours as needed for mild pain or moderate pain.    [provider]  albuterol  (PROVENTIL  HFA) 108 (90 Base) MCG/ACT inhaler Inhale 2 puffs into the lungs every 6 (six) hours as needed for wheezing or shortness of  breath. 05/31/21   Alexander Iba, PA  albuterol  (PROVENTIL ) (2.5 MG/3ML) 0.083% nebulizer solution Take 3 mLs (2.5 mg total) by nebulization every 6 (six) hours as needed for wheezing or shortness of breath. 07/12/21   Alexander Iba, PA  cyanocobalamin  (VITAMIN B12) 1000 MCG tablet Take 1 tablet (1,000 mcg total) by mouth daily. Patient not taking: Reported on 07/24/2023 12/21/22   Vann, Jessica U, DO  dorzolamide -timolol  (COSOPT ) 22.3-6.8 MG/ML ophthalmic solution Place 1 drop into the right eye 2 (two) times daily. Patient not taking: Reported on 07/24/2023 11/12/20   [provider]  fluticasone -salmeterol (WIXELA INHUB) 250-50 MCG/ACT AEPB Inhale 1 puff into the lungs in the morning and at bedtime. Patient not taking: Reported on 07/24/2023 07/13/22   Margaretann Sharper,  MD  latanoprost  (XALATAN ) 0.005 % ophthalmic solution Place 1 drop into both eyes at bedtime. 11/20/14   [provider]  ondansetron  (ZOFRAN ) 4 MG tablet Take 1 tablet (4 mg total) by mouth every 6 (six) hours. 06/26/23   Denese Finn, PA-C    Physical Exam: BP (!) 159/97   Pulse 90   Temp 98.6 F (37 C) (Oral)   Resp 18   Ht 5' 7.5" (1.715 m)   Wt 93.2 kg   SpO2 100%   BMI 31.71 kg/m   General: 73 y.o. year-old female well developed well nourished in no acute distress.  Somnolent. Cardiovascular: Regular rate and rhythm with no rubs or gallops.  No thyromegaly or JVD noted.  No lower extremity edema bilaterally. Respiratory: Faint rales at bases.  Poor inspiratory effort. Abdomen: Soft nontender nondistended with normal bowel sounds x4 quadrants. Muskuloskeletal: No cyanosis, clubbing or edema noted bilaterally Neuro: CN II-XII intact, strength, sensation, reflexes Skin: No ulcerative lesions noted or rashes Psychiatry: Judgement and insight appear altered. Mood is appropriate for condition and setting          Labs on Admission:  Basic Metabolic Panel: No results for input(s): "NA", "K",  "CL", "CO2", "GLUCOSE", "BUN", "CREATININE", "CALCIUM", "MG", "PHOS" in the last 168 hours. Liver Function Tests: No results for input(s): "AST", "ALT", "ALKPHOS", "BILITOT", "PROT", "ALBUMIN" in the last 168 hours. No results for input(s): "LIPASE", "AMYLASE" in the last 168 hours. No results for input(s): "AMMONIA" in the last 168 hours. CBC: Recent Labs  Lab 07/24/23 1851  WBC 7.2  NEUTROABS 5.3  HGB 14.8  HCT 46.1*  MCV 91.8  PLT 244   Cardiac Enzymes: No results for input(s): "CKTOTAL", "CKMB", "CKMBINDEX", "TROPONINI" in the last 168 hours.  BNP (last 3 results) No results for input(s): "BNP" in the last 8760 hours.  ProBNP (last 3 results) No results for input(s): "PROBNP" in the last 8760 hours.  CBG: No results for input(s): "GLUCAP" in the last 168 hours.  Radiological Exams on Admission: CT ABDOMEN PELVIS W CONTRAST Result Date: 07/24/2023 CLINICAL DATA:  Sepsis, UTI, altered mental status EXAM: CT ABDOMEN AND PELVIS WITH CONTRAST TECHNIQUE: Multidetector CT imaging of the abdomen and pelvis was performed using the standard protocol following bolus administration of intravenous contrast. RADIATION DOSE REDUCTION: This exam was performed according to the departmental dose-optimization program which includes automated exposure control, adjustment of the mA and/or kV according to patient size and/or use of iterative reconstruction technique. CONTRAST:  OMNIPAQUE  IOHEXOL  300 MG/ML  SOLN COMPARISON:  06/26/2023 FINDINGS: Lower chest: No acute abnormality. Hepatobiliary: Hepatic steatosis. Normal gallbladder. No biliary dilation. Pancreas: Unremarkable. Spleen: Unremarkable. Adrenals/Urinary Tract: Normal adrenal glands. No urinary calculi or hydronephrosis. Bladder is unremarkable. Stomach/Bowel: Normal caliber large and small bowel. No bowel wall thickening. The appendix is normal.Stomach is within normal limits. Vascular/Lymphatic: Aortic atherosclerosis. No enlarged  abdominal or pelvic lymph nodes. Reproductive: Unremarkable. Other: No free intraperitoneal fluid or air. Musculoskeletal: No acute fracture. IMPRESSION: 1. No acute abnormality in the abdomen or pelvis. 2. Hepatic steatosis. 3. Aortic Atherosclerosis (ICD10-I70.0). Electronically Signed   By: Rozell Cornet M.D.   On: 07/24/2023 20:47   DG Chest Portable 1 View Result Date: 07/24/2023 CLINICAL DATA:  Fever. EXAM: PORTABLE CHEST 1 VIEW COMPARISON:  December 18, 2022. FINDINGS: Stable cardiomediastinal silhouette. Both lungs are clear. The visualized skeletal structures are unremarkable. IMPRESSION: No active disease. Electronically Signed   By: Rosalene Colon M.D.   On: 07/24/2023  18:37    EKG: I independently viewed the EKG done and my findings are as followed: Sinus rhythm rate of 90.  Nonspecific ST-T changes.  QTc 426.  Assessment/Plan Present on Admission:  COVID-19  Principal Problem:   COVID-19  COVID-19 viral infection, POA Not hypoxic, chest x-ray with no active disease Paxlovid  x 5 days Continue airborne precautions  Acute metabolic encephalopathy suspect secondary to COVID-19 viral infection Continue to treat underlying condition Reorient as needed Fall precautions. Aspiration precautions Strict n.p.o. until more alert.  Hypothyroidism Resume home levothyroxine   History of tuberculosis per medical records No active disease on chest x-ray  Obesity BMI 31 Recommend weight loss outpatient with regular physical activity and healthy dieting.  Generalized weakness PT OT assessment Fall precautions    Time: 75 minutes.    DVT prophylaxis: Subcu Lovenox  daily.  Code Status: Full code.  Family Communication: None at bedside.  Disposition Plan: Admitted to telemetry unit.  Consults called: None.  Admission status: Observation status.   Status is: Observation    Bary Boss MD [facility] Pager [phone]  If 7PM-7AM, please contact  night-coverage [email or web address] Password [facility]  07/24/2023, 11:39 PM

## 2023-07-24 NOTE — Progress Notes (Signed)
 ED Pharmacy Antibiotic Sign Off An antibiotic consult was received from an ED provider for Meropenem  per pharmacy dosing for UTI (recent hx ESBL Ecoli). A chart review was completed to assess appropriateness.   The following one time order(s) were placed:  Meropenem  1gm IV  Further antibiotic and/or antibiotic pharmacy consults should be ordered by the admitting provider if indicated.   Thank you for allowing pharmacy to be a part of this patient's care.   Arie Kurtz, [facility]  Clinical Pharmacist 07/24/23 7:04 PM

## 2023-07-24 NOTE — ED Triage Notes (Signed)
 Urinary frequency for the last 3 days, confusion started this AM. Pt just finished antibiotics yesterday for UTI. Pt oriented to self, disoriented to time and situation.

## 2023-07-24 NOTE — ED Notes (Signed)
 Per provider ok to not collect second blood culture. One is in the lab currently processing

## 2023-07-24 NOTE — ED Provider Notes (Signed)
 [place] EMERGENCY DEPARTMENT AT [facility] Provider Note   CSN: [id number] Arrival date & time: 07/24/23  1706     History  Chief Complaint  Patient presents with   Urinary Frequency   Altered Mental Status    Kari Coleman is a 73 y.o. female.   Urinary Frequency  Altered Mental Status Patient comes in with confusion and fever.  Reportedly has been treated for UTI recently.  Reviewing notes it appears she had an E. coli UTI on a month ago.  Reportedly was on antibiotics but not covered however.  Patient is somewhat difficult to get history from what sounds if she only took 2 doses of antibiotics and potentially the wrong ones.  Now having more fever.  Has had more confusion.    Past Medical History:  Diagnosis Date   Allergy    Arthritis    Cataract    Cervical cancer (HCC)    GERD (gastroesophageal reflux disease)    Glaucoma 2017   History of echocardiogram    Echo 9/16:  Mild LVH, EF 55-60%, no RWMA, Gr 1 DD, mild MR // Echo 1/17:  Mild LVH, EF 50-55%, Gr 1 DD, mild to mod MR, mild LAE   Hypertension    Hyperthyroidism    s/p RAI treatment   Palpitations    PONV (postoperative nausea and vomiting)    years ago  . Woke up and saw asian people    PONV (postoperative nausea and vomiting) 07/18/2021   Retinal detachment    L eye - partial blindness   Tobacco abuse    Tuberculosis    positive test as a caregiver cxr annually    Urinary incontinence     Home Medications Prior to Admission medications   Medication Sig Start Date End Date Taking? Authorizing Provider  acetaminophen  (TYLENOL ) 325 MG tablet Take 650 mg by mouth every 4 (four) hours as needed for mild pain or moderate pain.    [provider]  albuterol  (PROVENTIL  HFA) 108 (90 Base) MCG/ACT inhaler Inhale 2 puffs into the lungs every 6 (six) hours as needed for wheezing or shortness of breath. 05/31/21   Alexander Iba, PA  albuterol  (PROVENTIL ) (2.5 MG/3ML) 0.083% nebulizer  solution Take 3 mLs (2.5 mg total) by nebulization every 6 (six) hours as needed for wheezing or shortness of breath. 07/12/21   Alexander Iba, PA  amLODipine  (NORVASC ) 10 MG tablet Take 1 tablet (10 mg total) by mouth daily. 05/17/23   Alexander Iba, PA  brimonidine  (ALPHAGAN ) 0.2 % ophthalmic solution Place 1 drop into the right eye 2 (two) times daily. 10/20/22   [provider]  Cholecalciferol  (VITAMIN D -3) 25 MCG (1000 UT) CAPS Take 2,000 Units by mouth daily.    [provider]  cyanocobalamin  (VITAMIN B12) 1000 MCG tablet Take 1 tablet (1,000 mcg total) by mouth daily. 12/21/22   Vann, Jessica U, DO  dorzolamide -timolol  (COSOPT ) 22.3-6.8 MG/ML ophthalmic solution Place 1 drop into the right eye 2 (two) times daily. 11/12/20   [provider]  fluticasone -salmeterol (WIXELA INHUB) 250-50 MCG/ACT AEPB Inhale 1 puff into the lungs in the morning and at bedtime. 07/13/22   Olalere, Rickie Chars, MD  latanoprost  (XALATAN ) 0.005 % ophthalmic solution Place 1 drop into both eyes at bedtime. 11/20/14   [provider]  levothyroxine  (SYNTHROID ) 100 MCG tablet Take 1 tablet (100 mcg total) by mouth daily before breakfast. 05/23/23   Emilie Harden, MD  lisinopril  (ZESTRIL ) 40 MG tablet TAKE  1 TABLET BY MOUTH DAILY 12/29/22   Alexander Iba, PA  metoprolol  tartrate (LOPRESSOR ) 25 MG tablet Take 1 tablet (25 mg total) by mouth 2 (two) times daily. 05/23/23   Alexander Iba, PA  ondansetron  (ZOFRAN ) 4 MG tablet Take 1 tablet (4 mg total) by mouth every 6 (six) hours. 06/26/23   Denese Finn, PA-C  ondansetron  (ZOFRAN -ODT) 8 MG disintegrating tablet Take 1 tablet (8 mg total) by mouth every 8 (eight) hours as needed for nausea or vomiting. Patient taking differently: Take 8 mg by mouth every 8 (eight) hours as needed for nausea or vomiting (dissolve orally). 11/26/22   Trish Furl, MD  pantoprazole  (PROTONIX ) 40 MG tablet Take 1 tablet (40 mg total) by mouth daily.  04/11/23   Alexander Iba, PA  polyethylene glycol (MIRALAX  / GLYCOLAX ) 17 g packet Take 17 g by mouth daily. Patient taking differently: Take 17 g by mouth daily as needed for mild constipation (mix as directed). 01/03/22   Krishnan, Gokul, MD  pravastatin  (PRAVACHOL ) 10 MG tablet TAKE 1 TABLET BY MOUTH DAILY 12/29/22   Alexander Iba, PA  senna-docusate (SENOKOT-S) 8.6-50 MG tablet Take 2 tablets by mouth 2 (two) times daily. Patient taking differently: Take 2 tablets by mouth 2 (two) times daily as needed for mild constipation or moderate constipation. 01/02/22   Krishnan, Gokul, MD      Allergies    Doxycycline , Codeine, and Pollen extract    Review of Systems   Review of Systems  Genitourinary:  Positive for frequency.    Physical Exam Updated Vital Signs BP (!) 175/85 (BP Location: Left Arm)   Pulse 90   Temp 98.6 F (37 C) (Oral)   Resp 18   Ht 5' 7.5" (1.715 m)   Wt 93.2 kg   SpO2 100%   BMI 31.71 kg/m  Physical Exam Vitals and nursing note reviewed.  Cardiovascular:     Rate and Rhythm: Regular rhythm.  Pulmonary:     Breath sounds: No wheezing.  Abdominal:     Tenderness: There is abdominal tenderness.     Comments: Suprapubic tenderness.  Skin:    General: Skin is warm.  Neurological:     Mental Status: She is alert and oriented to person, place, and time.     ED Results / Procedures / Treatments   Labs (all labs ordered are listed, but only abnormal results are displayed) Labs Reviewed  RESP PANEL BY RT-PCR (RSV, FLU A&B, COVID)  [id number] - Abnormal; Notable for the following components:      Result Value   SARS Coronavirus 2 by RT PCR POSITIVE (*)    All other components within normal limits  CBC WITH DIFFERENTIAL/PLATELET - Abnormal; Notable for the following components:   HCT 46.1 (*)    All other components within normal limits  CULTURE, BLOOD (ROUTINE X 2)  CULTURE, BLOOD (ROUTINE X 2)  URINALYSIS, W/ REFLEX TO CULTURE (INFECTION SUSPECTED)   COMPREHENSIVE METABOLIC PANEL WITH GFR  I-STAT CG4 LACTIC ACID, ED  I-STAT CG4 LACTIC ACID, ED    EKG None  Radiology CT ABDOMEN PELVIS W CONTRAST Result Date: 07/24/2023 CLINICAL DATA:  Sepsis, UTI, altered mental status EXAM: CT ABDOMEN AND PELVIS WITH CONTRAST TECHNIQUE: Multidetector CT imaging of the abdomen and pelvis was performed using the standard protocol following bolus administration of intravenous contrast. RADIATION DOSE REDUCTION: This exam was performed according to the departmental dose-optimization program which includes automated exposure control, adjustment of the mA and/or kV according to patient  size and/or use of iterative reconstruction technique. CONTRAST:  OMNIPAQUE  IOHEXOL  300 MG/ML  SOLN COMPARISON:  06/26/2023 FINDINGS: Lower chest: No acute abnormality. Hepatobiliary: Hepatic steatosis. Normal gallbladder. No biliary dilation. Pancreas: Unremarkable. Spleen: Unremarkable. Adrenals/Urinary Tract: Normal adrenal glands. No urinary calculi or hydronephrosis. Bladder is unremarkable. Stomach/Bowel: Normal caliber large and small bowel. No bowel wall thickening. The appendix is normal.Stomach is within normal limits. Vascular/Lymphatic: Aortic atherosclerosis. No enlarged abdominal or pelvic lymph nodes. Reproductive: Unremarkable. Other: No free intraperitoneal fluid or air. Musculoskeletal: No acute fracture. IMPRESSION: 1. No acute abnormality in the abdomen or pelvis. 2. Hepatic steatosis. 3. Aortic Atherosclerosis (ICD10-I70.0). Electronically Signed   By: Rozell Cornet M.D.   On: 07/24/2023 20:47   DG Chest Portable 1 View Result Date: 07/24/2023 CLINICAL DATA:  Fever. EXAM: PORTABLE CHEST 1 VIEW COMPARISON:  December 18, 2022. FINDINGS: Stable cardiomediastinal silhouette. Both lungs are clear. The visualized skeletal structures are unremarkable. IMPRESSION: No active disease. Electronically Signed   By: Rosalene Colon M.D.   On: 07/24/2023 18:37     Procedures Procedures    Medications Ordered in ED Medications  meropenem  (MERREM ) 1 g in sodium chloride  0.9 % 100 mL IVPB (0 g Intravenous Stopped 07/24/23 2109)  iohexol  (OMNIPAQUE ) 300 MG/ML solution 100 mL (100 mLs Intravenous Contrast Given 07/24/23 2017)    ED Course/ Medical Decision Making/ A&P                                 Medical Decision Making Amount and/or Complexity of Data Reviewed Labs: ordered. Radiology: ordered.  Risk Prescription drug management. Decision regarding hospitalization.   Patient presenting with mental status change.  UTI had recently been treated but potentially with incorrect antibiotics.  I do not see that she got the fosfomycin that was ordered.  Reviewed previous culture.  Gave dose of meropenem  to cover UTI.  However urinalysis does not show infection.  Fevers resolved.  Now without clear source will get x-ray and flu COVID RSV testing.  Will also get CT abdomen pelvis.  Patient's COVID test is now come back positive. Patient's family Abundio Hoit is now here.  She states that she had filled the New antibiotic and the just have a dose on the 17th.    chest x-ray reassuring abdominal pelvis CT also reassuring..  With encephalopathy however I think patient would benefit from admission to the hospital.  Will discuss with hospitalist          Final Clinical Impression(s) / ED Diagnoses Final diagnoses:  COVID-19  Metabolic encephalopathy    Rx / DC Orders ED Discharge Orders     None         Mozell Arias, MD 07/24/23 2231

## 2023-07-25 DIAGNOSIS — U071 COVID-19: Secondary | ICD-10-CM | POA: Diagnosis not present

## 2023-07-25 LAB — COMPREHENSIVE METABOLIC PANEL WITH GFR
ALT: 17 U/L (ref 0–44)
AST: 17 U/L (ref 15–41)
Albumin: 3.7 g/dL (ref 3.5–5.0)
Alkaline Phosphatase: 81 U/L (ref 38–126)
Anion gap: 13 (ref 5–15)
BUN: 14 mg/dL (ref 8–23)
CO2: 24 mmol/L (ref 22–32)
Calcium: 9.6 mg/dL (ref 8.9–10.3)
Chloride: 99 mmol/L (ref 98–111)
Creatinine, Ser: 1.04 mg/dL — ABNORMAL HIGH (ref 0.44–1.00)
GFR, Estimated: 56 mL/min — ABNORMAL LOW (ref >60)
Glucose, Bld: 107 mg/dL — ABNORMAL HIGH (ref 70–99)
Potassium: 3.7 mmol/L (ref 3.5–5.1)
Sodium: 136 mmol/L (ref 135–145)
Total Bilirubin: 1.1 mg/dL (ref 0.0–1.2)
Total Protein: 7.6 g/dL (ref 6.5–8.1)

## 2023-07-25 LAB — CBC
HCT: 43.4 % (ref 36.0–46.0)
Hemoglobin: 14.4 g/dL (ref 12.0–15.0)
MCH: 29.3 pg (ref 26.0–34.0)
MCHC: 33.2 g/dL (ref 30.0–36.0)
MCV: 88.4 fL (ref 80.0–100.0)
Platelets: 211 10*3/uL (ref 150–400)
RBC: 4.91 MIL/uL (ref 3.87–5.11)
RDW: 12.4 % (ref 11.5–15.5)
WBC: 6.3 10*3/uL (ref 4.0–10.5)
nRBC: 0 % (ref 0.0–0.2)

## 2023-07-25 LAB — BLOOD GAS, VENOUS
Acid-Base Excess: 3.5 mmol/L — ABNORMAL HIGH (ref 0.0–2.0)
Bicarbonate: 27.8 mmol/L (ref 20.0–28.0)
Drawn by: 8473
O2 Saturation: 84.7 %
Patient temperature: 36.9
pCO2, Ven: 40 mmHg — ABNORMAL LOW (ref 44–60)
pH, Ven: 7.45 — ABNORMAL HIGH (ref 7.25–7.43)
pO2, Ven: 47 mmHg — ABNORMAL HIGH (ref 32–45)

## 2023-07-25 LAB — TSH: TSH: 1.006 u[IU]/mL (ref 0.350–4.500)

## 2023-07-25 LAB — VITAMIN B12: Vitamin B-12: 327 pg/mL (ref 180–914)

## 2023-07-25 LAB — PHOSPHORUS: Phosphorus: 2.9 mg/dL (ref 2.5–4.6)

## 2023-07-25 LAB — MAGNESIUM: Magnesium: 1.8 mg/dL (ref 1.7–2.4)

## 2023-07-25 LAB — AMMONIA: Ammonia: 31 umol/L (ref 9–35)

## 2023-07-25 MED ORDER — BRIMONIDINE TARTRATE 0.2 % OP SOLN
1.0000 [drp] | Freq: Every day | OPHTHALMIC | Status: DC
Start: 2023-07-25 — End: 2023-07-28
  Administered 2023-07-25 – 2023-07-26 (×2): 1 [drp] via OPHTHALMIC
  Filled 2023-07-25: qty 5

## 2023-07-25 MED ORDER — VITAMIN B-12 1000 MCG PO TABS
1000.0000 ug | ORAL_TABLET | Freq: Every day | ORAL | Status: DC
  Administered 2023-07-25 – 2023-07-27 (×3): 1000 ug via ORAL
  Filled 2023-07-25 (×3): qty 1

## 2023-07-25 MED ORDER — PRAVASTATIN SODIUM 20 MG PO TABS
10.0000 mg | ORAL_TABLET | Freq: Every day | ORAL | Status: DC
Start: 2023-07-25 — End: 2023-07-28
  Administered 2023-07-25 – 2023-07-27 (×3): 10 mg via ORAL
  Filled 2023-07-25 (×3): qty 1

## 2023-07-25 MED ORDER — ACETAMINOPHEN 650 MG RE SUPP
650.0000 mg | RECTAL | Status: DC | PRN
  Administered 2023-07-25: 650 mg via RECTAL
  Filled 2023-07-25: qty 1

## 2023-07-25 MED ORDER — VITAMIN D3 25 MCG (1000 UNIT) PO TABS
2000.0000 [IU] | ORAL_TABLET | Freq: Every day | ORAL | Status: DC
  Administered 2023-07-25 – 2023-07-27 (×3): 2000 [IU] via ORAL
  Filled 2023-07-25 (×3): qty 2

## 2023-07-25 MED ORDER — HYDRALAZINE HCL 20 MG/ML IJ SOLN
5.0000 mg | Freq: Four times a day (QID) | INTRAMUSCULAR | Status: DC | PRN
  Administered 2023-07-25 (×2): 5 mg via INTRAVENOUS
  Filled 2023-07-25 (×2): qty 1

## 2023-07-25 MED ORDER — IPRATROPIUM-ALBUTEROL 0.5-2.5 (3) MG/3ML IN SOLN: 3.0000 mL | RESPIRATORY_TRACT | Status: DC | PRN

## 2023-07-25 MED ORDER — LATANOPROST 0.005 % OP SOLN
1.0000 [drp] | Freq: Every day | OPHTHALMIC | Status: DC
  Administered 2023-07-25 – 2023-07-26 (×2): 1 [drp] via OPHTHALMIC
  Filled 2023-07-25: qty 2.5

## 2023-07-25 MED ORDER — POLYETHYLENE GLYCOL 3350 17 G PO PACK: 17.0000 g | PACK | Freq: Every day | ORAL | Status: DC | PRN

## 2023-07-25 MED ORDER — PANTOPRAZOLE SODIUM 40 MG PO TBEC
40.0000 mg | DELAYED_RELEASE_TABLET | Freq: Every day | ORAL | Status: DC
  Administered 2023-07-25 – 2023-07-27 (×3): 40 mg via ORAL
  Filled 2023-07-25 (×3): qty 1

## 2023-07-25 MED ORDER — PROCHLORPERAZINE EDISYLATE 10 MG/2ML IJ SOLN: 5.0000 mg | Freq: Four times a day (QID) | INTRAMUSCULAR | Status: DC | PRN

## 2023-07-25 MED ORDER — ACETAMINOPHEN 325 MG PO TABS: 650.0000 mg | ORAL_TABLET | Freq: Four times a day (QID) | ORAL | Status: DC | PRN

## 2023-07-25 MED ORDER — NIRMATRELVIR/RITONAVIR (PAXLOVID) TABLET (RENAL DOSING)
2.0000 | ORAL_TABLET | Freq: Two times a day (BID) | ORAL | Status: DC
  Administered 2023-07-25 – 2023-07-27 (×5): 2 via ORAL
  Filled 2023-07-25: qty 20

## 2023-07-25 NOTE — Plan of Care (Signed)
  Problem: Education: Goal: Knowledge of risk factors and measures for prevention of condition will improve Outcome: Progressing   Problem: Respiratory: Goal: Will maintain a patent airway Outcome: Progressing   Problem: Clinical Measurements: Goal: Diagnostic test results will improve Outcome: Progressing   Problem: Nutrition: Goal: Adequate nutrition will be maintained Outcome: Progressing   Problem: Elimination: Goal: Will not experience complications related to bowel motility Outcome: Progressing   Problem: Pain Managment: Goal: General experience of comfort will improve and/or be controlled Outcome: Progressing   Problem: Safety: Goal: Ability to remain free from injury will improve Outcome: Progressing

## 2023-07-25 NOTE — Care Management Obs Status (Signed)
 MEDICARE OBSERVATION STATUS NOTIFICATION   Patient Details  Name: SHAUGHNESSY GETHERS MRN: [id number] Date of Birth: [birth year]   Medicare Observation Status Notification Given:  Yes    Marty Sleet, LCSW 07/25/2023, 2:55 PM

## 2023-07-25 NOTE — Evaluation (Signed)
 Physical Therapy Evaluation Patient Details Name: Kari Coleman MRN: [id number] DOB: 04-Oct-1950 Today's Date: 07/25/2023  History of Present Illness  73 yo female admitted with COVID, acute met encephalopathy. Hx of TKA, COVID, cervical Ca, hyperthyroidism, TB, DVT, PE, essential HTN  Clinical Impression  On eval, pt required Min A for mobility. She was able to stand and take a few steps in the room with a RW. Pt was somewhat lethargic during session. She followed 1 step commands with increased time. Pt presents with general weakness, decreased activity tolerance, and impaired gait and balance. No family was present during session. May need to consider rehab placement if pt will not have sufficient supervision/assist at home. Will plan to follow pt acutely.         If plan is discharge home, recommend the following: A little help with walking and/or transfers;A little help with bathing/dressing/bathroom;Assist for transportation;Help with stairs or ramp for entrance;Assistance with cooking/housework   Can travel by private vehicle        Equipment Recommendations None recommended by PT  Recommendations for Other Services       Functional Status Assessment Patient has had a recent decline in their functional status and demonstrates the ability to make significant improvements in function in a reasonable and predictable amount of time.     Precautions / Restrictions Precautions Precautions: Fall Restrictions Weight Bearing Restrictions Per Provider Order: No      Mobility  Bed Mobility Overal bed mobility: Needs Assistance Bed Mobility: Supine to Sit, Sit to Supine     Supine to sit: Min assist     General bed mobility comments: Increased time. Some difficulty with balance while getting to EOB. Cues for safety.    Transfers Overall transfer level: Needs assistance Equipment used: Rolling walker (2 wheels) Transfers: Sit to/from Stand Sit to Stand: From elevated surface,  Min assist           General transfer comment: Increased time and 2 attempts to fully stand and remain standing. Cues for safety, technique, hand placement. Assist to power up, stabilize, control descent.    Ambulation/Gait Ambulation/Gait assistance: Min assist Gait Distance (Feet): 3 Feet Assistive device: Rolling walker (2 wheels) Gait Pattern/deviations: Step-through pattern, Decreased stride length     Pre-gait activities: Pre-gait marching x 10 reps with RW General Gait Details: Pt took lateral steps towards HOB with RW. Then she was able to take 3 steps forwards then 3 steps backwards. Cues for safety. Assist to stabilize pt and manage RW. Poor foot clearance.  Stairs            Wheelchair Mobility     Tilt Bed    Modified Rankin (Stroke Patients Only)       Balance Overall balance assessment: Needs assistance, History of Falls         Standing balance support: During functional activity, Bilateral upper extremity supported, Reliant on assistive device for balance Standing balance-Leahy Scale: Poor                               Pertinent Vitals/Pain Pain Assessment Pain Assessment: No/denies pain    Home Living Family/patient expects to be discharged to:: Private residence   Available Help at Discharge: Family Type of Home: Apartment Home Access: Level entry       Home Layout: One level Home Equipment: Agricultural consultant (2 wheels);Cane - single point;BSC/3in1      Prior Function Prior  Level of Function : History of Falls (last six months)             Mobility Comments: uses RW vs cane ADLs Comments: family assists as needed     Extremity/Trunk Assessment   Upper Extremity Assessment Upper Extremity Assessment: Defer to OT evaluation    Lower Extremity Assessment Lower Extremity Assessment: Generalized weakness    Cervical / Trunk Assessment Cervical / Trunk Assessment: Normal  Communication    Communication Communication: No apparent difficulties    Cognition Arousal: Lethargic Behavior During Therapy: WFL for tasks assessed/performed   PT - Cognitive impairments: No family/caregiver present to determine baseline                       PT - Cognition Comments: very slow responses Following commands: Impaired Following commands impaired: Follows one step commands with increased time     Cueing Cueing Techniques: Verbal cues     General Comments      Exercises     Assessment/Plan    PT Assessment Patient needs continued PT services  PT Problem List Decreased strength;Decreased mobility;Decreased balance;Decreased activity tolerance;Decreased knowledge of use of DME;Decreased cognition;Decreased safety awareness       PT Treatment Interventions DME instruction;Gait training;Functional mobility training;Therapeutic activities;Therapeutic exercise;Balance training;Patient/family education    PT Goals (Current goals can be found in the Care Plan section)  Acute Rehab PT Goals Patient Stated Goal: none stated PT Goal Formulation: With patient Time For Goal Achievement: 08/08/23 Potential to Achieve Goals: Good    Frequency Min 3X/week     Co-evaluation               AM-PAC PT "6 Clicks" Mobility  Outcome Measure Help needed turning from your back to your side while in a flat bed without using bedrails?: A Little Help needed moving from lying on your back to sitting on the side of a flat bed without using bedrails?: A Little Help needed moving to and from a bed to a chair (including a wheelchair)?: A Little Help needed standing up from a chair using your arms (e.g., wheelchair or bedside chair)?: A Little Help needed to walk in hospital room?: A Little Help needed climbing 3-5 steps with a railing? : A Lot 6 Click Score: 17    End of Session Equipment Utilized During Treatment: Gait belt Activity Tolerance: Patient tolerated treatment  well;Patient limited by fatigue Patient left: in bed;with call bell/phone within reach;with bed alarm set Nurse Communication: Mobility status PT Visit Diagnosis: Muscle weakness (generalized) (M62.81);Difficulty in walking, not elsewhere classified (R26.2);History of falling (Z91.81)    Time: 1026-1050 PT Time Calculation (min) (ACUTE ONLY): 24 min   Charges:   PT Evaluation $PT Eval Low Complexity: 1 Low PT Treatments $Gait Training: 8-22 mins PT General Charges $$ ACUTE PT VISIT: 1 Visit            Tanda Falter, PT Acute Rehabilitation  Office: [phone]

## 2023-07-25 NOTE — Evaluation (Signed)
 Occupational Therapy Evaluation Patient Details Name: Kari Coleman MRN: [id number] DOB: 09-06-1950 Today's Date: 07/25/2023   History of Present Illness   73 yo female admitted with COVID, acute met encephalopathy. Hx of TKA, COVID, cervical Ca, hyperthyroidism, TB, DVT, PE, essential HTN     Clinical Impressions PTA, reports using a RW and has had some family assistance at home prior to admission however lethargy and mild cognitive deficits contribute to limited plof. Patient currently presents with with generalized muscle weakness, limited activity tolerance and balance, higher level cognitive deficits impacting BADL's and functional mobility. Patient will require continued Acute level skilled OT while in hospital setting to maximize safety and strength. Patient will benefit from continued inpatient follow up therapy, <3 hours/day.       If plan is discharge home, recommend the following:   A lot of help with walking and/or transfers;A lot of help with bathing/dressing/bathroom;Assistance with cooking/housework;Assistance with feeding;Direct supervision/assist for medications management;Direct supervision/assist for financial management;Assist for transportation;Help with stairs or ramp for entrance;Supervision due to cognitive status     Functional Status Assessment   Patient has had a recent decline in their functional status and demonstrates the ability to make significant improvements in function in a reasonable and predictable amount of time.     Equipment Recommendations   None recommended by OT      Precautions/Restrictions   Precautions Precautions: Fall Restrictions Weight Bearing Restrictions Per Provider Order: No     Mobility Bed Mobility Overal bed mobility: Needs Assistance Bed Mobility: Supine to Sit, Sit to Supine     Supine to sit: Min assist Sit to supine: Min assist   General bed mobility comments: cues for steps    Transfers Overall  transfer level: Needs assistance Equipment used: Rolling walker (2 wheels) Transfers: Sit to/from Stand, Bed to chair/wheelchair/BSC Sit to Stand: From elevated surface, Mod assist Stand pivot transfers: Mod assist, From elevated surface         General transfer comment: cues and rests between steps of transfer due to fatigue and weakness      Balance Overall balance assessment: Needs assistance, History of Falls Sitting-balance support: Feet supported, Single extremity supported Sitting balance-Leahy Scale: Fair     Standing balance support: Bilateral upper extremity supported, During functional activity, Reliant on assistive device for balance Standing balance-Leahy Scale: Poor                             ADL either performed or assessed with clinical judgement   ADL Overall ADL's : Needs assistance/impaired Eating/Feeding: Set up;Bed level   Grooming: Wash/dry hands;Wash/dry face;Sitting;Cueing for sequencing;Oral care;Minimal assistance   Upper Body Bathing: Minimal assistance;Cueing for sequencing;Sitting   Lower Body Bathing: Maximal assistance;Sitting/lateral leans;Sit to/from stand   Upper Body Dressing : Moderate assistance;Sitting   Lower Body Dressing: Maximal assistance;Sitting/lateral leans;Sit to/from stand   Toilet Transfer: Moderate assistance;BSC/3in1;Rolling walker (2 wheels)   Toileting- Clothing Manipulation and Hygiene: Moderate assistance       Functional mobility during ADLs: Moderate assistance;Rolling walker (2 wheels) General ADL Comments: fatigues easily needing multiple rests     Vision Baseline Vision/History: 1 Wears glasses;0 No visual deficits Ability to See in Adequate Light: 0 Adequate Patient Visual Report: No change from baseline Vision Assessment?: No apparent visual deficits;Wears glasses for reading     Perception Perception: Within Functional Limits       Praxis Praxis: [facility]       Pertinent  Vitals/Pain  Pain Assessment Pain Assessment: No/denies pain     Extremity/Trunk Assessment Upper Extremity Assessment Upper Extremity Assessment: Right hand dominant;Generalized weakness   Lower Extremity Assessment Lower Extremity Assessment: Generalized weakness   Cervical / Trunk Assessment Cervical / Trunk Assessment: Normal   Communication Communication Communication: No apparent difficulties   Cognition Arousal: Lethargic Behavior During Therapy: WFL for tasks assessed/performed Cognition: Cognition impaired   Orientation impairments: Time Awareness: Online awareness impaired Memory impairment (select all impairments): Short-term memory Attention impairment (select first level of impairment): Sustained attention Executive functioning impairment (select all impairments): Initiation, Organization, Sequencing, Reasoning, Problem solving OT - Cognition Comments: slow processing                 Following commands: Impaired Following commands impaired: Follows one step commands with increased time, Follows one step commands inconsistently     Cueing  General Comments   Cueing Techniques: Verbal cues  no SOB or skin issues noted   Exercises     Shoulder Instructions      Home Living Family/patient expects to be discharged to:: Private residence Living Arrangements: Children Available Help at Discharge: Family Type of Home: Apartment Home Access: Level entry     Home Layout: One level     Bathroom Shower/Tub: IT trainer: Standard Bathroom Accessibility: Yes How Accessible: Accessible via walker Home Equipment: Rolling Walker (2 wheels);Cane - single point;BSC/3in1;Shower seat          Prior Functioning/Environment Prior Level of Function : History of Falls (last six months)             Mobility Comments: uses RW vs cane ADLs Comments: family assists as needed    OT Problem List: Decreased strength;Decreased activity  tolerance;Impaired balance (sitting and/or standing);Decreased cognition;Decreased knowledge of use of DME or AE;Decreased knowledge of precautions;Decreased safety awareness;Cardiopulmonary status limiting activity;Obesity   OT Treatment/Interventions: Self-care/ADL training;Therapeutic exercise;Neuromuscular education;Energy conservation;DME and/or AE instruction;Therapeutic activities;Cognitive remediation/compensation;Patient/family education;Balance training      OT Goals(Current goals can be found in the care plan section)   Acute Rehab OT Goals Patient Stated Goal: to get stronger OT Goal Formulation: With patient Time For Goal Achievement: 08/08/23 Potential to Achieve Goals: Good ADL Goals Pt Will Perform Lower Body Bathing: with contact guard assist;sitting/lateral leans;sit to/from stand;with adaptive equipment Pt Will Perform Lower Body Dressing: with contact guard assist;with adaptive equipment;sitting/lateral leans;sit to/from stand Pt Will Transfer to Toilet: with contact guard assist;ambulating;bedside commode;grab bars Pt/caregiver will Perform Home Exercise Program: Increased strength;Independently;With written HEP provided   OT Frequency:  Min 2X/week       AM-PAC OT "6 Clicks" Daily Activity     Outcome Measure Help from another person eating meals?: A Little Help from another person taking care of personal grooming?: A Little Help from another person toileting, which includes using toliet, bedpan, or urinal?: A Lot Help from another person bathing (including washing, rinsing, drying)?: A Lot Help from another person to put on and taking off regular upper body clothing?: A Little Help from another person to put on and taking off regular lower body clothing?: A Lot 6 Click Score: 15   End of Session Equipment Utilized During Treatment: Gait belt;Rolling walker (2 wheels) Nurse Communication: Mobility status  Activity Tolerance: Patient limited by fatigue;Patient  limited by lethargy Patient left: in bed;with call bell/phone within reach;with bed alarm set  OT Visit Diagnosis: Unsteadiness on feet (R26.81);History of falling (Z91.81);Muscle weakness (generalized) (M62.81);Cognitive communication deficit (R41.841)  Time: 1550-1611 OT Time Calculation (min): 21 min Charges:  OT General Charges $OT Visit: 1 Visit OT Evaluation $OT Eval Moderate Complexity: 1 Mod  Aliciana Ricciardi OT/L Acute Rehabilitation Department  [phone]  07/25/2023, 4:50 PM

## 2023-07-25 NOTE — Progress Notes (Signed)
 Returned call to daughter, Maida Sciara. She had questions about POC.

## 2023-07-25 NOTE — Plan of Care (Signed)
  Problem: Respiratory: Goal: Will maintain a patent airway Outcome: Progressing   Problem: Safety: Goal: Ability to remain free from injury will improve Outcome: Progressing   Problem: Nutrition: Goal: Adequate nutrition will be maintained Outcome: Progressing

## 2023-07-25 NOTE — ED Notes (Signed)
 Spoke with provider and gave the ok to place patient on purewick due to AMS and un ability to help turn good for frequent changes.

## 2023-07-25 NOTE — Hospital Course (Addendum)
 PROGRESS NOTE Kari Coleman  ZOX:[id number] DOB: Aug 20, 1950 DOA: 07/24/2023 PCP: Alexander Iba, PA  Brief Narrative/Hospital Course: 78 yOF W/ htn,chronic HFpEF, hypothyroidism, glaucoma, history of tuberculosis, presented 4/22> to the ED due to altered mental status for the past 3 days and w/ associated with increased urinary frequency, recently treated for ESBL E. coli UTI completed antibiotics 4/21 In the ED: Found to be febrile altered mental status UA negative for pyuria, COVID-19 by PCR positive. Cxr>no active disease. CT abdomen pelvis>> no acute abnormality in the abdomen or pelvis, hepatic steatosis.  Aortic atherosclerosis.   S/p 1 dose of Merrem   2/2 ESBL hx  and admitted for further management  Subjective: Seen this am Having low-grade temperature, vitals otherwise stable on room air BP stable Labs this morning stable CBC CMP She is aaox3  Assessment and plan:  COVID-19 viral infection: Stable respiratory wise no hypoxia no active disease in the chest x-ray.  Given her risk factors and age continue Paxlovid  x 5 days, airborned precautions,supportive care.  Still febrile this morning ensure remains afebrile before discharge   Acute metabolic encephalopathy suspect secondary to COVID-19 viral infection: No other obvious infection, chest x-ray UA unremarkable, suspect encephalopathy due to COVID infection.  Continue fall precautions supportive care delirium precaution. Monitor neurochecks - now aaox3. Fu VBG for co2 check nh3, tsh. Strict n.p.o. until more alert.   Hypothyroidism Cont levothyroxine    History of tuberculosis per medical records No active disease on chest x-ray   Generalized weakness/deconditioning Cont fall precaution, PT OT evaluation   Class I Obesity w/ Body mass index is 33.17 kg/m.: Will benefit with PCP follow-up, weight loss,healthy lifestyle and outpatient sleep eval if not done.  DVT prophylaxis: enoxaparin  (LOVENOX ) injection 40 mg Start:  07/25/23 1000 Code Status:   Code Status: Full Code Family Communication: plan of care discussed with patient at bedside. Patient status is: Remains hospitalized because of severity of illness Level of care: Telemetry   Dispo: The patient is from: home alone            Anticipated disposition: TBD PTOT to see Objective: Vitals last 24 hrs: Vitals:   07/25/23 0450 07/25/23 0453 07/25/23 0838 07/25/23 0927  BP:  (!) 156/77  (!) 151/83  Pulse:  67  73  Resp:  18    Temp:  99.5 F (37.5 C)  98.4 F (36.9 C)  TempSrc:  Oral    SpO2:  97% 98% 99%  Weight: 97.5 kg     Height:        Physical Examination: General exam:aaox3 older than stated age HEENT:Oral mucosa moist, Ear/Nose WNL grossly Respiratory system: Bilaterally diminished BS, no use of accessory muscle Cardiovascular system: S1 & S2 +. Gastrointestinal system: Abdomen soft, NT,ND,BS+ Nervous System: aaox3 non focal Extremities: LE edema neg, moving arms, warm legs Skin: No rashes,warm. MSK: Normal muscle bulk/tone.   Data Reviewed: I have personally reviewed following labs and imaging studies ( see epic result tab) CBC: Recent Labs  Lab 07/24/23 1851 07/25/23 0556  WBC 7.2 6.3  NEUTROABS 5.3  --   HGB 14.8 14.4  HCT 46.1* 43.4  MCV 91.8 88.4  PLT 244 211   CMP: Recent Labs  Lab 07/25/23 0556  NA 136  K 3.7  CL 99  CO2 24  GLUCOSE 107*  BUN 14  CREATININE 1.04*  CALCIUM 9.6  MG 1.8  PHOS 2.9   GFR: Estimated Creatinine Clearance: 57.5 mL/min (A) (by C-G formula based on SCr of  1.04 mg/dL (H)). Recent Labs  Lab 07/25/23 0556  AST 17  ALT 17  ALKPHOS 81  BILITOT 1.1  PROT 7.6  ALBUMIN 3.7   No results for input(s): "LIPASE", "AMYLASE" in the last 168 hours. No results for input(s): "AMMONIA" in the last 168 hours. Coagulation Profile: No results for input(s): "INR", "PROTIME" in the last 168 hours.  Medications reviewed:  Scheduled Meds:  enoxaparin  (LOVENOX ) injection  40 mg Subcutaneous  Q24H   levothyroxine   100 mcg Oral QAC breakfast   metoprolol  tartrate  25 mg Oral BID   multivitamin with minerals  1 tablet Oral Daily   nirmatrelvir /ritonavir  (renal dosing)  2 tablet Oral BID   Continuous Infusions:  lactated ringers  50 mL/hr at 07/24/23 2352    Total time spent in review of labs and imaging, patient evaluation, formulation of plan, documentation and communication with patient/family: 35 minutes  Lesa Rape, MD [facility] 07/25/2023, 10:11 AM

## 2023-07-25 NOTE — ED Notes (Signed)
 ED TO INPATIENT HANDOFF REPORT  ED Nurse Name and Phone #: Sherley Distad EMTP  S Name/Age/Gender Elta Halter 73 y.o. female Room/Bed: WA03/WA03  Code Status   Code Status: Full Code  Home/SNF/Other Home Patient oriented to: self Is this baseline? No   Triage Complete: Triage complete  Chief Complaint COVID-19 [U07.1]  Triage Note Urinary frequency for the last 3 days, confusion started this AM. Pt just finished antibiotics yesterday for UTI. Pt oriented to self, disoriented to time and situation.    Allergies Allergies  Allergen Reactions   Doxycycline  Nausea Only   Codeine Nausea And Vomiting   Pollen Extract Itching and Other (See Comments)    Congestion, itchy eyes, runny nose    Level of Care/Admitting Diagnosis ED Disposition     ED Disposition  Admit   Condition  --   Comment  Hospital Area: [facility] [[id number]]  Level of Care: Telemetry [5]  Admit to tele based on following criteria: Monitor for Ischemic changes  May place patient in observation at [facility] or [facility] if equivalent level of care is available:: Yes  Covid Evaluation: Asymptomatic - no recent exposure (last 10 days) testing not required  Diagnosis: COVID-19 [[id number]]  Admitting Physician: Bary Boss [[id number]]  Attending Physician: Bary Boss [[id number]]          B Medical/Surgery History Past Medical History:  Diagnosis Date   Allergy    Arthritis    Cataract    Cervical cancer (HCC)    GERD (gastroesophageal reflux disease)    Glaucoma 2017   History of echocardiogram    Echo 9/16:  Mild LVH, EF 55-60%, no RWMA, Gr 1 DD, mild MR // Echo 1/17:  Mild LVH, EF 50-55%, Gr 1 DD, mild to mod MR, mild LAE   Hypertension    Hyperthyroidism    s/p RAI treatment   Palpitations    PONV (postoperative nausea and vomiting)    years ago  . Woke up and saw asian people    PONV (postoperative nausea and vomiting) 07/18/2021   Retinal detachment    L  eye - partial blindness   Tobacco abuse    Tuberculosis    positive test as a caregiver cxr annually    Urinary incontinence    Past Surgical History:  Procedure Laterality Date   ABDOMINAL HYSTERECTOMY  1984   bladder surgey for incontinence     EYE SURGERY     REPAIR OF COMPLEX TRACTION RETINAL DETACHMENT     thyroid  radiation     TOTAL ABDOMINAL HYSTERECTOMY W/ BILATERAL SALPINGOOPHORECTOMY     TOTAL KNEE ARTHROPLASTY Right 04/22/2019   Procedure: RIGHT TOTAL KNEE ARTHROPLASTY;  Surgeon: Dayne Even, MD;  Location: [facility];  Service: Orthopedics;  Laterality: Right;  PENDING PRE-OP FOR SDDC APPROVAL     A IV Location/Drains/Wounds Patient Lines/Drains/Airways Status     Active Line/Drains/Airways     Name Placement date Placement time Site Days   Peripheral IV 07/24/23 20 G 1" Posterior;Right Hand 07/24/23  1901  Hand  1            Intake/Output Last 24 hours  Intake/Output Summary (Last 24 hours) at 07/25/2023 0028 Last data filed at 07/24/2023 2109 Gross per 24 hour  Intake 100 ml  Output --  Net 100 ml    Labs/Imaging Results for orders placed or performed during the hospital encounter of 07/24/23 (from the past 48 hours)  Urinalysis, w/ Reflex to  Culture (Infection Suspected) -Urine, Catheterized     Status: None   Collection Time: 07/24/23  6:43 PM  Result Value Ref Range   Specimen Source URINE, CATHETERIZED    Color, Urine YELLOW YELLOW   APPearance CLEAR CLEAR   Specific Gravity, Urine 1.016 1.005 - 1.030   pH 6.0 5.0 - 8.0   Glucose, UA NEGATIVE NEGATIVE mg/dL   Hgb urine dipstick NEGATIVE NEGATIVE   Bilirubin Urine NEGATIVE NEGATIVE   Ketones, ur NEGATIVE NEGATIVE mg/dL   Protein, ur NEGATIVE NEGATIVE mg/dL   Nitrite NEGATIVE NEGATIVE   Leukocytes,Ua NEGATIVE NEGATIVE   RBC / HPF 0-5 0 - 5 RBC/hpf   WBC, UA 0-5 0 - 5 WBC/hpf    Comment:        Reflex urine culture not performed if WBC <=10, OR if Squamous epithelial cells >5. If Squamous  epithelial cells >5 suggest recollection.    Bacteria, UA NONE SEEN NONE SEEN   Squamous Epithelial / HPF 0-5 0 - 5 /HPF   Mucus PRESENT     Comment: Performed at [facility], [address], [place], [place] [address]  CBC with Differential     Status: Abnormal   Collection Time: 07/24/23  6:51 PM  Result Value Ref Range   WBC 7.2 4.0 - 10.5 K/uL   RBC 5.02 3.87 - 5.11 MIL/uL   Hemoglobin 14.8 12.0 - 15.0 g/dL   HCT 60.4 (H) 54.0 - 98.1 %   MCV 91.8 80.0 - 100.0 fL   MCH 29.5 26.0 - 34.0 pg   MCHC 32.1 30.0 - 36.0 g/dL   RDW 19.1 47.8 - 29.5 %   Platelets 244 150 - 400 K/uL   nRBC 0.0 0.0 - 0.2 %   Neutrophils Relative % 73 %   Neutro Abs 5.3 1.7 - 7.7 K/uL   Lymphocytes Relative 19 %   Lymphs Abs 1.3 0.7 - 4.0 K/uL   Monocytes Relative 5 %   Monocytes Absolute 0.4 0.1 - 1.0 K/uL   Eosinophils Relative 2 %   Eosinophils Absolute 0.1 0.0 - 0.5 K/uL   Basophils Relative 1 %   Basophils Absolute 0.1 0.0 - 0.1 K/uL   Immature Granulocytes 0 %   Abs Immature Granulocytes 0.02 0.00 - 0.07 K/uL    Comment: Performed at [facility], [address], [place], [place] [address]  Resp panel by RT-PCR (RSV, Flu A&B, Covid) Anterior Nasal Swab     Status: Abnormal   Collection Time: 07/24/23  7:40 PM   Specimen: Anterior Nasal Swab  Result Value Ref Range   SARS Coronavirus 2 by RT PCR POSITIVE (A) NEGATIVE    Comment: (NOTE) SARS-CoV-2 target nucleic acids are DETECTED.  The SARS-CoV-2 RNA is generally detectable in upper respiratory specimens during the acute phase of infection. Positive results are indicative of the presence of the identified virus, but do not rule out bacterial infection or co-infection with other pathogens not detected by the test. Clinical correlation with patient history and other diagnostic information is necessary to determine patient infection status. The expected result is Negative.  Fact Sheet for  Patients: BloggerCourse.com  Fact Sheet for Healthcare Providers: SeriousBroker.it  This test is not yet approved or cleared by the [place]  FDA and  has been authorized for detection and/or diagnosis of SARS-CoV-2 by FDA under an Emergency Use Authorization (EUA).  This EUA will remain in effect (meaning this test can be used) for the duration of  the  COVID-19 declaration under Section 564(b)(1) of the A ct, 21 U.S.C. section 360bbb-3(b)(1), unless the authorization is terminated or revoked sooner.     Influenza A by PCR NEGATIVE NEGATIVE   Influenza B by PCR NEGATIVE NEGATIVE    Comment: (NOTE) The Xpert Xpress SARS-CoV-2/FLU/RSV plus assay is intended as an aid in the diagnosis of influenza from Nasopharyngeal swab specimens and should not be used as a sole basis for treatment. Nasal washings and aspirates are unacceptable for Xpert Xpress SARS-CoV-2/FLU/RSV testing.  Fact Sheet for Patients: BloggerCourse.com  Fact Sheet for Healthcare Providers: SeriousBroker.it  This test is not yet approved or cleared by the [place]  FDA and has been authorized for detection and/or diagnosis of SARS-CoV-2 by FDA under an Emergency Use Authorization (EUA). This EUA will remain in effect (meaning this test can be used) for the duration of the COVID-19 declaration under Section 564(b)(1) of the Act, 21 U.S.C. section 360bbb-3(b)(1), unless the authorization is terminated or revoked.     Resp Syncytial Virus by PCR NEGATIVE NEGATIVE    Comment: (NOTE) Fact Sheet for Patients: BloggerCourse.com  Fact Sheet for Healthcare Providers: SeriousBroker.it  This test is not yet approved or cleared by the [place]  FDA and has been authorized for detection and/or diagnosis of SARS-CoV-2 by FDA under an Emergency Use Authorization  (EUA). This EUA will remain in effect (meaning this test can be used) for the duration of the COVID-19 declaration under Section 564(b)(1) of the Act, 21 U.S.C. section 360bbb-3(b)(1), unless the authorization is terminated or revoked.  Performed at [facility], [address] [place], [place] [address]   I-Stat Lactic Acid     Status: None   Collection Time: 07/24/23  7:53 PM  Result Value Ref Range   Lactic Acid, Venous 1.7 0.5 - 1.9 mmol/L   CT ABDOMEN PELVIS W CONTRAST Result Date: 07/24/2023 CLINICAL DATA:  Sepsis, UTI, altered mental status EXAM: CT ABDOMEN AND PELVIS WITH CONTRAST TECHNIQUE: Multidetector CT imaging of the abdomen and pelvis was performed using the standard protocol following bolus administration of intravenous contrast. RADIATION DOSE REDUCTION: This exam was performed according to the departmental dose-optimization program which includes automated exposure control, adjustment of the mA and/or kV according to patient size and/or use of iterative reconstruction technique. CONTRAST:  OMNIPAQUE  IOHEXOL  300 MG/ML  SOLN COMPARISON:  06/26/2023 FINDINGS: Lower chest: No acute abnormality. Hepatobiliary: Hepatic steatosis. Normal gallbladder. No biliary dilation. Pancreas: Unremarkable. Spleen: Unremarkable. Adrenals/Urinary Tract: Normal adrenal glands. No urinary calculi or hydronephrosis. Bladder is unremarkable. Stomach/Bowel: Normal caliber large and small bowel. No bowel wall thickening. The appendix is normal.Stomach is within normal limits. Vascular/Lymphatic: Aortic atherosclerosis. No enlarged abdominal or pelvic lymph nodes. Reproductive: Unremarkable. Other: No free intraperitoneal fluid or air. Musculoskeletal: No acute fracture. IMPRESSION: 1. No acute abnormality in the abdomen or pelvis. 2. Hepatic steatosis. 3. Aortic Atherosclerosis (ICD10-I70.0). Electronically Signed   By: Rozell Cornet M.D.   On: 07/24/2023 20:47   DG Chest Portable  1 View Result Date: 07/24/2023 CLINICAL DATA:  Fever. EXAM: PORTABLE CHEST 1 VIEW COMPARISON:  December 18, 2022. FINDINGS: Stable cardiomediastinal silhouette. Both lungs are clear. The visualized skeletal structures are unremarkable. IMPRESSION: No active disease. Electronically Signed   By: Rosalene Colon M.D.   On: 07/24/2023 18:37    Pending Labs Unresulted Labs (From admission, onward)     Start     Ordered   07/31/23 0500  Creatinine, serum  (enoxaparin  (LOVENOX )    CrCl >/=  30 ml/min)  Weekly,   R     Comments: while on enoxaparin  therapy    07/24/23 2333   07/25/23 0500  CBC  Tomorrow morning,   R        07/24/23 2336   07/25/23 0500  Magnesium  Tomorrow morning,   R        07/24/23 2336   07/25/23 0500  Phosphorus  Tomorrow morning,   R        07/24/23 2336   07/25/23 0500  Comprehensive metabolic panel with GFR  Tomorrow morning,   R        07/24/23 2337   07/24/23 1803  Culture, blood (routine x 2)  BLOOD CULTURE X 2,   R (with STAT occurrences)      07/24/23 1802            Vitals/Pain Today's Vitals   07/24/23 1714 07/24/23 1722 07/24/23 1927 07/24/23 2230  BP: (!) 180/97  (!) 175/85 (!) 159/97  Pulse: 86  90   Resp: 20  18   Temp: (!) 101.5 F (38.6 C)  98.6 F (37 C)   TempSrc: Oral  Oral   SpO2: 95%  100%   Weight:  205 lb 7.5 oz (93.2 kg)    Height:  5' 7.5" (1.715 m)    PainSc:  0-No pain      Isolation Precautions Airborne and Contact precautions  Medications Medications  enoxaparin  (LOVENOX ) injection 40 mg (has no administration in time range)  ipratropium-albuterol  (DUONEB) 0.5-2.5 (3) MG/3ML nebulizer solution 3 mL (3 mLs Nebulization Given 07/24/23 2348)  nirmatrelvir /ritonavir  (PAXLOVID ) 3 tablet (has no administration in time range)  multivitamin with minerals tablet 1 tablet (has no administration in time range)  lactated ringers  infusion ( Intravenous New Bag/Given 07/24/23 2352)  levothyroxine  (SYNTHROID ) tablet 100 mcg (has no  administration in time range)  metoprolol  tartrate (LOPRESSOR ) tablet 25 mg (has no administration in time range)  meropenem  (MERREM ) 1 g in sodium chloride  0.9 % 100 mL IVPB (0 g Intravenous Stopped 07/24/23 2109)  iohexol  (OMNIPAQUE ) 300 MG/ML solution 100 mL (100 mLs Intravenous Contrast Given 07/24/23 2017)    Mobility Patient is alerted and unable to assist with rolling. Patient is not ok to walk at this time. Normally walking at home      Focused Assessments Patient came in complaining of urinary frequency with AMS. Patient is COVID+ with precautions. Patient at this time is only oriented to self. Patient is very slow to respond with eye opening and moving. Patient is currently on purewick with provider approval based on patients status.    R Recommendations: See Admitting Provider Note  Report given to:   Additional Notes:

## 2023-07-25 NOTE — TOC Initial Note (Signed)
 Transition of Care [facility]) - Initial/Assessment Note    Patient Details  Name: Kari Coleman MRN: [id number] Date of Birth: 1950/04/09  [facility]) CM/SW Contact:    Kari Sleet, LCSW Phone Number: 07/25/2023, 2:53 PM  Clinical Narrative:                 Pt from home with daughters. Pt uses RW at baseline. Pt disoriented x 3. Spoke with pt's daughter Kari Coleman via t/c to discuss disposition. Pt's daughter agreeable to SNF placement for her mother and shares that pt has been to SNF at [facility] in the past. Pt is currently COVID + as of 07/24/2023. Pt may have to complete isolation period for COVID prior to transferring to SNF.  Referrals have been faxed out for SNF placement and currently awaiting bed offers.    Expected Discharge Plan: Skilled Nursing Facility Barriers to Discharge: Continued Medical Work up, English as a second language teacher, SNF Pending bed offer   Patient Goals and CMS Choice Patient states their goals for this hospitalization and ongoing recovery are:: To go to SNF CMS Medicare.gov Compare Post Acute Care list provided to:: Patient Represenative (must comment) (pt's adult children) Choice offered to / list presented to : Adult Children      Expected Discharge Plan and Services In-house Referral: Clinical Social Work   Post Acute Care Choice: Skilled Nursing Facility Living arrangements for the past 2 months: Single Family Home                                      Prior Living Arrangements/Services Living arrangements for the past 2 months: Single Family Home Lives with:: Adult Children (pt's daughter, Kari Coleman) Patient language and need for interpreter reviewed:: Yes        Need for Family Participation in Patient Care: Yes (Comment) Care giver support system in place?: No (comment)   Criminal Activity/Legal Involvement Pertinent to Current Situation/Hospitalization: No - Comment as needed  Activities of Daily Living    ADL Screening (condition at time of admission) Independently performs ADLs?: Yes (appropriate for developmental age) Is the patient deaf or have difficulty hearing?: No Does the patient have difficulty seeing, even when wearing glasses/contacts?: No Does the patient have difficulty concentrating, remembering, or making decisions?: No  Permission Sought/Granted Permission sought to share information with : Facility Industrial/product designer granted to share information with : Yes, Verbal Permission Granted     Permission granted to share info w AGENCY: SNF        Emotional Assessment Appearance:: Other (Comment Required (unable to assess) Attitude/Demeanor/Rapport: Unable to Assess Affect (typically observed): Unable to Assess Orientation: : Oriented to Self Alcohol / Substance Use: Never Used Psych Involvement: No (comment)  Admission diagnosis:  Metabolic encephalopathy [G93.41] COVID-19 [U07.1] Patient Active Problem List   Diagnosis Date Noted   COVID-19 07/24/2023   UTI (urinary tract infection) 12/19/2022   Acute encephalopathy 12/19/2022   Herpes zoster without complication 08/31/2022   Chronic deep vein thrombosis (DVT) of left popliteal vein (HCC) 12/28/2021   Acute pulmonary embolism (HCC) 12/26/2021   Urinary incontinence 07/18/2021   Tuberculosis 07/18/2021   Tobacco abuse 07/18/2021   Retinal detachment 07/18/2021   PONV (postoperative nausea and vomiting) 07/18/2021   Palpitations 07/18/2021   Hyperthyroidism 07/18/2021   History of echocardiogram 07/18/2021   GERD (gastroesophageal reflux disease) 07/18/2021   Cervical cancer (HCC) 07/18/2021  Cataract 07/18/2021   Arthritis 07/18/2021   Allergy 07/18/2021   Chronic angle-closure glaucoma of both eyes, severe stage 03/02/2021   Vitamin D  deficiency 01/22/2020   Multinodular goiter 01/22/2020   Fatigue 10/21/2019   Primary osteoarthritis of right knee 04/22/2019   Abnormal EKG 04/14/2019    Hypothyroidism 04/11/2019   Elevated glucose 04/08/2019   Chest discomfort 04/25/2018   Ex-smoker 04/25/2018   Primary localized osteoarthritis of right knee 01/04/2016   Glaucoma 2017   Essential hypertension    PCP:  Kari Iba, PA Pharmacy:   [facility] [address] [place] - [address] [place] [address] Phone: [phone] Fax: [phone]  [place] [facility] [address] [place] [address] Phone: [phone] Fax: [phone]  [facility] - [place], [place] - [address] [place] [address] Phone: [phone] Fax: [phone]     Social Drivers of Health (SDOH) Social History: SDOH Screenings   Food Insecurity: No Food Insecurity (07/25/2023)  Housing: Low Risk  (07/25/2023)  Transportation Needs: No Transportation Needs (07/25/2023)  Utilities: Not At Risk (07/25/2023)  Alcohol Screen: Low Risk  (12/12/2021)  Depression (PHQ2-9): Low Risk  (04/11/2023)  Financial Resource Strain: Low Risk  (12/12/2021)  Physical Activity: Inactive (12/12/2021)  Social Connections: Moderately Isolated (07/25/2023)  Stress: No Stress Concern Present (12/12/2021)  Tobacco Use: Medium Risk (07/24/2023)   SDOH Interventions: Food Insecurity Interventions: Intervention Not Indicated Housing Interventions: Intervention Not Indicated Transportation Interventions: Intervention Not Indicated Utilities Interventions: Intervention Not Indicated Social Connections Interventions: Intervention Not Indicated   Readmission Risk Interventions    12/21/2022    1:40 PM  Readmission Risk Prevention Plan  Transportation Screening Complete  PCP or Specialist Appt within 5-7 Days Complete  Home Care Screening Complete  Medication Review (RN CM) Complete

## 2023-07-25 NOTE — Progress Notes (Signed)
 PROGRESS NOTE Kari Coleman  UJW:[id number] DOB: 04-Jan-1951 DOA: 07/24/2023 PCP: Alexander Iba, PA  Brief Narrative/Hospital Course: 55 yOF W/ htn,chronic HFpEF, hypothyroidism, glaucoma, history of tuberculosis, presented 4/22> to the ED due to altered mental status for the past 3 days and w/ associated with increased urinary frequency, recently treated for ESBL E. coli UTI completed antibiotics 4/21 In the ED: Found to be febrile altered mental status UA negative for pyuria, COVID-19 by PCR positive. Cxr>no active disease. CT abdomen pelvis>> no acute abnormality in the abdomen or pelvis, hepatic steatosis.  Aortic atherosclerosis.   S/p 1 dose of Merrem   2/2 ESBL hx  and admitted for further management  Subjective: Seen this am Having low-grade temperature, vitals otherwise stable on room air BP stable Labs this morning stable CBC CMP She is aaox3  Assessment and plan:  COVID-19 viral infection: Stable respiratory wise no hypoxia no active disease in the chest x-ray.  Given her risk factors and age continue Paxlovid  x 5 days, airborned precautions,supportive care.  Still febrile this morning ensure remains afebrile before discharge   Acute metabolic encephalopathy suspect secondary to COVID-19 viral infection: No other obvious infection, chest x-ray UA unremarkable, suspect encephalopathy due to COVID infection.  Continue fall precautions supportive care delirium precaution. Monitor neurochecks - now aaox3. Fu VBG for co2 check nh3, tsh. Strict n.p.o. until more alert.   Hypothyroidism Cont levothyroxine    History of tuberculosis per medical records No active disease on chest x-ray   Generalized weakness/deconditioning Cont fall precaution, PT OT evaluation   Class I Obesity w/ Body mass index is 33.17 kg/m.: Will benefit with PCP follow-up, weight loss,healthy lifestyle and outpatient sleep eval if not done.  DVT prophylaxis: enoxaparin  (LOVENOX ) injection 40 mg Start:  07/25/23 1000 Code Status:   Code Status: Full Code Family Communication: plan of care discussed with patient at bedside. Patient status is: Remains hospitalized because of severity of illness Level of care: Telemetry   Dispo: The patient is from: home alone            Anticipated disposition: TBD PTOT to see Objective: Vitals last 24 hrs: Vitals:   07/25/23 0450 07/25/23 0453 07/25/23 0838 07/25/23 0927  BP:  (!) 156/77  (!) 151/83  Pulse:  67  73  Resp:  18    Temp:  99.5 F (37.5 C)  98.4 F (36.9 C)  TempSrc:  Oral    SpO2:  97% 98% 99%  Weight: 97.5 kg     Height:        Physical Examination: General exam:aaox3 older than stated age HEENT:Oral mucosa moist, Ear/Nose WNL grossly Respiratory system: Bilaterally diminished BS, no use of accessory muscle Cardiovascular system: S1 & S2 +. Gastrointestinal system: Abdomen soft, NT,ND,BS+ Nervous System: aaox3 non focal Extremities: LE edema neg, moving arms, warm legs Skin: No rashes,warm. MSK: Normal muscle bulk/tone.   Data Reviewed: I have personally reviewed following labs and imaging studies ( see epic result tab) CBC: Recent Labs  Lab 07/24/23 1851 07/25/23 0556  WBC 7.2 6.3  NEUTROABS 5.3  --   HGB 14.8 14.4  HCT 46.1* 43.4  MCV 91.8 88.4  PLT 244 211   CMP: Recent Labs  Lab 07/25/23 0556  NA 136  K 3.7  CL 99  CO2 24  GLUCOSE 107*  BUN 14  CREATININE 1.04*  CALCIUM 9.6  MG 1.8  PHOS 2.9   GFR: Estimated Creatinine Clearance: 57.5 mL/min (A) (by C-G formula based on SCr of  1.04 mg/dL (H)). Recent Labs  Lab 07/25/23 0556  AST 17  ALT 17  ALKPHOS 81  BILITOT 1.1  PROT 7.6  ALBUMIN 3.7   No results for input(s): "LIPASE", "AMYLASE" in the last 168 hours. No results for input(s): "AMMONIA" in the last 168 hours. Coagulation Profile: No results for input(s): "INR", "PROTIME" in the last 168 hours.  Medications reviewed:  Scheduled Meds:  enoxaparin  (LOVENOX ) injection  40 mg Subcutaneous  Q24H   levothyroxine   100 mcg Oral QAC breakfast   metoprolol  tartrate  25 mg Oral BID   multivitamin with minerals  1 tablet Oral Daily   nirmatrelvir /ritonavir  (renal dosing)  2 tablet Oral BID   Continuous Infusions:  lactated ringers  50 mL/hr at 07/24/23 2352    Total time spent in review of labs and imaging, patient evaluation, formulation of plan, documentation and communication with patient/family: 35 minutes  Lesa Rape, MD [facility] 07/25/2023, 10:11 AM

## 2023-07-25 NOTE — [place] FL2 (Signed)
 [place]  MEDICAID FL2 LEVEL OF CARE FORM     IDENTIFICATION  Patient Name: Kari Coleman Birthdate: 10/06/1950 Sex: female Admission Date (Current Location): 07/24/2023  [facility] and [place] Number:  Producer, television/film/video and Address:  [facility],  [address] [place], [place] [address]      Provider Number: [id number]  Attending Physician Name and Address:  Lesa Rape, MD  Relative Name and Phone Number:  [name] (Daughter)  [phone]    Current Level of Care: Hospital Recommended Level of Care: Skilled Nursing Facility Prior Approval Number:    Date Approved/Denied:   PASRR Number: [id number]  Discharge Plan: SNF    Current Diagnoses: Patient Active Problem List   Diagnosis Date Noted   COVID-19 07/24/2023   UTI (urinary tract infection) 12/19/2022   Acute encephalopathy 12/19/2022   Herpes zoster without complication 08/31/2022   Chronic deep vein thrombosis (DVT) of left popliteal vein (HCC) 12/28/2021   Acute pulmonary embolism (HCC) 12/26/2021   Urinary incontinence 07/18/2021   Tuberculosis 07/18/2021   Tobacco abuse 07/18/2021   Retinal detachment 07/18/2021   PONV (postoperative nausea and vomiting) 07/18/2021   Palpitations 07/18/2021   Hyperthyroidism 07/18/2021   History of echocardiogram 07/18/2021   GERD (gastroesophageal reflux disease) 07/18/2021   Cervical cancer (HCC) 07/18/2021   Cataract 07/18/2021   Arthritis 07/18/2021   Allergy 07/18/2021   Chronic angle-closure glaucoma of both eyes, severe stage 03/02/2021   Vitamin D  deficiency 01/22/2020   Multinodular goiter 01/22/2020   Fatigue 10/21/2019   Primary osteoarthritis of right knee 04/22/2019   Abnormal EKG 04/14/2019   Hypothyroidism 04/11/2019   Elevated glucose 04/08/2019   Chest discomfort 04/25/2018   Ex-smoker 04/25/2018   Primary localized osteoarthritis of right knee 01/04/2016   Glaucoma 2017   Essential hypertension     Orientation RESPIRATION  BLADDER Height & Weight     Self  Normal Continent Weight: 214 lb 15.2 oz (97.5 kg) Height:  5' 7.5" (171.5 cm)  BEHAVIORAL SYMPTOMS/MOOD NEUROLOGICAL BOWEL NUTRITION STATUS      Continent Diet (Regular)  AMBULATORY STATUS COMMUNICATION OF NEEDS Skin   Extensive Assist Verbally Normal                       Personal Care Assistance Level of Assistance  Bathing, Feeding, Dressing Bathing Assistance: Limited assistance Feeding assistance: Independent Dressing Assistance: Limited assistance     Functional Limitations Info  Sight, Hearing, Speech Sight Info: Impaired Hearing Info: Impaired Speech Info: Adequate    SPECIAL CARE FACTORS FREQUENCY                       Contractures Contractures Info: Not present    Additional Factors Info  Code Status, Allergies Code Status Info: Full Allergies Info: Doxycycline , Codeine, Pollen Extract           Current Medications (07/25/2023):  This is the current hospital active medication list Current Facility-Administered Medications  Medication Dose Route Frequency Provider Last Rate Last Admin   acetaminophen  (TYLENOL ) suppository 650 mg  650 mg Rectal Q4H PRN Reesa Cannon N, DO   650 mg at 07/25/23 0319   acetaminophen  (TYLENOL ) tablet 650 mg  650 mg Oral Q6H PRN Reesa Cannon N, DO       brimonidine  (ALPHAGAN ) 0.2 % ophthalmic solution 1 drop  1 drop Right Eye QHS Kc, Ramesh, MD       cholecalciferol  (VITAMIN D3) tablet 2,000 Units  2,000  Units Oral Daily Kc, Ramesh, MD       cyanocobalamin  (VITAMIN B12) tablet 1,000 mcg  1,000 mcg Oral Daily Kc, Ramesh, MD       enoxaparin  (LOVENOX ) injection 40 mg  40 mg Subcutaneous Q24H Hall, Carole N, DO   40 mg at 07/25/23 1610   hydrALAZINE  (APRESOLINE ) injection 5 mg  5 mg Intravenous Q6H PRN Hall, Carole N, DO   5 mg at 07/25/23 0113   ipratropium-albuterol  (DUONEB) 0.5-2.5 (3) MG/3ML nebulizer solution 3 mL  3 mL Nebulization Q4H PRN Lesa Rape, MD       lactated ringers   infusion   Intravenous Continuous Bary Boss, DO 50 mL/hr at 07/24/23 2352 New Bag at 07/24/23 2352   latanoprost  (XALATAN ) 0.005 % ophthalmic solution 1 drop  1 drop Both Eyes QHS Kc, Ramesh, MD       levothyroxine  (SYNTHROID ) tablet 100 mcg  100 mcg Oral QAC breakfast Reesa Cannon N, DO       metoprolol  tartrate (LOPRESSOR ) tablet 25 mg  25 mg Oral BID Hall, Carole N, DO       multivitamin with minerals tablet 1 tablet  1 tablet Oral Daily Hall, Carole N, DO       nirmatrelvir /ritonavir  (renal dosing) (PAXLOVID ) 2 tablet  2 tablet Oral BID Kc, Ramesh, MD       pantoprazole  (PROTONIX ) EC tablet 40 mg  40 mg Oral Daily Kc, Ramesh, MD       polyethylene glycol (MIRALAX  / GLYCOLAX ) packet 17 g  17 g Oral Daily PRN Reesa Cannon N, DO       pravastatin  (PRAVACHOL ) tablet 10 mg  10 mg Oral Daily Kc, Ramesh, MD       prochlorperazine  (COMPAZINE ) injection 5 mg  5 mg Intravenous Q6H PRN Bary Boss, DO         Discharge Medications: Please see discharge summary for a list of discharge medications.  Relevant Imaging Results:  Relevant Lab Results:   Additional Information SSN: [id number] ; COVID+ as of 07/24/2023  Melinda Sprawls Pharell Rolfson, LCSW

## 2023-07-26 DIAGNOSIS — Z741 Need for assistance with personal care: Secondary | ICD-10-CM | POA: Diagnosis not present

## 2023-07-26 DIAGNOSIS — M6281 Muscle weakness (generalized): Secondary | ICD-10-CM | POA: Diagnosis not present

## 2023-07-26 DIAGNOSIS — K219 Gastro-esophageal reflux disease without esophagitis: Secondary | ICD-10-CM | POA: Diagnosis not present

## 2023-07-26 DIAGNOSIS — Z8249 Family history of ischemic heart disease and other diseases of the circulatory system: Secondary | ICD-10-CM | POA: Diagnosis not present

## 2023-07-26 DIAGNOSIS — Z881 Allergy status to other antibiotic agents status: Secondary | ICD-10-CM | POA: Diagnosis not present

## 2023-07-26 DIAGNOSIS — G9341 Metabolic encephalopathy: Secondary | ICD-10-CM | POA: Diagnosis present

## 2023-07-26 DIAGNOSIS — G9340 Encephalopathy, unspecified: Secondary | ICD-10-CM | POA: Diagnosis not present

## 2023-07-26 DIAGNOSIS — E66811 Obesity, class 1: Secondary | ICD-10-CM | POA: Diagnosis present

## 2023-07-26 DIAGNOSIS — R54 Age-related physical debility: Secondary | ICD-10-CM | POA: Diagnosis present

## 2023-07-26 DIAGNOSIS — R1930 Abdominal rigidity, unspecified site: Secondary | ICD-10-CM | POA: Diagnosis not present

## 2023-07-26 DIAGNOSIS — Z8611 Personal history of tuberculosis: Secondary | ICD-10-CM | POA: Diagnosis not present

## 2023-07-26 DIAGNOSIS — Z79890 Hormone replacement therapy (postmenopausal): Secondary | ICD-10-CM | POA: Diagnosis not present

## 2023-07-26 DIAGNOSIS — U071 COVID-19: Secondary | ICD-10-CM | POA: Diagnosis not present

## 2023-07-26 DIAGNOSIS — Z743 Need for continuous supervision: Secondary | ICD-10-CM | POA: Diagnosis not present

## 2023-07-26 DIAGNOSIS — Z87891 Personal history of nicotine dependence: Secondary | ICD-10-CM | POA: Diagnosis not present

## 2023-07-26 DIAGNOSIS — I130 Hypertensive heart and chronic kidney disease with heart failure and stage 1 through stage 4 chronic kidney disease, or unspecified chronic kidney disease: Secondary | ICD-10-CM | POA: Diagnosis present

## 2023-07-26 DIAGNOSIS — I700 Atherosclerosis of aorta: Secondary | ICD-10-CM | POA: Diagnosis present

## 2023-07-26 DIAGNOSIS — E871 Hypo-osmolality and hyponatremia: Secondary | ICD-10-CM | POA: Diagnosis present

## 2023-07-26 DIAGNOSIS — Z96651 Presence of right artificial knee joint: Secondary | ICD-10-CM | POA: Diagnosis present

## 2023-07-26 DIAGNOSIS — Z6831 Body mass index (BMI) 31.0-31.9, adult: Secondary | ICD-10-CM | POA: Diagnosis not present

## 2023-07-26 DIAGNOSIS — Z90710 Acquired absence of both cervix and uterus: Secondary | ICD-10-CM | POA: Diagnosis not present

## 2023-07-26 DIAGNOSIS — M1990 Unspecified osteoarthritis, unspecified site: Secondary | ICD-10-CM | POA: Diagnosis present

## 2023-07-26 DIAGNOSIS — M6259 Muscle wasting and atrophy, not elsewhere classified, multiple sites: Secondary | ICD-10-CM | POA: Diagnosis not present

## 2023-07-26 DIAGNOSIS — R4182 Altered mental status, unspecified: Secondary | ICD-10-CM | POA: Diagnosis present

## 2023-07-26 DIAGNOSIS — H409 Unspecified glaucoma: Secondary | ICD-10-CM | POA: Diagnosis present

## 2023-07-26 DIAGNOSIS — E039 Hypothyroidism, unspecified: Secondary | ICD-10-CM | POA: Diagnosis present

## 2023-07-26 DIAGNOSIS — Z87440 Personal history of urinary (tract) infections: Secondary | ICD-10-CM | POA: Diagnosis not present

## 2023-07-26 DIAGNOSIS — I5032 Chronic diastolic (congestive) heart failure: Secondary | ICD-10-CM | POA: Diagnosis present

## 2023-07-26 DIAGNOSIS — Z7401 Bed confinement status: Secondary | ICD-10-CM | POA: Diagnosis not present

## 2023-07-26 DIAGNOSIS — H5462 Unqualified visual loss, left eye, normal vision right eye: Secondary | ICD-10-CM | POA: Diagnosis present

## 2023-07-26 DIAGNOSIS — N1831 Chronic kidney disease, stage 3a: Secondary | ICD-10-CM | POA: Diagnosis present

## 2023-07-26 DIAGNOSIS — Z8541 Personal history of malignant neoplasm of cervix uteri: Secondary | ICD-10-CM | POA: Diagnosis not present

## 2023-07-26 LAB — COMPREHENSIVE METABOLIC PANEL WITH GFR
ALT: 15 U/L (ref 0–44)
AST: 15 U/L (ref 15–41)
Albumin: 3.4 g/dL — ABNORMAL LOW (ref 3.5–5.0)
Alkaline Phosphatase: 68 U/L (ref 38–126)
Anion gap: 10 (ref 5–15)
BUN: 19 mg/dL (ref 8–23)
CO2: 21 mmol/L — ABNORMAL LOW (ref 22–32)
Calcium: 9.2 mg/dL (ref 8.9–10.3)
Chloride: 101 mmol/L (ref 98–111)
Creatinine, Ser: 1.12 mg/dL — ABNORMAL HIGH (ref 0.44–1.00)
GFR, Estimated: 52 mL/min — ABNORMAL LOW (ref >60)
Glucose, Bld: 106 mg/dL — ABNORMAL HIGH (ref 70–99)
Potassium: 3.7 mmol/L (ref 3.5–5.1)
Sodium: 132 mmol/L — ABNORMAL LOW (ref 135–145)
Total Bilirubin: 1.2 mg/dL (ref 0.0–1.2)
Total Protein: 7.2 g/dL (ref 6.5–8.1)

## 2023-07-26 NOTE — Progress Notes (Signed)
 PROGRESS NOTE Kari Coleman  NUU:[id number] DOB: [birth year] DOA: 07/24/2023 PCP: Alexander Iba, PA  Brief Narrative/Hospital Course: 29 yOF W/ htn,chronic HFpEF, hypothyroidism, glaucoma, history of tuberculosis, presented 4/22> to the ED due to altered mental status for the past 3 days and w/ associated with increased urinary frequency, recently treated for ESBL E. coli UTI completed antibiotics 4/21 In the ED: Found to be febrile altered mental status UA negative for pyuria, COVID-19 by PCR positive. Cxr>no active disease. CT abdomen pelvis>> no acute abnormality in the abdomen or pelvis, hepatic steatosis.  Aortic atherosclerosis.   S/p 1 dose of Merrem   2/2 ESBL hx  and admitted for further management  Subjective: Seen and examined Complains of cough with regular phlegm No chest pain dysuria nausea vomiting Remains alert awake oriented x 3 Overnight still having fever Tmax 101.2, remains on room air, BP stable  Labs reviewed mild hyponatremia otherwise fairly stable   Assessment and plan:  COVID-19 viral infection: Stable respiratory wise w/o hypoxia no active disease in the chest x-ray. But patient had significant deconditioning debility and also confused on presentation Mentation improved but remains weak and frail cont PTOT given her risk factors and age continue Paxlovid  x 5 days Cont airborned precautions,supportive care and still intermittently febrile ensure remains afebrile before discharge   Acute metabolic encephalopathy suspect secondary to COVID-19 viral infection: No other obvious infection, chest x-ray UA unremarkable, suspect encephalopathy due to COVID infection.  Mentation has improved continue delirium precaution precaution PT OT   Hypothyroidism Stable, cont levothyroxine    History of tuberculosis per medical records No active disease on chest x-ray   Generalized weakness/deconditioning Seen by PT OT advised skilled nursing facility  Class I Obesity  w/ Body mass index is 31.5 kg/m.: Will benefit with PCP follow-up, weight loss,healthy lifestyle and outpatient sleep eval if not done.  DVT prophylaxis: enoxaparin  (LOVENOX ) injection 40 mg Start: 07/25/23 1000 Code Status:   Code Status: Full Code Family Communication: plan of care discussed with patient at bedside. Patient status is: Remains hospitalized because of severity of illness Level of care: Telemetry   Dispo: The patient is from: home alone            Anticipated disposition: SNF once afebrile  likely in next 24hr-48 hrs Objective: Vitals last 24 hrs: Vitals:   07/26/23 0500 07/26/23 0507 07/26/23 0911 07/26/23 1145  BP:  (!) 159/90 (!) 151/85 (!) 156/96  Pulse:  (!) 59 73 68  Resp:  18  17  Temp:  99.5 F (37.5 C)  98.7 F (37.1 C)  TempSrc:  Oral    SpO2:  97%  96%  Weight: 92.6 kg     Height:        Physical Examination: General exam:aaox3, frail ill appearing, older than stated age HEENT:Oral mucosa moist, Ear/Nose WNL grossly Respiratory system: Bilaterally diminished BS, no use of accessory muscle Cardiovascular system: S1 & S2 +. Gastrointestinal system: Abdomen soft, NT,ND,BS+ Nervous System: aaox3 non focal Extremities: LE edema neg, moving arms, warm legs Skin: No rashes,warm. MSK: Normal muscle bulk/tone.   Data Reviewed: I have personally reviewed following labs and imaging studies ( see epic result tab) CBC: Recent Labs  Lab 07/24/23 1851 07/25/23 0556  WBC 7.2 6.3  NEUTROABS 5.3  --   HGB 14.8 14.4  HCT 46.1* 43.4  MCV 91.8 88.4  PLT 244 211   CMP: Recent Labs  Lab 07/25/23 0556 07/26/23 0538  NA 136 132*  K 3.7 3.7  CL 99 101  CO2 24 21*  GLUCOSE 107* 106*  BUN 14 19  CREATININE 1.04* 1.12*  CALCIUM 9.6 9.2  MG 1.8  --   PHOS 2.9  --    GFR: Estimated Creatinine Clearance: 52 mL/min (A) (by C-G formula based on SCr of 1.12 mg/dL (H)). Recent Labs  Lab 07/25/23 0556 07/26/23 0538  AST 17 15  ALT 17 15  ALKPHOS 81 68   BILITOT 1.1 1.2  PROT 7.6 7.2  ALBUMIN 3.7 3.4*   No results for input(s): "LIPASE", "AMYLASE" in the last 168 hours.  Recent Labs  Lab 07/25/23 1025  AMMONIA 31   Coagulation Profile: No results for input(s): "INR", "PROTIME" in the last 168 hours.  Medications reviewed:  Scheduled Meds:  brimonidine   1 drop Right Eye QHS   cholecalciferol   2,000 Units Oral Daily   cyanocobalamin   1,000 mcg Oral Daily   enoxaparin  (LOVENOX ) injection  40 mg Subcutaneous Q24H   latanoprost   1 drop Both Eyes QHS   levothyroxine   100 mcg Oral QAC breakfast   metoprolol  tartrate  25 mg Oral BID   multivitamin with minerals  1 tablet Oral Daily   nirmatrelvir /ritonavir  (renal dosing)  2 tablet Oral BID   pantoprazole   40 mg Oral Daily   pravastatin   10 mg Oral Daily   Continuous Infusions:   Total time spent in review of labs and imaging, patient evaluation, formulation of plan, documentation and communication with patient/family: 35 minutes  Lesa Rape, MD [facility] 07/26/2023, 11:47 AM

## 2023-07-26 NOTE — Plan of Care (Signed)
  Problem: Education: Goal: Knowledge of risk factors and measures for prevention of condition will improve Outcome: Progressing   Problem: Clinical Measurements: Goal: Ability to maintain clinical measurements within normal limits will improve Outcome: Progressing   Problem: Coping: Goal: Psychosocial and spiritual needs will be supported Outcome: Progressing   Problem: Activity: Goal: Risk for activity intolerance will decrease Outcome: Progressing

## 2023-07-26 NOTE — TOC Progression Note (Addendum)
 Transition of Care [facility]) - Progression Note    Patient Details  Name: Kari Coleman MRN: [id number] Date of Birth: [birth year]  [facility]) CM/SW Contact  Marty Sleet, LCSW Phone Number: 07/26/2023, 10:26 AM  Clinical Narrative:    Left VM for pt's daughter to review bed offers for SNF. Awaiting return call.   ADDENDUM: Spoke with pt's daughter via t/c to review bed offers for SNF. Pt's daughter accepted bed offer for [facility]. [facility] able to accept pt with COVID + status. Insurance auth requested and currently pending approval.   ADDENDUM 2: Insurance auth approved for SNF. Pt able to transfer once medically stable.    Expected Discharge Plan: Skilled Nursing Facility Barriers to Discharge: Continued Medical Work up, English as a second language teacher, SNF Pending bed offer  Expected Discharge Plan and Services In-house Referral: Clinical Social Work   Post Acute Care Choice: Skilled Nursing Facility Living arrangements for the past 2 months: Single Family Home                                       Social Determinants of Health (SDOH) Interventions SDOH Screenings   Food Insecurity: No Food Insecurity (07/25/2023)  Housing: Low Risk  (07/25/2023)  Transportation Needs: No Transportation Needs (07/25/2023)  Utilities: Not At Risk (07/25/2023)  Alcohol Screen: Low Risk  (12/12/2021)  Depression (PHQ2-9): Low Risk  (04/11/2023)  Financial Resource Strain: Low Risk  (12/12/2021)  Physical Activity: Inactive (12/12/2021)  Social Connections: Moderately Isolated (07/25/2023)  Stress: No Stress Concern Present (12/12/2021)  Tobacco Use: Medium Risk (07/24/2023)    Readmission Risk Interventions    12/21/2022    1:40 PM  Readmission Risk Prevention Plan  Transportation Screening Complete  PCP or Specialist Appt within 5-7 Days Complete  Home Care Screening Complete  Medication Review (RN CM) Complete

## 2023-07-26 NOTE — Plan of Care (Signed)
  Problem: Nutrition: Goal: Adequate nutrition will be maintained Outcome: Progressing   Problem: Safety: Goal: Ability to remain free from injury will improve Outcome: Progressing   

## 2023-07-26 NOTE — Progress Notes (Signed)
 Physical Therapy Treatment Patient Details Name: Kari Coleman MRN: [id number] DOB: 05/13/1950 Today's Date: 07/26/2023   History of Present Illness 73 yo female admitted with COVID, acute met encephalopathy. Hx of TKA, COVID, cervical Ca, hyperthyroidism, TB, DVT, PE, essential HTN    PT Comments  Pt has improved tolerance to activity today and states that she has been able to get up to use the rest room during the day. Pt requires sup A for bed mobility with HOB elevated, SBA/heavy cues for STS transfer, able to amb greater distance this session on level surfaces. Has decreased stability with larger magnitude directional changes in small spaces. Based on pt PLOF, level of support, and current functional status, she would benefit from skilled PT <3 hours per day at dc.     If plan is discharge home, recommend the following: A little help with walking and/or transfers;A little help with bathing/dressing/bathroom;Assist for transportation;Help with stairs or ramp for entrance;Assistance with cooking/housework   Can travel by private vehicle        Equipment Recommendations  None recommended by PT    Recommendations for Other Services       Precautions / Restrictions Precautions Precautions: Fall Recall of Precautions/Restrictions: Intact Restrictions Weight Bearing Restrictions Per Provider Order: No     Mobility  Bed Mobility Overal bed mobility: Needs Assistance Bed Mobility: Supine to Sit     Supine to sit: Supervision          Transfers Overall transfer level: Needs assistance Equipment used: Rolling walker (2 wheels) Transfers: Sit to/from Stand Sit to Stand: Contact guard assist           General transfer comment: cues for setup, hand placement and BOS    Ambulation/Gait Ambulation/Gait assistance: Contact guard assist Gait Distance (Feet): 72 Feet Assistive device: Rolling walker (2 wheels) Gait Pattern/deviations: Step-through pattern, Decreased  stride length, Trunk flexed, Wide base of support Gait velocity: dec     General Gait Details: Pt has decreased cadence, able to complete laps in room, uses step through pattern and completes directional changes with a further decrease in cadence, good weight shifting. When turning to sit in the chair from amb, has an episode of instability corrected with CGA using gait belt, and 2WW   Stairs             Wheelchair Mobility     Tilt Bed    Modified Rankin (Stroke Patients Only)       Balance Overall balance assessment: Needs assistance, History of Falls Sitting-balance support: Feet supported Sitting balance-Leahy Scale: Fair     Standing balance support: During functional activity, Single extremity supported, Reliant on assistive device for balance Standing balance-Leahy Scale: Fair                              Hotel manager: No apparent difficulties  Cognition Arousal: Lethargic Behavior During Therapy: WFL for tasks assessed/performed   PT - Cognitive impairments: No apparent impairments                         Following commands: Intact      Cueing Cueing Techniques: Verbal cues, Gestural cues, Tactile cues, Visual cues  Exercises      General Comments        Pertinent Vitals/Pain Pain Assessment Pain Assessment: 0-10    Home Living  Prior Function            PT Goals (current goals can now be found in the care plan section) Acute Rehab PT Goals Patient Stated Goal: none stated PT Goal Formulation: With patient Time For Goal Achievement: 08/08/23 Potential to Achieve Goals: Good Progress towards PT goals: Progressing toward goals    Frequency    Min 3X/week      PT Plan      Co-evaluation              AM-PAC PT "6 Clicks" Mobility   Outcome Measure  Help needed turning from your back to your side while in a flat bed without using  bedrails?: A Little Help needed moving from lying on your back to sitting on the side of a flat bed without using bedrails?: A Little Help needed moving to and from a bed to a chair (including a wheelchair)?: A Little Help needed standing up from a chair using your arms (e.g., wheelchair or bedside chair)?: A Little Help needed to walk in hospital room?: A Little Help needed climbing 3-5 steps with a railing? : A Lot 6 Click Score: 17    End of Session Equipment Utilized During Treatment: Gait belt Activity Tolerance: Patient tolerated treatment well;Patient limited by fatigue Patient left: with call bell/phone within reach;in chair;with chair alarm set;with nursing/sitter in room Nurse Communication: Mobility status PT Visit Diagnosis: Muscle weakness (generalized) (M62.81);Difficulty in walking, not elsewhere classified (R26.2);History of falling (Z91.81)     Time: 1610-9604 PT Time Calculation (min) (ACUTE ONLY): 21 min  Charges:    $Gait Training: 8-22 mins PT General Charges $$ ACUTE PT VISIT: 1 Visit                     Kari Coleman, PT Acute Rehabilitation Services Office: [phone] 07/26/2023    Kari Coleman 07/26/2023, 5:11 PM

## 2023-07-26 NOTE — TOC CM/SW Note (Signed)
 [facility] [address] [place], [place] [address] [phone] Overall rating?? Below average  [facility] [address] [place], [place] [address] [phone] Overall rating ????

## 2023-07-27 DIAGNOSIS — E039 Hypothyroidism, unspecified: Secondary | ICD-10-CM | POA: Diagnosis not present

## 2023-07-27 DIAGNOSIS — Z743 Need for continuous supervision: Secondary | ICD-10-CM | POA: Diagnosis not present

## 2023-07-27 DIAGNOSIS — G9340 Encephalopathy, unspecified: Secondary | ICD-10-CM | POA: Diagnosis not present

## 2023-07-27 DIAGNOSIS — K219 Gastro-esophageal reflux disease without esophagitis: Secondary | ICD-10-CM | POA: Diagnosis not present

## 2023-07-27 DIAGNOSIS — Z7401 Bed confinement status: Secondary | ICD-10-CM | POA: Diagnosis not present

## 2023-07-27 DIAGNOSIS — G9341 Metabolic encephalopathy: Secondary | ICD-10-CM | POA: Diagnosis not present

## 2023-07-27 DIAGNOSIS — R1930 Abdominal rigidity, unspecified site: Secondary | ICD-10-CM | POA: Diagnosis not present

## 2023-07-27 DIAGNOSIS — M6281 Muscle weakness (generalized): Secondary | ICD-10-CM | POA: Diagnosis not present

## 2023-07-27 DIAGNOSIS — M6259 Muscle wasting and atrophy, not elsewhere classified, multiple sites: Secondary | ICD-10-CM | POA: Diagnosis not present

## 2023-07-27 DIAGNOSIS — Z741 Need for assistance with personal care: Secondary | ICD-10-CM | POA: Diagnosis not present

## 2023-07-27 DIAGNOSIS — I10 Essential (primary) hypertension: Secondary | ICD-10-CM | POA: Diagnosis not present

## 2023-07-27 DIAGNOSIS — U071 COVID-19: Secondary | ICD-10-CM | POA: Diagnosis not present

## 2023-07-27 DIAGNOSIS — N390 Urinary tract infection, site not specified: Secondary | ICD-10-CM | POA: Diagnosis not present

## 2023-07-27 LAB — COMPREHENSIVE METABOLIC PANEL WITH GFR
ALT: 15 U/L (ref 0–44)
AST: 18 U/L (ref 15–41)
Albumin: 3.2 g/dL — ABNORMAL LOW (ref 3.5–5.0)
Alkaline Phosphatase: 70 U/L (ref 38–126)
Anion gap: 11 (ref 5–15)
BUN: 28 mg/dL — ABNORMAL HIGH (ref 8–23)
CO2: 23 mmol/L (ref 22–32)
Calcium: 9.1 mg/dL (ref 8.9–10.3)
Chloride: 104 mmol/L (ref 98–111)
Creatinine, Ser: 1.08 mg/dL — ABNORMAL HIGH (ref 0.44–1.00)
GFR, Estimated: 54 mL/min — ABNORMAL LOW (ref >60)
Glucose, Bld: 111 mg/dL — ABNORMAL HIGH (ref 70–99)
Potassium: 3.9 mmol/L (ref 3.5–5.1)
Sodium: 138 mmol/L (ref 135–145)
Total Bilirubin: 0.7 mg/dL (ref 0.0–1.2)
Total Protein: 6.9 g/dL (ref 6.5–8.1)

## 2023-07-27 MED ORDER — NIRMATRELVIR/RITONAVIR (PAXLOVID) TABLET (RENAL DOSING): 2.0000 | ORAL_TABLET | Freq: Two times a day (BID) | ORAL | Status: AC

## 2023-07-27 MED ORDER — AMLODIPINE BESYLATE 10 MG PO TABS
10.0000 mg | ORAL_TABLET | Freq: Every day | ORAL | Status: DC
  Administered 2023-07-27: 10 mg via ORAL
  Filled 2023-07-27: qty 1

## 2023-07-27 NOTE — TOC Transition Note (Signed)
 Transition of Care [facility]) - Discharge Note   Patient Details  Name: Kari Coleman MRN: [id number] Date of Birth: Mar 30, 1951  [facility]) CM/SW Contact:  Tessie Fila, RN Phone Number: 07/27/2023, 1:13 PM   Clinical Narrative:    Pt agrees to go to [facility] for Rehab. Her daughter is in agreement with discharge plans. D/C packet is at [employer] station. PTAR called for transportation and notified of isolation precautions. Floor nurse notified of report to call at [phone] to room 603.   Final next level of care: Skilled Nursing Facility Barriers to Discharge: Continued Medical Work up, English as a second language teacher, SNF Pending bed offer   Patient Goals and CMS Choice Patient states their goals for this hospitalization and ongoing recovery are:: To go to SNF CMS Medicare.gov Compare Post Acute Care list provided to:: Patient Represenative (must comment) (pt's adult children) Choice offered to / list presented to : Adult Children      Discharge Placement                       Discharge Plan and Services Additional resources added to the After Visit Summary for   In-house Referral: Clinical Social Work   Post Acute Care Choice: Skilled Nursing Facility                               Social Drivers of Health (SDOH) Interventions SDOH Screenings   Food Insecurity: No Food Insecurity (07/25/2023)  Housing: Low Risk  (07/25/2023)  Transportation Needs: No Transportation Needs (07/25/2023)  Utilities: Not At Risk (07/25/2023)  Alcohol Screen: Low Risk  (12/12/2021)  Depression (PHQ2-9): Low Risk  (04/11/2023)  Financial Resource Strain: Low Risk  (12/12/2021)  Physical Activity: Inactive (12/12/2021)  Social Connections: Moderately Isolated (07/25/2023)  Stress: No Stress Concern Present (12/12/2021)  Tobacco Use: Medium Risk (07/24/2023)     Readmission Risk Interventions    07/27/2023   10:19 AM 12/21/2022    1:40 PM  Readmission Risk Prevention Plan   Transportation Screening Complete Complete  PCP or Specialist Appt within 5-7 Days Complete Complete  Home Care Screening Complete Complete  Medication Review (RN CM) Complete Complete

## 2023-07-27 NOTE — Progress Notes (Signed)
 Patient discharged per MD order. Discharge instructions given to pt per off going RN and PIV and telemetry discontinued by said RN. Pt in no apparent cardiac or respiratory distress, left the floor with mobile staff via stretcher to [facility].

## 2023-07-27 NOTE — Progress Notes (Signed)
 Called [facility] spoke with Laurelyn Ponder. Report given for patient and Ammon Bales is aware Lyna Sandhoff has been called for transport.

## 2023-07-27 NOTE — Discharge Summary (Signed)
 Physician Discharge Summary  Kari Coleman ZOX:[id number] DOB: Nov 05, 1950 DOA: 07/24/2023  PCP: Alexander Iba, PA  Admit date: 07/24/2023 Discharge date: 07/27/2023 Recommendations for Outpatient Follow-up:  Follow up with PCP in 1 weeks-call for appointment Please obtain BMP/CBC in one week Continue COVID isolation per CDC guideline  Discharge Dispo: snf Discharge Condition: Stable Code Status:   Code Status: Full Code Diet recommendation:  Diet Order             Diet regular Room service appropriate? Yes; Fluid consistency: Thin  Diet effective now                    Brief/Interim Summary: 20 yOF W/ htn,chronic HFpEF, hypothyroidism, glaucoma, history of tuberculosis, presented 4/22> to the ED due to altered mental status for the past 3 days and w/ associated with increased urinary frequency, recently treated for ESBL E. coli UTI completed antibiotics 4/21 In the ED: Found to be febrile altered mental status UA negative for pyuria, COVID-19 by PCR positive. Cxr>no active disease. CT abdomen pelvis>> no acute abnormality in the abdomen or pelvis, hepatic steatosis.  Aortic atherosclerosis.   S/p 1 dose of Merrem   2/2 ESBL hx  and admitted for further management of COVID-19 infection with stable respiratory status. Patient mental status improved with symptomatic management She is alert awake oriented, no respiratory issues doing well on room air.  Remains hospitalized due to ongoing fever due to COVID. Plan for skilled nursing facility remains afebrile on 4/25  Subjective: Seen and examined Resting company doing well No complaint.  No fever Overnight patient has remained afebrile BP BP on higher side stable labs relatively stable   Discharge diagnosis  COVID-19 viral infection: Stable respiratory wise w/o hypoxia no active disease in the chest x-ray. But patient had significant deconditioning debility and also confused on presentation Mentation improved but remains  weak and frail cont PTOT given her risk factors and age continue Paxlovid  x 5 days. Cont isolation standard for mild disease   Acute metabolic encephalopathy suspect secondary to COVID-19 viral infection: No other obvious infection, chest x-ray UA unremarkable, suspect encephalopathy due to COVID infection.  Mentation has improved continue delirium precaution precaution PT OT   Hypothyroidism Stable, cont levothyroxine    History of tuberculosis per medical records No active disease on chest x-ray   Generalized weakness/deconditioning Seen by PT OT advised skilled nursing facility  Hypertension: Continue home meds-resume amlodipine  and lisinopril  as SBP is high, continue metoprolol   Class I Obesity w/ Body mass index is 31.43 kg/m.: Will benefit with PCP follow-up, weight loss,healthy lifestyle and outpatient sleep eval if not done.   Discharge Exam: Vitals:   07/27/23 0543 07/27/23 0822  BP: (!) 155/94 (!) 163/121  Pulse: 60 (!) 55  Resp: 16 18  Temp: 98.9 F (37.2 C) 98 F (36.7 C)  SpO2: 98% 97%   General: Pt is alert, awake, not in acute distress Cardiovascular: RRR, S1/S2 +, no rubs, no gallops Respiratory: CTA bilaterally, no wheezing, no rhonchi Abdominal: Soft, NT, ND, bowel sounds + Extremities: no edema, no cyanosis  Discharge Instructions  Discharge Instructions     Discharge instructions   Complete by: As directed    Please call call MD or return to ER for similar or worsening recurring problem that brought you to hospital or if any fever,nausea/vomiting,abdominal pain, uncontrolled pain, chest pain,  shortness of breath or any other alarming symptoms.  Continue standard COVID isolation for mild disease as per [facility]  Please follow-up your doctor as instructed in a week time and call the office for appointment.  Please avoid alcohol, smoking, or any other illicit substance and maintain healthy habits including taking your regular medications as  prescribed.  You were cared for by a hospitalist during your hospital stay. If you have any questions about your discharge medications or the care you received while you were in the hospital after you are discharged, you can call the unit and ask to speak with the hospitalist on call if the hospitalist that took care of you is not available.  Once you are discharged, your primary care physician will handle any further medical issues. Please note that NO REFILLS for any discharge medications will be authorized once you are discharged, as it is imperative that you return to your primary care physician (or establish a relationship with a primary care physician if you do not have one) for your aftercare needs so that they can reassess your need for medications and monitor your lab values   Increase activity slowly   Complete by: As directed       Allergies as of 07/27/2023       Reactions   Doxycycline  Nausea Only   Codeine Nausea And Vomiting   Pollen Extract Itching, Other (See Comments)   Congestion, itchy eyes, runny nose        Medication List     TAKE these medications    acetaminophen  325 MG tablet Commonly known as: TYLENOL  Take 650 mg by mouth every 4 (four) hours as needed for mild pain or moderate pain.   albuterol  108 (90 Base) MCG/ACT inhaler Commonly known as: Proventil  HFA Inhale 2 puffs into the lungs every 6 (six) hours as needed for wheezing or shortness of breath.   albuterol  (2.5 MG/3ML) 0.083% nebulizer solution Commonly known as: PROVENTIL  Take 3 mLs (2.5 mg total) by nebulization every 6 (six) hours as needed for wheezing or shortness of breath.   amLODipine  10 MG tablet Commonly known as: NORVASC  Take 1 tablet (10 mg total) by mouth daily.   B-12 1000 MCG Tabs Take 1 tablet (1,000 mcg total) by mouth daily.   brimonidine  0.2 % ophthalmic solution Commonly known as: ALPHAGAN  Place 1 drop into the right eye at bedtime.   latanoprost  0.005 % ophthalmic  solution Commonly known as: XALATAN  Place 1 drop into both eyes at bedtime.   levothyroxine  100 MCG tablet Commonly known as: SYNTHROID  Take 1 tablet (100 mcg total) by mouth daily before breakfast.   lisinopril  40 MG tablet Commonly known as: ZESTRIL  TAKE 1 TABLET BY MOUTH DAILY   metoprolol  tartrate 25 MG tablet Commonly known as: LOPRESSOR  Take 1 tablet (25 mg total) by mouth 2 (two) times daily.   nirmatrelvir /ritonavir  (renal dosing) 10 x 150 MG & 10 x 100MG  Tabs Commonly known as: PAXLOVID  Take 2 tablets by mouth 2 (two) times daily for 5 days. Patient GFR is 54. Take nirmatrelvir  (150 mg) one tablet twice daily for 5 days and ritonavir  (100 mg) one tablet twice daily for 5 days.   ondansetron  4 MG tablet Commonly known as: ZOFRAN  Take 1 tablet (4 mg total) by mouth every 6 (six) hours.   pantoprazole  40 MG tablet Commonly known as: PROTONIX  Take 1 tablet (40 mg total) by mouth daily.   polyethylene glycol 17 g packet Commonly known as: MIRALAX  / GLYCOLAX  Take 17 g by mouth daily. What changed:  when to take this reasons to take this   pravastatin   10 MG tablet Commonly known as: PRAVACHOL  TAKE 1 TABLET BY MOUTH DAILY   senna-docusate 8.6-50 MG tablet Commonly known as: Senokot-S Take 2 tablets by mouth 2 (two) times daily. What changed:  when to take this reasons to take this   trimethoprim 100 MG tablet Commonly known as: TRIMPEX Take 100 mg by mouth daily.   Vitamin D -3 25 MCG (1000 UT) Caps Take 2,000 Units by mouth daily.        Contact information for after-discharge care     Destination     [facility] Preferred SNF .   Service: Skilled Nursing Contact information: [address] [place]  [address] [phone]                    Allergies  Allergen Reactions   Doxycycline  Nausea Only   Codeine Nausea And Vomiting   Pollen Extract Itching and Other (See Comments)     Congestion, itchy eyes, runny nose    The results of significant diagnostics from this hospitalization (including imaging, microbiology, ancillary and laboratory) are listed below for reference.    Microbiology: Recent Results (from the past 240 hours)  Culture, blood (routine x 2)     Status: None (Preliminary result)   Collection Time: 07/24/23  7:00 PM   Specimen: BLOOD  Result Value Ref Range Status   Specimen Description   Final    BLOOD BLOOD RIGHT HAND Performed at [facility], [address] [place], [place] [address]    Special Requests   Final    BOTTLES DRAWN AEROBIC ONLY Blood Culture results may not be optimal due to an inadequate volume of blood received in culture bottles Performed at [facility], [address] [place], [place] [address]    Culture   Final    NO GROWTH 3 DAYS Performed at [facility], [address], [place], [place] [address]    Report Status PENDING  Incomplete  Resp panel by RT-PCR (RSV, Flu A&B, Covid) Anterior Nasal Swab     Status: Abnormal   Collection Time: 07/24/23  7:40 PM   Specimen: Anterior Nasal Swab  Result Value Ref Range Status   SARS Coronavirus 2 by RT PCR POSITIVE (A) NEGATIVE Final    Comment: (NOTE) SARS-CoV-2 target nucleic acids are DETECTED.  The SARS-CoV-2 RNA is generally detectable in upper respiratory specimens during the acute phase of infection. Positive results are indicative of the presence of the identified virus, but do not rule out bacterial infection or co-infection with other pathogens not detected by the test. Clinical correlation with patient history and other diagnostic information is necessary to determine patient infection status. The expected result is Negative.  Fact Sheet for Patients: BloggerCourse.com  Fact Sheet for Healthcare Providers: SeriousBroker.it  This test is not yet approved or  cleared by the [place]  FDA and  has been authorized for detection and/or diagnosis of SARS-CoV-2 by FDA under an Emergency Use Authorization (EUA).  This EUA will remain in effect (meaning this test can be used) for the duration of  the COVID-19 declaration under Section 564(b)(1) of the A ct, 21 U.S.C. section 360bbb-3(b)(1), unless the authorization is terminated or revoked sooner.     Influenza A by PCR NEGATIVE NEGATIVE Final   Influenza B by PCR NEGATIVE NEGATIVE Final    Comment: (NOTE) The Xpert Xpress SARS-CoV-2/FLU/RSV plus assay is intended as an aid in the diagnosis of influenza from  Nasopharyngeal swab specimens and should not be used as a sole basis for treatment. Nasal washings and aspirates are unacceptable for Xpert Xpress SARS-CoV-2/FLU/RSV testing.  Fact Sheet for Patients: BloggerCourse.com  Fact Sheet for Healthcare Providers: SeriousBroker.it  This test is not yet approved or cleared by the [place]  FDA and has been authorized for detection and/or diagnosis of SARS-CoV-2 by FDA under an Emergency Use Authorization (EUA). This EUA will remain in effect (meaning this test can be used) for the duration of the COVID-19 declaration under Section 564(b)(1) of the Act, 21 U.S.C. section 360bbb-3(b)(1), unless the authorization is terminated or revoked.     Resp Syncytial Virus by PCR NEGATIVE NEGATIVE Final    Comment: (NOTE) Fact Sheet for Patients: BloggerCourse.com  Fact Sheet for Healthcare Providers: SeriousBroker.it  This test is not yet approved or cleared by the [place]  FDA and has been authorized for detection and/or diagnosis of SARS-CoV-2 by FDA under an Emergency Use Authorization (EUA). This EUA will remain in effect (meaning this test can be used) for the duration of the COVID-19 declaration under Section 564(b)(1) of the Act, 21  U.S.C. section 360bbb-3(b)(1), unless the authorization is terminated or revoked.  Performed at [facility], [address] [place], [place] [address]     Procedures/Studies: CT ABDOMEN PELVIS W CONTRAST Result Date: 07/24/2023 CLINICAL DATA:  Sepsis, UTI, altered mental status EXAM: CT ABDOMEN AND PELVIS WITH CONTRAST TECHNIQUE: Multidetector CT imaging of the abdomen and pelvis was performed using the standard protocol following bolus administration of intravenous contrast. RADIATION DOSE REDUCTION: This exam was performed according to the departmental dose-optimization program which includes automated exposure control, adjustment of the mA and/or kV according to patient size and/or use of iterative reconstruction technique. CONTRAST:  OMNIPAQUE  IOHEXOL  300 MG/ML  SOLN COMPARISON:  06/26/2023 FINDINGS: Lower chest: No acute abnormality. Hepatobiliary: Hepatic steatosis. Normal gallbladder. No biliary dilation. Pancreas: Unremarkable. Spleen: Unremarkable. Adrenals/Urinary Tract: Normal adrenal glands. No urinary calculi or hydronephrosis. Bladder is unremarkable. Stomach/Bowel: Normal caliber large and small bowel. No bowel wall thickening. The appendix is normal.Stomach is within normal limits. Vascular/Lymphatic: Aortic atherosclerosis. No enlarged abdominal or pelvic lymph nodes. Reproductive: Unremarkable. Other: No free intraperitoneal fluid or air. Musculoskeletal: No acute fracture. IMPRESSION: 1. No acute abnormality in the abdomen or pelvis. 2. Hepatic steatosis. 3. Aortic Atherosclerosis (ICD10-I70.0). Electronically Signed   By: Rozell Cornet M.D.   On: 07/24/2023 20:47   DG Chest Portable 1 View Result Date: 07/24/2023 CLINICAL DATA:  Fever. EXAM: PORTABLE CHEST 1 VIEW COMPARISON:  December 18, 2022. FINDINGS: Stable cardiomediastinal silhouette. Both lungs are clear. The visualized skeletal structures are unremarkable. IMPRESSION: No active disease.  Electronically Signed   By: Rosalene Colon M.D.   On: 07/24/2023 18:37    Labs: BNP (last 3 results) No results for input(s): "BNP" in the last 8760 hours. Basic Metabolic Panel: Recent Labs  Lab 07/25/23 0556 07/26/23 0538 07/27/23 0438  NA 136 132* 138  K 3.7 3.7 3.9  CL 99 101 104  CO2 24 21* 23  GLUCOSE 107* 106* 111*  BUN 14 19 28*  CREATININE 1.04* 1.12* 1.08*  CALCIUM 9.6 9.2 9.1  MG 1.8  --   --   PHOS 2.9  --   --    Liver Function Tests: Recent Labs  Lab 07/25/23 0556 07/26/23 0538 07/27/23 0438  AST 17 15 18   ALT 17 15 15   ALKPHOS 81 68 70  BILITOT 1.1 1.2 0.7  PROT 7.6 7.2 6.9  ALBUMIN 3.7 3.4* 3.2*   No results for input(s): "LIPASE", "AMYLASE" in the last 168 hours. Recent Labs  Lab 07/25/23 1025  AMMONIA 31   CBC: Recent Labs  Lab 07/24/23 1851 07/25/23 0556  WBC 7.2 6.3  NEUTROABS 5.3  --   HGB 14.8 14.4  HCT 46.1* 43.4  MCV 91.8 88.4  PLT 244 211   Cardiac Enzymes: No results for input(s): "CKTOTAL", "CKMB", "CKMBINDEX", "TROPONINI" in the last 168 hours. BNP: Invalid input(s): "POCBNP" CBG: No results for input(s): "GLUCAP" in the last 168 hours. D-Dimer No results for input(s): "DDIMER" in the last 72 hours. Hgb A1c No results for input(s): "HGBA1C" in the last 72 hours. Lipid Profile No results for input(s): "CHOL", "HDL", "LDLCALC", "TRIG", "CHOLHDL", "LDLDIRECT" in the last 72 hours. Thyroid  function studies Recent Labs    07/25/23 1025  TSH 1.006   Anemia work up Recent Labs    07/25/23 1025  VITAMINB12 327   Urinalysis    Component Value Date/Time   COLORURINE YELLOW 07/24/2023 1843   APPEARANCEUR CLEAR 07/24/2023 1843   LABSPEC 1.016 07/24/2023 1843   PHURINE 6.0 07/24/2023 1843   GLUCOSEU NEGATIVE 07/24/2023 1843   GLUCOSEU NEGATIVE 12/10/2017 0924   HGBUR NEGATIVE 07/24/2023 1843   BILIRUBINUR NEGATIVE 07/24/2023 1843   BILIRUBINUR Neg 02/18/2021 1455   KETONESUR NEGATIVE 07/24/2023 1843   PROTEINUR  NEGATIVE 07/24/2023 1843   UROBILINOGEN 0.2 02/18/2021 1455   UROBILINOGEN 0.2 12/10/2017 0924   NITRITE NEGATIVE 07/24/2023 1843   LEUKOCYTESUR NEGATIVE 07/24/2023 1843   Sepsis Labs Recent Labs  Lab 07/24/23 1851 07/25/23 0556  WBC 7.2 6.3   Microbiology Recent Results (from the past 240 hours)  Culture, blood (routine x 2)     Status: None (Preliminary result)   Collection Time: 07/24/23  7:00 PM   Specimen: BLOOD  Result Value Ref Range Status   Specimen Description   Final    BLOOD BLOOD RIGHT HAND Performed at [facility], [address], [place], [place] [address]    Special Requests   Final    BOTTLES DRAWN AEROBIC ONLY Blood Culture results may not be optimal due to an inadequate volume of blood received in culture bottles Performed at [facility], [address], [place], [place] [address]    Culture   Final    NO GROWTH 3 DAYS Performed at [facility], [address], [place], [place] [address]    Report Status PENDING  Incomplete  Resp panel by RT-PCR (RSV, Flu A&B, Covid) Anterior Nasal Swab     Status: Abnormal   Collection Time: 07/24/23  7:40 PM   Specimen: Anterior Nasal Swab  Result Value Ref Range Status   SARS Coronavirus 2 by RT PCR POSITIVE (A) NEGATIVE Final    Comment: (NOTE) SARS-CoV-2 target nucleic acids are DETECTED.  The SARS-CoV-2 RNA is generally detectable in upper respiratory specimens during the acute phase of infection. Positive results are indicative of the presence of the identified virus, but do not rule out bacterial infection or co-infection with other pathogens not detected by the test. Clinical correlation with patient history and other diagnostic information is necessary to determine patient infection status. The expected result is Negative.  Fact Sheet for Patients: BloggerCourse.com  Fact Sheet for Healthcare  Providers: SeriousBroker.it  This test is not yet approved or cleared by the [place]  FDA and  has been authorized for detection and/or diagnosis of SARS-CoV-2  by FDA under an Emergency Use Authorization (EUA).  This EUA will remain in effect (meaning this test can be used) for the duration of  the COVID-19 declaration under Section 564(b)(1) of the A ct, 21 U.S.C. section 360bbb-3(b)(1), unless the authorization is terminated or revoked sooner.     Influenza A by PCR NEGATIVE NEGATIVE Final   Influenza B by PCR NEGATIVE NEGATIVE Final    Comment: (NOTE) The Xpert Xpress SARS-CoV-2/FLU/RSV plus assay is intended as an aid in the diagnosis of influenza from Nasopharyngeal swab specimens and should not be used as a sole basis for treatment. Nasal washings and aspirates are unacceptable for Xpert Xpress SARS-CoV-2/FLU/RSV testing.  Fact Sheet for Patients: BloggerCourse.com  Fact Sheet for Healthcare Providers: SeriousBroker.it  This test is not yet approved or cleared by the [place]  FDA and has been authorized for detection and/or diagnosis of SARS-CoV-2 by FDA under an Emergency Use Authorization (EUA). This EUA will remain in effect (meaning this test can be used) for the duration of the COVID-19 declaration under Section 564(b)(1) of the Act, 21 U.S.C. section 360bbb-3(b)(1), unless the authorization is terminated or revoked.     Resp Syncytial Virus by PCR NEGATIVE NEGATIVE Final    Comment: (NOTE) Fact Sheet for Patients: BloggerCourse.com  Fact Sheet for Healthcare Providers: SeriousBroker.it  This test is not yet approved or cleared by the [place]  FDA and has been authorized for detection and/or diagnosis of SARS-CoV-2 by FDA under an Emergency Use Authorization (EUA). This EUA will remain in effect (meaning this test can be  used) for the duration of the COVID-19 declaration under Section 564(b)(1) of the Act, 21 U.S.C. section 360bbb-3(b)(1), unless the authorization is terminated or revoked.  Performed at [facility], [address], [place], [place] [address]      Time coordinating discharge: 35 minutes  SIGNED: Lesa Rape, MD  [facility] 07/27/2023, 12:00 PM  If 7PM-7AM, please contact night-coverage [email or web address]

## 2023-07-29 LAB — CULTURE, BLOOD (ROUTINE X 2): Culture: NO GROWTH

## 2023-07-30 DIAGNOSIS — U071 COVID-19: Secondary | ICD-10-CM | POA: Diagnosis not present

## 2023-07-30 DIAGNOSIS — E039 Hypothyroidism, unspecified: Secondary | ICD-10-CM | POA: Diagnosis not present

## 2023-07-30 DIAGNOSIS — N390 Urinary tract infection, site not specified: Secondary | ICD-10-CM | POA: Diagnosis not present

## 2023-07-30 DIAGNOSIS — I10 Essential (primary) hypertension: Secondary | ICD-10-CM | POA: Diagnosis not present

## 2023-07-30 DIAGNOSIS — G9341 Metabolic encephalopathy: Secondary | ICD-10-CM | POA: Diagnosis not present

## 2023-07-31 DIAGNOSIS — U071 COVID-19: Secondary | ICD-10-CM | POA: Diagnosis not present

## 2023-07-31 DIAGNOSIS — M6281 Muscle weakness (generalized): Secondary | ICD-10-CM | POA: Diagnosis not present

## 2023-07-31 DIAGNOSIS — G9340 Encephalopathy, unspecified: Secondary | ICD-10-CM | POA: Diagnosis not present

## 2023-07-31 DIAGNOSIS — Z741 Need for assistance with personal care: Secondary | ICD-10-CM | POA: Diagnosis not present

## 2023-08-01 DIAGNOSIS — E039 Hypothyroidism, unspecified: Secondary | ICD-10-CM | POA: Diagnosis not present

## 2023-08-01 DIAGNOSIS — N390 Urinary tract infection, site not specified: Secondary | ICD-10-CM | POA: Diagnosis not present

## 2023-08-01 DIAGNOSIS — U071 COVID-19: Secondary | ICD-10-CM | POA: Diagnosis not present

## 2023-08-01 DIAGNOSIS — I10 Essential (primary) hypertension: Secondary | ICD-10-CM | POA: Diagnosis not present

## 2023-08-01 DIAGNOSIS — G9341 Metabolic encephalopathy: Secondary | ICD-10-CM | POA: Diagnosis not present

## 2023-08-03 DIAGNOSIS — G9341 Metabolic encephalopathy: Secondary | ICD-10-CM | POA: Diagnosis not present

## 2023-08-03 DIAGNOSIS — E039 Hypothyroidism, unspecified: Secondary | ICD-10-CM | POA: Diagnosis not present

## 2023-08-03 DIAGNOSIS — N390 Urinary tract infection, site not specified: Secondary | ICD-10-CM | POA: Diagnosis not present

## 2023-08-03 DIAGNOSIS — I10 Essential (primary) hypertension: Secondary | ICD-10-CM | POA: Diagnosis not present

## 2023-08-03 DIAGNOSIS — U071 COVID-19: Secondary | ICD-10-CM | POA: Diagnosis not present

## 2023-08-06 ENCOUNTER — Telehealth: Payer: Self-pay

## 2023-08-06 NOTE — Transitions of Care (Post Inpatient/ED Visit) (Unsigned)
   08/06/2023  Name: Kari Coleman MRN: [id number] DOB: 1950-11-01  Today's TOC FU Call Status: Unsuccessful Call (1st Attempt) Date: 08/06/23  Attempted to reach the patient regarding the most recent Inpatient/ED visit.  Follow Up Plan: Additional outreach attempts will be made to reach the patient to complete the Transitions of Care (Post Inpatient/ED visit) call.   Signature Darrall Ellison, LPN [facility] Nurse Health Advisor Direct Dial [phone]

## 2023-08-07 NOTE — Transitions of Care (Post Inpatient/ED Visit) (Signed)
 08/07/2023  Name: Kari Coleman MRN: [id number] DOB: 05/05/1950  Today's TOC FU Call Status: Today's TOC FU Call Status:: Successful TOC FU Call Completed Unsuccessful Call (1st Attempt) Date: 08/06/23 [facility] FU Call Complete Date: 08/07/23 Patient's Name and Date of Birth confirmed.  Transition Care Management Follow-up Telephone Call Date of Discharge: 08/04/23 Discharge Facility: Other ([facility]) Name of Other ([facility]) Discharge Facility: [facility] Type of Discharge: Inpatient Admission Reason for ED Visit: Other: (TB) How have you been since you were released from the hospital?: Better Any questions or concerns?: No  Items Reviewed: Did you receive and understand the discharge instructions provided?: Yes Medications obtained,verified, and reconciled?: Yes (Medications Reviewed) Any new allergies since your discharge?: No Dietary orders reviewed?: Yes Do you have support at home?: Yes People in Home [RPT]: grandchild(ren), child(ren), adult  Medications Reviewed Today: Medications Reviewed Today     Reviewed by Darrall Ellison, LPN (Licensed Practical Nurse) on 08/07/23 at 1132  Med List Status: <None>   Medication Order Taking? Sig Documenting Provider Last Dose Status Informant  acetaminophen  (TYLENOL ) 325 MG tablet [id number] No Take 650 mg by mouth every 4 (four) hours as needed for mild pain or moderate pain. [provider] Past Month Active Child, Pharmacy Records  albuterol  (PROVENTIL  HFA) 108 (90 Base) MCG/ACT inhaler [id number] No Inhale 2 puffs into the lungs every 6 (six) hours as needed for wheezing or shortness of breath. Alexander Iba, [name] Past Week Active Child, Pharmacy Records  albuterol  (PROVENTIL ) (2.5 MG/3ML) 0.083% nebulizer solution [id number] No Take 3 mLs (2.5 mg total) by nebulization every 6 (six) hours as needed for wheezing or shortness of breath. Alexander Iba, PA Unknown Active Child, Pharmacy Records  amLODipine  (NORVASC )  10 MG tablet [id number] No Take 1 tablet (10 mg total) by mouth daily. Alexander Iba, [name] 07/24/2023 Active Child, Pharmacy Records  brimonidine  (ALPHAGAN ) 0.2 % ophthalmic solution [id number] No Place 1 drop into the right eye at bedtime. [provider] Past Week Active Child, Pharmacy Records           Med Note Verdia Glad, Ledon Pry   Wed Jul 25, 2023  9:23 AM)    Cholecalciferol  (VITAMIN D -3) 25 MCG (1000 UT) CAPS [id number] No Take 2,000 Units by mouth daily. [provider] Unknown Active Child, Pharmacy Records  cyanocobalamin  (VITAMIN B12) 1000 MCG tablet [id number] No Take 1 tablet (1,000 mcg total) by mouth daily. Vann, Jessica U, DO Unknown Active Child, Pharmacy Records  latanoprost  (XALATAN ) 0.005 % ophthalmic solution [id number] No Place 1 drop into both eyes at bedtime. [provider] Past Week Active Child, Pharmacy Records           Med Note Jane Meager Jul 25, 2023  9:24 AM)    levothyroxine  (SYNTHROID ) 100 MCG tablet [id number] No Take 1 tablet (100 mcg total) by mouth daily before breakfast. Emilie Harden, MD 07/24/2023 Active Child, Pharmacy Records  lisinopril  (ZESTRIL ) 40 MG tablet [id number] No TAKE 1 TABLET BY MOUTH DAILY Alexander Iba, PA Past Week Active Child, Pharmacy Records  metoprolol  tartrate (LOPRESSOR ) 25 MG tablet [id number] No Take 1 tablet (25 mg total) by mouth 2 (two) times daily. Alexander Iba, [name] 07/23/2023 Active Child, Pharmacy Records  ondansetron  (ZOFRAN ) 4 MG tablet [id number] No Take 1 tablet (4 mg total) by mouth every 6 (six) hours. Denese Finn, PA-C Unknown Active Child, Pharmacy Records  pantoprazole  (PROTONIX ) 40 MG tablet [id number] No Take 1 tablet (40 mg total) by mouth  daily. Alexander Iba, [name] Past Week Active Child, Pharmacy Records  polyethylene glycol (MIRALAX  / GLYCOLAX ) 17 g packet [id number] No Take 17 g by mouth daily.  Patient taking differently: Take 17 g by mouth daily as needed for mild  constipation (mix as directed).   Maylene Spear, MD Unknown Active Child, Pharmacy Records  pravastatin  (PRAVACHOL ) 10 MG tablet [id number] No TAKE 1 TABLET BY MOUTH DAILY Alexander Iba, PA Past Week Active Child, Pharmacy Records  senna-docusate (SENOKOT-S) 8.6-50 MG tablet [id number] No Take 2 tablets by mouth 2 (two) times daily.  Patient taking differently: Take 2 tablets by mouth 2 (two) times daily as needed for mild constipation or moderate constipation.   Maylene Spear, MD Unknown Active Child, Pharmacy Records  trimethoprim (TRIMPEX) 100 MG tablet [id number] No Take 100 mg by mouth daily. [provider] Past Week Active Child, Pharmacy Records            Home Care and Equipment/Supplies: Were Home Health Services Ordered?: Yes Name of Home Health Agency:: unknown Has Agency set up a time to come to your home?: No Any new equipment or medical supplies ordered?: NA  Functional Questionnaire: Do you need assistance with bathing/showering or dressing?: No Do you need assistance with meal preparation?: No Do you need assistance with eating?: No Do you have difficulty maintaining continence: No Do you need assistance with getting out of bed/getting out of a chair/moving?: No Do you have difficulty managing or taking your medications?: No  Follow up appointments reviewed: PCP Follow-up appointment confirmed?: Yes Date of PCP follow-up appointment?: 08/08/23 Follow-up Provider: [facility] Follow-up appointment confirmed?: NA Do you need transportation to your follow-up appointment?: No Do you understand care options if your condition(s) worsen?: Yes-patient verbalized understanding    SIGNATURE Darrall Ellison, LPN [facility] Nurse Health Advisor Direct Dial [phone]

## 2023-08-08 ENCOUNTER — Encounter: Payer: Self-pay | Admitting: Physician Assistant

## 2023-08-08 ENCOUNTER — Ambulatory Visit (INDEPENDENT_AMBULATORY_CARE_PROVIDER_SITE_OTHER): Admitting: Physician Assistant

## 2023-08-08 VITALS — BP 150/80 | HR 74 | Temp 98.2°F | Ht 67.5 in | Wt 207.2 lb

## 2023-08-08 DIAGNOSIS — N390 Urinary tract infection, site not specified: Secondary | ICD-10-CM

## 2023-08-08 DIAGNOSIS — I10 Essential (primary) hypertension: Secondary | ICD-10-CM

## 2023-08-08 DIAGNOSIS — R29898 Other symptoms and signs involving the musculoskeletal system: Secondary | ICD-10-CM

## 2023-08-08 LAB — CBC WITH DIFFERENTIAL/PLATELET
Basophils Absolute: 0.1 10*3/uL (ref 0.0–0.1)
Basophils Relative: 1 % (ref 0.0–3.0)
Eosinophils Absolute: 0.1 10*3/uL (ref 0.0–0.7)
Eosinophils Relative: 2.5 % (ref 0.0–5.0)
HCT: 41.2 % (ref 36.0–46.0)
Hemoglobin: 13.7 g/dL (ref 12.0–15.0)
Lymphocytes Relative: 37.2 % (ref 12.0–46.0)
Lymphs Abs: 2 10*3/uL (ref 0.7–4.0)
MCHC: 33.3 g/dL (ref 30.0–36.0)
MCV: 89.5 fl (ref 78.0–100.0)
Monocytes Absolute: 0.3 10*3/uL (ref 0.1–1.0)
Monocytes Relative: 4.9 % (ref 3.0–12.0)
Neutro Abs: 3 10*3/uL (ref 1.4–7.7)
Neutrophils Relative %: 54.4 % (ref 43.0–77.0)
Platelets: 314 10*3/uL (ref 150.0–400.0)
RBC: 4.6 Mil/uL (ref 3.87–5.11)
RDW: 12.9 % (ref 11.5–15.5)
WBC: 5.5 10*3/uL (ref 4.0–10.5)

## 2023-08-08 LAB — COMPREHENSIVE METABOLIC PANEL WITH GFR
ALT: 18 U/L (ref 0–35)
AST: 18 U/L (ref 0–37)
Albumin: 4.5 g/dL (ref 3.5–5.2)
Alkaline Phosphatase: 91 U/L (ref 39–117)
BUN: 24 mg/dL — ABNORMAL HIGH (ref 6–23)
CO2: 27 meq/L (ref 19–32)
Calcium: 10.2 mg/dL (ref 8.4–10.5)
Chloride: 102 meq/L (ref 96–112)
Creatinine, Ser: 1.05 mg/dL (ref 0.40–1.20)
GFR: 52.2 mL/min — ABNORMAL LOW (ref >60.00)
Glucose, Bld: 111 mg/dL — ABNORMAL HIGH (ref 70–99)
Potassium: 3.8 meq/L (ref 3.5–5.1)
Sodium: 137 meq/L (ref 135–145)
Total Bilirubin: 0.4 mg/dL (ref 0.2–1.2)
Total Protein: 8.2 g/dL (ref 6.0–8.3)

## 2023-08-08 NOTE — Patient Instructions (Signed)
 It was great to see you!  Your blood pressure is elevated in our office today.  I recommend that you monitor this at home.  Your goal blood pressure should be around < 130/80, unless you are over 75 years old, your goal may be closer to 140-150/90. Please note if you have been given other goals from a cardiologist or other healthcare provider, please defer to their recommendations.  When preparing to take your blood pressure: Plan ahead. Don't smoke, drink caffeine or exercise within 30 minutes before taking your blood pressure. Empty your bladder. Don't take the measurement over clothes. Remove the clothing over the arm that will be used to measure blood pressure. You can use either arm unless otherwise told by a healthcare provider. Usually there is not a big difference between readings on them. Be still. Allow at least five minutes of quiet rest before measurements. Don't talk or use the phone. Sit correctly. Sit with your back straight and supported (on a dining chair, rather than a sofa). Your feet should be flat on the floor. Do not cross your legs. Support your arm on a flat surface. The middle of the cuff should be placed on the upper arm at heart level.  Measure at the same time of the day. Take multiple readings and record the results. Each time you measure, take two readings one minute apart. Record the results and bring in to your next office visit.  In order to know how well the medication is working, I would like you to take your readings 1-2 hours after taking your blood pressure medication if possible. Take your blood pressure measurements and record 2-3 days per week.  If you get a high blood pressure reading: A single high reading is not an immediate cause for alarm. If you get a reading that is higher than normal, take your blood pressure a second time. Write down the results of both measurements. Check with your health care professional to see if there's a health concern or  whether there may be problems with your monitor. If your blood pressure readings are suddenly higher than 180/120 mm Hg, wait at least one minute and test again. If your readings are still very high, contact your health care professional immediately. You could be having a hypertensive crisis. Call 911 if your blood pressure is higher than 180/120 mm Hg and if you are having new signs or symptoms that may include: Chest pain Shortness of breath Back pain Numbness Weakness Change in vision Difficulty speaking Confusion Dizziness Vomiting       Let's follow-up in 3 months, sooner if you have concerns.  Take care,  Alexander Iba PA-C

## 2023-08-08 NOTE — Progress Notes (Signed)
 Kari Coleman is a 73 y.o. female here for a hospital follow-up.  History of Present Illness:   Chief Complaint  Patient presents with   Hospitalization Follow-up    Pt was admitted to the hospital on 4/22 for COVID.   Covid-19 : Pt presented to the ED with altered mental status and associated urinary frequency. Was recently treated for ESBL E. Coli and completed UTI antibiotics on 4/21.  Chest xr showed "no acute abnormality in the abdomen or pelvis, hepatic steatosis. Aortic atherosclerosis". While admitted, her mental status improved but remained weak. She was stable from a respiratory standpoint and was discharged .  She was discharged from the hospital and sent to a rehab facility.  She was admitted to [facility] on 4/25 and discharged on 5/2.  At discharge, it was recommended she get home health PT, occupational PT, and consider a nurse assistance for her personal care.  She reports being able to walk, shower, and use the bathroom independently as long as she takes her time.  Her most recent blood pressure reading was 128/63 on 4/28.  She remains complaint with all medications.  Her urinary symptoms have improved.  She notes she is usually symptomatic with her UTIs.  Reports drinking plenty of water  throughout the day.   She endorses fatigue and "feels down" today.   HTN Currently taking amlodipine  10 mg daily, lisinopril  40 mg daily, lopressor  25 mg twice daily. At home blood pressure readings are: not checked. Patient denies chest pain, SOB, blurred vision, dizziness, unusual headaches, lower leg swelling. Patient is not a reliable historian and is unable to verify if she is taking these medicationsDenies excessive caffeine intake, stimulant usage, excessive alcohol intake, or increase in salt consumption.  BP Readings from Last 3 Encounters:  08/08/23 (!) 150/80  07/27/23 (!) 163/121  06/26/23 115/64     Past Medical History:  Diagnosis Date   Allergy     Arthritis    Cataract    Cervical cancer (HCC)    GERD (gastroesophageal reflux disease)    Glaucoma 2017   History of echocardiogram    Echo 9/16:  Mild LVH, EF 55-60%, no RWMA, Gr 1 DD, mild MR // Echo 1/17:  Mild LVH, EF 50-55%, Gr 1 DD, mild to mod MR, mild LAE   Hypertension    Hyperthyroidism    s/p RAI treatment   Palpitations    PONV (postoperative nausea and vomiting)    years ago  . Woke up and saw asian people    PONV (postoperative nausea and vomiting) 07/18/2021   Retinal detachment    L eye - partial blindness   Tobacco abuse    Tuberculosis    positive test as a caregiver cxr annually    Urinary incontinence      Social History   Tobacco Use   Smoking status: Former    Current packs/day: 0.00    Average packs/day: 0.5 packs/day for 48.0 years (24.0 ttl pk-yrs)    Types: Cigarettes    Start date: 11/30/1968    Quit date: 10/31/2016    Years since quitting: 6.7   Smokeless tobacco: Never   Tobacco comments:    did not smoke more than .5 pack   Vaping Use   Vaping status: Never Used  Substance Use Topics   Alcohol use: Not Currently    Alcohol/week: 0.0 standard drinks of alcohol    Comment: rarely once a year   Drug use: No  Past Surgical History:  Procedure Laterality Date   ABDOMINAL HYSTERECTOMY  1984   bladder surgey for incontinence     EYE SURGERY     REPAIR OF COMPLEX TRACTION RETINAL DETACHMENT     thyroid  radiation     TOTAL ABDOMINAL HYSTERECTOMY W/ BILATERAL SALPINGOOPHORECTOMY     TOTAL KNEE ARTHROPLASTY Right 04/22/2019   Procedure: RIGHT TOTAL KNEE ARTHROPLASTY;  Surgeon: Dayne Even, MD;  Location: [facility];  Service: Orthopedics;  Laterality: Right;  PENDING PRE-OP FOR SDDC APPROVAL    Family History  Problem Relation Age of Onset   Hypertension Mother    Heart failure Mother    Diabetes Mother    Pancreatic cancer Father    Colon cancer Maternal Aunt    Cancer Sister    Thyroid  disease Paternal Aunt    Cancer Paternal Aunt     Thyroid  disease Other        paternal side   Cancer Maternal Grandfather    Diabetes Paternal Grandmother    Diabetes Maternal Uncle    Colon polyps Neg Hx    Stomach cancer Neg Hx     Allergies  Allergen Reactions   Doxycycline  Nausea Only   Codeine Nausea And Vomiting   Pollen Extract Itching and Other (See Comments)    Congestion, itchy eyes, runny nose    Current Medications:   Current Outpatient Medications:    acetaminophen  (TYLENOL ) 325 MG tablet, Take 650 mg by mouth every 4 (four) hours as needed for mild pain or moderate pain., Disp: , Rfl:    albuterol  (PROVENTIL  HFA) 108 (90 Base) MCG/ACT inhaler, Inhale 2 puffs into the lungs every 6 (six) hours as needed for wheezing or shortness of breath., Disp: 18 g, Rfl: 2   albuterol  (PROVENTIL ) (2.5 MG/3ML) 0.083% nebulizer solution, Take 3 mLs (2.5 mg total) by nebulization every 6 (six) hours as needed for wheezing or shortness of breath., Disp: 150 mL, Rfl: 1   amLODipine  (NORVASC ) 10 MG tablet, Take 1 tablet (10 mg total) by mouth daily., Disp: 90 tablet, Rfl: 1   brimonidine  (ALPHAGAN ) 0.2 % ophthalmic solution, Place 1 drop into the right eye at bedtime., Disp: , Rfl:    Cholecalciferol  (VITAMIN D -3) 25 MCG (1000 UT) CAPS, Take 2,000 Units by mouth daily., Disp: , Rfl:    cyanocobalamin  (VITAMIN B12) 1000 MCG tablet, Take 1 tablet (1,000 mcg total) by mouth daily., Disp: 30 tablet, Rfl: 2   latanoprost  (XALATAN ) 0.005 % ophthalmic solution, Place 1 drop into both eyes at bedtime., Disp: , Rfl:    levothyroxine  (SYNTHROID ) 100 MCG tablet, Take 1 tablet (100 mcg total) by mouth daily before breakfast., Disp: 90 tablet, Rfl: 1   lisinopril  (ZESTRIL ) 40 MG tablet, TAKE 1 TABLET BY MOUTH DAILY, Disp: 100 tablet, Rfl: 2   metoprolol  tartrate (LOPRESSOR ) 25 MG tablet, Take 1 tablet (25 mg total) by mouth 2 (two) times daily., Disp: 180 tablet, Rfl: 0   ondansetron  (ZOFRAN ) 4 MG tablet, Take 1 tablet (4 mg total) by mouth every 6  (six) hours., Disp: 12 tablet, Rfl: 0   pantoprazole  (PROTONIX ) 40 MG tablet, Take 1 tablet (40 mg total) by mouth daily., Disp: , Rfl:    polyethylene glycol (MIRALAX  / GLYCOLAX ) 17 g packet, Take 17 g by mouth daily. (Patient taking differently: Take 17 g by mouth daily as needed for mild constipation (mix as directed).), Disp: 14 each, Rfl: 0   pravastatin  (PRAVACHOL ) 10 MG tablet, TAKE 1 TABLET BY MOUTH DAILY,  Disp: 100 tablet, Rfl: 2   senna-docusate (SENOKOT-S) 8.6-50 MG tablet, Take 2 tablets by mouth 2 (two) times daily. (Patient taking differently: Take 2 tablets by mouth 2 (two) times daily as needed for mild constipation or moderate constipation.), Disp: 60 tablet, Rfl: 0   trimethoprim (TRIMPEX) 100 MG tablet, Take 100 mg by mouth daily. (Patient not taking: Reported on 08/08/2023), Disp: , Rfl:    Review of Systems:   Negative unless otherwise specified per HPI.  Vitals:   Vitals:   08/08/23 1045 08/08/23 1141  BP: (!) 150/86 (!) 150/80  Pulse: 74   Temp: 98.2 F (36.8 C)   TempSrc: Temporal   SpO2: 95%   Weight: 207 lb 4 oz (94 kg)   Height: 5' 7.5" (1.715 m)      Body mass index is 31.98 kg/m.  Physical Exam:   Physical Exam Vitals and nursing note reviewed.  Constitutional:      General: She is not in acute distress.    Appearance: She is well-developed. She is not ill-appearing or toxic-appearing.  Cardiovascular:     Rate and Rhythm: Normal rate and regular rhythm.     Pulses: Normal pulses.     Heart sounds: Normal heart sounds, S1 normal and S2 normal.  Pulmonary:     Effort: Pulmonary effort is normal.     Breath sounds: Normal breath sounds.  Skin:    General: Skin is warm and dry.  Neurological:     Mental Status: She is alert.     GCS: GCS eye subscore is 4. GCS verbal subscore is 5. GCS motor subscore is 6.  Psychiatric:        Speech: Speech normal.        Behavior: Behavior normal. Behavior is cooperative.     Assessment and Plan:   1.  Recurrent UTI (Primary) - Urinalysis, Routine w reflex microscopic; Future - Urine Culture; Future  Denies any symptom(s)  Will recheck urinalysis today and provide recommendations Continue efforts at adequate hydration Follow up as needed   2. Essential hypertension - CBC with Differential/Platelet - Comprehensive metabolic panel with GFR  Above goal today No evidence of end-organ damage on my exam Recommend patient monitor home blood pressure at least a few times weekly Continue amlodipine  10 mg daily, lisinopril  40 mg daily, lopressor  25 mg twice daily mg daily If home monitoring shows consistent elevation, or any symptom(s) develop, recommend reach out to us  for further advice on next steps Follow up in 3 month(s)   3. Muscular deconditioning Ongoing issue Patient declines PT or occupational therapy Continue efforts at compliance of care and regularly attending appointments  I, Bernita Bristle, acting as a scribe for Alexander Iba, [name]., have documented all relevant documentation on the behalf of Alexander Iba, [name], as directed by   while in the presence of Alexander Iba, [name].  I, Alexander Iba, [name], have reviewed all documentation for this visit. The documentation on 08/08/23 for the exam, diagnosis, procedures, and orders are all accurate and complete.  Alexander Iba, PA-C

## 2023-08-14 ENCOUNTER — Other Ambulatory Visit (INDEPENDENT_AMBULATORY_CARE_PROVIDER_SITE_OTHER)

## 2023-08-14 DIAGNOSIS — N390 Urinary tract infection, site not specified: Secondary | ICD-10-CM | POA: Diagnosis not present

## 2023-08-15 LAB — URINALYSIS, ROUTINE W REFLEX MICROSCOPIC
Bilirubin Urine: NEGATIVE
Hgb urine dipstick: NEGATIVE
Ketones, ur: NEGATIVE
Leukocytes,Ua: NEGATIVE
Nitrite: NEGATIVE
RBC / HPF: NONE SEEN (ref 0–?)
Specific Gravity, Urine: 1.025 (ref 1.000–1.030)
Total Protein, Urine: NEGATIVE
Urine Glucose: NEGATIVE
Urobilinogen, UA: 0.2 (ref 0.0–1.0)
pH: 6 (ref 5.0–8.0)

## 2023-08-17 ENCOUNTER — Ambulatory Visit: Payer: Self-pay | Admitting: Physician Assistant

## 2023-08-17 LAB — URINE CULTURE
MICRO NUMBER:: 16448912
SPECIMEN QUALITY:: ADEQUATE

## 2023-09-01 ENCOUNTER — Other Ambulatory Visit: Payer: Self-pay | Admitting: Physician Assistant

## 2023-09-19 ENCOUNTER — Ambulatory Visit: Admitting: Physician Assistant

## 2023-09-25 ENCOUNTER — Ambulatory Visit (INDEPENDENT_AMBULATORY_CARE_PROVIDER_SITE_OTHER): Admitting: Family Medicine

## 2023-09-25 ENCOUNTER — Encounter: Payer: Self-pay | Admitting: Family Medicine

## 2023-09-25 ENCOUNTER — Ambulatory Visit: Payer: Self-pay

## 2023-09-25 VITALS — BP 136/84 | HR 67 | Temp 98.1°F | Ht 67.5 in | Wt 211.2 lb

## 2023-09-25 DIAGNOSIS — I10 Essential (primary) hypertension: Secondary | ICD-10-CM | POA: Diagnosis not present

## 2023-09-25 DIAGNOSIS — R600 Localized edema: Secondary | ICD-10-CM | POA: Diagnosis not present

## 2023-09-25 LAB — CBC
HCT: 40.1 % (ref 36.0–46.0)
Hemoglobin: 13.7 g/dL (ref 12.0–15.0)
MCHC: 34.2 g/dL (ref 30.0–36.0)
MCV: 86.8 fl (ref 78.0–100.0)
Platelets: 260 10*3/uL (ref 150.0–400.0)
RBC: 4.61 Mil/uL (ref 3.87–5.11)
RDW: 13 % (ref 11.5–15.5)
WBC: 5.7 10*3/uL (ref 4.0–10.5)

## 2023-09-25 LAB — TSH: TSH: 0.4 u[IU]/mL (ref 0.35–5.50)

## 2023-09-25 LAB — COMPREHENSIVE METABOLIC PANEL WITH GFR
ALT: 17 U/L (ref 0–35)
AST: 19 U/L (ref 0–37)
Albumin: 4.1 g/dL (ref 3.5–5.2)
Alkaline Phosphatase: 98 U/L (ref 39–117)
BUN: 17 mg/dL (ref 6–23)
CO2: 27 meq/L (ref 19–32)
Calcium: 9.9 mg/dL (ref 8.4–10.5)
Chloride: 104 meq/L (ref 96–112)
Creatinine, Ser: 1.04 mg/dL (ref 0.40–1.20)
GFR: 52.76 mL/min — ABNORMAL LOW (ref >60.00)
Glucose, Bld: 100 mg/dL — ABNORMAL HIGH (ref 70–99)
Potassium: 3.8 meq/L (ref 3.5–5.1)
Sodium: 139 meq/L (ref 135–145)
Total Bilirubin: 0.4 mg/dL (ref 0.2–1.2)
Total Protein: 7.8 g/dL (ref 6.0–8.3)

## 2023-09-25 NOTE — Progress Notes (Signed)
   Kari Coleman is a 73 y.o. female who presents today for an office visit.  Assessment/Plan:  Pedal Edema  No red flags.  Likely multifactorial in setting of venous insufficiency, recent increase salt load, and amlodipine .  Will check labs today to rule out other potential etiologies.  Overall low suspicion for DVT though her Wells score is 1 due to prior documented DVT.  We will check D-dimer to rule this out.  Recommended that she keep legs elevated and avoid salt.  Also recommended decreasing her amlodipine  to 5 mg daily.  She will follow-up with me or her PCP in a week.  We discussed reasons to return to care.  Essential Hypertension  At goal today on current regimen amlodipine  10 mg daily, lisinopril  40 mg daily, metoprolol  tartrate 25 mg twice daily.  Amlodipine  may be contributing to her above pedal edema.  Recommend she decrease to 5 mg daily.  She will monitor her blood pressure at home and follow-up with us  next week.    Subjective:  HPI:  See A/P for status of chronic conditions.  Patient is here today with swelling in her right foot.  She has a moderate amount of pain to the area.  No redness.  No injuries or precipitating events.  She did have a DVT last year. She did have some pain in her right leg a couple of days ago but that as since resolved. No obvious precipitating events.  No reported chest pain or shortness of breath.  She did recently celebrate her birthday and does think that she may have had more sodium intake than typical.       Objective:  Physical Exam: BP 136/84   Pulse 67   Temp 98.1 F (36.7 C) (Temporal)   Ht 5' 7.5 (1.715 m)   Wt 211 lb 3.2 oz (95.8 kg)   SpO2 98%   BMI 32.59 kg/m   Gen: No acute distress, resting comfortably CV: Regular rate and rhythm with no murmurs appreciated Pulm: Normal work of breathing, clear to auscultation bilaterally with no crackles, wheezes, or rhonchi MUSCULOSKELETAL - Legs: No deformities.  Trace pitting edema noted  bilaterally right worse than left confined to dorsal foot.  No pretibial edema noted.  Neurovascular intact distally. Neuro: Grossly normal, moves all extremities Psych: Normal affect and thought content      Ashrith Sagan M. Kennyth, MD 09/25/2023 2:16 PM

## 2023-09-25 NOTE — Telephone Encounter (Signed)
 Copied from CRM [id number]. Topic: Clinical - Red Word Triage >> Sep 25, 2023  9:56 AM Aleatha BROCKS wrote: Red Word that prompted transfer to Nurse Triage: feet swollen since yesterday Answer Assessment - Initial Assessment Questions 1. ONSET: When did the swelling start? (e.g., minutes, hours, days)     Yesterday 2. LOCATION: What part of the leg is swollen?  Are both legs swollen or just one leg?     right 3. SEVERITY: How bad is the swelling? (e.g., localized; mild, moderate, severe)   - Localized: Small area of swelling localized to one leg.   - MILD pedal edema: Swelling limited to foot and ankle, pitting edema < 1/4 inch (6 mm) deep, rest and elevation eliminate most or all swelling.   - MODERATE edema: Swelling of lower leg to knee, pitting edema > 1/4 inch (6 mm) deep, rest and elevation only partially reduce swelling.   - SEVERE edema: Swelling extends above knee, facial or hand swelling present.      mild 4. REDNESS: Does the swelling look red or infected?     no 5. PAIN: Is the swelling painful to touch? If Yes, ask: How painful is it?   (Scale 1-10; mild, moderate or severe)     moderate 6. FEVER: Do you have a fever? If Yes, ask: What is it, how was it measured, and when did it start?      no 7. CAUSE: What do you think is causing the leg swelling?     unsure 8. MEDICAL HISTORY: Do you have a history of blood clots (e.g., DVT), cancer, heart failure, kidney disease, or liver failure?     Yes DVT last year 9. RECURRENT SYMPTOM: Have you had leg swelling before? If Yes, ask: When was the last time? What happened that time?     yes 10. OTHER SYMPTOMS: Do you have any other symptoms? (e.g., chest pain, difficulty breathing)       no 11. PREGNANCY: Is there any chance you are pregnant? When was your last menstrual period?       no  Protocols used: Leg Swelling and Edema-A-AH  Reason for Disposition . [1] Thigh, calf, or ankle swelling AND [2]  bilateral AND [3] 1 side is more swollen  Answer Assessment - Initial Assessment Questions 1. ONSET: When did the swelling start? (e.g., minutes, hours, days)     Yesterday 2. LOCATION: What part of the leg is swollen?  Are both legs swollen or just one leg?     right 3. SEVERITY: How bad is the swelling? (e.g., localized; mild, moderate, severe)   - Localized: Small area of swelling localized to one leg.   - MILD pedal edema: Swelling limited to foot and ankle, pitting edema < 1/4 inch (6 mm) deep, rest and elevation eliminate most or all swelling.   - MODERATE edema: Swelling of lower leg to knee, pitting edema > 1/4 inch (6 mm) deep, rest and elevation only partially reduce swelling.   - SEVERE edema: Swelling extends above knee, facial or hand swelling present.      mild 4. REDNESS: Does the swelling look red or infected?     no 5. PAIN: Is the swelling painful to touch? If Yes, ask: How painful is it?   (Scale 1-10; mild, moderate or severe)     moderate 6. FEVER: Do you have a fever? If Yes, ask: What is it, how was it measured, and when did it start?  no 7. CAUSE: What do you think is causing the leg swelling?     unsure 8. MEDICAL HISTORY: Do you have a history of blood clots (e.g., DVT), cancer, heart failure, kidney disease, or liver failure?     Yes DVT last year 9. RECURRENT SYMPTOM: Have you had leg swelling before? If Yes, ask: When was the last time? What happened that time?     yes 10. OTHER SYMPTOMS: Do you have any other symptoms? (e.g., chest pain, difficulty breathing)       no 11. PREGNANCY: Is there any chance you are pregnant? When was your last menstrual period?       no  Protocols used: Leg Swelling and Edema-A-AH

## 2023-09-25 NOTE — Patient Instructions (Addendum)
 It was very nice to see you today!  I think your swelling is probably due to the recent salt increase.  Your amlodipine  is also contributing to this.  We will check blood work today.  Please decrease your amlodipine  to 5 mg daily.  Please continue to work on salt avoidance and keeping your legs elevated.  Return in about 1 week (around 10/02/2023).   Take care, Dr Kennyth  PLEASE NOTE:  If you had any lab tests, please let us  know if you have not heard back within a few days. You may see your results on mychart before we have a chance to review them but we will give you a call once they are reviewed by us .   If we ordered any referrals today, please let us  know if you have not heard from their office within the next week.   If you had any urgent prescriptions sent in today, please check with the pharmacy within an hour of our visit to make sure the prescription was transmitted appropriately.   Please try these tips to maintain a healthy lifestyle:  Eat at least 3 REAL meals and 1-2 snacks per day.  Aim for no more than 5 hours between eating.  If you eat breakfast, please do so within one hour of getting up.   Each meal should contain half fruits/vegetables, one quarter protein, and one quarter carbs (no bigger than a computer mouse)  Cut down on sweet beverages. This includes juice, soda, and sweet tea.   Drink at least 1 glass of water  with each meal and aim for at least 8 glasses per day  Exercise at least 150 minutes every week.

## 2023-09-25 NOTE — Telephone Encounter (Signed)
 FYI Only or Action Required?: Action required by provider: request for appointment.  Patient was last seen in primary care on 08/08/2023 by Job Lukes, PA. Called Nurse Triage reporting Foot Swelling. Symptoms began yesterday. Interventions attempted: Nothing. Symptoms are: unchanged.Right foot swelling, history of DVT.   Triage Disposition: See HCP Within 4 Hours (Or PCP Triage)  Patient/caregiver understands and will follow disposition?: Yes

## 2023-09-25 NOTE — Telephone Encounter (Signed)
 FYI  Please see below

## 2023-09-26 ENCOUNTER — Ambulatory Visit (HOSPITAL_COMMUNITY)
Admission: RE | Admit: 2023-09-26 | Discharge: 2023-09-26 | Disposition: A | Discharge status: Home / Self Care | Source: Ambulatory Visit | Attending: Family Medicine | Admitting: Family Medicine

## 2023-09-26 ENCOUNTER — Telehealth: Payer: Self-pay | Admitting: Physician Assistant

## 2023-09-26 ENCOUNTER — Ambulatory Visit: Payer: Self-pay | Admitting: Family Medicine

## 2023-09-26 DIAGNOSIS — R7989 Other specified abnormal findings of blood chemistry: Secondary | ICD-10-CM

## 2023-09-26 LAB — D-DIMER, QUANTITATIVE: D-Dimer, Quant: 1.75 ug{FEU}/mL — ABNORMAL HIGH (ref <0.50)

## 2023-09-26 NOTE — Telephone Encounter (Unsigned)
 Copied from CRM [id number]. Topic: Clinical - Lab/Test Results >> Sep 26, 2023 10:42 AM Ernestene P wrote: Reason for CRM: pt returning call, relayed results to pt, but she had additional questions about DVT. Pt would like to be reached [phone]

## 2023-09-26 NOTE — Progress Notes (Signed)
 Her D-dimer is elevated.  This may be related to her chronic medical conditions however it would be a good idea if we were to check an ultrasound to make sure that she does not have any recurrence of DVT.  Please place order for lower extremity ultrasound to rule out DVT.  The rest of her labs are all stable.

## 2023-09-27 ENCOUNTER — Ambulatory Visit: Payer: Self-pay | Admitting: Family Medicine

## 2023-09-27 NOTE — Progress Notes (Signed)
 Great news!  Her ultrasound did not show blood clot.  As we discussed at her office visit her swelling is probably due to extra fluid retention due to sodium and potentially the amlodipine  that we decrease the dose on.  I would like for her to follow-up with me or her PCP in the next week or so.

## 2023-10-16 DIAGNOSIS — T83110A Breakdown (mechanical) of urinary electronic stimulator device, initial encounter: Secondary | ICD-10-CM | POA: Diagnosis not present

## 2023-10-16 DIAGNOSIS — T85193A Other mechanical complication of implanted electronic neurostimulator, generator, initial encounter: Secondary | ICD-10-CM | POA: Diagnosis not present

## 2023-11-06 ENCOUNTER — Other Ambulatory Visit: Payer: Self-pay | Admitting: Physician Assistant

## 2023-11-06 MED ORDER — METOPROLOL TARTRATE 25 MG PO TABS: 25.0000 mg | ORAL_TABLET | Freq: Two times a day (BID) | ORAL | 0 refills | Status: DC

## 2023-11-06 NOTE — Telephone Encounter (Signed)
 Copied from CRM [id number]. Topic: Clinical - Medication Refill >> Nov 06, 2023  3:07 PM Jasmin G wrote: Medication: metoprolol  tartrate (LOPRESSOR ) 25 MG tablet,  pantoprazole  (PROTONIX ) 40 MG tablet  Has the patient contacted their pharmacy? Yes (Agent: If no, request that the patient contact the pharmacy for the refill. If patient does not wish to contact the pharmacy document the reason why and proceed with request.) (Agent: If yes, when and what did the pharmacy advise?)  This is the patient's preferred pharmacy:  [facility] [address] [place] - [address] [place] [address] Phone: [phone] Fax: [phone]  Is this the correct pharmacy for this prescription? Yes If no, delete pharmacy and type the correct one.   Has the prescription been filled recently? No  Is the patient out of the medication? Yes  Has the patient been seen for an appointment in the last year OR does the patient have an upcoming appointment? Yes  Can we respond through MyChart? No  Agent: Please be advised that Rx refills may take up to 3 business days. We ask that you follow-up with your pharmacy.

## 2023-11-20 ENCOUNTER — Ambulatory Visit (INDEPENDENT_AMBULATORY_CARE_PROVIDER_SITE_OTHER): Payer: Medicare Other | Admitting: Internal Medicine

## 2023-11-20 ENCOUNTER — Encounter: Payer: Self-pay | Admitting: Internal Medicine

## 2023-11-20 VITALS — BP 130/80 | HR 70 | Ht 67.5 in | Wt 215.0 lb

## 2023-11-20 DIAGNOSIS — E042 Nontoxic multinodular goiter: Secondary | ICD-10-CM | POA: Diagnosis not present

## 2023-11-20 DIAGNOSIS — E890 Postprocedural hypothyroidism: Secondary | ICD-10-CM

## 2023-11-20 DIAGNOSIS — N3000 Acute cystitis without hematuria: Secondary | ICD-10-CM | POA: Diagnosis not present

## 2023-11-20 LAB — TSH: TSH: 0.24 m[IU]/L — ABNORMAL LOW (ref 0.40–4.50)

## 2023-11-20 LAB — T4, FREE: Free T4: 1.2 ng/dL (ref 0.8–1.8)

## 2023-11-20 NOTE — Progress Notes (Signed)
 Patient ID: Kari Coleman, female   DOB: [birth year], 73 y.o.   MRN: [id number]  HPI  Kari Coleman is a 73 y.o.-year-old female, returning for follow-up for post ablative hypothyroidism for left toxic adenoma and multinodular goiter.  She previously saw Dr. Kassie, but last visit with me 6 months ago.  Interim history: No weight loss, palpitations, tremors.  She describes occasional hot flashes, but these are not new. She has L shoulder pain radiating up the neck. She also has back pain and uses a walker.  She had physical therapy. She recently had COVID-19 in 07/2023.  This resolved.  No shortness of breath. She has recurrent UTIs - treated.  Reviewed hx: Pt. has been dx with a toxic multinodular goiter in 2013.  On the thyroid  uptake and scan, she had a left toxic adenoma.  She had RAI treatment in 07/2018 and was started on levothyroxine  subsequently. Her levothyroxine  dose was gradually decreased from 175 mcg daily to 125 and the 100 mcg daily (last dose change 01/2023)  She takes the levothyroxine  100 mcg daily: - in am - NOT always fasting 30 min from b'fast >> now breakfast moved 30 minutes later - no calcium - no iron - no multivitamins - + Protonix  ~1h after LT4 >> now in the evening >> moved to lunchtime (she is out now) - not on Biotin On Vitamin D , B12.  I reviewed pt's thyroid  tests: Lab Results  Component Value Date   TSH 0.40 09/25/2023   TSH 1.006 07/25/2023   TSH 2.33 05/22/2023   TSH 0.55 04/11/2023   TSH 0.04 (L) 01/18/2023   TSH 0.048 (L) 12/19/2022   TSH <0.01 repeated and verified (L) 06/09/2022   TSH 0.99 08/12/2021   TSH 1.30 01/26/2021   TSH 10.12 (H) 08/26/2020   FREET4 1.1 05/22/2023   FREET4 0.85 04/11/2023   FREET4 1.33 01/18/2023   FREET4 1.51 06/09/2022   FREET4 1.05 01/26/2021   FREET4 0.52 (L) 08/26/2020   FREET4 0.80 05/27/2020   FREET4 0.61 01/22/2020   FREET4 0.75 10/21/2019   FREET4 0.58 (L) 07/11/2019   T3FREE 5.1 (H) 06/11/2017    Antithyroid antibodies: No results found for: THGAB No components found for: TPOAB  Thyroid  U/S (03/26/2014): There is marked diffuse heterogeneity of thyroid  parenchymal echotexture   Right thyroid  lobe.: Normal in size measuring 4.7 x 2.4 x 2.5 cm.   Right, mid - 2.5 x 1.5 x 1.8 cm - mixed echogenic, partially cystic partially solid, slightly ill-defined, possibly a pseudo nodule.  There are innumerable additional punctate sub cm mixed echogenic though predominantly cystic nodules scattered throughout the remainder of the right lobe of the thyroid .   Left thyroid  lobe: Borderline enlarged measuring 6.1 x 2.5 x 2.4 cm.   Left, inferior, anterior - 1.9 x 1.3 x 1.9 cm - anechoic, likely cystic.  *Left, superior, anterior - 2.2 x 1.2 x 1.6 cm - mixed echogenic, partially cystic, predominantly solid.  There are innumerable additional punctate sub cm mixed echogenic though predominantly cystic nodules scattered throughout the remainder of the left lobe of the thyroid .   Isthmus Thickness: Enlarged measuring 0.5 cm in diameter.   Mid, anterior - 0.5 cm - hypoechoic, likely cystic.  There are 2 additional < 3 mm anechoic cysts within the thyroid  isthmus.   Lymphadenopathy: None visualized.   IMPRESSION: Findings compatible with multi nodular goiter. The dominant approximately 2.5 cm nodule within the mid aspect of the right lobe of the thyroid  as well  as the dominant approximately 2.2 cm nodule within the superior, anterior aspect the left lobe of the thyroid  both meet imaging criteria to recommend percutaneous sampling as clinically indicated.   Thyroid  FNA (06/16/2014): Adequacy Reason Satisfactory For Evaluation. Diagnosis THYROID , FINE NEEDLE ASPIRATION, LEFT LOBE LUP, ANTERIOR, SUPERIOR (SPECIMEN 1 OF 2 COLLECTED ON 06/16/14): ATYPIA. (BETHESDA CATEGORY III). SEE COMMENT. NORLEEN DOVER MD Pathologist, Electronic Signature (Case signed 06/17/2014) Specimen  Clinical Information Left, Superior, Anterior - 2.2 x 1.2 x 1.6 cm - mixed echogenic, Partially cystic, Predominantly solid Source Thyroid , Fine Needle Aspiration, Left Lobe LUP, Anterior, Superior, (Specimen 1 of 2, collected on 06/16/14 )  Adequacy Reason Satisfactory For Evaluation. Diagnosis THYROID , FINE NEEDLE ASPIRATION, RIGHT LOBE RMP (SPECIMEN 2 OF 2 COLLECTED 06-16-2014) BENIGN (BETHESDA CATEGORY II). FINDINGS CONSISTENT WITH BENIGN CYSTIC NODULE. NORLEEN DOVER MD Pathologist, Electronic Signature (Case signed 06/17/2014) Specimen Clinical Information Right, Mid - 2.5 x 1.5 x 1.8 cm - mixed echogenic , Partially cystic partially solid, Slightly ill - defiined, Possibly a pseudo nodule Source Thyroid , Fine Needle Aspiration, Right Lobe RMP, (Specimen 2 of 2, collected on 06/16/14 )  Thyroid  uptake and scan (05/16/2018): 4 hour I-123 uptake = 21.5% (normal 5-20%)  24 hour I-123 uptake = 49.8% (normal 10-30%)   IMPRESSION: Elevated 4 hour and 24 hour radio iodine  uptakes as above consistent with hyperthyroidism.   When compared to the previous study, the 24 hour radio iodine  uptake has increased from the 23.8% on the previous exam.   Homogeneous tracer distribution in both thyroid  lobes with suspect mildly hyperfunctional adenoma at the upper pole the LEFT lobe.  RAI tx (07/26/2018)  Thyroid  U/S (02/06/2020): Parenchymal Echotexture: Markedly heterogenous  Isthmus: 0.4 cm  Right lobe: 3.7 x 1.4 x 1.1 cm  Left lobe: 3.8 x 1.3 x 1.4 cm  _________________________________________________________   Estimated total number of nodules >/= 1 cm: 2 _________________________________________________________   Nodule # 1: A solid isoechoic nodule in the right upper gland measures 1.3 x 0.9 x 0.6 cm. This nodule does not meet criteria for biopsy or further imaging surveillance. Of note, it is in similar location to the previously biopsied spongiform thyroid  nodule and likely  represents the involuted remnants of that prior nodule.   Several small nodules are present in the left upper and lower gland. The nodules are either less than 1 cm in size or purely cystic and do not meet criteria for biopsy or further imaging evaluation.   No thyroid  nodules of consequence are identified.   IMPRESSION: Small, heterogeneous thyroid  gland with multiple small nodules which do not meet criteria for biopsy or further imaging surveillance. No further follow-up is required.  Pt denies: - feeling nodules in neck - hoarseness - dysphagia, only occasionally with water  - choking  She has + FH of thyroid  disorders in many family members. No FH of thyroid  cancer. No h/o radiation tx to head or neck. No recent use of iodine  supplements.  No herbal supplements.  No recent steroid use.  Pt. 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 She has a history of PE in 12/2021.  ROS: + see HPI  Past Medical History:  Diagnosis Date   Allergy    Arthritis    Cataract    Cervical cancer (HCC)    GERD (gastroesophageal reflux disease)    Glaucoma 2017   History of echocardiogram    Echo 9/16:  Mild LVH, EF  55-60%, no RWMA, Gr 1 DD, mild MR // Echo 1/17:  Mild LVH, EF 50-55%, Gr 1 DD, mild to mod MR, mild LAE   Hypertension    Hyperthyroidism    s/p RAI treatment   Palpitations    PONV (postoperative nausea and vomiting)    years ago  . Woke up and saw asian people    PONV (postoperative nausea and vomiting) 07/18/2021   Retinal detachment    L eye - partial blindness   Tobacco abuse    Tuberculosis    positive test as a caregiver cxr annually    Urinary incontinence    Past Surgical History:  Procedure Laterality Date   ABDOMINAL HYSTERECTOMY  1984   bladder surgey for incontinence     EYE SURGERY     REPAIR OF COMPLEX TRACTION RETINAL DETACHMENT     thyroid  radiation      TOTAL ABDOMINAL HYSTERECTOMY W/ BILATERAL SALPINGOOPHORECTOMY     TOTAL KNEE ARTHROPLASTY Right 04/22/2019   Procedure: RIGHT TOTAL KNEE ARTHROPLASTY;  Surgeon: Sheril Coy, MD;  Location: [facility];  Service: Orthopedics;  Laterality: Right;  PENDING PRE-OP FOR SDDC APPROVAL   Social History   Socioeconomic History   Marital status: Single    Spouse name: Not on file   Number of children: 2   Years of education: Not on file   Highest education level: Not on file  Occupational History   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   Occupation: Retired  Tobacco Use   Smoking status: Former    Current packs/day: 0.00    Average packs/day: 0.5 packs/day for 48.0 years (24.0 ttl pk-yrs)    Types: Cigarettes    Start date: 11/30/1968    Quit date: 10/31/2016    Years since quitting: 7.0   Smokeless tobacco: Never   Tobacco comments:    did not smoke more than .5 pack   Vaping Use   Vaping status: Never Used  Substance and Sexual Activity   Alcohol use: Not Currently    Alcohol/week: 0.0 standard drinks of alcohol    Comment: rarely once a year   Drug use: No   Sexual activity: Not Currently  Other Topics Concern   Not on file  Social History Narrative   Caregiver   Daughter and grandson in household   Social Drivers of Health   Financial Resource Strain: Low Risk  (12/12/2021)   Overall Financial Resource Strain (CARDIA)    Difficulty of Paying Living Expenses: Not hard at all  Food Insecurity: No Food Insecurity (07/25/2023)   Hunger Vital Sign    Worried About Running Out of Food in the Last Year: Never true    Ran Out of Food in the Last Year: Never true  Transportation Needs: No Transportation Needs (07/25/2023)   PRAPARE - Administrator, Civil Service (Medical): No    Lack of Transportation (Non-Medical): No  Physical Activity: Inactive (12/12/2021)   Exercise Vital Sign    Days of Exercise per Week: 0 days    Minutes of Exercise per Session: 0 min   Stress: No Stress Concern Present (12/12/2021)   [employer] of Occupational Health - Occupational Stress Questionnaire    Feeling of Stress : Not at all  Social Connections: Moderately Isolated (07/25/2023)   Social Connection and Isolation Panel    Frequency of Communication with Friends and Family: More than three times a week    Frequency of Social Gatherings with Friends  and Family: Twice a week    Attends Religious Services: 1 to 4 times per year    Active Member of Clubs or Organizations: No    Attends Banker Meetings: Never    Marital Status: Never married  Intimate Partner Violence: Not At Risk (07/25/2023)   Humiliation, Afraid, Rape, and Kick questionnaire    Fear of Current or Ex-Partner: No    Emotionally Abused: No    Physically Abused: No    Sexually Abused: No   Current Outpatient Medications on File Prior to Visit  Medication Sig Dispense Refill   acetaminophen  (TYLENOL ) 325 MG tablet Take 650 mg by mouth every 4 (four) hours as needed for mild pain or moderate pain.     albuterol  (PROVENTIL  HFA) 108 (90 Base) MCG/ACT inhaler Inhale 2 puffs into the lungs every 6 (six) hours as needed for wheezing or shortness of breath. 18 g 2   albuterol  (PROVENTIL ) (2.5 MG/3ML) 0.083% nebulizer solution Take 3 mLs (2.5 mg total) by nebulization every 6 (six) hours as needed for wheezing or shortness of breath. 150 mL 1   amLODipine  (NORVASC ) 10 MG tablet Take 1 tablet (10 mg total) by mouth daily. 90 tablet 1   brimonidine  (ALPHAGAN ) 0.2 % ophthalmic solution Place 1 drop into the right eye at bedtime.     Cholecalciferol  (VITAMIN D -3) 25 MCG (1000 UT) CAPS Take 2,000 Units by mouth daily.     cyanocobalamin  (VITAMIN B12) 1000 MCG tablet Take 1 tablet (1,000 mcg total) by mouth daily. 30 tablet 2   latanoprost  (XALATAN ) 0.005 % ophthalmic solution Place 1 drop into both eyes at bedtime.     levothyroxine  (SYNTHROID ) 100 MCG tablet Take 1 tablet (100 mcg total) by  mouth daily before breakfast. 90 tablet 1   lisinopril  (ZESTRIL ) 40 MG tablet TAKE 1 TABLET BY MOUTH DAILY 100 tablet 2   metoprolol  tartrate (LOPRESSOR ) 25 MG tablet Take 1 tablet (25 mg total) by mouth 2 (two) times daily. 180 tablet 0   ondansetron  (ZOFRAN ) 4 MG tablet Take 1 tablet (4 mg total) by mouth every 6 (six) hours. 12 tablet 0   pantoprazole  (PROTONIX ) 40 MG tablet Take 1 tablet (40 mg total) by mouth daily.     polyethylene glycol (MIRALAX  / GLYCOLAX ) 17 g packet Take 17 g by mouth daily. (Patient taking differently: Take 17 g by mouth daily as needed for mild constipation (mix as directed).) 14 each 0   pravastatin  (PRAVACHOL ) 10 MG tablet TAKE 1 TABLET BY MOUTH DAILY 100 tablet 2   senna-docusate (SENOKOT-S) 8.6-50 MG tablet Take 2 tablets by mouth 2 (two) times daily. (Patient taking differently: Take 2 tablets by mouth 2 (two) times daily as needed for mild constipation or moderate constipation.) 60 tablet 0   trimethoprim (TRIMPEX) 100 MG tablet Take 100 mg by mouth daily.     No current facility-administered medications on file prior to visit.   Allergies  Allergen Reactions   Doxycycline  Nausea Only   Codeine Nausea And Vomiting   Pollen Extract Itching and Other (See Comments)    Congestion, itchy eyes, runny nose   Family History  Problem Relation Age of Onset   Hypertension Mother    Heart failure Mother    Diabetes Mother    Pancreatic cancer Father    Colon cancer Maternal Aunt    Cancer Sister    Thyroid  disease Paternal Aunt    Cancer Paternal Aunt    Thyroid  disease Other  paternal side   Cancer Maternal Grandfather    Diabetes Paternal Grandmother    Diabetes Maternal Uncle    Colon polyps Neg Hx    Stomach cancer Neg Hx    PE: BP 130/80   Pulse 70   Ht 5' 7.5 (1.715 m)   Wt 215 lb (97.5 kg)   SpO2 98%   BMI 33.18 kg/m  Wt Readings from Last 10 Encounters:  11/20/23 215 lb (97.5 kg)  09/25/23 211 lb 3.2 oz (95.8 kg)  08/08/23 207 lb  4 oz (94 kg)  07/27/23 203 lb 11.3 oz (92.4 kg)  06/15/23 205 lb 8 oz (93.2 kg)  05/22/23 206 lb 12.8 oz (93.8 kg)  05/17/23 204 lb (92.5 kg)  04/11/23 205 lb (93 kg)  01/18/23 208 lb 9.6 oz (94.6 kg)  01/09/23 208 lb (94.3 kg)   Constitutional: overweight, in NAD, walks with a walker Eyes:  EOMI, no exophthalmos ENT: no neck masses, thyroid  not palpable, no cervical lymphadenopathy Cardiovascular: RRR, No MRG Respiratory: CTA B Musculoskeletal: no deformities Skin:no rashes Neurological: no tremor with outstretched hands  ASSESSMENT: 1.  Post ablative hypothyroidism - after RAI tx for left toxic adenoma  2.  Multinodular goiter  PLAN:  1. Patient with longstanding hypothyroidism, on levothyroxine  therapy - latest thyroid  labs reviewed with pt. >> normal approximately 2 months ago: Lab Results  Component Value Date   TSH 0.40 09/25/2023  - she continues on LT4 100 mcg daily - pt feels good on this dose, but she does have weight gain of 9 pounds. - we discussed about taking the thyroid  hormone every day, with water , >30 minutes before breakfast, separated by >4 hours from acid reflux medications, calcium, iron, multivitamins. Pt. is taking it correctly.  I previously recommended to move levothyroxine  on the nightstand to be able to take it as soon as she wakes up - will repeat her thyroid  tests today since her TSH has been decreasing in the last few months: TSH and fT4 - If labs are abnormal, she will need to return for repeat TFTs in 1.5 months - RTC in  6 mo  2.  Multinodular goiter - Diagnosed in 2013, status post 2 biopsies in 2016: The dominant nodule biopsy was benign while the left nodule biopsy was inconclusive (Bethesda category 3).  She had RAI treatment in 07/2018 and on the follow-up ultrasound in 2021, a small thyroid  gland was evident with only small thyroid  nodules, for which no follow-up was recommended. - No neck compression symptoms (other than occasional  dysphagia with water , which is not new or bothersome for her) or masses felt on palpation of her neck today - We did discuss in the past about the fact that RAI treatment is able to shrink thyroid  gland size and also the size of the nodules. - Will continue to follow her expectantly  Needs refills.  Orders Placed This Encounter  Procedures   TSH   T4, free   Lela Fendt, MD PhD [facility]

## 2023-11-20 NOTE — Patient Instructions (Signed)
 Please continue levothyroxine 100 mcg daily.  Take the thyroid hormone every day, with water, fasting, at least 30 minutes before breakfast, separated by at least 4 hours from: - acid reflux medications - calcium - iron - multivitamins  Please stop at the lab.  Please return in 6 months.

## 2023-11-21 ENCOUNTER — Ambulatory Visit: Payer: Self-pay | Admitting: Internal Medicine

## 2023-11-21 MED ORDER — LEVOTHYROXINE SODIUM 88 MCG PO TABS: 88.0000 ug | ORAL_TABLET | Freq: Every day | ORAL | 8 refills | Status: AC

## 2023-11-21 NOTE — Addendum Note (Signed)
 Addended by: TRIXIE FILE on: 11/21/2023 12:32 PM   Modules accepted: Orders

## 2023-11-23 ENCOUNTER — Other Ambulatory Visit: Payer: Self-pay | Admitting: Physician Assistant

## 2023-11-23 MED ORDER — PANTOPRAZOLE SODIUM 40 MG PO TBEC: 40.0000 mg | DELAYED_RELEASE_TABLET | Freq: Every day | ORAL | Status: DC

## 2023-11-23 NOTE — Telephone Encounter (Signed)
 Copied from CRM [id number]. Topic: Clinical - Medication Refill >> Nov 23, 2023 11:32 AM Dedra B wrote: Medication: pantoprazole  (PROTONIX ) 40 MG tablet  Has the patient contacted their pharmacy? Yes  (Agent: If yes, when and what did the pharmacy advise?) Pharmacy told her they sent request to office but didn't;t hear back  This is the patient's preferred pharmacy:  [facility] [address], [place] - [address] [place] [address] Phone: [phone] Fax: [phone]  Is this the correct pharmacy for this prescription? Yes  Has the prescription been filled recently? No  Is the patient out of the medication? Yes  Has the patient been seen for an appointment in the last year OR does the patient have an upcoming appointment? Yes  Can we respond through MyChart? Yes  Agent: Please be advised that Rx refills may take up to 3 business days. We ask that you follow-up with your pharmacy.

## 2023-11-30 ENCOUNTER — Other Ambulatory Visit: Payer: Self-pay | Admitting: Physician Assistant

## 2023-11-30 MED ORDER — PANTOPRAZOLE SODIUM 40 MG PO TBEC: 40.0000 mg | DELAYED_RELEASE_TABLET | Freq: Every day | ORAL | 1 refills | Status: AC

## 2023-11-30 NOTE — Telephone Encounter (Signed)
 Copied from CRM [id number]. Topic: Clinical - Medication Refill >> Nov 30, 2023  9:28 AM Charlet HERO wrote: Medication: pantoprazole  (PROTONIX ) 40 MG tablet  Has the patient contacted their pharmacy? Yes Was discontinued  This is the patient's preferred pharmacy:  [facility] [address], [place] - [address] [place] [address] Phone: [phone] Fax: [phone]      Is this the correct pharmacy for this prescription? Yes If no, delete pharmacy and type the correct one.   Has the prescription been filled recently? Yes  Is the patient out of the medication? Yes  Has the patient been seen for an appointment in the last year OR does the patient have an upcoming appointment? Yes  Can we respond through MyChart? Yes  Agent: Please be advised that Rx refills may take up to 3 business days. We ask that you follow-up with your pharmacy.

## 2023-12-24 ENCOUNTER — Ambulatory Visit (INDEPENDENT_AMBULATORY_CARE_PROVIDER_SITE_OTHER): Payer: Medicare Other

## 2023-12-24 VITALS — Ht 67.0 in | Wt 215.0 lb

## 2023-12-24 DIAGNOSIS — Z0000 Encounter for general adult medical examination without abnormal findings: Secondary | ICD-10-CM

## 2023-12-24 DIAGNOSIS — Z139 Encounter for screening, unspecified: Secondary | ICD-10-CM

## 2023-12-24 NOTE — Progress Notes (Signed)
 Subjective:   Kari Coleman is a 73 y.o. who presents for a Medicare Wellness preventive visit.  As a reminder, Annual Wellness Visits don't include a physical exam, and some assessments may be limited, especially if this visit is performed virtually. We may recommend an in-person follow-up visit with your provider if needed.  Visit Complete: Virtual I connected with  [name] on 12/24/23 by a audio enabled telemedicine application and verified that I am speaking with the correct person using two identifiers.  Patient Location: Home  Provider Location: Office/Clinic  I discussed the limitations of evaluation and management by telemedicine. The patient expressed understanding and agreed to proceed.  Vital Signs: Because this visit was a virtual/telehealth visit, some criteria may be missing or patient reported. Any vitals not documented were not able to be obtained and vitals that have been documented are patient reported.  VideoDeclined- This patient declined Librarian, academic. Therefore the visit was completed with audio only.  Persons Participating in Visit: Patient.  AWV Questionnaire: No: Patient Medicare AWV questionnaire was not completed prior to this visit.  Cardiac Risk Factors include: advanced age (>18men, >59 women);dyslipidemia;hypertension;obesity (BMI >30kg/m2)     Objective:    Today's Vitals   12/24/23 1259  Weight: 215 lb (97.5 kg)  Height: 5' 7 (1.702 m)   Body mass index is 33.67 kg/m.     12/24/2023    1:06 PM 07/25/2023    2:00 AM 06/25/2023    9:34 PM 12/19/2022    2:34 AM 11/26/2022    8:49 PM 12/26/2021    6:39 PM 12/12/2021    1:17 PM  Advanced Directives  Does Patient Have a Medical Advance Directive? No No No No No No No  Would patient like information on creating a medical advance directive? No - Patient declined No - Patient declined No - Patient declined No - Patient declined No - Patient declined No - Patient  declined No - Patient declined    Current Medications (verified) Outpatient Encounter Medications as of 12/24/2023  Medication Sig   acetaminophen  (TYLENOL ) 325 MG tablet Take 650 mg by mouth every 4 (four) hours as needed for mild pain or moderate pain.   albuterol  (PROVENTIL  HFA) 108 (90 Base) MCG/ACT inhaler Inhale 2 puffs into the lungs every 6 (six) hours as needed for wheezing or shortness of breath.   albuterol  (PROVENTIL ) (2.5 MG/3ML) 0.083% nebulizer solution Take 3 mLs (2.5 mg total) by nebulization every 6 (six) hours as needed for wheezing or shortness of breath.   amLODipine  (NORVASC ) 10 MG tablet Take 1 tablet (10 mg total) by mouth daily.   brimonidine  (ALPHAGAN ) 0.2 % ophthalmic solution Place 1 drop into the right eye at bedtime.   Cholecalciferol  (VITAMIN D -3) 25 MCG (1000 UT) CAPS Take 2,000 Units by mouth daily.   cyanocobalamin  (VITAMIN B12) 1000 MCG tablet Take 1 tablet (1,000 mcg total) by mouth daily.   latanoprost  (XALATAN ) 0.005 % ophthalmic solution Place 1 drop into both eyes at bedtime.   levothyroxine  (SYNTHROID ) 88 MCG tablet Take 1 tablet (88 mcg total) by mouth daily before breakfast.   lisinopril  (ZESTRIL ) 40 MG tablet TAKE 1 TABLET BY MOUTH DAILY   metoprolol  tartrate (LOPRESSOR ) 25 MG tablet Take 1 tablet (25 mg total) by mouth 2 (two) times daily.   nitrofurantoin, macrocrystal-monohydrate, (MACROBID) 100 MG capsule Take 100 mg by mouth 2 (two) times daily.   ondansetron  (ZOFRAN ) 4 MG tablet Take 1 tablet (4 mg total)  by mouth every 6 (six) hours.   pantoprazole  (PROTONIX ) 40 MG tablet Take 1 tablet (40 mg total) by mouth daily.   polyethylene glycol (MIRALAX  / GLYCOLAX ) 17 g packet Take 17 g by mouth daily.   pravastatin  (PRAVACHOL ) 10 MG tablet TAKE 1 TABLET BY MOUTH DAILY   senna-docusate (SENOKOT-S) 8.6-50 MG tablet Take 2 tablets by mouth 2 (two) times daily.   trimethoprim (TRIMPEX) 100 MG tablet Take 100 mg by mouth daily.   No facility-administered  encounter medications on file as of 12/24/2023.    Allergies (verified) Doxycycline , Codeine, and Pollen extract   History: Past Medical History:  Diagnosis Date   Allergy    Arthritis    Cataract    Cervical cancer (HCC)    GERD (gastroesophageal reflux disease)    Glaucoma 2017   History of echocardiogram    Echo 9/16:  Mild LVH, EF 55-60%, no RWMA, Gr 1 DD, mild MR // Echo 1/17:  Mild LVH, EF 50-55%, Gr 1 DD, mild to mod MR, mild LAE   Hypertension    Hyperthyroidism    s/p RAI treatment   Palpitations    PONV (postoperative nausea and vomiting)    years ago  . Woke up and saw asian people    PONV (postoperative nausea and vomiting) 07/18/2021   Retinal detachment    L eye - partial blindness   Tobacco abuse    Tuberculosis    positive test as a caregiver cxr annually    Urinary incontinence    Past Surgical History:  Procedure Laterality Date   ABDOMINAL HYSTERECTOMY  1984   bladder surgey for incontinence     EYE SURGERY     REPAIR OF COMPLEX TRACTION RETINAL DETACHMENT     thyroid  radiation     TOTAL ABDOMINAL HYSTERECTOMY W/ BILATERAL SALPINGOOPHORECTOMY     TOTAL KNEE ARTHROPLASTY Right 04/22/2019   Procedure: RIGHT TOTAL KNEE ARTHROPLASTY;  Surgeon: Sheril Coy, MD;  Location: [facility];  Service: Orthopedics;  Laterality: Right;  PENDING PRE-OP FOR SDDC APPROVAL   Family History  Problem Relation Age of Onset   Hypertension Mother    Heart failure Mother    Diabetes Mother    Pancreatic cancer Father    Colon cancer Maternal Aunt    Cancer Sister    Thyroid  disease Paternal Aunt    Cancer Paternal Aunt    Thyroid  disease Other        paternal side   Cancer Maternal Grandfather    Diabetes Paternal Grandmother    Diabetes Maternal Uncle    Colon polyps Neg Hx    Stomach cancer Neg Hx    Social History   Socioeconomic History   Marital status: Single    Spouse name: Not on file   Number of children: 2   Years of education: Not on file   Highest  education level: Not on file  Occupational History   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   Occupation: Retired  Tobacco Use   Smoking status: Former    Current packs/day: 0.00    Average packs/day: 0.5 packs/day for 48.0 years (24.0 ttl pk-yrs)    Types: Cigarettes    Start date: 11/30/1968    Quit date: 10/31/2016    Years since quitting: 7.1   Smokeless tobacco: Never   Tobacco comments:    did not smoke more than .5 pack   Vaping Use   Vaping status: Never Used  Substance and Sexual  Activity   Alcohol use: Not Currently    Alcohol/week: 0.0 standard drinks of alcohol    Comment: rarely once a year   Drug use: No   Sexual activity: Not Currently  Other Topics Concern   Not on file  Social History Narrative   Caregiver   Daughter and grandson in household   Social Drivers of Health   Financial Resource Strain: Low Risk  (12/24/2023)   Overall Financial Resource Strain (CARDIA)    Difficulty of Paying Living Expenses: Not hard at all  Food Insecurity: No Food Insecurity (12/24/2023)   Hunger Vital Sign    Worried About Running Out of Food in the Last Year: Never true    Ran Out of Food in the Last Year: Never true  Transportation Needs: No Transportation Needs (12/24/2023)   PRAPARE - Administrator, Civil Service (Medical): No    Lack of Transportation (Non-Medical): No  Physical Activity: Insufficiently Active (12/24/2023)   Exercise Vital Sign    Days of Exercise per Week: 1 day    Minutes of Exercise per Session: 20 min  Stress: No Stress Concern Present (12/24/2023)   [employer] of Occupational Health - Occupational Stress Questionnaire    Feeling of Stress: Not at all  Social Connections: Moderately Isolated (12/24/2023)   Social Connection and Isolation Panel    Frequency of Communication with Friends and Family: More than three times a week    Frequency of Social Gatherings with Friends and Family: Twice a week    Attends Religious  Services: More than 4 times per year    Active Member of [employer] or Organizations: No    Attends Banker Meetings: Never    Marital Status: Never married    Tobacco Counseling Counseling given: Not Answered Tobacco comments: did not smoke more than .5 pack     Clinical Intake:  Pre-visit preparation completed: Yes  Pain : No/denies pain     BMI - recorded: 33.67 Nutritional Status: BMI > 30  Obese Nutritional Risks: None Diabetes: No  Lab Results  Component Value Date   HGBA1C 5.5 04/11/2023   HGBA1C 5.7 02/18/2021   HGBA1C 5.4 09/15/2020     How often do you need to have someone help you when you read instructions, pamphlets, or other written materials from your doctor or pharmacy?: 1 - Never  Interpreter Needed?: No  Information entered by :: Ellouise Haws, LPN   Activities of Daily Living     12/24/2023    1:01 PM 07/25/2023    2:00 AM  In your present state of health, do you have any difficulty performing the following activities:  Hearing? 0 0  Vision? 0 0  Difficulty concentrating or making decisions? 0 0  Walking or climbing stairs? 1   Comment wobbling at times   Dressing or bathing? 0   Doing errands, shopping? 0 0  Preparing Food and eating ? N   Comment daughter assist   Using the Toilet? N   In the past six months, have you accidently leaked urine? Y   Comment at times brief   Do you have problems with loss of bowel control? N   Managing your Medications? Y   Comment daughter assist   Managing your Finances? N   Housekeeping or managing your Housekeeping? N     Patient Care Team: Job Lukes, [name] as PCP - General (Physician Assistant) Marquette Ozell BIRCH, DO as Consulting Physician (Sports Medicine) Monetta Redell PARAS,  MD as Consulting Physician (Cardiology)  I have updated your Care Teams any recent Medical Services you may have received from other providers in the past year.     Assessment:   This is a routine wellness  examination for Daryn.  Hearing/Vision screen Hearing Screening - Comments:: Pt denies any hearing issues  Vision Screening - Comments:: Wears rx glasses - up to date with routine eye exams with Dr juliane Bracket    Goals Addressed             This Visit's Progress    Patient Stated       Get better balance to walk better        Depression Screen     12/24/2023    1:03 PM 09/25/2023    2:11 PM 04/11/2023    4:02 PM 12/26/2022   11:15 AM 12/12/2021    1:15 PM 11/22/2020   11:51 AM 11/17/2019   11:55 AM  PHQ 2/9 Scores  PHQ - 2 Score 0 0 0 0 0 0 0  PHQ- 9 Score   0        Fall Risk     12/24/2023    1:06 PM 09/25/2023    2:11 PM 08/31/2022   11:09 AM 12/12/2021    1:18 PM 11/22/2020   11:53 AM  Fall Risk   Falls in the past year? 0 0 1 1 0  Number falls in past yr: 0 0 0 1 0  Injury with Fall? 0 0 0 0 0  Risk for fall due to : Impaired balance/gait;Impaired mobility No Fall Risks History of fall(s) Impaired vision;Impaired mobility;Impaired balance/gait Impaired vision;Impaired balance/gait  Follow up Falls prevention discussed  Falls evaluation completed Falls prevention discussed  Falls prevention discussed      Data saved with a previous flowsheet row definition    MEDICARE RISK AT HOME:  Medicare Risk at Home Any stairs in or around the home?: Yes If so, are there any without handrails?: No Home free of loose throw rugs in walkways, pet beds, electrical cords, etc?: Yes Adequate lighting in your home to reduce risk of falls?: Yes Life alert?: No Use of a cane, walker or w/c?: Yes Grab bars in the bathroom?: Yes Shower chair or bench in shower?: Yes Elevated toilet seat or a handicapped toilet?: Yes  TIMED UP AND GO:  Was the test performed?  No  Cognitive Function: 6CIT completed    06/07/2017   12:30 PM  MMSE - Mini Mental State Exam  Not completed: --        12/24/2023    1:07 PM 12/12/2021    1:19 PM 11/22/2020   11:55 AM 11/17/2019   12:02 PM  6CIT  Screen  What Year? 0 points 0 points 0 points 0 points  What month? 0 points 0 points 0 points 0 points  What time? 0 points 0 points 0 points   Count back from 20 0 points 0 points 0 points 0 points  Months in reverse 0 points 4 points 0 points 0 points  Repeat phrase 0 points 6 points 0 points 4 points  Total Score 0 points 10 points 0 points     Immunizations Immunization History  Administered Date(s) Administered   Fluad Quad(high Dose 65+) 04/09/2019, 01/07/2021, 01/23/2022   INFLUENZA, HIGH DOSE SEASONAL PF 06/10/2018   Influenza-Unspecified 12/14/2022   PFIZER(Purple Top)SARS-COV-2 Vaccination 06/15/2019, 07/07/2019, 03/22/2020   PNEUMOCOCCAL CONJUGATE-20 04/11/2023   PPD Test 02/29/2016  Screening Tests Health Maintenance  Topic Date Due   Lung Cancer Screening  04/25/2023   Influenza Vaccine  11/02/2023   COVID-19 Vaccine (4 - 2025-26 season) 12/03/2023   DTaP/Tdap/Td (1 - Tdap) 01/09/2024 (Originally 09/17/1969)   Zoster Vaccines- Shingrix (1 of 2) 04/10/2024 (Originally 09/17/1969)   Medicare Annual Wellness (AWV)  12/23/2024   Colonoscopy  01/29/2030   Pneumococcal Vaccine: 50+ Years  Completed   DEXA SCAN  Completed   Hepatitis C Screening  Completed   HPV VACCINES  Aged Out   Meningococcal B Vaccine  Aged Out   Mammogram  Discontinued    Health Maintenance Items Addressed: See Nurse Notes at the end of this note  Additional Screening:  Vision Screening: Recommended annual ophthalmology exams for early detection of glaucoma and other disorders of the eye. Is the patient up to date with their annual eye exam?  Yes  Who is the provider or what is the name of the office in which the patient attends annual eye exams? Dr Reyes Bracket   Dental Screening: Recommended annual dental exams for proper oral hygiene  Community Resource Referral / Chronic Care Management: CRR required this visit?  No   CCM required this visit?  No   Plan:    I have personally  reviewed and noted the following in the patient's chart:   Medical and social history Use of alcohol, tobacco or illicit drugs  Current medications and supplements including opioid prescriptions. Patient is not currently taking opioid prescriptions. Functional ability and status Nutritional status Physical activity Advanced directives List of other physicians Hospitalizations, surgeries, and ER visits in previous 12 months Vitals Screenings to include cognitive, depression, and falls Referrals and appointments  In addition, I have reviewed and discussed with patient certain preventive protocols, quality metrics, and best practice recommendations. A written personalized care plan for preventive services as well as general preventive health recommendations were provided to patient.   Ellouise VEAR Haws, LPN   [id number]   After Visit Summary: (MyChart) Due to this being a telephonic visit, the after visit summary with patients personalized plan was offered to patient via MyChart   Notes: Nothing significant to report at this time.

## 2023-12-24 NOTE — Patient Instructions (Signed)
 Kari Coleman,  Thank you for taking the time for your Medicare Wellness Visit. I appreciate your continued commitment to your health goals. Please review the care plan we discussed, and feel free to reach out if I can assist you further.  Medicare recommends these wellness visits once per year to help you and your care team stay ahead of potential health issues. These visits are designed to focus on prevention, allowing your provider to concentrate on managing your acute and chronic conditions during your regular appointments.  Please note that Annual Wellness Visits do not include a physical exam. Some assessments may be limited, especially if the visit was conducted virtually. If needed, we may recommend a separate in-person follow-up with your provider.  Ongoing Care Seeing your primary care provider every 3 to 6 months helps us  monitor your health and provide consistent, personalized care.   Referrals If a referral was made during today's visit and you haven't received any updates within two weeks, please contact the referred provider directly to check on the status.  Recommended Screenings:  Health Maintenance  Topic Date Due   Screening for Lung Cancer  04/25/2023   Flu Shot  11/02/2023   COVID-19 Vaccine (4 - 2025-26 season) 12/03/2023   DTaP/Tdap/Td vaccine (1 - Tdap) 01/09/2024*   Zoster (Shingles) Vaccine (1 of 2) 04/10/2024*   Medicare Annual Wellness Visit  12/23/2024   Colon Cancer Screening  01/29/2030   Pneumococcal Vaccine for age over 3  Completed   DEXA scan (bone density measurement)  Completed   Hepatitis C Screening  Completed   HPV Vaccine  Aged Out   Meningitis B Vaccine  Aged Out   Breast Cancer Screening  Discontinued  *Topic was postponed. The date shown is not the original due date.       12/24/2023    1:06 PM  Advanced Directives  Does Patient Have a Medical Advance Directive? No  Would patient like information on creating a medical advance directive?  No - Patient declined   Advance Care Planning is important because it: Ensures you receive medical care that aligns with your values, goals, and preferences. Provides guidance to your family and loved ones, reducing the emotional burden of decision-making during critical moments.  Vision: Annual vision screenings are recommended for early detection of glaucoma, cataracts, and diabetic retinopathy. These exams can also reveal signs of chronic conditions such as diabetes and high blood pressure.  Dental: Annual dental screenings help detect early signs of oral cancer, gum disease, and other conditions linked to overall health, including heart disease and diabetes.  Please see the attached documents for additional preventive care recommendations.

## 2024-03-10 ENCOUNTER — Inpatient Hospital Stay (HOSPITAL_COMMUNITY)

## 2024-03-10 ENCOUNTER — Other Ambulatory Visit: Payer: Self-pay

## 2024-03-10 ENCOUNTER — Emergency Department (HOSPITAL_COMMUNITY)

## 2024-03-10 ENCOUNTER — Encounter (HOSPITAL_COMMUNITY): Payer: Self-pay

## 2024-03-10 ENCOUNTER — Inpatient Hospital Stay (HOSPITAL_COMMUNITY)
Admission: EM | Admit: 2024-03-10 | Discharge: 2024-03-15 | DRG: 871 | Disposition: A | Attending: Internal Medicine | Admitting: Internal Medicine

## 2024-03-10 DIAGNOSIS — I130 Hypertensive heart and chronic kidney disease with heart failure and stage 1 through stage 4 chronic kidney disease, or unspecified chronic kidney disease: Secondary | ICD-10-CM | POA: Diagnosis present

## 2024-03-10 DIAGNOSIS — Z79899 Other long term (current) drug therapy: Secondary | ICD-10-CM | POA: Diagnosis not present

## 2024-03-10 DIAGNOSIS — E7220 Disorder of urea cycle metabolism, unspecified: Secondary | ICD-10-CM | POA: Diagnosis present

## 2024-03-10 DIAGNOSIS — A4189 Other specified sepsis: Secondary | ICD-10-CM | POA: Diagnosis present

## 2024-03-10 DIAGNOSIS — J1000 Influenza due to other identified influenza virus with unspecified type of pneumonia: Secondary | ICD-10-CM | POA: Diagnosis present

## 2024-03-10 DIAGNOSIS — Z8679 Personal history of other diseases of the circulatory system: Secondary | ICD-10-CM | POA: Diagnosis not present

## 2024-03-10 DIAGNOSIS — E039 Hypothyroidism, unspecified: Secondary | ICD-10-CM | POA: Diagnosis not present

## 2024-03-10 DIAGNOSIS — G9341 Metabolic encephalopathy: Secondary | ICD-10-CM | POA: Diagnosis present

## 2024-03-10 DIAGNOSIS — E785 Hyperlipidemia, unspecified: Secondary | ICD-10-CM | POA: Diagnosis present

## 2024-03-10 DIAGNOSIS — J101 Influenza due to other identified influenza virus with other respiratory manifestations: Secondary | ICD-10-CM | POA: Diagnosis present

## 2024-03-10 DIAGNOSIS — J9601 Acute respiratory failure with hypoxia: Secondary | ICD-10-CM | POA: Diagnosis present

## 2024-03-10 DIAGNOSIS — Z1152 Encounter for screening for COVID-19: Secondary | ICD-10-CM | POA: Diagnosis not present

## 2024-03-10 DIAGNOSIS — Z96651 Presence of right artificial knee joint: Secondary | ICD-10-CM | POA: Diagnosis present

## 2024-03-10 DIAGNOSIS — Z87891 Personal history of nicotine dependence: Secondary | ICD-10-CM | POA: Diagnosis not present

## 2024-03-10 DIAGNOSIS — Z888 Allergy status to other drugs, medicaments and biological substances status: Secondary | ICD-10-CM | POA: Diagnosis not present

## 2024-03-10 DIAGNOSIS — I5032 Chronic diastolic (congestive) heart failure: Secondary | ICD-10-CM | POA: Diagnosis present

## 2024-03-10 DIAGNOSIS — Z79890 Hormone replacement therapy (postmenopausal): Secondary | ICD-10-CM | POA: Diagnosis not present

## 2024-03-10 DIAGNOSIS — A419 Sepsis, unspecified organism: Principal | ICD-10-CM

## 2024-03-10 DIAGNOSIS — E782 Mixed hyperlipidemia: Secondary | ICD-10-CM

## 2024-03-10 DIAGNOSIS — Z885 Allergy status to narcotic agent status: Secondary | ICD-10-CM | POA: Diagnosis not present

## 2024-03-10 DIAGNOSIS — R739 Hyperglycemia, unspecified: Secondary | ICD-10-CM

## 2024-03-10 DIAGNOSIS — J102 Influenza due to other identified influenza virus with gastrointestinal manifestations: Secondary | ICD-10-CM | POA: Diagnosis present

## 2024-03-10 DIAGNOSIS — E872 Acidosis, unspecified: Secondary | ICD-10-CM | POA: Diagnosis present

## 2024-03-10 DIAGNOSIS — J1001 Influenza due to other identified influenza virus with the same other identified influenza virus pneumonia: Secondary | ICD-10-CM | POA: Diagnosis present

## 2024-03-10 DIAGNOSIS — J45909 Unspecified asthma, uncomplicated: Secondary | ICD-10-CM | POA: Diagnosis present

## 2024-03-10 DIAGNOSIS — R748 Abnormal levels of other serum enzymes: Secondary | ICD-10-CM

## 2024-03-10 DIAGNOSIS — Z8249 Family history of ischemic heart disease and other diseases of the circulatory system: Secondary | ICD-10-CM | POA: Diagnosis not present

## 2024-03-10 DIAGNOSIS — N182 Chronic kidney disease, stage 2 (mild): Secondary | ICD-10-CM | POA: Diagnosis present

## 2024-03-10 DIAGNOSIS — N179 Acute kidney failure, unspecified: Secondary | ICD-10-CM | POA: Diagnosis present

## 2024-03-10 DIAGNOSIS — R6520 Severe sepsis without septic shock: Secondary | ICD-10-CM | POA: Diagnosis present

## 2024-03-10 LAB — BLOOD GAS, ARTERIAL
Acid-Base Excess: 1.8 mmol/L (ref 0.0–2.0)
Bicarbonate: 25.7 mmol/L (ref 20.0–28.0)
Drawn by: 25770
O2 Content: 2 L/min
O2 Saturation: 98.5 %
Patient temperature: 39.1
pCO2 arterial: 40 mmHg (ref 32–48)
pH, Arterial: 7.42 (ref 7.35–7.45)
pO2, Arterial: 95 mmHg (ref 83–108)

## 2024-03-10 LAB — CBC WITH DIFFERENTIAL/PLATELET
Abs Immature Granulocytes: 0.03 K/uL (ref 0.00–0.07)
Abs Immature Granulocytes: 0.04 K/uL (ref 0.00–0.07)
Basophils Absolute: 0 K/uL (ref 0.0–0.1)
Basophils Absolute: 0 K/uL (ref 0.0–0.1)
Basophils Relative: 0 %
Basophils Relative: 1 %
Eosinophils Absolute: 0 K/uL (ref 0.0–0.5)
Eosinophils Absolute: 0.1 K/uL (ref 0.0–0.5)
Eosinophils Relative: 0 %
Eosinophils Relative: 1 %
HCT: 42.9 % (ref 36.0–46.0)
HCT: 45.2 % (ref 36.0–46.0)
Hemoglobin: 13.9 g/dL (ref 12.0–15.0)
Hemoglobin: 15 g/dL (ref 12.0–15.0)
Immature Granulocytes: 0 %
Immature Granulocytes: 0 %
Lymphocytes Relative: 4 %
Lymphocytes Relative: 5 %
Lymphs Abs: 0.3 K/uL — ABNORMAL LOW (ref 0.7–4.0)
Lymphs Abs: 0.5 K/uL — ABNORMAL LOW (ref 0.7–4.0)
MCH: 29.5 pg (ref 26.0–34.0)
MCH: 29.8 pg (ref 26.0–34.0)
MCHC: 32.4 g/dL (ref 30.0–36.0)
MCHC: 33.2 g/dL (ref 30.0–36.0)
MCV: 88.8 fL (ref 80.0–100.0)
MCV: 91.9 fL (ref 80.0–100.0)
Monocytes Absolute: 0.2 K/uL (ref 0.1–1.0)
Monocytes Absolute: 0.5 K/uL (ref 0.1–1.0)
Monocytes Relative: 3 %
Monocytes Relative: 6 %
Neutro Abs: 7.4 K/uL (ref 1.7–7.7)
Neutro Abs: 8 K/uL — ABNORMAL HIGH (ref 1.7–7.7)
Neutrophils Relative %: 89 %
Neutrophils Relative %: 91 %
Platelets: 214 K/uL (ref 150–400)
Platelets: 248 K/uL (ref 150–400)
RBC: 4.67 MIL/uL (ref 3.87–5.11)
RBC: 5.09 MIL/uL (ref 3.87–5.11)
RDW: 12.6 % (ref 11.5–15.5)
RDW: 12.7 % (ref 11.5–15.5)
WBC: 8.1 K/uL (ref 4.0–10.5)
WBC: 9.1 K/uL (ref 4.0–10.5)
nRBC: 0 % (ref 0.0–0.2)
nRBC: 0 % (ref 0.0–0.2)

## 2024-03-10 LAB — LACTIC ACID, PLASMA
Lactic Acid, Venous: 3.1 mmol/L (ref 0.5–1.9)
Lactic Acid, Venous: 1.8 mmol/L (ref 0.5–1.9)

## 2024-03-10 LAB — BASIC METABOLIC PANEL WITH GFR
Anion gap: 13 (ref 5–15)
BUN: 14 mg/dL (ref 8–23)
CO2: 25 mmol/L (ref 22–32)
Calcium: 10 mg/dL (ref 8.9–10.3)
Chloride: 101 mmol/L (ref 98–111)
Creatinine, Ser: 1.02 mg/dL — ABNORMAL HIGH (ref 0.44–1.00)
GFR, Estimated: 57 mL/min — ABNORMAL LOW (ref >60)
Glucose, Bld: 129 mg/dL — ABNORMAL HIGH (ref 70–99)
Potassium: 4.1 mmol/L (ref 3.5–5.1)
Sodium: 138 mmol/L (ref 135–145)

## 2024-03-10 LAB — MRSA NEXT GEN BY PCR, NASAL: MRSA by PCR Next Gen: NOT DETECTED

## 2024-03-10 LAB — URINALYSIS, ROUTINE W REFLEX MICROSCOPIC
Bilirubin Urine: NEGATIVE
Glucose, UA: NEGATIVE mg/dL
Hgb urine dipstick: NEGATIVE
Ketones, ur: NEGATIVE mg/dL
Leukocytes,Ua: NEGATIVE
Nitrite: NEGATIVE
Protein, ur: NEGATIVE mg/dL
Specific Gravity, Urine: 1.016 (ref 1.005–1.030)
pH: 7 (ref 5.0–8.0)

## 2024-03-10 LAB — MAGNESIUM: Magnesium: 1.9 mg/dL (ref 1.7–2.4)

## 2024-03-10 LAB — COMPREHENSIVE METABOLIC PANEL WITH GFR
ALT: 21 U/L (ref 0–44)
AST: 25 U/L (ref 15–41)
Albumin: 4.5 g/dL (ref 3.5–5.0)
Alkaline Phosphatase: 135 U/L — ABNORMAL HIGH (ref 38–126)
Anion gap: 13 (ref 5–15)
BUN: 15 mg/dL (ref 8–23)
CO2: 24 mmol/L (ref 22–32)
Calcium: 10.3 mg/dL (ref 8.9–10.3)
Chloride: 101 mmol/L (ref 98–111)
Creatinine, Ser: 1.09 mg/dL — ABNORMAL HIGH (ref 0.44–1.00)
GFR, Estimated: 53 mL/min — ABNORMAL LOW (ref >60)
Glucose, Bld: 173 mg/dL — ABNORMAL HIGH (ref 70–99)
Potassium: 4.1 mmol/L (ref 3.5–5.1)
Sodium: 137 mmol/L (ref 135–145)
Total Bilirubin: 0.7 mg/dL (ref 0.0–1.2)
Total Protein: 8.7 g/dL — ABNORMAL HIGH (ref 6.5–8.1)

## 2024-03-10 LAB — PROTIME-INR
INR: 1.1 (ref 0.8–1.2)
Prothrombin Time: 14.9 s (ref 11.4–15.2)

## 2024-03-10 LAB — BLOOD GAS, VENOUS
Acid-Base Excess: 1.7 mmol/L (ref 0.0–2.0)
Bicarbonate: 25.8 mmol/L (ref 20.0–28.0)
Drawn by: 71375
O2 Saturation: 97.2 %
Patient temperature: 36.8
pCO2, Ven: 38 mmHg — ABNORMAL LOW (ref 44–60)
pH, Ven: 7.44 — ABNORMAL HIGH (ref 7.25–7.43)
pO2, Ven: 75 mmHg — ABNORMAL HIGH (ref 32–45)

## 2024-03-10 LAB — CK: Total CK: 209 U/L (ref 38–234)

## 2024-03-10 LAB — AMMONIA: Ammonia: 65 umol/L — ABNORMAL HIGH (ref 9–35)

## 2024-03-10 LAB — TSH: TSH: 0.378 u[IU]/mL (ref 0.350–4.500)

## 2024-03-10 LAB — RESP PANEL BY RT-PCR (RSV, FLU A&B, COVID)  RVPGX2
Influenza A by PCR: POSITIVE — AB
Influenza B by PCR: NEGATIVE
Resp Syncytial Virus by PCR: NEGATIVE
SARS Coronavirus 2 by RT PCR: NEGATIVE

## 2024-03-10 LAB — VITAMIN B12: Vitamin B-12: 619 pg/mL (ref 180–914)

## 2024-03-10 LAB — I-STAT CG4 LACTIC ACID, ED
Lactic Acid, Venous: 2.2 mmol/L (ref 0.5–1.9)
Lactic Acid, Venous: 2.2 mmol/L (ref 0.5–1.9)

## 2024-03-10 LAB — PHOSPHORUS: Phosphorus: 2.1 mg/dL — ABNORMAL LOW (ref 2.5–4.6)

## 2024-03-10 LAB — PRO BRAIN NATRIURETIC PEPTIDE: Pro Brain Natriuretic Peptide: 1311 pg/mL — ABNORMAL HIGH (ref <300.0)

## 2024-03-10 LAB — PROCALCITONIN: Procalcitonin: 0.13 ng/mL

## 2024-03-10 MED ORDER — K PHOS MONO-SOD PHOS DI & MONO 155-852-130 MG PO TABS: 500.0000 mg | ORAL_TABLET | ORAL | Status: AC

## 2024-03-10 MED ORDER — VANCOMYCIN HCL IN DEXTROSE 1-5 GM/200ML-% IV SOLN
1000.0000 mg | Freq: Once | INTRAVENOUS | Status: DC
  Filled 2024-03-10: qty 200

## 2024-03-10 MED ORDER — SODIUM CHLORIDE 0.9 % IV SOLN
2.0000 g | Freq: Once | INTRAVENOUS | Status: AC
  Administered 2024-03-10: 2 g via INTRAVENOUS
  Filled 2024-03-10: qty 12.5

## 2024-03-10 MED ORDER — LACTULOSE 10 GM/15ML PO SOLN
20.0000 g | Freq: Two times a day (BID) | ORAL | Status: DC
  Administered 2024-03-10 – 2024-03-11 (×2): 20 g via ORAL
  Filled 2024-03-10 (×2): qty 30

## 2024-03-10 MED ORDER — IPRATROPIUM-ALBUTEROL 0.5-2.5 (3) MG/3ML IN SOLN
3.0000 mL | Freq: Four times a day (QID) | RESPIRATORY_TRACT | Status: DC
  Administered 2024-03-10 (×3): 3 mL via RESPIRATORY_TRACT
  Filled 2024-03-10 (×3): qty 3

## 2024-03-10 MED ORDER — IPRATROPIUM-ALBUTEROL 0.5-2.5 (3) MG/3ML IN SOLN
3.0000 mL | RESPIRATORY_TRACT | Status: DC | PRN
  Administered 2024-03-11: 3 mL via RESPIRATORY_TRACT
  Filled 2024-03-10 (×2): qty 3

## 2024-03-10 MED ORDER — ORAL CARE MOUTH RINSE: 15.0000 mL | OROMUCOSAL | Status: DC | PRN

## 2024-03-10 MED ORDER — OSELTAMIVIR PHOSPHATE 30 MG PO CAPS
30.0000 mg | ORAL_CAPSULE | Freq: Two times a day (BID) | ORAL | Status: DC
  Administered 2024-03-10 – 2024-03-12 (×4): 30 mg via ORAL
  Filled 2024-03-10 (×7): qty 1

## 2024-03-10 MED ORDER — ACETAMINOPHEN 325 MG PO TABS
650.0000 mg | ORAL_TABLET | Freq: Four times a day (QID) | ORAL | Status: DC | PRN
  Administered 2024-03-11 – 2024-03-13 (×4): 650 mg via ORAL
  Filled 2024-03-10 (×5): qty 2

## 2024-03-10 MED ORDER — LACTATED RINGERS IV BOLUS
250.0000 mL | Freq: Once | INTRAVENOUS | Status: AC
  Administered 2024-03-10: 250 mL via INTRAVENOUS

## 2024-03-10 MED ORDER — SODIUM CHLORIDE 0.9 % IV SOLN
2.0000 g | INTRAVENOUS | Status: DC
  Administered 2024-03-10 – 2024-03-13 (×4): 2 g via INTRAVENOUS
  Filled 2024-03-10 (×4): qty 20

## 2024-03-10 MED ORDER — LEVOTHYROXINE SODIUM 88 MCG PO TABS
88.0000 ug | ORAL_TABLET | Freq: Every day | ORAL | Status: DC
  Administered 2024-03-11 – 2024-03-15 (×5): 88 ug via ORAL
  Filled 2024-03-10 (×5): qty 1

## 2024-03-10 MED ORDER — MELATONIN 3 MG PO TABS
3.0000 mg | ORAL_TABLET | Freq: Every evening | ORAL | Status: DC | PRN
  Administered 2024-03-12 – 2024-03-13 (×2): 3 mg via ORAL
  Filled 2024-03-10 (×2): qty 1

## 2024-03-10 MED ORDER — IPRATROPIUM-ALBUTEROL 0.5-2.5 (3) MG/3ML IN SOLN
3.0000 mL | Freq: Once | RESPIRATORY_TRACT | Status: AC | PRN
  Administered 2024-03-10: 3 mL via RESPIRATORY_TRACT

## 2024-03-10 MED ORDER — GUAIFENESIN ER 600 MG PO TB12
600.0000 mg | ORAL_TABLET | Freq: Two times a day (BID) | ORAL | Status: DC
  Administered 2024-03-10 – 2024-03-15 (×8): 600 mg via ORAL
  Filled 2024-03-10 (×10): qty 1

## 2024-03-10 MED ORDER — ONDANSETRON HCL 4 MG/2ML IJ SOLN
4.0000 mg | Freq: Four times a day (QID) | INTRAMUSCULAR | Status: DC | PRN
  Administered 2024-03-11: 4 mg via INTRAVENOUS
  Filled 2024-03-10: qty 2

## 2024-03-10 MED ORDER — LACTATED RINGERS IV BOLUS: 250.0000 mL | Freq: Once | INTRAVENOUS | Status: DC

## 2024-03-10 MED ORDER — ACETAMINOPHEN 650 MG RE SUPP
650.0000 mg | Freq: Four times a day (QID) | RECTAL | Status: DC | PRN
  Administered 2024-03-10: 650 mg via RECTAL
  Filled 2024-03-10: qty 1

## 2024-03-10 MED ORDER — LACTATED RINGERS IV BOLUS (SEPSIS)
1000.0000 mL | Freq: Once | INTRAVENOUS | Status: AC
  Administered 2024-03-10: 1000 mL via INTRAVENOUS

## 2024-03-10 MED ORDER — LACTATED RINGERS IV SOLN: INTRAVENOUS | Status: AC

## 2024-03-10 MED ORDER — AMLODIPINE BESYLATE 10 MG PO TABS
10.0000 mg | ORAL_TABLET | Freq: Every day | ORAL | Status: DC
  Administered 2024-03-11 – 2024-03-15 (×5): 10 mg via ORAL
  Filled 2024-03-10 (×5): qty 1

## 2024-03-10 MED ORDER — OSELTAMIVIR PHOSPHATE 75 MG PO CAPS
75.0000 mg | ORAL_CAPSULE | Freq: Two times a day (BID) | ORAL | Status: DC
  Administered 2024-03-10: 75 mg via ORAL
  Filled 2024-03-10 (×2): qty 1

## 2024-03-10 MED ORDER — VANCOMYCIN HCL 2000 MG/400ML IV SOLN
2000.0000 mg | Freq: Once | INTRAVENOUS | Status: AC
  Administered 2024-03-10: 2000 mg via INTRAVENOUS
  Filled 2024-03-10: qty 400

## 2024-03-10 MED ORDER — CHLORHEXIDINE GLUCONATE CLOTH 2 % EX PADS
6.0000 | MEDICATED_PAD | Freq: Every day | CUTANEOUS | Status: DC
  Administered 2024-03-10 – 2024-03-11 (×2): 6 via TOPICAL

## 2024-03-10 MED ORDER — ENOXAPARIN SODIUM 40 MG/0.4ML IJ SOSY
40.0000 mg | PREFILLED_SYRINGE | INTRAMUSCULAR | Status: DC
  Administered 2024-03-10 – 2024-03-12 (×3): 40 mg via SUBCUTANEOUS
  Filled 2024-03-10 (×3): qty 0.4

## 2024-03-10 MED ORDER — METOPROLOL TARTRATE 25 MG PO TABS
25.0000 mg | ORAL_TABLET | Freq: Two times a day (BID) | ORAL | Status: DC
  Administered 2024-03-10: 25 mg via ORAL
  Filled 2024-03-10: qty 1

## 2024-03-10 MED ORDER — METRONIDAZOLE 500 MG/100ML IV SOLN
500.0000 mg | Freq: Once | INTRAVENOUS | Status: AC
  Administered 2024-03-10: 500 mg via INTRAVENOUS
  Filled 2024-03-10: qty 100

## 2024-03-10 MED ORDER — PRAVASTATIN SODIUM 20 MG PO TABS
10.0000 mg | ORAL_TABLET | Freq: Every day | ORAL | Status: DC
  Administered 2024-03-11 – 2024-03-15 (×5): 10 mg via ORAL
  Filled 2024-03-10 (×5): qty 1

## 2024-03-10 MED ORDER — LACTULOSE ENEMA
300.0000 mL | Freq: Once | ORAL | Status: DC
  Filled 2024-03-10: qty 300

## 2024-03-10 MED ORDER — BENZONATATE 100 MG PO CAPS
200.0000 mg | ORAL_CAPSULE | Freq: Three times a day (TID) | ORAL | Status: DC | PRN
  Administered 2024-03-12 – 2024-03-14 (×3): 200 mg via ORAL
  Filled 2024-03-10 (×3): qty 2

## 2024-03-10 MED ORDER — ACETAMINOPHEN 10 MG/ML IV SOLN
1000.0000 mg | Freq: Once | INTRAVENOUS | Status: AC
  Administered 2024-03-10: 1000 mg via INTRAVENOUS
  Filled 2024-03-10: qty 100

## 2024-03-10 MED ORDER — SODIUM CHLORIDE 0.9 % IV SOLN
500.0000 mg | INTRAVENOUS | Status: AC
  Administered 2024-03-10 – 2024-03-12 (×3): 500 mg via INTRAVENOUS
  Filled 2024-03-10 (×5): qty 5

## 2024-03-10 MED ORDER — LACTATED RINGERS IV BOLUS: 500.0000 mL | Freq: Once | INTRAVENOUS | Status: DC

## 2024-03-10 NOTE — ED Triage Notes (Signed)
 Pt BIB [facility] with reports of ams and cough per daughter since today. Pt went to a class reunion yesterday and was fine.

## 2024-03-10 NOTE — ED Provider Notes (Signed)
 [place] EMERGENCY DEPARTMENT AT [facility] Provider Note   CSN: [id number] Arrival date & time: 03/10/24  0002     Patient presents with: Cough   Kari Coleman is a 73 y.o. female.   The history is provided by a relative and the patient. The history is limited by the condition of the patient (Altered mental status).  Cough  She has history of hypertension, GERD, hypothyroidism secondary to radioactive iodine  treatment, cervical cancer, heart failure with preserved ejection fraction, asthma, positive PPD and is brought in by family member because of altered mental status.  She was reported to be her normal self yesterday but was lethargic and confused today.  She has had a cough which appears to be productive of sputum which she swallows.  She has not had any known fevers.  There has been no vomiting or diarrhea.  There have been no known sick contacts.  Family member states that she has had problems like this in the past related to urinary tract infections.    Prior to Admission medications   Medication Sig Start Date End Date Taking? Authorizing Provider  acetaminophen  (TYLENOL ) 325 MG tablet Take 650 mg by mouth every 4 (four) hours as needed for mild pain or moderate pain.    [provider]  albuterol  (PROVENTIL  HFA) 108 (90 Base) MCG/ACT inhaler Inhale 2 puffs into the lungs every 6 (six) hours as needed for wheezing or shortness of breath. 05/31/21   Job Lukes, PA  albuterol  (PROVENTIL ) (2.5 MG/3ML) 0.083% nebulizer solution Take 3 mLs (2.5 mg total) by nebulization every 6 (six) hours as needed for wheezing or shortness of breath. 07/12/21   Job Lukes, PA  amLODipine  (NORVASC ) 10 MG tablet Take 1 tablet (10 mg total) by mouth daily. 05/17/23   Job Lukes, PA  brimonidine  (ALPHAGAN ) 0.2 % ophthalmic solution Place 1 drop into the right eye at bedtime. 10/20/22   [provider]  Cholecalciferol  (VITAMIN D -3) 25 MCG (1000 UT) CAPS Take  2,000 Units by mouth daily.    [provider]  cyanocobalamin  (VITAMIN B12) 1000 MCG tablet Take 1 tablet (1,000 mcg total) by mouth daily. 12/21/22   Vann, Jessica U, DO  latanoprost  (XALATAN ) 0.005 % ophthalmic solution Place 1 drop into both eyes at bedtime. 11/20/14   [provider]  levothyroxine  (SYNTHROID ) 88 MCG tablet Take 1 tablet (88 mcg total) by mouth daily before breakfast. 11/21/23   Trixie File, MD  lisinopril  (ZESTRIL ) 40 MG tablet TAKE 1 TABLET BY MOUTH DAILY 09/03/23   Job Lukes, PA  metoprolol  tartrate (LOPRESSOR ) 25 MG tablet Take 1 tablet (25 mg total) by mouth 2 (two) times daily. 11/06/23   Job Lukes, PA  nitrofurantoin, macrocrystal-monohydrate, (MACROBID) 100 MG capsule Take 100 mg by mouth 2 (two) times daily.    [provider]  ondansetron  (ZOFRAN ) 4 MG tablet Take 1 tablet (4 mg total) by mouth every 6 (six) hours. 06/26/23   Victor Lynwood DASEN, PA-C  pantoprazole  (PROTONIX ) 40 MG tablet Take 1 tablet (40 mg total) by mouth daily. 11/30/23   Job Lukes, PA  polyethylene glycol (MIRALAX  / GLYCOLAX ) 17 g packet Take 17 g by mouth daily. 01/03/22   Krishnan, Gokul, MD  pravastatin  (PRAVACHOL ) 10 MG tablet TAKE 1 TABLET BY MOUTH DAILY 09/03/23   Job Lukes, PA  senna-docusate (SENOKOT-S) 8.6-50 MG tablet Take 2 tablets by mouth 2 (two) times daily. 01/02/22   Krishnan, Gokul, MD  trimethoprim (TRIMPEX) 100 MG tablet  Take 100 mg by mouth daily. 07/19/23   [provider]    Allergies: Doxycycline , Codeine, and Pollen extract    Review of Systems  Unable to perform ROS: Mental status change  Respiratory:  Positive for cough.     Updated Vital Signs BP (!) 195/97 (BP Location: Left Arm)   Pulse 88   Temp 99.2 F (37.3 C) (Oral)   Resp 18   SpO2 93%   Physical Exam Vitals and nursing note reviewed.   73 year old female, resting comfortably and in no acute distress. Vital signs are significant for elevated  blood pressure. Oxygen saturation is 93%, which is normal. Head is normocephalic and atraumatic. PERRLA, EOMI. Oropharynx is clear. Neck is nontender and supple. Back is nontender and there is no CVA tenderness. Lungs are clear without rales, wheezes, or rhonchi. Chest is nontender. Heart has regular rate and rhythm without murmur. Abdomen is soft, flat, nontender. Extremities have no cyanosis or edema, full range of motion is present. Skin is warm and dry without rash. Neurologic: Sleepy but arousable, slow to answer questions.  Oriented to person and partly oriented to place (knows she is in a hospital but not which one), not oriented to day or month or year although she does know that Christmas is coming.  (all labs ordered are listed, but only abnormal results are displayed) Labs Reviewed  RESP PANEL BY RT-PCR (RSV, FLU A&B, COVID)  RVPGX2 - Abnormal; Notable for the following components:      Result Value   Influenza A by PCR POSITIVE (*)    All other components within normal limits  CBC WITH DIFFERENTIAL/PLATELET - Abnormal; Notable for the following components:   Lymphs Abs 0.3 (*)    All other components within normal limits  COMPREHENSIVE METABOLIC PANEL WITH GFR - Abnormal; Notable for the following components:   Glucose, Bld 173 (*)    Creatinine, Ser 1.09 (*)    Total Protein 8.7 (*)    Alkaline Phosphatase 135 (*)    GFR, Estimated 53 (*)    All other components within normal limits  I-STAT CG4 LACTIC ACID, ED - Abnormal; Notable for the following components:   Lactic Acid, Venous 2.2 (*)    All other components within normal limits  CULTURE, BLOOD (ROUTINE X 2)  CULTURE, BLOOD (ROUTINE X 2)  URINE CULTURE  URINALYSIS, ROUTINE W REFLEX MICROSCOPIC    EKG: None  Radiology: DG Chest Portable 1 View Result Date: 03/10/2024 EXAM: 1 VIEW(S) XRAY OF THE CHEST 03/10/2024 12:31:00 AM COMPARISON: 07/24/2023 CLINICAL HISTORY: cough FINDINGS: LUNGS AND PLEURA: Low lung  volume. Increased interstitial markings, nonspecific but may represent mild pulmonary edema. No pleural effusion. No pneumothorax. HEART AND MEDIASTINUM: Aortic arch calcifications. The cardiomediastinal silhouette is unchanged in the setting of patient positioning and AP portable technique. No acute abnormality of the cardiac and mediastinal silhouettes. BONES AND SOFT TISSUES: No acute osseous abnormality. IMPRESSION: 1. Increased interstitial markings, nonspecific but may represent mild pulmonary edema. 2. Low lung volume. Electronically signed by: Morgane Naveau MD 03/10/2024 12:40 AM EST RP Workstation: [id number]    Cardiac monitor shows normal sinus rhythm, per my interpretation. Procedures   Medications Ordered in the ED  lactated ringers  infusion (has no administration in time range)  ceFEPIme  (MAXIPIME ) 2 g in sodium chloride  0.9 % 100 mL IVPB (2 g Intravenous New Bag/Given 03/10/24 0148)  metroNIDAZOLE  (FLAGYL ) IVPB 500 mg (has no administration in time range)  lactated ringers  bolus 1,000 mL (1,000  mLs Intravenous New Bag/Given 03/10/24 0147)    And  lactated ringers  bolus 1,000 mL (has no administration in time range)    And  lactated ringers  bolus 1,000 mL (has no administration in time range)  oseltamivir  (TAMIFLU ) capsule 75 mg (has no administration in time range)  vancomycin  (VANCOREADY) IVPB 2000 mg/400 mL (has no administration in time range)                                    Medical Decision Making Amount and/or Complexity of Data Reviewed Labs: ordered. Radiology: ordered.  Risk Prescription drug management. Decision regarding hospitalization.   Altered mental status.  This is a presentation with wide range of treatment options and carries with it a high risk of morbidity and complications.  Differential diagnosis includes, but is not limited to, occult infection, electrolyte disturbance, hyper or hypoglycemia, medication side effect.  Chest x-ray shows no  evidence of pneumonia but increased interstitial markings consistent with mild edema.  Have independently viewed the images, and agree with radiologist's interpretation.  I have reviewed her laboratory tests, and my interpretation is normal CBC, elevated random glucose level, borderline elevated creatinine which is unchanged from baseline, mild elevation of alkaline phosphatase of uncertain clinical significance, mildly elevated lactic acid level concerning for sepsis, positive PCR for influenza A with negative PCR for influenza B and RSV and COVID-19.  Urinalysis is still pending.  At this point, it is probable that her symptoms are all secondary to influenza.  However, with elevated lactic acid level, I do feel it is necessary to treat with empiric antibiotics until cultures come back negative.  I have ordered antibiotics for infection of unknown source and I will also ordered early goal-directed fluids.    Urinalysis shows no evidence of infection.  Repeat lactic acid level is unchanged, but was obtained before completing her early goal-directed fluids.  Lactic acid level will need to be repeated following completion of early goal-directed fluids.  I have discussed case with Dr.  Marcene of [facility],  who agrees to admit the patient.  CRITICAL CARE Performed by: Alm Lias Total critical care time: 50 minutes Critical care time was exclusive of separately billable procedures and treating other patients. Critical care was necessary to treat or prevent imminent or life-threatening deterioration. Critical care was time spent personally by me on the following activities: development of treatment plan with patient and/or surrogate as well as nursing, discussions with consultants, evaluation of patient's response to treatment, examination of patient, obtaining history from patient or surrogate, ordering and performing treatments and interventions, ordering and review of laboratory studies, ordering  and review of radiographic studies, pulse oximetry and re-evaluation of patient's condition.     Final diagnoses:  Sepsis due to undetermined organism (HCC)  Influenza A  Elevated random blood glucose level  Elevated alkaline phosphatase level    ED Discharge Orders     None          Lias Alm, MD 03/10/24 [phone]

## 2024-03-10 NOTE — Sepsis Progress Note (Signed)
 Elink following code sepsis

## 2024-03-10 NOTE — Progress Notes (Signed)
 Pt lethargic on AM assessment. VS taken and reported to MD, pt wheezy and crackly, fluids paused, ABG ordered and IV tylenol  ordered for temp b/c pt unable to take PO meds at this time. Will monitor closely.    03/10/24 0751  Assess: MEWS Score  Temp (!) 102.3 F (39.1 C)  BP (!) 165/79  MAP (mmHg) 103  Pulse Rate 80  Level of Consciousness Responds to Pain  SpO2 91 %  O2 Device Room Air  Assess: MEWS Score  MEWS Temp 2  MEWS Systolic 0  MEWS Pulse 0  MEWS RR 1  MEWS LOC 2  MEWS Score 5  MEWS Score Color Red  Assess: if the MEWS score is Yellow or Red  Were vital signs accurate and taken at a resting state? Yes  Does the patient meet 2 or more of the SIRS criteria? Yes  Does the patient have a confirmed or suspected source of infection? Yes  MEWS guidelines implemented  Yes, red  Treat  MEWS Interventions Considered administering scheduled or prn medications/treatments as ordered  Take Vital Signs  Increase Vital Sign Frequency  Red: Q1hr x2, continue Q4hrs until patient remains green for 12hrs  Escalate  MEWS: Escalate Red: Discuss with charge nurse and notify provider. Consider notifying RRT. If remains red for 2 hours consider need for higher level of care  Notify: Charge Nurse/RN  Name of Charge Nurse/RN Notified Margery Cary, RN  Provider Notification  Provider Name/Title Regalado, B. MD  Date Provider Notified 03/10/24  Time Provider Notified [phone]  Method of Notification Page  Notification Reason Change in status  Provider response See new orders  Date of Provider Response 03/10/24  Time of Provider Response 0800  Assess: SIRS CRITERIA  SIRS Temperature  1  SIRS Respirations  1  SIRS Pulse 0  SIRS WBC 0  SIRS Score Sum  2

## 2024-03-10 NOTE — Progress Notes (Addendum)
 PROGRESS NOTE    Kari Coleman  FMW:[id number] DOB: [birth year] DOA: 03/10/2024 PCP: Job Lukes, PA   Brief Narrative: 73 year old with past medical history significant for essential hypertension, hypothyroidism, hyperlipidemia who presented 12/8 with acute metabolic encephalopathy found to have influenza A+.  History was obtained by family member, over the last 1 or 2 days patient has excepted evidence of confusion, not oriented to year month, patient has also had cough and vomiting.  Evaluation in the ED patient was found to be febrile temperature 100, tachycardic heart rate 100 respiration rate 22 oxygen saturation 93-98 on room air.  Liver function test normal elevated lactic acid 2.2 white blood cell 8.1 hemoglobin 15 influenza AB PCR positive COVID-negative.  Chest x-ray show evidence of increased interstitial marking no pleural effusion.  Patient admitted for acute metabolic encephalopathy and influenza A+.   Assessment & Plan:   Principal Problem:   Acute metabolic encephalopathy Active Problems:   Acquired hypothyroidism   Influenza A   Lactic acidosis   History of essential hypertension   HLD (hyperlipidemia)   1-Acute Metabolic Encephalopathy: - Patient presented with altered mental status, confusion, somnolence, not oriented x 3. - Likely in the setting of febrile illness and influenza. - UA negative Patient seen this morning and was unresponsive , she had a temperature at that time of 102. ABG showed normal pH.  I did subsequently follow-up on patient in the afternoon and she was  alert and and answer a few questions but  2-Influenza A PNA Sepsis secondary to pneumonia, influenza.  Patient tachycardic temperature 102, respiration 20s, heart rate 111 - Patient presented with cough, fever confusion influenza A PCR positive. - Continue Tamiflu  - Follow-up blood cultures - Added IV ceftriaxone  and azithromycin , cover for superimposed bacterial infection - schedule   albuterol -Ipratropium - Transfer to stepdown  Lactic acidosis; in the setting of infection, IV bolus ordered She does have some pulmonary congestion on chest x-ray, will be cautious with IV fluid  Hypophosphatemia: Replete oral  Essential hypertension: - Continue with amlodipine  and metoprolol   Acquired hypothyroidism: -Continue home Synthroid   Hyperlipidemia: Continue pravastatin      Estimated body mass index is 34.08 kg/m as calculated from the following:   Height as of 12/24/23: 5' 7 (1.702 m).   Weight as of this encounter: 98.7 kg.   DVT prophylaxis: Lovenox  Code Status: Full code Family Communication: Daughter updated.  Disposition Plan:  Status is: Inpatient Remains inpatient appropriate because: Management of influenza, sepsis    Consultants:  none  Procedures:  none  Antimicrobials:    Subjective: Patient seen this morning was lethargic, not responding to question, responded to painful stimuli, found to have a temperature of 102   Objective: Vitals:   03/10/24 0245 03/10/24 0409 03/10/24 0456 03/10/24 0513  BP: (!) 167/85   (!) 168/80  Pulse: 96   91  Resp: (!) 22     Temp:  (!) 100.5 F (38.1 C)  100 F (37.8 C)  TempSrc:  Oral  Oral  SpO2: 93%   98%  Weight:   98.7 kg     Intake/Output Summary (Last 24 hours) at 03/10/2024 0713 Last data filed at 03/10/2024 0544 Gross per 24 hour  Intake 3602.03 ml  Output --  Net 3602.03 ml   Filed Weights   03/10/24 0456  Weight: 98.7 kg    Examination:  General exam: Appears calm and comfortable  Respiratory system:  decreased breath sounds, sporadic wheezing Cardiovascular system: S1 & S2 heard,  RRR.  Gastrointestinal system: Abdomen is nondistended, soft and nontender. No organomegaly or masses felt. Normal bowel sounds heard. Central nervous system: Lethargic not responsive Extremities: Symmetric 5 x 5 power.   Data Reviewed: I have personally reviewed following labs and imaging  studies  CBC: Recent Labs  Lab 03/10/24 0028 03/10/24 0513  WBC 8.1 9.1  NEUTROABS 7.4 8.0*  HGB 15.0 13.9  HCT 45.2 42.9  MCV 88.8 91.9  PLT 248 214   Basic Metabolic Panel: Recent Labs  Lab 03/10/24 0028 03/10/24 0513  NA 137 138  K 4.1 4.1  CL 101 101  CO2 24 25  GLUCOSE 173* 129*  BUN 15 14  CREATININE 1.09* 1.02*  CALCIUM 10.3 10.0  MG  --  1.9  PHOS  --  2.1*   GFR: Estimated Creatinine Clearance: 57.5 mL/min (A) (by C-G formula based on SCr of 1.02 mg/dL (H)). Liver Function Tests: Recent Labs  Lab 03/10/24 0028  AST 25  ALT 21  ALKPHOS 135*  BILITOT 0.7  PROT 8.7*  ALBUMIN 4.5   No results for input(s): LIPASE, AMYLASE in the last 168 hours. No results for input(s): AMMONIA in the last 168 hours. Coagulation Profile: No results for input(s): INR, PROTIME in the last 168 hours. Cardiac Enzymes: No results for input(s): CKTOTAL, CKMB, CKMBINDEX, TROPONINI in the last 168 hours. BNP (last 3 results) No results for input(s): PROBNP in the last 8760 hours. HbA1C: No results for input(s): HGBA1C in the last 72 hours. CBG: No results for input(s): GLUCAP in the last 168 hours. Lipid Profile: No results for input(s): CHOL, HDL, LDLCALC, TRIG, CHOLHDL, LDLDIRECT in the last 72 hours. Thyroid  Function Tests: No results for input(s): TSH, T4TOTAL, FREET4, T3FREE, THYROIDAB in the last 72 hours. Anemia Panel: No results for input(s): VITAMINB12, FOLATE, FERRITIN, TIBC, IRON, RETICCTPCT in the last 72 hours. Sepsis Labs: Recent Labs  Lab 03/10/24 0031 03/10/24 0304 03/10/24 0513  LATICACIDVEN 2.2* 2.2* 3.1*    Recent Results (from the past 240 hours)  Resp panel by RT-PCR (RSV, Flu A&B, Covid) Anterior Nasal Swab     Status: Abnormal   Collection Time: 03/10/24 12:27 AM   Specimen: Anterior Nasal Swab  Result Value Ref Range Status   SARS Coronavirus 2 by RT PCR NEGATIVE NEGATIVE Final     Comment: (NOTE) SARS-CoV-2 target nucleic acids are NOT DETECTED.  The SARS-CoV-2 RNA is generally detectable in upper respiratory specimens during the acute phase of infection. The lowest concentration of SARS-CoV-2 viral copies this assay can detect is 138 copies/mL. A negative result does not preclude SARS-Cov-2 infection and should not be used as the sole basis for treatment or other patient management decisions. A negative result may occur with  improper specimen collection/handling, submission of specimen other than nasopharyngeal swab, presence of viral mutation(s) within the areas targeted by this assay, and inadequate number of viral copies(<138 copies/mL). A negative result must be combined with clinical observations, patient history, and epidemiological information. The expected result is Negative.  Fact Sheet for Patients:  bloggercourse.com  Fact Sheet for Healthcare Providers:  seriousbroker.it  This test is no t yet approved or cleared by the [place]  FDA and  has been authorized for detection and/or diagnosis of SARS-CoV-2 by FDA under an Emergency Use Authorization (EUA). This EUA will remain  in effect (meaning this test can be used) for the duration of the COVID-19 declaration under Section 564(b)(1) of the Act, 21 U.S.C.section 360bbb-3(b)(1), unless the authorization is terminated  or revoked sooner.       Influenza A by PCR POSITIVE (A) NEGATIVE Final   Influenza B by PCR NEGATIVE NEGATIVE Final    Comment: (NOTE) The Xpert Xpress SARS-CoV-2/FLU/RSV plus assay is intended as an aid in the diagnosis of influenza from Nasopharyngeal swab specimens and should not be used as a sole basis for treatment. Nasal washings and aspirates are unacceptable for Xpert Xpress SARS-CoV-2/FLU/RSV testing.  Fact Sheet for Patients: bloggercourse.com  Fact Sheet for Healthcare  Providers: seriousbroker.it  This test is not yet approved or cleared by the [place]  FDA and has been authorized for detection and/or diagnosis of SARS-CoV-2 by FDA under an Emergency Use Authorization (EUA). This EUA will remain in effect (meaning this test can be used) for the duration of the COVID-19 declaration under Section 564(b)(1) of the Act, 21 U.S.C. section 360bbb-3(b)(1), unless the authorization is terminated or revoked.     Resp Syncytial Virus by PCR NEGATIVE NEGATIVE Final    Comment: (NOTE) Fact Sheet for Patients: bloggercourse.com  Fact Sheet for Healthcare Providers: seriousbroker.it  This test is not yet approved or cleared by the [place]  FDA and has been authorized for detection and/or diagnosis of SARS-CoV-2 by FDA under an Emergency Use Authorization (EUA). This EUA will remain in effect (meaning this test can be used) for the duration of the COVID-19 declaration under Section 564(b)(1) of the Act, 21 U.S.C. section 360bbb-3(b)(1), unless the authorization is terminated or revoked.  Performed at [facility], [address] [place], [place] [address]          Radiology Studies: DG CHEST PORT 1 VIEW Result Date: 03/10/2024 EXAM: 1 VIEW(S) XRAY OF THE CHEST 03/10/2024 04:25:00 AM COMPARISON: 03/10/2024 CLINICAL HISTORY: Intractable nausea and vomiting FINDINGS: LUNGS AND PLEURA: Low lung volumes. Pulmonary vascular congestion noted without overt airspace pulmonary edema. Asymmetric elevation in right hemidiaphragm. No focal pulmonary opacity. No pleural effusion. No pneumothorax. HEART AND MEDIASTINUM: Cardiomegaly. Calcified aorta. BONES AND SOFT TISSUES: No acute osseous abnormality. IMPRESSION: 1. Cardiomegaly with pulmonary vascular congestion, consistent with mild congestive heart failure physiology. 2. Asymmetric elevation of the right  hemidiaphragm. 3. Calcified aorta, compatible with atherosclerosis. Electronically signed by: Camellia Candle MD 03/10/2024 04:32 AM EST RP Workstation: [id number]   DG Chest Portable 1 View Result Date: 03/10/2024 EXAM: 1 VIEW(S) XRAY OF THE CHEST 03/10/2024 12:31:00 AM COMPARISON: 07/24/2023 CLINICAL HISTORY: cough FINDINGS: LUNGS AND PLEURA: Low lung volume. Increased interstitial markings, nonspecific but may represent mild pulmonary edema. No pleural effusion. No pneumothorax. HEART AND MEDIASTINUM: Aortic arch calcifications. The cardiomediastinal silhouette is unchanged in the setting of patient positioning and AP portable technique. No acute abnormality of the cardiac and mediastinal silhouettes. BONES AND SOFT TISSUES: No acute osseous abnormality. IMPRESSION: 1. Increased interstitial markings, nonspecific but may represent mild pulmonary edema. 2. Low lung volume. Electronically signed by: Morgane Naveau MD 03/10/2024 12:40 AM EST RP Workstation: [id number]        Scheduled Meds:  amLODipine   10 mg Oral Daily   guaiFENesin   600 mg Oral BID   levothyroxine   88 mcg Oral Q0600   metoprolol  tartrate  25 mg Oral BID   oseltamivir   30 mg Oral BID   pravastatin   10 mg Oral Daily   Continuous Infusions:  lactated ringers        LOS: 0 days    Time spent: 35 Minutes    Xan Sparkman A Anavictoria Wilk, MD [facility]   If 7PM-7AM, please contact night-coverage [email or web address]  03/10/2024, 7:13 AM

## 2024-03-10 NOTE — Progress Notes (Signed)
 PT has order for Flutter PRN- PT unable to perform at this time.  ABG has been ordered.

## 2024-03-10 NOTE — Progress Notes (Signed)
 PHARMACY NOTE:  ANTIMICROBIAL RENAL DOSAGE ADJUSTMENT  Current antimicrobial regimen includes a mismatch between antimicrobial dosage and estimated renal function.  As per policy approved by the Pharmacy & Therapeutics and Medical Executive Committees, the antimicrobial dosage will be adjusted accordingly.  Current antimicrobial dosage:  Tamiflu  75mg  PO BID  Indication: +influenza A  Renal Function:  Estimated Creatinine Clearance: 57.5 mL/min (A) (by C-G formula based on SCr of 1.02 mg/dL (H)). []      On intermittent HD, scheduled: []      On CRRT    Antimicrobial dosage has been changed to:  Tamiflu  30mg  PO BID  Additional comments:   Thank you for allowing pharmacy to be a part of this patient's care.  Rosaline Millet, [facility] 03/10/2024 5:58 AM

## 2024-03-10 NOTE — Progress Notes (Signed)
 Notified Lab (Tom) that ABG being sent for analysis.

## 2024-03-10 NOTE — ED Notes (Signed)
 Pt. I-stat CG4 Lactic Acid results 2.2. EDP, Raford made aware.

## 2024-03-10 NOTE — H&P (Signed)
 History and Physical      Kari Coleman FMW:[id number] DOB: 09-01-1950 DOA: 03/10/2024; DOS: 03/10/2024  PCP: Job Lukes, PA  Patient coming from: home   I have personally briefly reviewed patient's old medical records in [facility]  Chief Complaint: Altered mental status  HPI: Kari Coleman is a 73 y.o. female with medical history significant for essential pretension, acquired hypothyroidism, hyperlipidemia, who is admitted to [facility] on 03/10/2024 with suspected acute metabolic encephalopathy in the setting of influenza A infection after presenting from home to [facility] ED complaining of altered mental status.   In the setting of the patient's altered mental status, the following history is provided by the patient's daughter in addition to my discussions with the EDP and via chart review.  Patient's daughter conveys that over the last 1 to 2 days, the patient has exhibited evidence of altered mental status, confusion relative to baseline mental status.  Specifically, a patient is unsure as to the current month and year, and is also unable to identify the current [place]  president, which the daughter conveys the patient would be able to accurately answer these questions at baseline.  The patient has also developed a new onset cough over the course of the last day, with the patient denying any associated shortness of breath.  She has however noted some intermittent nausea resulting in at least 1 episode of nonbloody, nonbilious emesis.  Daughter conveys that the patient's presenting altered mental status is similar to that which she has experienced at times of previous presenting infectious processes, including a history of recurrent urinary tract infections.  Leading up to development of this altered mental status, the patient has recently met with members of her high school class.  Unclear if there are any sick contacts as a consequence of this meeting.    ED Course:  Vital  signs in the ED were notable for the following: Temperature max 100.5 (38.1 C); heart rates in the 80s to low 100s; systolic blood pressures in the 160s 170s; respiratory rate 18-22, oxygen saturation 93 to 98% on room air.  Labs were notable for the following: CMP was notable for the following: Sodium 137, bicarbonate 24, anion gap 13, creatinine 1.09, glucose 173, calcium 10.3,  albumin 4.5.  Otherwise, liver enzymes within normal limits.  Initial lactate 2.2, with repeat lactate unchanged.  CBC notable for white blood cell count 8100, hemoglobin 15, platelet count 248.  Urinalysis showed no white blood cells and was leukocyte esterase/nitrate negative.  Blood cultures x 2 were collected in the ED prior to initiation of IV antibiotics.  Influenza A PCR was positive, will influenza B PCR as well as RSV/COVID-19 PCR were all negative.  Per my interpretation, EKG in ED demonstrated the following: No EKG performed in the ED today.  Imaging in the ED, per corresponding formal radiology read, was notable for the following: 1 view chest x-ray showed evidence of increased interstitial markings, but without evidence of interstitial or airspace opacities, no evidence of infiltrate, pleural effusion, or pneumothorax.  While in the ED, the following were administered: Tamiflu ; in the setting of initial concern for presenting sepsis of unclear source, patient initially received undifferentiated broad-spectrum IV antibiotics in the form of cefepime , IV Flagyl , IV vancomycin ; tribes received lactated Ringer 's times further bolus followed by initiation of continuous lactated Ringer 's at 150 cc/h.  Subsequently, the patient was admitted for further evaluation management of suspected acute metabolic encephalopathy in setting of influenza A infection.  Review of Systems: As per HPI otherwise 10 point review of systems negative.   Past Medical History:  Diagnosis Date   Allergy    Arthritis    Cataract     Cervical cancer (HCC)    GERD (gastroesophageal reflux disease)    Glaucoma 2017   History of echocardiogram    Echo 9/16:  Mild LVH, EF 55-60%, no RWMA, Gr 1 DD, mild MR // Echo 1/17:  Mild LVH, EF 50-55%, Gr 1 DD, mild to mod MR, mild LAE   Hypertension    Hyperthyroidism    s/p RAI treatment   Palpitations    PONV (postoperative nausea and vomiting)    years ago  . Woke up and saw asian people    PONV (postoperative nausea and vomiting) 07/18/2021   Retinal detachment    L eye - partial blindness   Tobacco abuse    Tuberculosis    positive test as a caregiver cxr annually    Urinary incontinence     Past Surgical History:  Procedure Laterality Date   ABDOMINAL HYSTERECTOMY  1984   bladder surgey for incontinence     EYE SURGERY     REPAIR OF COMPLEX TRACTION RETINAL DETACHMENT     thyroid  radiation     TOTAL ABDOMINAL HYSTERECTOMY W/ BILATERAL SALPINGOOPHORECTOMY     TOTAL KNEE ARTHROPLASTY Right 04/22/2019   Procedure: RIGHT TOTAL KNEE ARTHROPLASTY;  Surgeon: Sheril Coy, MD;  Location: [facility];  Service: Orthopedics;  Laterality: Right;  PENDING PRE-OP FOR SDDC APPROVAL    Social History:  reports that she quit smoking about 7 years ago. Her smoking use included cigarettes. She started smoking about 55 years ago. She has a 24 pack-year smoking history. She has never used smokeless tobacco. She reports that she does not currently use alcohol. She reports that she does not use drugs.   Allergies  Allergen Reactions   Doxycycline  Nausea Only   Codeine Nausea And Vomiting   Pollen Extract Itching and Other (See Comments)    Congestion, itchy eyes, runny nose    Family History  Problem Relation Age of Onset   Hypertension Mother    Heart failure Mother    Diabetes Mother    Pancreatic cancer Father    Colon cancer Maternal Aunt    Cancer Sister    Thyroid  disease Paternal Aunt    Cancer Paternal Aunt    Thyroid  disease Other        paternal side   Cancer  Maternal Grandfather    Diabetes Paternal Grandmother    Diabetes Maternal Uncle    Colon polyps Neg Hx    Stomach cancer Neg Hx     Family history reviewed and not pertinent    Prior to Admission medications   Medication Sig Start Date End Date Taking? Authorizing Provider  acetaminophen  (TYLENOL ) 325 MG tablet Take 650 mg by mouth every 4 (four) hours as needed for mild pain or moderate pain.    [provider]  albuterol  (PROVENTIL  HFA) 108 (90 Base) MCG/ACT inhaler Inhale 2 puffs into the lungs every 6 (six) hours as needed for wheezing or shortness of breath. 05/31/21   Job Lukes, PA  albuterol  (PROVENTIL ) (2.5 MG/3ML) 0.083% nebulizer solution Take 3 mLs (2.5 mg total) by nebulization every 6 (six) hours as needed for wheezing or shortness of breath. 07/12/21   Job Lukes, PA  amLODipine  (NORVASC ) 10 MG tablet Take 1 tablet (10 mg total) by mouth daily. 05/17/23  Job Lukes, [name]  brimonidine  (ALPHAGAN ) 0.2 % ophthalmic solution Place 1 drop into the right eye at bedtime. 10/20/22   [provider]  Cholecalciferol  (VITAMIN D -3) 25 MCG (1000 UT) CAPS Take 2,000 Units by mouth daily.    [provider]  cyanocobalamin  (VITAMIN B12) 1000 MCG tablet Take 1 tablet (1,000 mcg total) by mouth daily. 12/21/22   Vann, Jessica U, DO  latanoprost  (XALATAN ) 0.005 % ophthalmic solution Place 1 drop into both eyes at bedtime. 11/20/14   [provider]  levothyroxine  (SYNTHROID ) 88 MCG tablet Take 1 tablet (88 mcg total) by mouth daily before breakfast. 11/21/23   Trixie File, MD  lisinopril  (ZESTRIL ) 40 MG tablet TAKE 1 TABLET BY MOUTH DAILY 09/03/23   Job Lukes, PA  metoprolol  tartrate (LOPRESSOR ) 25 MG tablet Take 1 tablet (25 mg total) by mouth 2 (two) times daily. 11/06/23   Job Lukes, PA  nitrofurantoin, macrocrystal-monohydrate, (MACROBID) 100 MG capsule Take 100 mg by mouth 2 (two) times daily.    [provider]   ondansetron  (ZOFRAN ) 4 MG tablet Take 1 tablet (4 mg total) by mouth every 6 (six) hours. 06/26/23   Victor Lynwood DASEN, PA-C  pantoprazole  (PROTONIX ) 40 MG tablet Take 1 tablet (40 mg total) by mouth daily. 11/30/23   Job Lukes, PA  polyethylene glycol (MIRALAX  / GLYCOLAX ) 17 g packet Take 17 g by mouth daily. 01/03/22   Krishnan, Gokul, MD  pravastatin  (PRAVACHOL ) 10 MG tablet TAKE 1 TABLET BY MOUTH DAILY 09/03/23   Job Lukes, PA  senna-docusate (SENOKOT-S) 8.6-50 MG tablet Take 2 tablets by mouth 2 (two) times daily. 01/02/22   Krishnan, Gokul, MD  trimethoprim (TRIMPEX) 100 MG tablet Take 100 mg by mouth daily. 07/19/23   [provider]     Objective    Physical Exam: Vitals:   03/10/24 0013 03/10/24 0200 03/10/24 0215 03/10/24 0245  BP: (!) 195/97 (!) 185/80 (!) 177/87 (!) 167/85  Pulse: 88 90 (!) 103 96  Resp: 18 (!) 22 19 (!) 22  Temp: 99.2 F (37.3 C)     TempSrc: Oral     SpO2: 93% 93% 93% 93%    General: appears to be stated age; somnolent, confused Skin: warm, dry, no rash Head:  AT/[place] Mouth:  Oral mucosa membranes appear dry, normal dentition Neck: supple; trachea midline Heart:  RRR; did not appreciate any M/R/G Lungs: CTAB, did not appreciate any wheezes, rales, or rhonchi Abdomen: + BS; soft, ND, NT Vascular: 2+ pedal pulses b/l; 2+ radial pulses b/l Extremities: no peripheral edema, no muscle wasting   Labs on Admission: I have personally reviewed following labs and imaging studies  CBC: Recent Labs  Lab 03/10/24 0028  WBC 8.1  NEUTROABS 7.4  HGB 15.0  HCT 45.2  MCV 88.8  PLT 248   Basic Metabolic Panel: Recent Labs  Lab 03/10/24 0028  NA 137  K 4.1  CL 101  CO2 24  GLUCOSE 173*  BUN 15  CREATININE 1.09*  CALCIUM 10.3   GFR: CrCl cannot be calculated (Unknown ideal weight.). Liver Function Tests: Recent Labs  Lab 03/10/24 0028  AST 25  ALT 21  ALKPHOS 135*  BILITOT 0.7  PROT 8.7*  ALBUMIN 4.5   No results for  input(s): LIPASE, AMYLASE in the last 168 hours. No results for input(s): AMMONIA in the last 168 hours. Coagulation Profile: No results for input(s): INR, PROTIME in the last 168 hours. Cardiac Enzymes: No results for input(s): CKTOTAL, CKMB, CKMBINDEX,  TROPONINI in the last 168 hours. BNP (last 3 results) No results for input(s): PROBNP in the last 8760 hours. HbA1C: No results for input(s): HGBA1C in the last 72 hours. CBG: No results for input(s): GLUCAP in the last 168 hours. Lipid Profile: No results for input(s): CHOL, HDL, LDLCALC, TRIG, CHOLHDL, LDLDIRECT in the last 72 hours. Thyroid  Function Tests: No results for input(s): TSH, T4TOTAL, FREET4, T3FREE, THYROIDAB in the last 72 hours. Anemia Panel: No results for input(s): VITAMINB12, FOLATE, FERRITIN, TIBC, IRON, RETICCTPCT in the last 72 hours. Urine analysis:    Component Value Date/Time   COLORURINE YELLOW 03/10/2024 0047   APPEARANCEUR CLEAR 03/10/2024 0047   LABSPEC 1.016 03/10/2024 0047   PHURINE 7.0 03/10/2024 0047   GLUCOSEU NEGATIVE 03/10/2024 0047   GLUCOSEU NEGATIVE 08/14/2023 1627   HGBUR NEGATIVE 03/10/2024 0047   BILIRUBINUR NEGATIVE 03/10/2024 0047   BILIRUBINUR Neg 02/18/2021 1455   KETONESUR NEGATIVE 03/10/2024 0047   PROTEINUR NEGATIVE 03/10/2024 0047   UROBILINOGEN 0.2 08/14/2023 1627   NITRITE NEGATIVE 03/10/2024 0047   LEUKOCYTESUR NEGATIVE 03/10/2024 0047    Radiological Exams on Admission: DG Chest Portable 1 View Result Date: 03/10/2024 EXAM: 1 VIEW(S) XRAY OF THE CHEST 03/10/2024 12:31:00 AM COMPARISON: 07/24/2023 CLINICAL HISTORY: cough FINDINGS: LUNGS AND PLEURA: Low lung volume. Increased interstitial markings, nonspecific but may represent mild pulmonary edema. No pleural effusion. No pneumothorax. HEART AND MEDIASTINUM: Aortic arch calcifications. The cardiomediastinal silhouette is unchanged in the setting of patient positioning  and AP portable technique. No acute abnormality of the cardiac and mediastinal silhouettes. BONES AND SOFT TISSUES: No acute osseous abnormality. IMPRESSION: 1. Increased interstitial markings, nonspecific but may represent mild pulmonary edema. 2. Low lung volume. Electronically signed by: Morgane Naveau MD 03/10/2024 12:40 AM EST RP Workstation: [id number]      Assessment/Plan    Principal Problem:   Acute metabolic encephalopathy Active Problems:   Acquired hypothyroidism   Influenza A virus present   Lactic acidosis   History of essential hypertension   HLD (hyperlipidemia)      #) Acute metabolic encephalopathy: 1 to 2 days of altered mental status, confusion, somnolence relative to baseline mental status, including inability to correct identify the correct year and month and current [place]  president, all of which the patient would be able to accurately identify at baseline.  This appears to be suggestive of metabolic encephalopathy, on the basis of physiologic stressors stemming from presenting acute infection in the form of influenza A infection, as further detailed below.  No obvious additional contributory underlying infectious process at this time, including presenting UA that is not suggestive of UTI, while present chest x-ray shows no evidence of infiltrate to suggest pneumonia.  Will add on procalcitonin level to further assess.  No additional overt metabolic/electrolyte contributions at this time.  No obvious contributory pharmacologic factors. No overt acute focal neurologic deficits to suggest a contribution from an underlying acute CVA. Will check VBG to evaluate for any contribution from hypercapnic encephalopathy.   Plan: fall precautions. Delirium precautions. Repeat CMP/CBC in the AM. Check Mg level. check TSH, B12 level. Check vbg.  Check ammonia level, INR, procalcitonin level.  Check CPK level, particular in the setting of outpatient use of statin.  Further evaluation  management of presenting influenza A infection, as below.                     # (influenza A infection: In the setting of new onset cough over the last 1-2 days,  elevated temperature, presenting altered mental status, influenza A PCR is found to be positive, We will noting that presenting chest x-ray shows no evidence of infiltrate to suggest pneumonia.  Potential preceding sick contact in [place] with her high school classmates, as above.  While patient does have an elevated temperature, temperature max of 100.5, 38.1 Celsius, does not meet quantitative threshold of greater than 38.3 C for inclusion in SIRS criteria.  Additionally, white blood cell count was found to be within normal limits.  Consequently, in the absence of temperature greater than 38.3 Celsius, in the absence of leukocytosis, SIRS criteria are not met for sepsis at this time.  Additionally, no overt evidence of underlying bacterial infection at this time.  Consequently, we will refrain from additional IV antibiotics at this time, but rather continue Tamiflu  as it appears that the patient's symptoms started within the last 1 to 2 days.  Of note, urinalysis showed no evidence of urinary tract infection.   Plan: Prn acetaminophen  for fever.  Monitor on telemetry.  Monitor strict I's and O's and daily weights.  Continue Tamiflu , but at reduced dose.  Reduce rate of IV fluids from 150 cc/h to 100 cc/h.  Follow-up result of repeat lactic acid level.  Follow-up results blood cultures x 2.  Add on procalcitonin level.  Repeat CBC in the morning.  Flutter valve.  Prn Tessalon  Perles.  Scheduled guaifenesin .                #) Lactic acidosis: Mildly elevated lactic acid of 2.2, with repeat value unchanged.  Suspect that this is multifactorial in etiology, with presenting underlying infection in the form of influenza A infection, while on allergy med service criteria for sepsis are not currently met, as further  detailed above.  No evidence of additional underlying infectious process at this time.  Will pursue further evaluation of presenting lactic acidosis, including further trending and considering lactate, as outlined below.  Plan: Reduce rate of continuous IV fluids to 100 cc/h.  Repeat lactic acid level with morning labs.  Further evaluation management of presenting influenza A infection.  Add on procalcitonin level.  Check INR to evaluate hepatic synthetic function.  In the setting of outpatient use of statin, add on CPK level.  Repeat CMP, CBC in the morning.                      #) Essential Hypertension: documented h/o such, with outpatient antihypertensive regimen including Lopressor , lisinopril , Norvasc .  SBP's in the ED today: 160s 170s mmHg. in the setting of presenting acute infectious process, will be conservative with resumption of outpatient hypertensive medications, as below.  Plan: Close monitoring of subsequent BP via routine VS. home Lopressor  and Norvasc .  Hold next dose of lisinopril .  Monitor strict I's and O's and daily weights.  Repeat CMP in the morning.  Monitor on telemetry.                        #) acquired hypothyroidism: documented h/o such, on Synthroid  as outpatient.   Plan: cont home Synthroid .  Check TSH level in the setting of presenting acute encephalopathy, as above.                        #) Hyperlipidemia: documented h/o such. On pravastatin  as outpatient.   Plan: continue home statin. Follow for result of CPK level.  DVT prophylaxis: SCD's   Code Status: Full code Family Communication: none Disposition Plan: Per Rounding Team Consults called: none;  Admission status: inpatient    I SPENT GREATER THAN 75  MINUTES IN CLINICAL CARE TIME/MEDICAL DECISION-MAKING IN COMPLETING THIS ADMISSION.     Orbie Grupe B Lillyth Spong DO [facility] From 7PM - 7AM   03/10/2024, 3:36 AM

## 2024-03-11 DIAGNOSIS — G9341 Metabolic encephalopathy: Secondary | ICD-10-CM | POA: Diagnosis not present

## 2024-03-11 LAB — BASIC METABOLIC PANEL WITH GFR
Anion gap: 10 (ref 5–15)
BUN: 16 mg/dL (ref 8–23)
CO2: 25 mmol/L (ref 22–32)
Calcium: 9.1 mg/dL (ref 8.9–10.3)
Chloride: 104 mmol/L (ref 98–111)
Creatinine, Ser: 1.06 mg/dL — ABNORMAL HIGH (ref 0.44–1.00)
GFR, Estimated: 55 mL/min — ABNORMAL LOW (ref >60)
Glucose, Bld: 103 mg/dL — ABNORMAL HIGH (ref 70–99)
Potassium: 3.5 mmol/L (ref 3.5–5.1)
Sodium: 139 mmol/L (ref 135–145)

## 2024-03-11 LAB — URINE CULTURE: Culture: NO GROWTH

## 2024-03-11 LAB — CBC
HCT: 37.4 % (ref 36.0–46.0)
Hemoglobin: 12.2 g/dL (ref 12.0–15.0)
MCH: 29 pg (ref 26.0–34.0)
MCHC: 32.6 g/dL (ref 30.0–36.0)
MCV: 89 fL (ref 80.0–100.0)
Platelets: 188 K/uL (ref 150–400)
RBC: 4.2 MIL/uL (ref 3.87–5.11)
RDW: 12.7 % (ref 11.5–15.5)
WBC: 4.6 K/uL (ref 4.0–10.5)
nRBC: 0 % (ref 0.0–0.2)

## 2024-03-11 LAB — MAGNESIUM: Magnesium: 1.9 mg/dL (ref 1.7–2.4)

## 2024-03-11 LAB — PHOSPHORUS: Phosphorus: 3.7 mg/dL (ref 2.5–4.6)

## 2024-03-11 MED ORDER — PROCHLORPERAZINE EDISYLATE 10 MG/2ML IJ SOLN
5.0000 mg | Freq: Once | INTRAMUSCULAR | Status: AC
  Administered 2024-03-11: 5 mg via INTRAVENOUS
  Filled 2024-03-11: qty 2

## 2024-03-11 MED ORDER — HYDRALAZINE HCL 50 MG PO TABS
50.0000 mg | ORAL_TABLET | Freq: Three times a day (TID) | ORAL | Status: DC
  Administered 2024-03-11 – 2024-03-12 (×3): 50 mg via ORAL
  Filled 2024-03-11 (×3): qty 1

## 2024-03-11 MED ORDER — HYDRALAZINE HCL 25 MG PO TABS: 25.0000 mg | ORAL_TABLET | Freq: Three times a day (TID) | ORAL | Status: DC

## 2024-03-11 MED ORDER — HYDRALAZINE HCL 20 MG/ML IJ SOLN
10.0000 mg | Freq: Four times a day (QID) | INTRAMUSCULAR | Status: DC | PRN
  Administered 2024-03-11: 10 mg via INTRAVENOUS
  Filled 2024-03-11: qty 1

## 2024-03-11 MED ORDER — IPRATROPIUM-ALBUTEROL 0.5-2.5 (3) MG/3ML IN SOLN
3.0000 mL | Freq: Three times a day (TID) | RESPIRATORY_TRACT | Status: DC
  Administered 2024-03-11 – 2024-03-14 (×10): 3 mL via RESPIRATORY_TRACT
  Filled 2024-03-11 (×12): qty 3

## 2024-03-11 MED ORDER — DICYCLOMINE HCL 10 MG PO CAPS
10.0000 mg | ORAL_CAPSULE | Freq: Three times a day (TID) | ORAL | Status: DC | PRN
  Administered 2024-03-12 – 2024-03-14 (×3): 10 mg via ORAL
  Filled 2024-03-11 (×4): qty 1

## 2024-03-11 MED ORDER — FUROSEMIDE 10 MG/ML IJ SOLN
20.0000 mg | Freq: Once | INTRAMUSCULAR | Status: AC
  Administered 2024-03-11: 20 mg via INTRAVENOUS
  Filled 2024-03-11: qty 2

## 2024-03-11 MED ORDER — HYDRALAZINE HCL 20 MG/ML IJ SOLN
10.0000 mg | INTRAMUSCULAR | Status: DC | PRN
  Administered 2024-03-11: 10 mg via INTRAVENOUS
  Filled 2024-03-11: qty 1

## 2024-03-11 MED ORDER — HYDRALAZINE HCL 25 MG PO TABS
25.0000 mg | ORAL_TABLET | Freq: Two times a day (BID) | ORAL | Status: DC
  Administered 2024-03-11: 25 mg via ORAL
  Filled 2024-03-11: qty 1

## 2024-03-11 MED ORDER — K PHOS MONO-SOD PHOS DI & MONO 155-852-130 MG PO TABS
500.0000 mg | ORAL_TABLET | Freq: Two times a day (BID) | ORAL | Status: DC
  Administered 2024-03-11 (×2): 500 mg via ORAL
  Filled 2024-03-11 (×3): qty 2

## 2024-03-11 MED ORDER — POTASSIUM CHLORIDE CRYS ER 20 MEQ PO TBCR
40.0000 meq | EXTENDED_RELEASE_TABLET | Freq: Once | ORAL | Status: AC
  Administered 2024-03-11: 40 meq via ORAL
  Filled 2024-03-11: qty 2

## 2024-03-11 MED ORDER — HYDRALAZINE HCL 20 MG/ML IJ SOLN
5.0000 mg | Freq: Four times a day (QID) | INTRAMUSCULAR | Status: DC | PRN
  Administered 2024-03-11: 5 mg via INTRAVENOUS
  Filled 2024-03-11: qty 1

## 2024-03-11 NOTE — Progress Notes (Addendum)
 PROGRESS NOTE    Kari Coleman  FMW:[id number] DOB: 02-15-1951 DOA: 03/10/2024 PCP: Job Lukes, PA   Brief Narrative: 73 year old with past medical history significant for essential hypertension, hypothyroidism, hyperlipidemia who presented 12/8 with acute metabolic encephalopathy found to have influenza A+.  History was obtained by family member, over the last 1 or 2 days patient has excepted evidence of confusion, not oriented to year month, patient has also had cough and vomiting.  Evaluation in the ED:  patient was found to be febrile temperature 100, tachycardic heart rate 100 respiration rate 22 oxygen saturation 93-98 on room air.  Liver function test normal elevated lactic acid 2.2 white blood cell 8.1 hemoglobin 15 influenza AB PCR positive COVID-negative.  Chest x-ray show evidence of increased interstitial marking no pleural effusion.  Patient admitted for acute metabolic encephalopathy and influenza A+.   Assessment & Plan:   Principal Problem:   Acute metabolic encephalopathy Active Problems:   Acquired hypothyroidism   Influenza A   Lactic acidosis   History of essential hypertension   HLD (hyperlipidemia)   1-Acute Metabolic Encephalopathy: - Patient presented with altered mental status, confusion, somnolence, not oriented x 3. - Likely in the setting of febrile illness and influenza. - UA negative -12/08: Patient seen this morning and was unresponsive,  she had a temperature at that time of 102. ABG showed normal pH. -Improved today, alert answer questions.   Hyperammonemia:  -Normal LFT except for mild elevated Alkaline phosphatase.  -Started on Lactulose .  Repeat :LFT tomorrow, ammonia level.    2-Influenza A PNA Acute Hypoxic Resp failure.  Sepsis secondary to pneumonia, influenza.  Patient tachycardic temperature 102, respiration 20s, heart rate 111 - Patient presented with cough, fever confusion influenza A PCR positive. - Continue Tamiflu  - Blood  cultures: No growth to date.  - Continue with IV ceftriaxone  and azithromycin , cover for superimposed bacterial infection - schedule  albuterol -Ipratropium - She is requiring 4 L oxygen today. Will proceed with diuresis, lactic acid has normalized.   Bradycardia; asymptomatic. Replaced phosphorus. Check mag.  Hold metoprolol ./   Lactic acidosis; in the setting of infection, IV bolus ordered She does have some pulmonary congestion on chest x-ray, will be cautious with IV fluid Resolved with fluids.   Hypophosphatemia: Replete oral. Repeat labs today   Essential hypertension: - Continue with amlodipine  -Added  PRN hydralazine .  -Hold metoprolol  due to bradycardia.  Addendum;  BP have been elevated schedule oral hydralazine  TID.   Acquired hypothyroidism: -Continue home Synthroid   Hyperlipidemia: Continue pravastatin     Estimated body mass index is 34.22 kg/m as calculated from the following:   Height as of this encounter: 5' 7 (1.702 m).   Weight as of this encounter: 99.1 kg.   DVT prophylaxis: Lovenox  Code Status: Full code Family Communication: Daughter updated 12/08 Disposition Plan:  Status is: Inpatient Remains inpatient appropriate because: Management of influenza, sepsis    Consultants:  none  Procedures:  none  Antimicrobials:    Subjective: She is alert today, report been constipated at home.  She was oriented times 3.     Objective: Vitals:   03/11/24 0300 03/11/24 0404 03/11/24 0440 03/11/24 0537  BP: (!) 155/77 (!) 172/85  (!) 153/90  Pulse: (!) 45 (!) 52  (!) 53  Resp: (!) 21 (!) 24  (!) 22  Temp:   98.3 F (36.8 C)   TempSrc:   Oral   SpO2: 99% 97%  99%  Weight:      Height:  Intake/Output Summary (Last 24 hours) at 03/11/2024 0624 Last data filed at 03/10/2024 2000 Gross per 24 hour  Intake 343.3 ml  Output 1650 ml  Net -1306.7 ml   Filed Weights   03/10/24 0456 03/10/24 0930  Weight: 98.7 kg 99.1 kg     Examination:  General exam: Alert, follows command Respiratory system: Increase work of breathing, BL ronchus.  Cardiovascular system: S 1, S 2 RRR  Gastrointestinal system: BS present, soft, nt Central nervous system: Alert, follows some command.  Extremities: trace edema.    Data Reviewed: I have personally reviewed following labs and imaging studies  CBC: Recent Labs  Lab 03/10/24 0028 03/10/24 0513 03/11/24 0318  WBC 8.1 9.1 4.6  NEUTROABS 7.4 8.0*  --   HGB 15.0 13.9 12.2  HCT 45.2 42.9 37.4  MCV 88.8 91.9 89.0  PLT 248 214 188   Basic Metabolic Panel: Recent Labs  Lab 03/10/24 0028 03/10/24 0513 03/11/24 0318  NA 137 138 139  K 4.1 4.1 3.5  CL 101 101 104  CO2 24 25 25   GLUCOSE 173* 129* 103*  BUN 15 14 16   CREATININE 1.09* 1.02* 1.06*  CALCIUM 10.3 10.0 9.1  MG  --  1.9  --   PHOS  --  2.1*  --    GFR: Estimated Creatinine Clearance: 55.5 mL/min (A) (by C-G formula based on SCr of 1.06 mg/dL (H)). Liver Function Tests: Recent Labs  Lab 03/10/24 0028  AST 25  ALT 21  ALKPHOS 135*  BILITOT 0.7  PROT 8.7*  ALBUMIN 4.5   No results for input(s): LIPASE, AMYLASE in the last 168 hours. Recent Labs  Lab 03/10/24 1440  AMMONIA 65*   Coagulation Profile: Recent Labs  Lab 03/10/24 0838  INR 1.1   Cardiac Enzymes: Recent Labs  Lab 03/10/24 0838  CKTOTAL 209   BNP (last 3 results) Recent Labs    03/10/24 0838  PROBNP 1,311.0*   HbA1C: No results for input(s): HGBA1C in the last 72 hours. CBG: No results for input(s): GLUCAP in the last 168 hours. Lipid Profile: No results for input(s): CHOL, HDL, LDLCALC, TRIG, CHOLHDL, LDLDIRECT in the last 72 hours. Thyroid  Function Tests: Recent Labs    03/10/24 0838  TSH 0.378   Anemia Panel: Recent Labs    03/10/24 0838  VITAMINB12 619   Sepsis Labs: Recent Labs  Lab 03/10/24 0031 03/10/24 0304 03/10/24 0513 03/10/24 0838 03/10/24 1440  PROCALCITON  --    --   --  0.13  --   LATICACIDVEN 2.2* 2.2* 3.1*  --  1.8    Recent Results (from the past 240 hours)  Blood culture (routine x 2)     Status: None (Preliminary result)   Collection Time: 03/10/24 12:25 AM   Specimen: BLOOD  Result Value Ref Range Status   Specimen Description   Final    BLOOD BLOOD RIGHT HAND Performed at [facility], [address], [place], [place] [address]    Special Requests   Final    BOTTLES DRAWN AEROBIC AND ANAEROBIC Blood Culture results may not be optimal due to an inadequate volume of blood received in culture bottles Performed at [facility], [address] [place], [place] [address]    Culture   Final    NO GROWTH < 12 HOURS Performed at [facility], [address], [place], [place] [address]    Report Status PENDING  Incomplete  Resp panel by RT-PCR (RSV,  Flu A&B, Covid) Anterior Nasal Swab     Status: Abnormal   Collection Time: 03/10/24 12:27 AM   Specimen: Anterior Nasal Swab  Result Value Ref Range Status   SARS Coronavirus 2 by RT PCR NEGATIVE NEGATIVE Final    Comment: (NOTE) SARS-CoV-2 target nucleic acids are NOT DETECTED.  The SARS-CoV-2 RNA is generally detectable in upper respiratory specimens during the acute phase of infection. The lowest concentration of SARS-CoV-2 viral copies this assay can detect is 138 copies/mL. A negative result does not preclude SARS-Cov-2 infection and should not be used as the sole basis for treatment or other patient management decisions. A negative result may occur with  improper specimen collection/handling, submission of specimen other than nasopharyngeal swab, presence of viral mutation(s) within the areas targeted by this assay, and inadequate number of viral copies(<138 copies/mL). A negative result must be combined with clinical observations, patient history, and epidemiological information. The expected result is Negative.  Fact Sheet for  Patients:  bloggercourse.com  Fact Sheet for Healthcare Providers:  seriousbroker.it  This test is no t yet approved or cleared by the [place]  FDA and  has been authorized for detection and/or diagnosis of SARS-CoV-2 by FDA under an Emergency Use Authorization (EUA). This EUA will remain  in effect (meaning this test can be used) for the duration of the COVID-19 declaration under Section 564(b)(1) of the Act, 21 U.S.C.section 360bbb-3(b)(1), unless the authorization is terminated  or revoked sooner.       Influenza A by PCR POSITIVE (A) NEGATIVE Final   Influenza B by PCR NEGATIVE NEGATIVE Final    Comment: (NOTE) The Xpert Xpress SARS-CoV-2/FLU/RSV plus assay is intended as an aid in the diagnosis of influenza from Nasopharyngeal swab specimens and should not be used as a sole basis for treatment. Nasal washings and aspirates are unacceptable for Xpert Xpress SARS-CoV-2/FLU/RSV testing.  Fact Sheet for Patients: bloggercourse.com  Fact Sheet for Healthcare Providers: seriousbroker.it  This test is not yet approved or cleared by the [place]  FDA and has been authorized for detection and/or diagnosis of SARS-CoV-2 by FDA under an Emergency Use Authorization (EUA). This EUA will remain in effect (meaning this test can be used) for the duration of the COVID-19 declaration under Section 564(b)(1) of the Act, 21 U.S.C. section 360bbb-3(b)(1), unless the authorization is terminated or revoked.     Resp Syncytial Virus by PCR NEGATIVE NEGATIVE Final    Comment: (NOTE) Fact Sheet for Patients: bloggercourse.com  Fact Sheet for Healthcare Providers: seriousbroker.it  This test is not yet approved or cleared by the [place]  FDA and has been authorized for detection and/or diagnosis of SARS-CoV-2 by FDA under an  Emergency Use Authorization (EUA). This EUA will remain in effect (meaning this test can be used) for the duration of the COVID-19 declaration under Section 564(b)(1) of the Act, 21 U.S.C. section 360bbb-3(b)(1), unless the authorization is terminated or revoked.  Performed at [facility], [address] [place], [place] [address]   Blood culture (routine x 2)     Status: None (Preliminary result)   Collection Time: 03/10/24  1:55 AM   Specimen: BLOOD  Result Value Ref Range Status   Specimen Description   Final    BLOOD RIGHT ANTECUBITAL Performed at [facility], [address], [place], [place] [address]    Special Requests   Final    BOTTLES DRAWN AEROBIC AND ANAEROBIC Blood Culture results may not be optimal due to an inadequate  volume of blood received in culture bottles Performed at [facility], [address] [place], [place] [address]    Culture   Final    NO GROWTH < 12 HOURS Performed at [facility], [address], [place], [place] [address]    Report Status PENDING  Incomplete  MRSA Next Gen by PCR, Nasal     Status: None   Collection Time: 03/10/24  9:15 AM   Specimen: Nasal Mucosa; Nasal Swab  Result Value Ref Range Status   MRSA by PCR Next Gen NOT DETECTED NOT DETECTED Final    Comment: (NOTE) The GeneXpert MRSA Assay (FDA approved for NASAL specimens only), is one component of a comprehensive MRSA colonization surveillance program. It is not intended to diagnose MRSA infection nor to guide or monitor treatment for MRSA infections. Test performance is not FDA approved in patients less than 47 years old. Performed at [facility], [address], [place], [place] [address]          Radiology Studies: DG CHEST PORT 1 VIEW Result Date: 03/10/2024 EXAM: 1 VIEW(S) XRAY OF THE CHEST 03/10/2024 04:25:00 AM COMPARISON: 03/10/2024 CLINICAL HISTORY: Intractable nausea and  vomiting FINDINGS: LUNGS AND PLEURA: Low lung volumes. Pulmonary vascular congestion noted without overt airspace pulmonary edema. Asymmetric elevation in right hemidiaphragm. No focal pulmonary opacity. No pleural effusion. No pneumothorax. HEART AND MEDIASTINUM: Cardiomegaly. Calcified aorta. BONES AND SOFT TISSUES: No acute osseous abnormality. IMPRESSION: 1. Cardiomegaly with pulmonary vascular congestion, consistent with mild congestive heart failure physiology. 2. Asymmetric elevation of the right hemidiaphragm. 3. Calcified aorta, compatible with atherosclerosis. Electronically signed by: Camellia Candle MD 03/10/2024 04:32 AM EST RP Workstation: [id number]   DG Chest Portable 1 View Result Date: 03/10/2024 EXAM: 1 VIEW(S) XRAY OF THE CHEST 03/10/2024 12:31:00 AM COMPARISON: 07/24/2023 CLINICAL HISTORY: cough FINDINGS: LUNGS AND PLEURA: Low lung volume. Increased interstitial markings, nonspecific but may represent mild pulmonary edema. No pleural effusion. No pneumothorax. HEART AND MEDIASTINUM: Aortic arch calcifications. The cardiomediastinal silhouette is unchanged in the setting of patient positioning and AP portable technique. No acute abnormality of the cardiac and mediastinal silhouettes. BONES AND SOFT TISSUES: No acute osseous abnormality. IMPRESSION: 1. Increased interstitial markings, nonspecific but may represent mild pulmonary edema. 2. Low lung volume. Electronically signed by: Morgane Naveau MD 03/10/2024 12:40 AM EST RP Workstation: [id number]        Scheduled Meds:  amLODipine   10 mg Oral Daily   Chlorhexidine  Gluconate Cloth  6 each Topical Daily   enoxaparin  (LOVENOX ) injection  40 mg Subcutaneous Q24H   guaiFENesin   600 mg Oral BID   lactulose   20 g Oral BID   lactulose   300 mL Rectal Once   levothyroxine   88 mcg Oral Q0600   metoprolol  tartrate  25 mg Oral BID   oseltamivir   30 mg Oral BID   pravastatin   10 mg Oral Daily   Continuous Infusions:  azithromycin  Stopped  (03/10/24 1322)   cefTRIAXone  (ROCEPHIN )  IV Stopped (03/10/24 0925)     LOS: 1 day    Time spent: 35 Minutes    Lilyahna Sirmon A Jasiel Apachito, MD [facility]   If 7PM-7AM, please contact night-coverage [email or web address]  03/11/2024, 6:24 AM

## 2024-03-11 NOTE — Plan of Care (Signed)
  Problem: Education: Goal: Knowledge of General Education information will improve Description: Including pain rating scale, medication(s)/side effects and non-pharmacologic comfort measures Outcome: Progressing   Problem: Health Behavior/Discharge Planning: Goal: Ability to manage health-related needs will improve Outcome: Progressing   Problem: Clinical Measurements: Goal: Ability to maintain clinical measurements within normal limits will improve Outcome: Progressing Goal: Will remain free from infection Outcome: Progressing Goal: Diagnostic test results will improve Outcome: Progressing Goal: Respiratory complications will improve Outcome: Progressing Goal: Cardiovascular complication will be avoided Outcome: Progressing   Problem: Activity: Goal: Risk for activity intolerance will decrease Outcome: Progressing   Problem: Coping: Goal: Level of anxiety will decrease Outcome: Progressing   Problem: Elimination: Goal: Will not experience complications related to urinary retention Outcome: Progressing   Problem: Pain Managment: Goal: General experience of comfort will improve and/or be controlled Outcome: Progressing   Problem: Safety: Goal: Ability to remain free from injury will improve Outcome: Progressing   Problem: Skin Integrity: Goal: Risk for impaired skin integrity will decrease Outcome: Progressing   Problem: Nutrition: Goal: Adequate nutrition will be maintained Outcome: Not Progressing   Problem: Elimination: Goal: Will not experience complications related to bowel motility Outcome: Not Progressing

## 2024-03-12 DIAGNOSIS — G9341 Metabolic encephalopathy: Secondary | ICD-10-CM | POA: Diagnosis not present

## 2024-03-12 LAB — COMPREHENSIVE METABOLIC PANEL WITH GFR
ALT: 21 U/L (ref 0–44)
AST: 30 U/L (ref 15–41)
Albumin: 3.6 g/dL (ref 3.5–5.0)
Alkaline Phosphatase: 91 U/L (ref 38–126)
Anion gap: 14 (ref 5–15)
BUN: 24 mg/dL — ABNORMAL HIGH (ref 8–23)
CO2: 24 mmol/L (ref 22–32)
Calcium: 9 mg/dL (ref 8.9–10.3)
Chloride: 102 mmol/L (ref 98–111)
Creatinine, Ser: 1.47 mg/dL — ABNORMAL HIGH (ref 0.44–1.00)
GFR, Estimated: 37 mL/min — ABNORMAL LOW (ref >60)
Glucose, Bld: 125 mg/dL — ABNORMAL HIGH (ref 70–99)
Potassium: 3.6 mmol/L (ref 3.5–5.1)
Sodium: 140 mmol/L (ref 135–145)
Total Bilirubin: 0.5 mg/dL (ref 0.0–1.2)
Total Protein: 7 g/dL (ref 6.5–8.1)

## 2024-03-12 LAB — CBC
HCT: 40.8 % (ref 36.0–46.0)
Hemoglobin: 13.6 g/dL (ref 12.0–15.0)
MCH: 29.2 pg (ref 26.0–34.0)
MCHC: 33.3 g/dL (ref 30.0–36.0)
MCV: 87.7 fL (ref 80.0–100.0)
Platelets: 208 K/uL (ref 150–400)
RBC: 4.65 MIL/uL (ref 3.87–5.11)
RDW: 13 % (ref 11.5–15.5)
WBC: 5.2 K/uL (ref 4.0–10.5)
nRBC: 0 % (ref 0.0–0.2)

## 2024-03-12 LAB — AMMONIA: Ammonia: 18 umol/L (ref 9–35)

## 2024-03-12 LAB — PHOSPHORUS: Phosphorus: 5.6 mg/dL — ABNORMAL HIGH (ref 2.5–4.6)

## 2024-03-12 MED ORDER — HYDRALAZINE HCL 25 MG PO TABS
25.0000 mg | ORAL_TABLET | Freq: Three times a day (TID) | ORAL | Status: DC
  Administered 2024-03-12 – 2024-03-15 (×9): 25 mg via ORAL
  Filled 2024-03-12 (×9): qty 1

## 2024-03-12 MED ORDER — LATANOPROST 0.005 % OP SOLN
1.0000 [drp] | Freq: Every day | OPHTHALMIC | Status: DC
  Administered 2024-03-12 – 2024-03-14 (×3): 1 [drp] via OPHTHALMIC
  Filled 2024-03-12: qty 2.5

## 2024-03-12 MED ORDER — SODIUM CHLORIDE 0.9 % IV SOLN: INTRAVENOUS | Status: AC | PRN

## 2024-03-12 MED ORDER — METOPROLOL TARTRATE 25 MG PO TABS
25.0000 mg | ORAL_TABLET | Freq: Two times a day (BID) | ORAL | Status: DC
  Administered 2024-03-12 – 2024-03-15 (×7): 25 mg via ORAL
  Filled 2024-03-12 (×7): qty 1

## 2024-03-12 MED ORDER — BRIMONIDINE TARTRATE 0.2 % OP SOLN
1.0000 [drp] | Freq: Every day | OPHTHALMIC | Status: DC
  Administered 2024-03-12 – 2024-03-14 (×3): 1 [drp] via OPHTHALMIC
  Filled 2024-03-12: qty 5

## 2024-03-12 NOTE — TOC Initial Note (Signed)
 Transition of Care [facility]) - Initial/Assessment Note    Patient Details  Name: Kari Coleman MRN: [id number] Date of Birth: 05-18-1950  [facility] CM/SW Contact:    Jon ONEIDA Anon, RN Phone Number: 03/12/2024, 11:05 AM  Clinical Narrative:                 Pt is from home. Pt brought in via ambulance due to altered mental status and a developing cough. Pt needing continued medical workup, not ready for discharge. PT/OT consulted, awaiting any new recommendations. ICM will follow for any DC planning needs.     Expected Discharge Plan:  (TBD) Barriers to Discharge: Continued Medical Work up   Patient Goals and CMS Choice Patient states their goals for this hospitalization and ongoing recovery are:: Home CMS Medicare.gov Compare Post Acute Care list provided to:: Patient Represenative (must comment) (Pt daughter) Choice offered to / list presented to : Adult Children [place] ownership interest in [facility].provided to:: Adult Children    Expected Discharge Plan and Services In-house Referral: NA Discharge Planning Services: CM Consult Post Acute Care Choice: Durable Medical Equipment Living arrangements for the past 2 months: Apartment                 DME Arranged: N/A DME Agency: NA       HH Arranged: NA HH Agency: NA        Prior Living Arrangements/Services Living arrangements for the past 2 months: Apartment Lives with:: Self, Relatives Patient language and need for interpreter reviewed:: Yes Do you feel safe going back to the place where you live?: Yes      Need for Family Participation in Patient Care: Yes (Comment) Care giver support system in place?: Yes (comment) Current home services: DME Criminal Activity/Legal Involvement Pertinent to Current Situation/Hospitalization: No - Comment as needed  Activities of Daily Living      Permission Sought/Granted Permission sought to share information with : Family Supports    Share  Information with NAME: Shlomo Salines (Daughter)  [phone]           Emotional Assessment Appearance:: Appears stated age Attitude/Demeanor/Rapport: Unable to Assess Affect (typically observed): Unable to Assess Orientation: : Oriented to Self Alcohol / Substance Use: Not Applicable Psych Involvement: No (comment)  Admission diagnosis:  Influenza A [J10.1] Elevated random blood glucose level [R73.9] Elevated alkaline phosphatase level [R74.8] Sepsis due to undetermined organism (HCC) [A41.9] Acute metabolic encephalopathy [G93.41] Patient Active Problem List   Diagnosis Date Noted   Acute metabolic encephalopathy 03/10/2024   Influenza A 03/10/2024   Lactic acidosis 03/10/2024   History of essential hypertension 03/10/2024   HLD (hyperlipidemia) 03/10/2024   COVID-19 07/24/2023   UTI (urinary tract infection) 12/19/2022   Acute encephalopathy 12/19/2022   Herpes zoster without complication 08/31/2022   Chronic deep vein thrombosis (DVT) of left popliteal vein (HCC) 12/28/2021   Acute pulmonary embolism (HCC) 12/26/2021   Urinary incontinence 07/18/2021   Tuberculosis 07/18/2021   Tobacco abuse 07/18/2021   Retinal detachment 07/18/2021   PONV (postoperative nausea and vomiting) 07/18/2021   Palpitations 07/18/2021   Hyperthyroidism 07/18/2021   History of echocardiogram 07/18/2021   GERD (gastroesophageal reflux disease) 07/18/2021   Cervical cancer (HCC) 07/18/2021   Cataract 07/18/2021   Arthritis 07/18/2021   Allergy 07/18/2021   Chronic angle-closure glaucoma of both eyes, severe stage 03/02/2021   Vitamin D  deficiency 01/22/2020   Multinodular goiter 01/22/2020   Fatigue 10/21/2019   Primary osteoarthritis of right  knee 04/22/2019   Abnormal EKG 04/14/2019   Acquired hypothyroidism 04/11/2019   Elevated glucose 04/08/2019   Chest discomfort 04/25/2018   Ex-smoker 04/25/2018   Primary localized osteoarthritis of right knee 01/04/2016   Glaucoma 2017    Essential hypertension    PCP:  Job Lukes, PA Pharmacy:   [facility] [address] [place] - [address] [place] [address] Phone: [phone] Fax: [phone]  [facility] [address] [place] [address] Phone: [phone] Fax: [phone]  [facility] [place], [place] - [address] [place] [address] Phone: [phone] Fax: [phone]     Social Drivers of Health (SDOH) Social History: SDOH Screenings   Food Insecurity: Patient Unable To Answer (03/10/2024)  Housing: Unknown (03/10/2024)  Transportation Needs: Patient Unable To Answer (03/10/2024)  Utilities: Patient Unable To Answer (03/10/2024)  Alcohol Screen: Low Risk  (12/24/2023)  Depression (PHQ2-9): Low Risk  (12/24/2023)  Financial Resource Strain: Low Risk  (12/24/2023)  Physical Activity: Insufficiently Active (12/24/2023)  Social Connections: Unknown (03/10/2024)  Recent Concern: Social Connections - Moderately Isolated (12/24/2023)  Stress: No Stress Concern Present (12/24/2023)  Tobacco Use: Medium Risk (03/10/2024)  Health Literacy: Adequate Health Literacy (12/24/2023)   SDOH Interventions:     Readmission Risk Interventions    03/12/2024   11:01 AM 07/27/2023   10:19 AM 12/21/2022    1:40 PM  Readmission Risk Prevention Plan  Transportation Screening Complete Complete Complete  PCP or Specialist Appt within 5-7 Days Complete Complete Complete  Home Care Screening Complete Complete Complete  Medication Review (RN CM) Complete Complete Complete

## 2024-03-12 NOTE — Progress Notes (Addendum)
 PROGRESS NOTE    Kari Coleman  FMW:[id number] DOB: [birth year] DOA: 03/10/2024 PCP: Job Lukes, PA   Brief Narrative: 73 year old with past medical history significant for essential hypertension, hypothyroidism, hyperlipidemia who presented 12/8 with acute metabolic encephalopathy found to have influenza A+.  History was obtained by family member, over the last 1 or 2 days patient had evidence of confusion, not oriented to year month, patient has also having cough and vomiting.  Evaluation in the ED:  Patient was found to be febrile temperature 100, tachycardic heart rate 100, respiration rate 22, oxygen saturation 93-98 on room air.  Liver function test normal elevated lactic acid 2.2 white blood cell 8.1, hemoglobin 15, influenza A PCR positive COVID-negative.  Chest x-ray show evidence of increased interstitial marking no pleural effusion.    Patient admitted for acute metabolic encephalopathy and influenza A+.   Assessment & Plan:   Principal Problem:   Acute metabolic encephalopathy Active Problems:   Acquired hypothyroidism   Influenza A   Lactic acidosis   History of essential hypertension   HLD (hyperlipidemia)   1-Acute Metabolic Encephalopathy: in setting fever and infection.  - Patient presented with altered mental status, confusion, somnolence, not oriented x 3. - Likely in the setting of febrile illness and influenza. - UA negative -12/08: Patient was unresponsive,  protecting airway, she had a temperature at that time of 102. -ABG showed normal pH. -Improving, alert , oriented times 3.  Out of bed, PT, OT consult.   Hyperammonemia:  -Normal LFT except for mild elevated Alkaline phosphatase.  -Started on Lactulose  but subsequently discontinue due to diarrhea.  Repeat :LFT normal, ammonia normalized.    2-Influenza A PNA Acute Hypoxic Resp failure.  Sepsis secondary to pneumonia, influenza.  Patient tachycardic temperature 102, respiration 20s, heart rate  111 - Patient presented with cough, fever confusion influenza A PCR positive. - Continue Tamiflu  - Blood cultures: No growth to date.  - Continue with IV ceftriaxone  and azithromycin , cover for superimposed bacterial infection, will treat for 5 days.  - Schedule  albuterol -Ipratropium - She is requiring 4 L oxygen on 12/09--received one time dose lasix . Oxygen requirement down to 2 L.   Bradycardia; asymptomatic. Replaced phosphorus. Resolve, now tachycardic, resume metoprolol ./   Lactic acidosis; in the setting of infection, IV bolus ordered She does have some pulmonary congestion on chest x-ray, will be cautious with IV fluid Resolved with fluids.   Hypophosphatemia: Replaced.   Essential hypertension: - Continue with amlodipine  -Added  PRN hydralazine .  -resume metoprolol .  -she was started on low dose oral hydralazine .   Acquired hypothyroidism: -Continue home Synthroid   Hyperlipidemia: Continue pravastatin    AKI; suspect related to  hypovolemia in setting and diarrhea and lasix  Hold lasix .   Diarrhea; in setting lactulose , could also be related to influenza. Lactulose  discontinue.   Estimated body mass index is 34.18 kg/m as calculated from the following:   Height as of this encounter: 5' 7 (1.702 m).   Weight as of this encounter: 99 kg.   DVT prophylaxis: Lovenox  Code Status: Full code Family Communication: Daughter updated 12/10 Disposition Plan:  Status is: Inpatient Remains inpatient appropriate because: Management of influenza, sepsis    Consultants:  none  Procedures:  none  Antimicrobials:    Subjective: She is alert, answer questions. Still having some back pain.  Has rectal tube small amount watery stool.  Denies abdominal pain. She would like to try regular food.      Objective: Vitals:   03/12/24  0407 03/12/24 0500 03/12/24 0530 03/12/24 0600  BP:  (!) 162/82 (!) 162/82 134/69  Pulse:  96  84  Resp:  (!) 21  19  Temp: 98.7 F  (37.1 C)     TempSrc: Oral     SpO2:  94%  98%  Weight:  99 kg    Height:        Intake/Output Summary (Last 24 hours) at 03/12/2024 0716 Last data filed at 03/12/2024 0644 Gross per 24 hour  Intake 577.2 ml  Output 780 ml  Net -202.8 ml   Filed Weights   03/10/24 0930 03/11/24 0500 03/12/24 0500  Weight: 99.1 kg 99.8 kg 99 kg    Examination:  General exam: Alert Respiratory system: Normal resp effort, BL air movement no ronchus.  Cardiovascular system: S 1, S 2 RRR tachycardic Gastrointestinal system: BS present, soft, nt Central nervous system: Alert, follows command Extremities: no edema   Data Reviewed: I have personally reviewed following labs and imaging studies  CBC: Recent Labs  Lab 03/10/24 0028 03/10/24 0513 03/11/24 0318 03/12/24 0328  WBC 8.1 9.1 4.6 5.2  NEUTROABS 7.4 8.0*  --   --   HGB 15.0 13.9 12.2 13.6  HCT 45.2 42.9 37.4 40.8  MCV 88.8 91.9 89.0 87.7  PLT 248 214 188 208   Basic Metabolic Panel: Recent Labs  Lab 03/10/24 0028 03/10/24 0513 03/11/24 0318 03/12/24 0328  NA 137 138 139 140  K 4.1 4.1 3.5 3.6  CL 101 101 104 102  CO2 24 25 25 24   GLUCOSE 173* 129* 103* 125*  BUN 15 14 16  24*  CREATININE 1.09* 1.02* 1.06* 1.47*  CALCIUM 10.3 10.0 9.1 9.0  MG  --  1.9 1.9  --   PHOS  --  2.1* 3.7 5.6*   GFR: Estimated Creatinine Clearance: 40 mL/min (A) (by C-G formula based on SCr of 1.47 mg/dL (H)). Liver Function Tests: Recent Labs  Lab 03/10/24 0028 03/12/24 0328  AST 25 30  ALT 21 21  ALKPHOS 135* 91  BILITOT 0.7 0.5  PROT 8.7* 7.0  ALBUMIN 4.5 3.6   No results for input(s): LIPASE, AMYLASE in the last 168 hours. Recent Labs  Lab 03/10/24 1440 03/12/24 0328  AMMONIA 65* 18   Coagulation Profile: Recent Labs  Lab 03/10/24 0838  INR 1.1   Cardiac Enzymes: Recent Labs  Lab 03/10/24 0838  CKTOTAL 209   BNP (last 3 results) Recent Labs    03/10/24 0838  PROBNP 1,311.0*   HbA1C: No results for  input(s): HGBA1C in the last 72 hours. CBG: No results for input(s): GLUCAP in the last 168 hours. Lipid Profile: No results for input(s): CHOL, HDL, LDLCALC, TRIG, CHOLHDL, LDLDIRECT in the last 72 hours. Thyroid  Function Tests: Recent Labs    03/10/24 0838  TSH 0.378   Anemia Panel: Recent Labs    03/10/24 0838  VITAMINB12 619   Sepsis Labs: Recent Labs  Lab 03/10/24 0031 03/10/24 0304 03/10/24 0513 03/10/24 0838 03/10/24 1440  PROCALCITON  --   --   --  0.13  --   LATICACIDVEN 2.2* 2.2* 3.1*  --  1.8    Recent Results (from the past 240 hours)  Blood culture (routine x 2)     Status: None (Preliminary result)   Collection Time: 03/10/24 12:25 AM   Specimen: BLOOD RIGHT HAND  Result Value Ref Range Status   Specimen Description   Final    BLOOD RIGHT HAND Performed at [facility]  [facility]ospital Lab, [address] [place], [place] [address]    Special Requests   Final    BOTTLES DRAWN AEROBIC AND ANAEROBIC Blood Culture results may not be optimal due to an inadequate volume of blood received in culture bottles Performed at [facility], [address], [place], [place] [address]    Culture   Final    NO GROWTH 1 DAY Performed at [facility], [address] [place], [place] [address]    Report Status PENDING  Incomplete  Resp panel by RT-PCR (RSV, Flu A&B, Covid) Anterior Nasal Swab     Status: Abnormal   Collection Time: 03/10/24 12:27 AM   Specimen: Anterior Nasal Swab  Result Value Ref Range Status   SARS Coronavirus 2 by RT PCR NEGATIVE NEGATIVE Final    Comment: (NOTE) SARS-CoV-2 target nucleic acids are NOT DETECTED.  The SARS-CoV-2 RNA is generally detectable in upper respiratory specimens during the acute phase of infection. The lowest concentration of SARS-CoV-2 viral copies this assay can detect is 138 copies/mL. A negative result does not preclude SARS-Cov-2 infection and should not be used as the sole basis for  treatment or other patient management decisions. A negative result may occur with  improper specimen collection/handling, submission of specimen other than nasopharyngeal swab, presence of viral mutation(s) within the areas targeted by this assay, and inadequate number of viral copies(<138 copies/mL). A negative result must be combined with clinical observations, patient history, and epidemiological information. The expected result is Negative.  Fact Sheet for Patients:  bloggercourse.com  Fact Sheet for Healthcare Providers:  seriousbroker.it  This test is no t yet approved or cleared by the [place]  FDA and  has been authorized for detection and/or diagnosis of SARS-CoV-2 by FDA under an Emergency Use Authorization (EUA). This EUA will remain  in effect (meaning this test can be used) for the duration of the COVID-19 declaration under Section 564(b)(1) of the Act, 21 U.S.C.section 360bbb-3(b)(1), unless the authorization is terminated  or revoked sooner.       Influenza A by PCR POSITIVE (A) NEGATIVE Final   Influenza B by PCR NEGATIVE NEGATIVE Final    Comment: (NOTE) The Xpert Xpress SARS-CoV-2/FLU/RSV plus assay is intended as an aid in the diagnosis of influenza from Nasopharyngeal swab specimens and should not be used as a sole basis for treatment. Nasal washings and aspirates are unacceptable for Xpert Xpress SARS-CoV-2/FLU/RSV testing.  Fact Sheet for Patients: bloggercourse.com  Fact Sheet for Healthcare Providers: seriousbroker.it  This test is not yet approved or cleared by the [place]  FDA and has been authorized for detection and/or diagnosis of SARS-CoV-2 by FDA under an Emergency Use Authorization (EUA). This EUA will remain in effect (meaning this test can be used) for the duration of the COVID-19 declaration under Section 564(b)(1) of the Act, 21  U.S.C. section 360bbb-3(b)(1), unless the authorization is terminated or revoked.     Resp Syncytial Virus by PCR NEGATIVE NEGATIVE Final    Comment: (NOTE) Fact Sheet for Patients: bloggercourse.com  Fact Sheet for Healthcare Providers: seriousbroker.it  This test is not yet approved or cleared by the [place]  FDA and has been authorized for detection and/or diagnosis of SARS-CoV-2 by FDA under an Emergency Use Authorization (EUA). This EUA will remain in effect (meaning this test can be used) for the duration of the COVID-19 declaration under Section 564(b)(1) of the Act, 21 U.S.C. section 360bbb-3(b)(1), unless the authorization is terminated or revoked.  Performed at  [facility], [address], [place], [place] [address]   Urine Culture     Status: None   Collection Time: 03/10/24 12:47 AM   Specimen: Urine, Catheterized  Result Value Ref Range Status   Specimen Description   Final    URINE, CATHETERIZED Performed at [facility], [address] [place], [place] [address]    Special Requests   Final    NONE Performed at [facility], [address], [place], [place] [address]    Culture   Final    NO GROWTH Performed at [facility], [address], [place], [place] [address]    Report Status 03/11/2024 FINAL  Final  Blood culture (routine x 2)     Status: None (Preliminary result)   Collection Time: 03/10/24  1:55 AM   Specimen: BLOOD  Result Value Ref Range Status   Specimen Description   Final    BLOOD RIGHT ANTECUBITAL Performed at [facility], [address], [place], [place] [address]    Special Requests   Final    BOTTLES DRAWN AEROBIC AND ANAEROBIC Blood Culture results may not be optimal due to an inadequate volume of blood received in culture bottles Performed at [facility], [address] [place], [place] [address]    Culture   Final    NO GROWTH 1 DAY Performed at [facility], [address] [place], [place] [address]    Report Status PENDING  Incomplete  MRSA Next Gen by PCR, Nasal     Status: None   Collection Time: 03/10/24  9:15 AM   Specimen: Nasal Mucosa; Nasal Swab  Result Value Ref Range Status   MRSA by PCR Next Gen NOT DETECTED NOT DETECTED Final    Comment: (NOTE) The GeneXpert MRSA Assay (FDA approved for NASAL specimens only), is one component of a comprehensive MRSA colonization surveillance program. It is not intended to diagnose MRSA infection nor to guide or monitor treatment for MRSA infections. Test performance is not FDA approved in patients less than 75 years old. Performed at [facility], [address], [place], [place] [address]          Radiology Studies: No results found.       Scheduled Meds:  amLODipine   10 mg Oral Daily   Chlorhexidine  Gluconate Cloth  6 each Topical Daily   enoxaparin  (LOVENOX ) injection  40 mg Subcutaneous Q24H   guaiFENesin   600 mg Oral BID   hydrALAZINE   25 mg Oral Q8H   ipratropium-albuterol   3 mL Nebulization TID   levothyroxine   88 mcg Oral Q0600   metoprolol  tartrate  25 mg Oral BID   oseltamivir   30 mg Oral BID   phosphorus  500 mg Oral BID   pravastatin   10 mg Oral Daily   Continuous Infusions:  azithromycin  Stopped (03/11/24 0950)   cefTRIAXone  (ROCEPHIN )  IV Stopped (03/11/24 0847)     LOS: 2 days    Time spent: 35 Minutes    Starsha Morning A Anette Barra, MD [facility]   If 7PM-7AM, please contact night-coverage [email or web address]  03/12/2024, 7:16 AM

## 2024-03-12 NOTE — Progress Notes (Signed)
 Report called. Patient stable at time of transfer.

## 2024-03-12 NOTE — Plan of Care (Signed)
   Problem: Clinical Measurements: Goal: Diagnostic test results will improve Outcome: Progressing

## 2024-03-13 ENCOUNTER — Other Ambulatory Visit (HOSPITAL_COMMUNITY): Payer: Self-pay

## 2024-03-13 LAB — BASIC METABOLIC PANEL WITH GFR
Anion gap: 11 (ref 5–15)
BUN: 37 mg/dL — ABNORMAL HIGH (ref 8–23)
CO2: 26 mmol/L (ref 22–32)
Calcium: 9.3 mg/dL (ref 8.9–10.3)
Chloride: 103 mmol/L (ref 98–111)
Creatinine, Ser: 2.22 mg/dL — ABNORMAL HIGH (ref 0.44–1.00)
GFR, Estimated: 22 mL/min — ABNORMAL LOW (ref >60)
Glucose, Bld: 118 mg/dL — ABNORMAL HIGH (ref 70–99)
Potassium: 3.7 mmol/L (ref 3.5–5.1)
Sodium: 140 mmol/L (ref 135–145)

## 2024-03-13 LAB — CBC
HCT: 39.1 % (ref 36.0–46.0)
Hemoglobin: 13 g/dL (ref 12.0–15.0)
MCH: 29.6 pg (ref 26.0–34.0)
MCHC: 33.2 g/dL (ref 30.0–36.0)
MCV: 89.1 fL (ref 80.0–100.0)
Platelets: 216 K/uL (ref 150–400)
RBC: 4.39 MIL/uL (ref 3.87–5.11)
RDW: 13.1 % (ref 11.5–15.5)
WBC: 6.3 K/uL (ref 4.0–10.5)
nRBC: 0 % (ref 0.0–0.2)

## 2024-03-13 MED ORDER — OSELTAMIVIR PHOSPHATE 30 MG PO CAPS
30.0000 mg | ORAL_CAPSULE | Freq: Every day | ORAL | Status: AC
  Administered 2024-03-13 – 2024-03-14 (×2): 30 mg via ORAL
  Filled 2024-03-13 (×2): qty 1

## 2024-03-13 MED ORDER — SODIUM CHLORIDE 0.9 % IV SOLN: INTRAVENOUS | Status: DC

## 2024-03-13 MED ORDER — ENOXAPARIN SODIUM 30 MG/0.3ML IJ SOSY
30.0000 mg | PREFILLED_SYRINGE | INTRAMUSCULAR | Status: DC
  Administered 2024-03-13: 30 mg via SUBCUTANEOUS
  Filled 2024-03-13: qty 0.3

## 2024-03-13 NOTE — Evaluation (Signed)
 Physical Therapy Evaluation Patient Details Name: Kari Coleman MRN: [id number] DOB: 1950/07/16 Today's Date: 03/13/2024  History of Present Illness  Kari Coleman is a 73 y.o. female with medical history significant for essential pretension, acquired hypothyroidism, hyperlipidemia, who is admitted to [facility] on 03/10/2024 with suspected acute metabolic encephalopathy in the setting of influenza A infection after presenting from home to [facility] ED complaining of altered mental status.  Clinical Impression  Pt admitted with above diagnosis. Pt in bed, states that she needs to use the bathroom, able to come to sit edge of bed with use of bed rails and HOB elevated. Maintains balance in sitting, able to stand with assist and step pivot to the [facility]. Pt voids bladder, stands and able to self cleanse. Progresses amb x72ft CGA on 2L O2 via [place] maintaining 98% O2. Pt returns to the recliner and attending present at end of session. Pt currently with functional limitations due to the deficits listed below (see PT Problem List). Pt will benefit from acute skilled PT to increase their independence and safety with mobility to allow discharge.           If plan is discharge home, recommend the following: A little help with walking and/or transfers;A little help with bathing/dressing/bathroom;Assistance with cooking/housework;Assist for transportation   Can travel by private vehicle        Equipment Recommendations None recommended by PT  Recommendations for Other Services       Functional Status Assessment Patient has had a recent decline in their functional status and demonstrates the ability to make significant improvements in function in a reasonable and predictable amount of time.     Precautions / Restrictions Precautions Precautions: Fall Recall of Precautions/Restrictions: Intact Restrictions Weight Bearing Restrictions Per Provider Order: No      Mobility  Bed Mobility Overal bed  mobility: Modified Independent             General bed mobility comments: inc time and use of bed rails    Transfers Overall transfer level: Needs assistance Equipment used: Rolling walker (2 wheels) Transfers: Sit to/from Stand, Bed to chair/wheelchair/BSC Sit to Stand: Min assist   Step pivot transfers: Contact guard assist       General transfer comment: inc time, small steps, once upright in the walker, pt able to maintain balance    Ambulation/Gait Ambulation/Gait assistance: Contact guard assist Gait Distance (Feet): 60 Feet Assistive device: Rolling walker (2 wheels) Gait Pattern/deviations: Step-to pattern, Decreased step length - right, Decreased step length - left, Trunk flexed Gait velocity: dec     General Gait Details: Pt amb with small, reciprocal steps, good positioning within walker. able to complete directional changes with similar cadence as amb  Stairs            Wheelchair Mobility     Tilt Bed    Modified Rankin (Stroke Patients Only)       Balance Overall balance assessment: Needs assistance Sitting-balance support: Feet supported, No upper extremity supported Sitting balance-Leahy Scale: Good     Standing balance support: No upper extremity supported, During functional activity Standing balance-Leahy Scale: Fair Standing balance comment: Pt able to wipe herself after using BSC                             Pertinent Vitals/Pain Pain Assessment Pain Assessment: No/denies pain    Home Living Family/patient expects to be discharged to:: Private residence  Living Arrangements: Children Available Help at Discharge: Family Type of Home: Apartment Home Access: Level entry       Home Layout: One level Home Equipment: Agricultural Consultant (2 wheels);Cane - single Acupuncturist (4 wheels)      Prior Function Prior Level of Function : History of Falls (last six months)             Mobility  Comments: uses RW vs cane, sup A ADLs Comments: family assists as needed, pt has been grooming and dressing, bathing ind PTA     Extremity/Trunk Assessment   Upper Extremity Assessment Upper Extremity Assessment: Defer to OT evaluation    Lower Extremity Assessment Lower Extremity Assessment: Generalized weakness       Communication   Communication Communication: No apparent difficulties    Cognition Arousal: Alert Behavior During Therapy: WFL for tasks assessed/performed   PT - Cognitive impairments: No apparent impairments                         Following commands: Intact       Cueing       General Comments      Exercises     Assessment/Plan    PT Assessment Patient needs continued PT services  PT Problem List Decreased strength;Decreased activity tolerance;Decreased balance;Decreased mobility       PT Treatment Interventions DME instruction;Therapeutic exercise;Gait training;Balance training;Neuromuscular re-education;Functional mobility training;Therapeutic activities;Patient/family education    PT Goals (Current goals can be found in the Care Plan section)  Acute Rehab PT Goals Patient Stated Goal: return home, get out of bed PT Goal Formulation: With patient Time For Goal Achievement: 03/27/24 Potential to Achieve Goals: Good    Frequency Min 2X/week     Co-evaluation               AM-PAC PT 6 Clicks Mobility  Outcome Measure Help needed turning from your back to your side while in a flat bed without using bedrails?: A Little Help needed moving from lying on your back to sitting on the side of a flat bed without using bedrails?: A Little Help needed moving to and from a bed to a chair (including a wheelchair)?: A Little Help needed standing up from a chair using your arms (e.g., wheelchair or bedside chair)?: A Little Help needed to walk in hospital room?: A Little Help needed climbing 3-5 steps with a railing? : A Lot 6  Click Score: 17    End of Session Equipment Utilized During Treatment: Gait belt Activity Tolerance: Patient tolerated treatment well Patient left: in chair;with nursing/sitter in room;with call bell/phone within reach;with chair alarm set Nurse Communication: Mobility status PT Visit Diagnosis: Difficulty in walking, not elsewhere classified (R26.2);Muscle weakness (generalized) (M62.81);History of falling (Z91.81)    Time: 9065-8986 PT Time Calculation (min) (ACUTE ONLY): 39 min   Charges:   PT Evaluation $PT Eval Low Complexity: 1 Low PT Treatments $Gait Training: 8-22 mins $Therapeutic Activity: 8-22 mins PT General Charges $$ ACUTE PT VISIT: 1 Visit         Stann, PT Acute Rehabilitation Services Office: [phone] 03/13/2024   Stann DELENA Ohara 03/13/2024, 10:28 AM

## 2024-03-13 NOTE — Plan of Care (Signed)
  Problem: Clinical Measurements: Goal: Diagnostic test results will improve Outcome: Not Progressing   Problem: Activity: Goal: Risk for activity intolerance will decrease Outcome: Not Progressing   

## 2024-03-13 NOTE — TOC Progression Note (Addendum)
 Transition of Care [facility]) - Progression Note    Patient Details  Name: Kari Coleman MRN: [id number] Date of Birth: 04-23-1950  Transition of [facility]) CM/SW Contact  Sonda Manuella Quill, RN Phone Number: 03/13/2024, 2:51 PM  Clinical Narrative:    Recc received for HHPT; LVM for pt's dtr Kari Coleman [phone]); awaiting return call to discuss; OT eval pending; ; Dr Austria notified via secure chat; awaiting orders.  -[id number]- return call from pt's dtr Kari Coleman; she agreed to recc HHPT; [name] said she does not have an agency of preference; faxed out via hub to: [facility], [facility], [facility], [facility], [facility], [facility], and [facility]   Expected Discharge Plan:  (TBD) Barriers to Discharge: Continued Medical Work up               Expected Discharge Plan and Services In-house Referral: NA Discharge Planning Services: CM Consult Post Acute Care Choice: Durable Medical Equipment Living arrangements for the past 2 months: Apartment                 DME Arranged: N/A DME Agency: NA       HH Arranged: NA HH Agency: NA         Social Drivers of Health (SDOH) Interventions SDOH Screenings   Food Insecurity: Patient Unable To Answer (03/10/2024)  Housing: Unknown (03/10/2024)  Transportation Needs: Patient Unable To Answer (03/10/2024)  Utilities: Patient Unable To Answer (03/10/2024)  Alcohol Screen: Low Risk (12/24/2023)  Depression (PHQ2-9): Low Risk (12/24/2023)  Financial Resource Strain: Low Risk (12/24/2023)  Physical Activity: Insufficiently Active (12/24/2023)  Social Connections: Unknown (03/10/2024)  Recent Concern: Social Connections - Moderately Isolated (12/24/2023)  Stress: No Stress Concern Present (12/24/2023)  Tobacco Use: Medium Risk (03/10/2024)  Health Literacy: Adequate Health Literacy (12/24/2023)    Readmission Risk Interventions    03/12/2024   11:01 AM 07/27/2023   10:19 AM 12/21/2022    1:40 PM  Readmission Risk Prevention Plan   Transportation Screening Complete Complete Complete  PCP or Specialist Appt within 5-7 Days Complete Complete Complete  Home Care Screening Complete Complete Complete  Medication Review (RN CM) Complete Complete Complete

## 2024-03-13 NOTE — Progress Notes (Signed)
 PROGRESS NOTE    Kari Coleman  FMW:[id number] DOB: 14-Sep-1950 DOA: 03/10/2024 PCP: Job Lukes, PA    Brief Narrative:   Kari Coleman is a 73 y.o. female with past medical history significant for HTN, HLD, hypothyroidism who presented to [facility] ED on 03/10/2024 from home via EMS with confusion, cough.  History was obtained by family member, over the last 1 or 2 days patient had evidence of confusion, not oriented to year month, patient has also having cough and vomiting.   In the ED, temperature 102.3 F, HR 88, RR 22, BP 195/97, SpO2 93% on room air.  WBC 8.1, hemoglobin 15.0, platelet count 248.  Sodium 137, potassium 4.1, chloride 101, CO2 24, glucose 173, BUN 15, creatinine 1.09.  AST 25, ALT 21, total bilirubin 0.7.  Lactic acid 2.2.  Influenza A PCR positive.  Influenza B/RSV/COVID PCR negative.  Urinalysis unrevealing.  Chest x-ray with increased interstitial markings, query mild pulmonary edema, low lung volumes.  In the ED, patient was started on empiric antibiotics with cefepime , Flagyl , vancomycin  and started on Tamiflu .  Received LR bolus.  TRH consulted for admission for further evaluation and management of sepsis secondary to influenza A infection.  Assessment & Plan:   Acute metabolic encephalopathy, POA Sepsis, POA Influenza A viral infection Acute hypoxic respiratory failure Patient presenting to the ED with confusion, elevated temperature 102.3 F.  Additionally patient tachypneic.  Influenza A PCR positive. -- Tamiflu  30 m p.o. daily -- Incentive spirometry/flutter valve -- Droplet precautions -- Ambulatory O2 screen tomorrow  Acute renal failure on CKD stage 2 -- Cr 1.09>1.02>1.06>1.47>2.22 -- Hold home lisinopril  -- IVF w/ NS at 75 mL/h -- BMP in am  HTN -- Amlodipine  10 mg p.o. daily -- Metoprolol  tartrate 25 g p.o. twice daily -- Hydralazine  25 mg p.o. every 8 hours -- Holding home lisinopril  due to AKI as above  HLD -- Pravastatin  10  mg p.o. daily  Hypothyroidism -- Levothyroxine  88 mcg p.o. daily  Weakness/ability/deconditioning: -- PT/OT who recommends home health on discharge, TOC following.  Home with orders placed.  DVT prophylaxis: enoxaparin  (LOVENOX ) injection 30 mg Start: 03/13/24 2200 SCDs Start: 03/10/24 0334    Code Status: Full Code Family Communication: No family present bedside this morning  Disposition Plan:  Level of care: Telemetry Status is: Inpatient Remains inpatient appropriate because: Needs further improvement of confusion    Consultants:  None  Procedures:  None  Antimicrobials:  Vancomycin  12/7 - 12/7 Metronidazole  12/7 - 12/7 Cefepime  12/7 - 12/7 Azithromycin  12/8 - 12/10 Ceftriaxone  12/8 - 12/11 Tamiflu  12/7>>   Subjective: Patient seen examined bedside, sitting in bedside chair.  Continues with mild shortness of breath.  Remains on 2 L nasal cannula.  No other question concerns at this time.  Denies headache, no dizziness, no chest pain, no palpitations, no abdominal pain, no fever/chills/night sweats, no nausea/vomiting/diarrhea, no focal weakness, no fatigue, no paresthesia.  No acute events overnight per nurse staff.  Objective: Vitals:   03/13/24 0500 03/13/24 0918 03/13/24 1000 03/13/24 1412  BP:    118/71  Pulse:    76  Resp:    17  Temp:    98.6 F (37 C)  TempSrc:    Oral  SpO2:  97% 95%   Weight: 99 kg     Height:        Intake/Output Summary (Last 24 hours) at 03/13/2024 1806 Last data filed at 03/13/2024 1400 Gross per 24 hour  Intake 1186.69  ml  Output 250 ml  Net 936.69 ml   Filed Weights   03/11/24 0500 03/12/24 0500 03/13/24 0500  Weight: 99.8 kg 99 kg 99 kg    Examination:  Physical Exam: GEN: NAD, alert and oriented x 3, elderly, ill in appearance. HEENT: NCAT, PERRL, EOMI, sclera clear, MMM PULM: Sounds slight diminished bilateral bases, no wheezes/crackles, normal respiratory effort without accessory muscle use, on 2 L [place],  with SpO2 95% at rest. CV: RRR w/o M/G/R GI: abd soft, NTND, NABS, no R/G/M MSK: no peripheral edema, moves all extremity independently NEURO: No focal neurological deficit PSYCH: normal mood/affect Integumentary: dry/intact, no rashes or wounds    Data Reviewed: I have personally reviewed following labs and imaging studies  CBC: Recent Labs  Lab 03/10/24 0028 03/10/24 0513 03/11/24 0318 03/12/24 0328 03/13/24 0545  WBC 8.1 9.1 4.6 5.2 6.3  NEUTROABS 7.4 8.0*  --   --   --   HGB 15.0 13.9 12.2 13.6 13.0  HCT 45.2 42.9 37.4 40.8 39.1  MCV 88.8 91.9 89.0 87.7 89.1  PLT 248 214 188 208 216   Basic Metabolic Panel: Recent Labs  Lab 03/10/24 0028 03/10/24 0513 03/11/24 0318 03/12/24 0328 03/13/24 0545  NA 137 138 139 140 140  K 4.1 4.1 3.5 3.6 3.7  CL 101 101 104 102 103  CO2 24 25 25 24 26   GLUCOSE 173* 129* 103* 125* 118*  BUN 15 14 16  24* 37*  CREATININE 1.09* 1.02* 1.06* 1.47* 2.22*  CALCIUM 10.3 10.0 9.1 9.0 9.3  MG  --  1.9 1.9  --   --   PHOS  --  2.1* 3.7 5.6*  --    GFR: Estimated Creatinine Clearance: 26.5 mL/min (A) (by C-G formula based on SCr of 2.22 mg/dL (H)). Liver Function Tests: Recent Labs  Lab 03/10/24 0028 03/12/24 0328  AST 25 30  ALT 21 21  ALKPHOS 135* 91  BILITOT 0.7 0.5  PROT 8.7* 7.0  ALBUMIN 4.5 3.6   No results for input(s): LIPASE, AMYLASE in the last 168 hours. Recent Labs  Lab 03/10/24 1440 03/12/24 0328  AMMONIA 65* 18   Coagulation Profile: Recent Labs  Lab 03/10/24 0838  INR 1.1   Cardiac Enzymes: Recent Labs  Lab 03/10/24 0838  CKTOTAL 209   BNP (last 3 results) Recent Labs    03/10/24 0838  PROBNP 1,311.0*   HbA1C: No results for input(s): HGBA1C in the last 72 hours. CBG: No results for input(s): GLUCAP in the last 168 hours. Lipid Profile: No results for input(s): CHOL, HDL, LDLCALC, TRIG, CHOLHDL, LDLDIRECT in the last 72 hours. Thyroid  Function Tests: No results for  input(s): TSH, T4TOTAL, FREET4, T3FREE, THYROIDAB in the last 72 hours. Anemia Panel: No results for input(s): VITAMINB12, FOLATE, FERRITIN, TIBC, IRON, RETICCTPCT in the last 72 hours. Sepsis Labs: Recent Labs  Lab 03/10/24 0031 03/10/24 0304 03/10/24 0513 03/10/24 0838 03/10/24 1440  PROCALCITON  --   --   --  0.13  --   LATICACIDVEN 2.2* 2.2* 3.1*  --  1.8    Recent Results (from the past 240 hours)  Blood culture (routine x 2)     Status: None (Preliminary result)   Collection Time: 03/10/24 12:25 AM   Specimen: BLOOD RIGHT HAND  Result Value Ref Range Status   Specimen Description   Final    BLOOD RIGHT HAND Performed at [facility], [address] [place], [place] [address]    Special Requests  Final    BOTTLES DRAWN AEROBIC AND ANAEROBIC Blood Culture results may not be optimal due to an inadequate volume of blood received in culture bottles Performed at [facility], [address], [place], [place] [address]    Culture   Final    NO GROWTH 3 DAYS Performed at [facility], [address] [place], [place] [address]    Report Status PENDING  Incomplete  Resp panel by RT-PCR (RSV, Flu A&B, Covid) Anterior Nasal Swab     Status: Abnormal   Collection Time: 03/10/24 12:27 AM   Specimen: Anterior Nasal Swab  Result Value Ref Range Status   SARS Coronavirus 2 by RT PCR NEGATIVE NEGATIVE Final    Comment: (NOTE) SARS-CoV-2 target nucleic acids are NOT DETECTED.  The SARS-CoV-2 RNA is generally detectable in upper respiratory specimens during the acute phase of infection. The lowest concentration of SARS-CoV-2 viral copies this assay can detect is 138 copies/mL. A negative result does not preclude SARS-Cov-2 infection and should not be used as the sole basis for treatment or other patient management decisions. A negative result may occur with  improper specimen collection/handling, submission of specimen  other than nasopharyngeal swab, presence of viral mutation(s) within the areas targeted by this assay, and inadequate number of viral copies(<138 copies/mL). A negative result must be combined with clinical observations, patient history, and epidemiological information. The expected result is Negative.  Fact Sheet for Patients:  bloggercourse.com  Fact Sheet for Healthcare Providers:  seriousbroker.it  This test is no t yet approved or cleared by the [place]  FDA and  has been authorized for detection and/or diagnosis of SARS-CoV-2 by FDA under an Emergency Use Authorization (EUA). This EUA will remain  in effect (meaning this test can be used) for the duration of the COVID-19 declaration under Section 564(b)(1) of the Act, 21 U.S.C.section 360bbb-3(b)(1), unless the authorization is terminated  or revoked sooner.       Influenza A by PCR POSITIVE (A) NEGATIVE Final   Influenza B by PCR NEGATIVE NEGATIVE Final    Comment: (NOTE) The Xpert Xpress SARS-CoV-2/FLU/RSV plus assay is intended as an aid in the diagnosis of influenza from Nasopharyngeal swab specimens and should not be used as a sole basis for treatment. Nasal washings and aspirates are unacceptable for Xpert Xpress SARS-CoV-2/FLU/RSV testing.  Fact Sheet for Patients: bloggercourse.com  Fact Sheet for Healthcare Providers: seriousbroker.it  This test is not yet approved or cleared by the [place]  FDA and has been authorized for detection and/or diagnosis of SARS-CoV-2 by FDA under an Emergency Use Authorization (EUA). This EUA will remain in effect (meaning this test can be used) for the duration of the COVID-19 declaration under Section 564(b)(1) of the Act, 21 U.S.C. section 360bbb-3(b)(1), unless the authorization is terminated or revoked.     Resp Syncytial Virus by PCR NEGATIVE NEGATIVE Final     Comment: (NOTE) Fact Sheet for Patients: bloggercourse.com  Fact Sheet for Healthcare Providers: seriousbroker.it  This test is not yet approved or cleared by the [place]  FDA and has been authorized for detection and/or diagnosis of SARS-CoV-2 by FDA under an Emergency Use Authorization (EUA). This EUA will remain in effect (meaning this test can be used) for the duration of the COVID-19 declaration under Section 564(b)(1) of the Act, 21 U.S.C. section 360bbb-3(b)(1), unless the authorization is terminated or revoked.  Performed at [facility], [address] [place], [place] [address]   Urine Culture  Status: None   Collection Time: 03/10/24 12:47 AM   Specimen: Urine, Catheterized  Result Value Ref Range Status   Specimen Description   Final    URINE, CATHETERIZED Performed at [facility], [address], [place], [place] [address]    Special Requests   Final    NONE Performed at [facility], [address] [place], [place] [address]    Culture   Final    NO GROWTH Performed at [facility], [address], [place], [place] [address]    Report Status 03/11/2024 FINAL  Final  Blood culture (routine x 2)     Status: None (Preliminary result)   Collection Time: 03/10/24  1:55 AM   Specimen: BLOOD  Result Value Ref Range Status   Specimen Description   Final    BLOOD RIGHT ANTECUBITAL Performed at [facility], [address], [place], [place] [address]    Special Requests   Final    BOTTLES DRAWN AEROBIC AND ANAEROBIC Blood Culture results may not be optimal due to an inadequate volume of blood received in culture bottles Performed at [facility], [address], [place], [place] [address]    Culture   Final    NO GROWTH 3 DAYS Performed at [facility], [address] [place], [place] [address]     Report Status PENDING  Incomplete  MRSA Next Gen by PCR, Nasal     Status: None   Collection Time: 03/10/24  9:15 AM   Specimen: Nasal Mucosa; Nasal Swab  Result Value Ref Range Status   MRSA by PCR Next Gen NOT DETECTED NOT DETECTED Final    Comment: (NOTE) The GeneXpert MRSA Assay (FDA approved for NASAL specimens only), is one component of a comprehensive MRSA colonization surveillance program. It is not intended to diagnose MRSA infection nor to guide or monitor treatment for MRSA infections. Test performance is not FDA approved in patients less than 81 years old. Performed at [facility], [address], [place], [place] [address]          Radiology Studies: No results found.      Scheduled Meds:  amLODipine   10 mg Oral Daily   brimonidine   1 drop Right Eye QHS   enoxaparin  (LOVENOX ) injection  30 mg Subcutaneous Q24H   guaiFENesin   600 mg Oral BID   hydrALAZINE   25 mg Oral Q8H   ipratropium-albuterol   3 mL Nebulization TID   latanoprost   1 drop Both Eyes QHS   levothyroxine   88 mcg Oral Q0600   metoprolol  tartrate  25 mg Oral BID   oseltamivir   30 mg Oral Daily   pravastatin   10 mg Oral Daily   Continuous Infusions:  sodium chloride  75 mL/hr at 03/13/24 1021     LOS: 3 days    Time spent: 52 minutes spent on 03/13/2024 caring for this patient face-to-face including chart review, ordering labs/tests, documenting, discussion with nursing staff, consultants, updating family and interview/physical exam    Kari PARAS Genie Mirabal, DO [facility] Available via Epic secure chat 7am-7pm After these hours, please refer to coverage provider listed on [email or web address] 03/13/2024, 6:06 PM

## 2024-03-13 NOTE — Evaluation (Signed)
 Occupational Therapy Evaluation Patient Details Name: Kari Coleman MRN: [id number] DOB: 1951-01-19 Today's Date: 03/13/2024   History of Present Illness   Kari Coleman is a 73 yr old female who was admitted to [facility] on 03/10/2024 with suspected acute metabolic encephalopathy in the setting of influenza A infection after presenting from home to [facility] ED complaining of altered mental status.  PMH: essential pretension, acquired hypothyroidism, hyperlipidemia,      Clinical Impressions The pt is currently presenting below her baseline level of functioning for self-care management. She is limited by the below listed deficits (see OT problem list). During the session today, she required CGA to min assist for tasks, including sit to stand using a RW, lower body dressing, and for toileting management at bathroom level. She will benefit from further OT services to maximize her independence with self care tasks and to decrease the risk for further weakness and deconditioning. Home health OT is recommended.      If plan is discharge home, recommend the following:   Help with stairs or ramp for entrance;Assistance with cooking/housework;A little help with bathing/dressing/bathroom     Functional Status Assessment   Patient has had a recent decline in their functional status and demonstrates the ability to make significant improvements in function in a reasonable and predictable amount of time.     Equipment Recommendations   None recommended by OT     Recommendations for Other Services         Precautions/Restrictions   Restrictions Weight Bearing Restrictions Per Provider Order: No     Mobility Bed Mobility      General bed mobility comments: pt was received seated in the bedside chair    Transfers Overall transfer level: Needs assistance Equipment used: Rolling walker (2 wheels) Transfers: Sit to/from Stand Sit to Stand: Contact guard assist          Balance     Sitting balance-Leahy Scale: Good       Standing balance-Leahy Scale: Fair           ADL either performed or assessed with clinical judgement   ADL Overall ADL's : Needs assistance/impaired Eating/Feeding: Independent;Sitting   Grooming: Contact guard assist;Standing Grooming Details (indicate cue type and reason): She performed hand washing standing at the sink. OT provided 1 verbal cue for her to step closer to the sink when performing task. Upper Body Bathing: Set up;Sitting   Lower Body Bathing: Minimal assistance;Sit to/from stand;Sitting/lateral leans   Upper Body Dressing : Set up;Sitting   Lower Body Dressing: Sitting/lateral leans;Minimal assistance Lower Body Dressing Details (indicate cue type and reason): Assist needed for sock management with the pt seated in the chair Toilet Transfer: Contact guard assist;Ambulation;Rolling walker (2 wheels);Grab bars Toilet Transfer Details (indicate cue type and reason): Pt ambulated to & from the bathroom in her room using a RW. Toileting- Clothing Manipulation and Hygiene: Sit to/from stand;Cueing for sequencing;Cueing for safety;Contact guard assist Toileting - Clothing Manipulation Details (indicate cue type and reason): The pt required assist to perform toileting management at bathroom level. She required CGA for steadying assist in standing and cues for walker placement. She performed seated hygiene in sitting with SBA.             Vision   Additional Comments: She correctly read the time depicted on the wall clock.            Pertinent Vitals/Pain Pain Assessment Pain Assessment: No/denies pain     Extremity/Trunk  Assessment Upper Extremity Assessment Upper Extremity Assessment: Right hand dominant;LUE deficits/detail;RUE deficits/detail RUE Deficits / Details: AROM WFL. Grip strength 4+/5 LUE Deficits / Details: AROM WFL. Grip strength 4+/5   Lower Extremity Assessment Lower Extremity  Assessment: LLE deficits/detail;RLE deficits/detail;Generalized weakness RLE Deficits / Details: AROM WFL LLE Deficits / Details: AROM WFL       Communication Communication Communication: No apparent difficulties   Cognition Arousal: Alert Behavior During Therapy: WFL for tasks assessed/performed Cognition: No apparent impairments             OT - Cognition Comments: Oriented x4        Following commands: Intact       Cueing  General Comments   Cueing Techniques: Verbal cues              Home Living Family/patient expects to be discharged to:: Private residence Living Arrangements: Children (daughter)   Type of Home: Apartment Home Access: Level entry     Home Layout: One level     Bathroom Shower/Tub: Tub/shower unit         Home Equipment: Rollator (4 wheels);Rolling Walker (2 wheels);Cane - single point;Shower seat          Prior Functioning/Environment Prior Level of Function : Independent/Modified Independent             Mobility Comments:  (She used a rollator inside the home & cane outside the home.) ADLs Comments:  (She was modified independent to independent with ADLs and light cleaning. Her daughter managed the cooking.)    OT Problem List: Decreased strength;Impaired balance (sitting and/or standing);Decreased knowledge of use of DME or AE   OT Treatment/Interventions: Self-care/ADL training;Therapeutic exercise;Energy conservation;DME and/or AE instruction;Therapeutic activities;Patient/family education;Balance training      OT Goals(Current goals can be found in the care plan section)   Acute Rehab OT Goals OT Goal Formulation: With patient Time For Goal Achievement: 03/27/24 Potential to Achieve Goals: Good ADL Goals Pt Will Perform Grooming: with modified independence;standing Pt Will Perform Lower Body Dressing: with modified independence;sit to/from stand;sitting/lateral leans Pt Will Transfer to Toilet: with  modified independence;ambulating Pt Will Perform Toileting - Clothing Manipulation and hygiene: with modified independence;sit to/from stand   OT Frequency:  Min 2X/week       AM-PAC OT 6 Clicks Daily Activity     Outcome Measure Help from another person eating meals?: None Help from another person taking care of personal grooming?: A Little Help from another person toileting, which includes using toliet, bedpan, or urinal?: A Little Help from another person bathing (including washing, rinsing, drying)?: A Little Help from another person to put on and taking off regular upper body clothing?: A Little Help from another person to put on and taking off regular lower body clothing?: A Little 6 Click Score: 19   End of Session Equipment Utilized During Treatment: Gait belt;Rolling walker (2 wheels) Nurse Communication: Mobility status  Activity Tolerance: Patient tolerated treatment well Patient left: in chair;with call bell/phone within reach;with chair alarm set  OT Visit Diagnosis: Muscle weakness (generalized) (M62.81);Other abnormalities of gait and mobility (R26.89)                Time: 8956-8891 OT Time Calculation (min): 25 min Charges:  OT General Charges $OT Visit: 1 Visit OT Evaluation $OT Eval Moderate Complexity: 1 Mod OT Treatments $Self Care/Home Management : 8-22 mins    Delanna JINNY Lesches, OTR/L 03/13/2024, 2:47 PM

## 2024-03-14 LAB — BASIC METABOLIC PANEL WITH GFR
Anion gap: 13 (ref 5–15)
BUN: 32 mg/dL — ABNORMAL HIGH (ref 8–23)
CO2: 23 mmol/L (ref 22–32)
Calcium: 9.1 mg/dL (ref 8.9–10.3)
Chloride: 104 mmol/L (ref 98–111)
Creatinine, Ser: 1.46 mg/dL — ABNORMAL HIGH (ref 0.44–1.00)
GFR, Estimated: 37 mL/min — ABNORMAL LOW (ref >60)
Glucose, Bld: 119 mg/dL — ABNORMAL HIGH (ref 70–99)
Potassium: 3.4 mmol/L — ABNORMAL LOW (ref 3.5–5.1)
Sodium: 140 mmol/L (ref 135–145)

## 2024-03-14 MED ORDER — POLYETHYLENE GLYCOL 3350 17 G PO PACK
17.0000 g | PACK | Freq: Every day | ORAL | Status: DC | PRN
  Administered 2024-03-14: 17 g via ORAL
  Filled 2024-03-14: qty 1

## 2024-03-14 MED ORDER — SODIUM CHLORIDE 0.9 % IV SOLN: INTRAVENOUS | Status: AC

## 2024-03-14 MED ORDER — IPRATROPIUM-ALBUTEROL 0.5-2.5 (3) MG/3ML IN SOLN: 3.0000 mL | RESPIRATORY_TRACT | Status: DC | PRN

## 2024-03-14 MED ORDER — ENOXAPARIN SODIUM 40 MG/0.4ML IJ SOSY
40.0000 mg | PREFILLED_SYRINGE | INTRAMUSCULAR | Status: DC
  Administered 2024-03-14: 40 mg via SUBCUTANEOUS
  Filled 2024-03-14: qty 0.4

## 2024-03-14 MED ORDER — POTASSIUM CHLORIDE CRYS ER 20 MEQ PO TBCR
30.0000 meq | EXTENDED_RELEASE_TABLET | ORAL | Status: AC
  Administered 2024-03-14 (×2): 30 meq via ORAL
  Filled 2024-03-14 (×2): qty 1

## 2024-03-14 NOTE — TOC Progression Note (Addendum)
 Transition of Care [facility]) - Progression Note    Patient Details  Name: Kari Coleman MRN: [id number] Date of Birth: 07-08-1950  [facility] CM/SW Contact  Sonda Manuella Quill, RN Phone Number: 03/14/2024, 2:07 PM  Clinical Narrative:    Referral for HHPT/OT accepted in hub by Marchelle Slough at agency notified d/c date TBD; LVM for pt's dtr Camelia Bihari [phone]); agency contact info placed in follow up provider section of d/c instructions; awaiting return call to notify her.  -[phone]- return call from pt's dtr Camelia Bihari; she was notified [facility] has accepted referral for HHPT/OT; she agreed to d/c plan.  Expected Discharge Plan:  (TBD) Barriers to Discharge: Continued Medical Work up               Expected Discharge Plan and Services In-house Referral: NA Discharge Planning Services: CM Consult Post Acute Care Choice: Durable Medical Equipment Living arrangements for the past 2 months: Apartment                 DME Arranged: N/A DME Agency: NA       HH Arranged: NA HH Agency: NA         Social Drivers of Health (SDOH) Interventions SDOH Screenings   Food Insecurity: Patient Unable To Answer (03/10/2024)  Housing: Unknown (03/10/2024)  Transportation Needs: Patient Unable To Answer (03/10/2024)  Utilities: Patient Unable To Answer (03/10/2024)  Alcohol Screen: Low Risk (12/24/2023)  Depression (PHQ2-9): Low Risk (12/24/2023)  Financial Resource Strain: Low Risk (12/24/2023)  Physical Activity: Insufficiently Active (12/24/2023)  Social Connections: Unknown (03/10/2024)  Recent Concern: Social Connections - Moderately Isolated (12/24/2023)  Stress: No Stress Concern Present (12/24/2023)  Tobacco Use: Medium Risk (03/10/2024)  Health Literacy: Adequate Health Literacy (12/24/2023)    Readmission Risk Interventions    03/12/2024   11:01 AM 07/27/2023   10:19 AM 12/21/2022    1:40 PM  Readmission Risk Prevention Plan  Transportation  Screening Complete Complete Complete  PCP or Specialist Appt within 5-7 Days Complete Complete Complete  Home Care Screening Complete Complete Complete  Medication Review (RN CM) Complete Complete Complete

## 2024-03-14 NOTE — Progress Notes (Signed)
 PT Cancellation Note  Patient Details Name: Kari Coleman MRN: [id number] DOB: 1950-08-29   Cancelled Treatment:    Reason Eval/Treat Not Completed: Patient declined, no reason specified;Other (comment) Pt resting in recliner, continues to report feeling nauseous and with an upset stomach that has improved some with medications provided. Declined participation in PT today secondary to nausea and fatigue. PT will continue to follow.  Isaiah DEL. Dayvin Aber, PT, DPT   [employer] 03/14/2024, 3:40 PM

## 2024-03-14 NOTE — Progress Notes (Signed)
 PROGRESS NOTE    Kari Coleman  FMW:[id number] DOB: 1950/06/01 DOA: 03/10/2024 PCP: Job Lukes, PA    Brief Narrative:   Kari Coleman is a 73 y.o. female with past medical history significant for HTN, HLD, hypothyroidism who presented to [facility] ED on 03/10/2024 from home via EMS with confusion, cough.  History was obtained by family member, over the last 1 or 2 days patient had evidence of confusion, not oriented to year month, patient has also having cough and vomiting.   In the ED, temperature 102.3 F, HR 88, RR 22, BP 195/97, SpO2 93% on room air.  WBC 8.1, hemoglobin 15.0, platelet count 248.  Sodium 137, potassium 4.1, chloride 101, CO2 24, glucose 173, BUN 15, creatinine 1.09.  AST 25, ALT 21, total bilirubin 0.7.  Lactic acid 2.2.  Influenza A PCR positive.  Influenza B/RSV/COVID PCR negative.  Urinalysis unrevealing.  Chest x-ray with increased interstitial markings, query mild pulmonary edema, low lung volumes.  In the ED, patient was started on empiric antibiotics with cefepime , Flagyl , vancomycin  and started on Tamiflu .  Received LR bolus.  TRH consulted for admission for further evaluation and management of sepsis secondary to influenza A infection.  Assessment & Plan:   Acute metabolic encephalopathy, POA Sepsis, POA Influenza A viral infection Acute hypoxic respiratory failure Patient presenting to the ED with confusion, elevated temperature 102.3 F.  Additionally patient tachypneic.  Influenza A PCR positive. -- Tamiflu  30 mg p.o. daily -- Incentive spirometry/flutter valve -- Droplet precautions -- Ambulatory O2 screen today  Acute renal failure on CKD stage 2 -- Cr 1.09>1.02>1.06>1.47>2.22>1.46 -- Hold home lisinopril  -- IVF w/ NS at 75 mL/h -- BMP in am  HTN -- Amlodipine  10 mg p.o. daily -- Metoprolol  tartrate 25 g p.o. twice daily -- Hydralazine  25 mg p.o. every 8 hours -- Holding home lisinopril  due to AKI as above  HLD -- Pravastatin   10 mg p.o. daily  Hypothyroidism -- Levothyroxine  88 mcg p.o. daily  Weakness/ability/deconditioning: -- PT/OT who recommends home health on discharge, TOC following.  Home health orders placed.  DVT prophylaxis: enoxaparin  (LOVENOX ) injection 40 mg Start: 03/14/24 2200 SCDs Start: 03/10/24 0334    Code Status: Full Code Family Communication: No family present bedside this morning  Disposition Plan:  Level of care: Telemetry Status is: Inpatient Remains inpatient appropriate because: Needs further improvement of confusion    Consultants:  None  Procedures:  None  Antimicrobials:  Vancomycin  12/7 - 12/7 Metronidazole  12/7 - 12/7 Cefepime  12/7 - 12/7 Azithromycin  12/8 - 12/10 Ceftriaxone  12/8 - 12/11 Tamiflu  12/7>>   Subjective: Patient seen examined bedside, sitting in bedside chair.  Dyspnea improved.  Now off of oxygen at rest.  Continues with mild shortness of breath.  No other question concerns at this time.  Denies headache, no dizziness, no chest pain, no palpitations, no abdominal pain, no fever/chills/night sweats, no nausea/vomiting/diarrhea, no focal weakness, no fatigue, no paresthesia.  No acute events overnight per nursing staff.  Objective: Vitals:   03/14/24 0500 03/14/24 0940 03/14/24 1143 03/14/24 1454  BP:   (!) 154/83   Pulse:   67   Resp:   16   Temp:   98.3 F (36.8 C)   TempSrc:   Oral   SpO2:  95%  96%  Weight: 95.2 kg     Height:        Intake/Output Summary (Last 24 hours) at 03/14/2024 1519 Last data filed at 03/14/2024 1500 Gross per 24  hour  Intake 992.71 ml  Output 600 ml  Net 392.71 ml   Filed Weights   03/12/24 0500 03/13/24 0500 03/14/24 0500  Weight: 99 kg 99 kg 95.2 kg    Examination:  Physical Exam: GEN: NAD, alert and oriented x 3, elderly, ill in appearance. HEENT: NCAT, PERRL, EOMI, sclera clear, MMM PULM: Sounds slight diminished bilateral bases, no wheezes/crackles, normal respiratory effort without accessory  muscle use, on room air, with SpO2 96% at rest. CV: RRR w/o M/G/R GI: abd soft, NTND, + BS MSK: no peripheral edema, moves all extremites independently NEURO: No focal neurological deficit PSYCH: normal mood/affect Integumentary: dry/intact, no rashes or wounds    Data Reviewed: I have personally reviewed following labs and imaging studies  CBC: Recent Labs  Lab 03/10/24 0028 03/10/24 0513 03/11/24 0318 03/12/24 0328 03/13/24 0545  WBC 8.1 9.1 4.6 5.2 6.3  NEUTROABS 7.4 8.0*  --   --   --   HGB 15.0 13.9 12.2 13.6 13.0  HCT 45.2 42.9 37.4 40.8 39.1  MCV 88.8 91.9 89.0 87.7 89.1  PLT 248 214 188 208 216   Basic Metabolic Panel: Recent Labs  Lab 03/10/24 0513 03/11/24 0318 03/12/24 0328 03/13/24 0545 03/14/24 0543  NA 138 139 140 140 140  K 4.1 3.5 3.6 3.7 3.4*  CL 101 104 102 103 104  CO2 25 25 24 26 23   GLUCOSE 129* 103* 125* 118* 119*  BUN 14 16 24* 37* 32*  CREATININE 1.02* 1.06* 1.47* 2.22* 1.46*  CALCIUM 10.0 9.1 9.0 9.3 9.1  MG 1.9 1.9  --   --   --   PHOS 2.1* 3.7 5.6*  --   --    GFR: Estimated Creatinine Clearance: 39.4 mL/min (A) (by C-G formula based on SCr of 1.46 mg/dL (H)). Liver Function Tests: Recent Labs  Lab 03/10/24 0028 03/12/24 0328  AST 25 30  ALT 21 21  ALKPHOS 135* 91  BILITOT 0.7 0.5  PROT 8.7* 7.0  ALBUMIN 4.5 3.6   No results for input(s): LIPASE, AMYLASE in the last 168 hours. Recent Labs  Lab 03/10/24 1440 03/12/24 0328  AMMONIA 65* 18   Coagulation Profile: Recent Labs  Lab 03/10/24 0838  INR 1.1   Cardiac Enzymes: Recent Labs  Lab 03/10/24 0838  CKTOTAL 209   BNP (last 3 results) Recent Labs    03/10/24 0838  PROBNP 1,311.0*   HbA1C: No results for input(s): HGBA1C in the last 72 hours. CBG: No results for input(s): GLUCAP in the last 168 hours. Lipid Profile: No results for input(s): CHOL, HDL, LDLCALC, TRIG, CHOLHDL, LDLDIRECT in the last 72 hours. Thyroid  Function Tests: No  results for input(s): TSH, T4TOTAL, FREET4, T3FREE, THYROIDAB in the last 72 hours. Anemia Panel: No results for input(s): VITAMINB12, FOLATE, FERRITIN, TIBC, IRON, RETICCTPCT in the last 72 hours. Sepsis Labs: Recent Labs  Lab 03/10/24 0031 03/10/24 0304 03/10/24 0513 03/10/24 0838 03/10/24 1440  PROCALCITON  --   --   --  0.13  --   LATICACIDVEN 2.2* 2.2* 3.1*  --  1.8    Recent Results (from the past 240 hours)  Blood culture (routine x 2)     Status: None (Preliminary result)   Collection Time: 03/10/24 12:25 AM   Specimen: BLOOD RIGHT HAND  Result Value Ref Range Status   Specimen Description   Final    BLOOD RIGHT HAND Performed at [facility], [address], [place], [place] [address]  Special Requests   Final    BOTTLES DRAWN AEROBIC AND ANAEROBIC Blood Culture results may not be optimal due to an inadequate volume of blood received in culture bottles Performed at [facility], [address], [place], [place] [address]    Culture   Final    NO GROWTH 4 DAYS Performed at [facility], [address], [place], [place] [address]    Report Status PENDING  Incomplete  Resp panel by RT-PCR (RSV, Flu A&B, Covid) Anterior Nasal Swab     Status: Abnormal   Collection Time: 03/10/24 12:27 AM   Specimen: Anterior Nasal Swab  Result Value Ref Range Status   SARS Coronavirus 2 by RT PCR NEGATIVE NEGATIVE Final    Comment: (NOTE) SARS-CoV-2 target nucleic acids are NOT DETECTED.  The SARS-CoV-2 RNA is generally detectable in upper respiratory specimens during the acute phase of infection. The lowest concentration of SARS-CoV-2 viral copies this assay can detect is 138 copies/mL. A negative result does not preclude SARS-Cov-2 infection and should not be used as the sole basis for treatment or other patient management decisions. A negative result may occur with  improper specimen collection/handling, submission of  specimen other than nasopharyngeal swab, presence of viral mutation(s) within the areas targeted by this assay, and inadequate number of viral copies(<138 copies/mL). A negative result must be combined with clinical observations, patient history, and epidemiological information. The expected result is Negative.  Fact Sheet for Patients:  bloggercourse.com  Fact Sheet for Healthcare Providers:  seriousbroker.it  This test is no t yet approved or cleared by the [place]  FDA and  has been authorized for detection and/or diagnosis of SARS-CoV-2 by FDA under an Emergency Use Authorization (EUA). This EUA will remain  in effect (meaning this test can be used) for the duration of the COVID-19 declaration under Section 564(b)(1) of the Act, 21 U.S.C.section 360bbb-3(b)(1), unless the authorization is terminated  or revoked sooner.       Influenza A by PCR POSITIVE (A) NEGATIVE Final   Influenza B by PCR NEGATIVE NEGATIVE Final    Comment: (NOTE) The Xpert Xpress SARS-CoV-2/FLU/RSV plus assay is intended as an aid in the diagnosis of influenza from Nasopharyngeal swab specimens and should not be used as a sole basis for treatment. Nasal washings and aspirates are unacceptable for Xpert Xpress SARS-CoV-2/FLU/RSV testing.  Fact Sheet for Patients: bloggercourse.com  Fact Sheet for Healthcare Providers: seriousbroker.it  This test is not yet approved or cleared by the [place]  FDA and has been authorized for detection and/or diagnosis of SARS-CoV-2 by FDA under an Emergency Use Authorization (EUA). This EUA will remain in effect (meaning this test can be used) for the duration of the COVID-19 declaration under Section 564(b)(1) of the Act, 21 U.S.C. section 360bbb-3(b)(1), unless the authorization is terminated or revoked.     Resp Syncytial Virus by PCR NEGATIVE NEGATIVE  Final    Comment: (NOTE) Fact Sheet for Patients: bloggercourse.com  Fact Sheet for Healthcare Providers: seriousbroker.it  This test is not yet approved or cleared by the [place]  FDA and has been authorized for detection and/or diagnosis of SARS-CoV-2 by FDA under an Emergency Use Authorization (EUA). This EUA will remain in effect (meaning this test can be used) for the duration of the COVID-19 declaration under Section 564(b)(1) of the Act, 21 U.S.C. section 360bbb-3(b)(1), unless the authorization is terminated or revoked.  Performed at [facility], [address] [place], [place] [address]  Urine Culture     Status: None   Collection Time: 03/10/24 12:47 AM   Specimen: Urine, Catheterized  Result Value Ref Range Status   Specimen Description   Final    URINE, CATHETERIZED Performed at [facility], [address], [place], [place] [address]    Special Requests   Final    NONE Performed at [facility], [address], [place], [place] [address]    Culture   Final    NO GROWTH Performed at [facility], [address], [place], [place] [address]    Report Status 03/11/2024 FINAL  Final  Blood culture (routine x 2)     Status: None (Preliminary result)   Collection Time: 03/10/24  1:55 AM   Specimen: BLOOD  Result Value Ref Range Status   Specimen Description   Final    BLOOD RIGHT ANTECUBITAL Performed at [facility], [address], [place], [place] [address]    Special Requests   Final    BOTTLES DRAWN AEROBIC AND ANAEROBIC Blood Culture results may not be optimal due to an inadequate volume of blood received in culture bottles Performed at [facility], [address], [place], [place] [address]    Culture   Final    NO GROWTH 4 DAYS Performed at [facility], [address], [place], [place]  [address]    Report Status PENDING  Incomplete  MRSA Next Gen by PCR, Nasal     Status: None   Collection Time: 03/10/24  9:15 AM   Specimen: Nasal Mucosa; Nasal Swab  Result Value Ref Range Status   MRSA by PCR Next Gen NOT DETECTED NOT DETECTED Final    Comment: (NOTE) The GeneXpert MRSA Assay (FDA approved for NASAL specimens only), is one component of a comprehensive MRSA colonization surveillance program. It is not intended to diagnose MRSA infection nor to guide or monitor treatment for MRSA infections. Test performance is not FDA approved in patients less than 20 years old. Performed at [facility], [address], [place], [place] [address]          Radiology Studies: No results found.      Scheduled Meds:  amLODipine   10 mg Oral Daily   brimonidine   1 drop Right Eye QHS   enoxaparin  (LOVENOX ) injection  40 mg Subcutaneous Q24H   guaiFENesin   600 mg Oral BID   hydrALAZINE   25 mg Oral Q8H   ipratropium-albuterol   3 mL Nebulization TID   latanoprost   1 drop Both Eyes QHS   levothyroxine   88 mcg Oral Q0600   metoprolol  tartrate  25 mg Oral BID   pravastatin   10 mg Oral Daily   Continuous Infusions:  sodium chloride  75 mL/hr at 03/14/24 0804     LOS: 4 days    Time spent: 46 minutes spent on 03/14/2024 caring for this patient face-to-face including chart review, ordering labs/tests, documenting, discussion with nursing staff, consultants, updating family and interview/physical exam    Camellia PARAS Lakendra Helling, DO [facility] Available via Epic secure chat 7am-7pm After these hours, please refer to coverage provider listed on [email or web address] 03/14/2024, 3:19 PM

## 2024-03-14 NOTE — Progress Notes (Signed)
 Patient refuse to walk for home o2 sat , she said she is not feeling for walking today.

## 2024-03-14 NOTE — Plan of Care (Signed)
  Problem: Education: Goal: Knowledge of General Education information will improve Description: Including pain rating scale, medication(s)/side effects and non-pharmacologic comfort measures Outcome: Progressing   Problem: Clinical Measurements: Goal: Ability to maintain clinical measurements within normal limits will improve Outcome: Progressing Goal: Will remain free from infection Outcome: Progressing   Problem: Nutrition: Goal: Adequate nutrition will be maintained Outcome: Progressing   Problem: Coping: Goal: Level of anxiety will decrease Outcome: Progressing   Problem: Elimination: Goal: Will not experience complications related to bowel motility Outcome: Progressing Goal: Will not experience complications related to urinary retention Outcome: Progressing   Problem: Pain Managment: Goal: General experience of comfort will improve and/or be controlled Outcome: Progressing   Problem: Safety: Goal: Ability to remain free from injury will improve Outcome: Progressing

## 2024-03-14 NOTE — Plan of Care (Signed)
?  Problem: Health Behavior/Discharge Planning: ?Goal: Ability to manage health-related needs will improve ?Outcome: Not Progressing ?  ?Problem: Clinical Measurements: ?Goal: Diagnostic test results will improve ?Outcome: Not Progressing ?  ?

## 2024-03-15 ENCOUNTER — Other Ambulatory Visit (HOSPITAL_COMMUNITY): Payer: Self-pay

## 2024-03-15 DIAGNOSIS — G9341 Metabolic encephalopathy: Secondary | ICD-10-CM | POA: Diagnosis not present

## 2024-03-15 LAB — CULTURE, BLOOD (ROUTINE X 2)
Culture: NO GROWTH
Culture: NO GROWTH

## 2024-03-15 MED ORDER — ONDANSETRON HCL 4 MG PO TABS: 4.0000 mg | ORAL_TABLET | Freq: Four times a day (QID) | ORAL | Status: AC | PRN

## 2024-03-15 MED ORDER — HYDRALAZINE HCL 25 MG PO TABS
25.0000 mg | ORAL_TABLET | Freq: Three times a day (TID) | ORAL | 0 refills | Status: DC
  Filled 2024-03-15: qty 270, 90d supply, fill #0

## 2024-03-15 MED ORDER — AMLODIPINE BESYLATE 10 MG PO TABS
10.0000 mg | ORAL_TABLET | Freq: Every day | ORAL | 1 refills | Status: AC
  Filled 2024-03-15: qty 90, 90d supply, fill #0

## 2024-03-15 NOTE — Progress Notes (Signed)
 Discharge meds in a secure bag delivered to patient in room by this RN

## 2024-03-15 NOTE — TOC Transition Note (Signed)
 Transition of Care [facility]) - Discharge Note   Patient Details  Name: Kari Coleman MRN: [id number] Date of Birth: 1950-07-06  Transition of Care [facility]) CM/SW Contact:  Sonda Manuella Quill, RN Phone Number: 03/15/2024, 11:36 AM   Clinical Narrative:    D/C orders received; HHPT/OT arranged w/ Marchelle Slough at [facility] given notification of D/C; no IP CM needs.   Final next level of care: Home w Home Health Services Barriers to Discharge: No Barriers Identified   Patient Goals and CMS Choice Patient states their goals for this hospitalization and ongoing recovery are:: Home CMS Medicare.gov Compare Post Acute Care list provided to:: Patient Represenative (must comment) (Pt daughter) Choice offered to / list presented to : Adult Children [place] ownership interest in [facility].provided to:: Adult Children    Discharge Placement                       Discharge Plan and Services Additional resources added to the After Visit Summary for   In-house Referral: NA Discharge Planning Services: CM Consult Post Acute Care Choice: Durable Medical Equipment          DME Arranged: N/A DME Agency: NA       HH Arranged: PT, OT HH Agency: Other - See comment Damita) Date HH Agency Contacted: 03/14/24 Time HH Agency Contacted: 1416 Representative spoke with at [facility] Agency: [facility]  Social Drivers of Health (SDOH) Interventions SDOH Screenings   Food Insecurity: Patient Unable To Answer (03/10/2024)  Housing: Unknown (03/10/2024)  Transportation Needs: Patient Unable To Answer (03/10/2024)  Utilities: Patient Unable To Answer (03/10/2024)  Alcohol Screen: Low Risk (12/24/2023)  Depression (PHQ2-9): Low Risk (12/24/2023)  Financial Resource Strain: Low Risk (12/24/2023)  Physical Activity: Insufficiently Active (12/24/2023)  Social Connections: Unknown (03/10/2024)  Recent Concern: Social Connections - Moderately Isolated (12/24/2023)  Stress: No Stress Concern  Present (12/24/2023)  Tobacco Use: Medium Risk (03/10/2024)  Health Literacy: Adequate Health Literacy (12/24/2023)     Readmission Risk Interventions    03/12/2024   11:01 AM 07/27/2023   10:19 AM 12/21/2022    1:40 PM  Readmission Risk Prevention Plan  Transportation Screening Complete Complete Complete  PCP or Specialist Appt within 5-7 Days Complete Complete Complete  Home Care Screening Complete Complete Complete  Medication Review (RN CM) Complete Complete Complete

## 2024-03-15 NOTE — Plan of Care (Signed)

## 2024-03-15 NOTE — Progress Notes (Signed)
 Physical Therapy Treatment Patient Details Name: MAURISSA AMBROSE MRN: [id number] DOB: 1950/11/21 Today's Date: 03/15/2024   History of Present Illness LAMA NARAYANAN is a 73 y.o. female with medical history significant for essential pretension, acquired hypothyroidism, hyperlipidemia, who is admitted to [facility] on 03/10/2024 with suspected acute metabolic encephalopathy in the setting of influenza A infection after presenting from home to [facility] ED complaining of altered mental status.    PT Comments  Pt agreeable to working with therapy. She tolerated activity fairly well. She denied dizziness during session. HR 99 bpm, O2 99% on RA. Will continue to follow pt during this hospital stay. Plan is for HHPT.     If plan is discharge home, recommend the following: A little help with walking and/or transfers;A little help with bathing/dressing/bathroom;Assistance with cooking/housework;Assist for transportation   Can travel by private vehicle        Equipment Recommendations  None recommended by PT    Recommendations for Other Services       Precautions / Restrictions Precautions Precautions: Fall Restrictions Weight Bearing Restrictions Per Provider Order: No     Mobility  Bed Mobility               General bed mobility comments: oob in bathroom    Transfers Overall transfer level: Needs assistance Equipment used: Rolling walker (2 wheels) Transfers: Sit to/from Stand Sit to Stand: Contact guard assist           General transfer comment: Increased time. Cues for safety.    Ambulation/Gait Ambulation/Gait assistance: Contact guard assist Gait Distance (Feet): 75 Feet Assistive device: Rolling walker (2 wheels) Gait Pattern/deviations: Step-through pattern, Decreased stride length       General Gait Details: Slow gait speed. Pt denied dizziness. Tolerated distance fairly well. O2 99% on RA, HR 99 bpm.   Stairs             Wheelchair Mobility      Tilt Bed    Modified Rankin (Stroke Patients Only)       Balance Overall balance assessment: Needs assistance         Standing balance support: Bilateral upper extremity supported, During functional activity, Reliant on assistive device for balance Standing balance-Leahy Scale: Fair Standing balance comment: Pt able to wipe herself after using BSC                            Communication Communication Communication: No apparent difficulties  Cognition Arousal: Alert Behavior During Therapy: WFL for tasks assessed/performed   PT - Cognitive impairments: No apparent impairments                         Following commands: Intact      Cueing Cueing Techniques: Verbal cues  Exercises      General Comments        Pertinent Vitals/Pain Pain Assessment Pain Assessment: No/denies pain    Home Living                          Prior Function            PT Goals (current goals can now be found in the care plan section) Progress towards PT goals: Progressing toward goals    Frequency    Min 2X/week      PT Plan      Co-evaluation  AM-PAC PT 6 Clicks Mobility   Outcome Measure  Help needed turning from your back to your side while in a flat bed without using bedrails?: A Little Help needed moving from lying on your back to sitting on the side of a flat bed without using bedrails?: A Little Help needed moving to and from a bed to a chair (including a wheelchair)?: A Little Help needed standing up from a chair using your arms (e.g., wheelchair or bedside chair)?: A Little Help needed to walk in hospital room?: A Little Help needed climbing 3-5 steps with a railing? : A Little 6 Click Score: 18    End of Session Equipment Utilized During Treatment: Gait belt Activity Tolerance: Patient tolerated treatment well;Patient limited by fatigue Patient left: in chair;with call bell/phone within reach;with  nursing/sitter in room   PT Visit Diagnosis: Difficulty in walking, not elsewhere classified (R26.2);Muscle weakness (generalized) (M62.81);History of falling (Z91.81)     Time: 8972-8950 PT Time Calculation (min) (ACUTE ONLY): 22 min  Charges:    $Gait Training: 8-22 mins PT General Charges $$ ACUTE PT VISIT: 1 Visit                        Dannial SQUIBB, PT Acute Rehabilitation  Office: [phone]

## 2024-03-15 NOTE — Plan of Care (Signed)

## 2024-03-15 NOTE — Discharge Summary (Signed)
 Physician Discharge Summary  Kari Coleman FMW:[id number] DOB: Sep 26, 1950 DOA: 03/10/2024  PCP: Job Lukes, PA  Admit date: 03/10/2024 Discharge date: 03/15/2024  Admitted From: Home Disposition: Home  Recommendations for Outpatient Follow-up:  Follow up with PCP in 1-2 weeks Lisinopril  discontinued due to renal dysfunction Amlodipine  increased to 10 mg p.o. daily, started on hydralazine  25 g p.o. 3 times daily Please obtain BMP in one week to reassess renal function, creatinine 1.46 at time of discharge  Home Health: PT/OT Equipment/Devices: None  Discharge Condition: Stable CODE STATUS: Full code Diet recommendation: Heart healthy diet  History of present illness:  Kari Coleman is a 73 y.o. female with past medical history significant for HTN, HLD, hypothyroidism who presented to [facility] ED on 03/10/2024 from home via EMS with confusion, cough.  History was obtained by family member, over the last 1 or 2 days patient had evidence of confusion, not oriented to year month, patient has also having cough and vomiting.    In the ED, temperature 102.3 F, HR 88, RR 22, BP 195/97, SpO2 93% on room air.  WBC 8.1, hemoglobin 15.0, platelet count 248.  Sodium 137, potassium 4.1, chloride 101, CO2 24, glucose 173, BUN 15, creatinine 1.09.  AST 25, ALT 21, total bilirubin 0.7.  Lactic acid 2.2.  Influenza A PCR positive.  Influenza B/RSV/COVID PCR negative.  Urinalysis unrevealing.  Chest x-ray with increased interstitial markings, query mild pulmonary edema, low lung volumes.  In the ED, patient was started on empiric antibiotics with cefepime , Flagyl , vancomycin  and started on Tamiflu .  Received LR bolus.  TRH consulted for admission for further evaluation and management of sepsis secondary to influenza A infection.  Hospital course:  Acute metabolic encephalopathy, POA Sepsis, POA Influenza A viral infection Acute hypoxic respiratory failure Patient presenting to the ED  with confusion, elevated temperature 102.3 F.  Additionally patient tachypneic.  Influenza A PCR positive.  Patient completed course of Tamiflu  while inpatient.  Patient was weaned from supplemental oxygen.  Outpatient follow-up PCP.   Acute renal failure on CKD stage 2 Creatinine peaked at 2.22 during hospitalization, improved to 1.46 at time of discharge.  Discontinued home lisinopril .  Recommend repeat BMP 1 week.   HTN Lisinopril  discontinued due to renal dysfunction.  Amlodipine  increased to 10 mg p.o. daily, continued on metoprolol  tartrate 25 mg p.o. twice daily, started on hydralazine  25 mg p.o. every 8 hours.  Outpatient follow-up PCP.   HLD Pravastatin  10 mg p.o. daily   Hypothyroidism Levothyroxine  88 mcg p.o. daily   Weakness/ability/deconditioning: PT/OT who recommends home health on discharge  Discharge Diagnoses:  Principal Problem:   Acute metabolic encephalopathy Active Problems:   Acquired hypothyroidism   Influenza A   Lactic acidosis   History of essential hypertension   HLD (hyperlipidemia)    Discharge Instructions  Discharge Instructions     Call MD for:  difficulty breathing, headache or visual disturbances   Complete by: As directed    Call MD for:  extreme fatigue   Complete by: As directed    Call MD for:  persistant dizziness or light-headedness   Complete by: As directed    Call MD for:  persistant nausea and vomiting   Complete by: As directed    Call MD for:  severe uncontrolled pain   Complete by: As directed    Call MD for:  temperature >100.4   Complete by: As directed    Increase activity slowly   Complete by: As  directed       Allergies as of 03/15/2024       Reactions   Doxycycline  Nausea Only   Codeine Nausea And Vomiting   Pollen Extract Itching, Other (See Comments)   Congestion, itchy eyes, runny nose        Medication List     STOP taking these medications    B-12 1000 MCG Tabs   lisinopril  40 MG  tablet Commonly known as: ZESTRIL    nitrofurantoin (macrocrystal-monohydrate) 100 MG capsule Commonly known as: MACROBID   senna-docusate 8.6-50 MG tablet Commonly known as: Senokot-S   trimethoprim 100 MG tablet Commonly known as: TRIMPEX       TAKE these medications    acetaminophen  325 MG tablet Commonly known as: TYLENOL  Take 650 mg by mouth every 4 (four) hours as needed for mild pain or moderate pain.   albuterol  108 (90 Base) MCG/ACT inhaler Commonly known as: Proventil  HFA Inhale 2 puffs into the lungs every 6 (six) hours as needed for wheezing or shortness of breath. What changed: Another medication with the same name was removed. Continue taking this medication, and follow the directions you see here.   amLODipine  10 MG tablet Commonly known as: NORVASC  Take 1 tablet (10 mg total) by mouth daily. What changed: Another medication with the same name was removed. Continue taking this medication, and follow the directions you see here.   brimonidine  0.2 % ophthalmic solution Commonly known as: ALPHAGAN  Place 1 drop into the right eye at bedtime.   hydrALAZINE  25 MG tablet Commonly known as: APRESOLINE  Take 1 tablet (25 mg total) by mouth every 8 (eight) hours.   latanoprost  0.005 % ophthalmic solution Commonly known as: XALATAN  Place 1 drop into both eyes at bedtime.   levothyroxine  88 MCG tablet Commonly known as: SYNTHROID  Take 1 tablet (88 mcg total) by mouth daily before breakfast.   metoprolol  tartrate 25 MG tablet Commonly known as: LOPRESSOR  Take 1 tablet (25 mg total) by mouth 2 (two) times daily.   ondansetron  4 MG tablet Commonly known as: ZOFRAN  Take 1 tablet (4 mg total) by mouth every 6 (six) hours.   pantoprazole  40 MG tablet Commonly known as: PROTONIX  Take 1 tablet (40 mg total) by mouth daily.   polyethylene glycol 17 g packet Commonly known as: MIRALAX  / GLYCOLAX  Take 17 g by mouth daily.   pravastatin  10 MG tablet Commonly known  as: PRAVACHOL  TAKE 1 TABLET BY MOUTH DAILY What changed: when to take this   Vitamin D -3 25 MCG (1000 UT) Caps Take 2,000 Units by mouth daily.        Contact information for follow-up providers     Job Lukes, [name]. Schedule an appointment as soon as possible for a visit in 1 week(s).   Specialty: Physician Assistant Contact information: [address] [place] [address] [phone]              Contact information for after-discharge care     Home Medical Care     [facility]) .   Service: Home Health Services Contact information: [phone] [facility] [place]  [address] [phone]                    Allergies[1]  Consultations: None   Procedures/Studies: DG CHEST PORT 1 VIEW Result Date: 03/10/2024 EXAM: 1 VIEW(S) XRAY OF THE CHEST 03/10/2024 04:25:00 AM COMPARISON: 03/10/2024 CLINICAL HISTORY: Intractable nausea and vomiting FINDINGS: LUNGS AND PLEURA: Low lung  volumes. Pulmonary vascular congestion noted without overt airspace pulmonary edema. Asymmetric elevation in right hemidiaphragm. No focal pulmonary opacity. No pleural effusion. No pneumothorax. HEART AND MEDIASTINUM: Cardiomegaly. Calcified aorta. BONES AND SOFT TISSUES: No acute osseous abnormality. IMPRESSION: 1. Cardiomegaly with pulmonary vascular congestion, consistent with mild congestive heart failure physiology. 2. Asymmetric elevation of the right hemidiaphragm. 3. Calcified aorta, compatible with atherosclerosis. Electronically signed by: Camellia Candle MD 03/10/2024 04:32 AM EST RP Workstation: [id number]   DG Chest Portable 1 View Result Date: 03/10/2024 EXAM: 1 VIEW(S) XRAY OF THE CHEST 03/10/2024 12:31:00 AM COMPARISON: 07/24/2023 CLINICAL HISTORY: cough FINDINGS: LUNGS AND PLEURA: Low lung volume. Increased interstitial markings, nonspecific but may represent mild pulmonary edema. No pleural effusion. No pneumothorax. HEART AND  MEDIASTINUM: Aortic arch calcifications. The cardiomediastinal silhouette is unchanged in the setting of patient positioning and AP portable technique. No acute abnormality of the cardiac and mediastinal silhouettes. BONES AND SOFT TISSUES: No acute osseous abnormality. IMPRESSION: 1. Increased interstitial markings, nonspecific but may represent mild pulmonary edema. 2. Low lung volume. Electronically signed by: Morgane Naveau MD 03/10/2024 12:40 AM EST RP Workstation: [id number]     Subjective: Patient seen and examined at bedside, sitting in bedside chair.  Remains off of oxygen.  Completed course of Tamiflu .  Ready for discharge home.  No other questions or concerns at this time.  Denies headache, no dizziness, no chest pain, no palpitations, no shortness of breath, no abdominal pain, no fever/chills/night sweats, no nausea/vomiting/diarrhea, no focal weakness, no fatigue, no paresthesias.  No acute events overnight per nursing staff.  Discharge Exam: Vitals:   03/15/24 0502 03/15/24 1048  BP: (!) 154/87 (!) 151/94  Pulse: 76 82  Resp: 17   Temp: 98.8 F (37.1 C)   SpO2:     Vitals:   03/14/24 2013 03/14/24 2043 03/15/24 0502 03/15/24 1048  BP: (!) 172/76  (!) 154/87 (!) 151/94  Pulse: 78  76 82  Resp: 18  17   Temp: 99.4 F (37.4 C)  98.8 F (37.1 C)   TempSrc: Oral  Oral   SpO2:  96%    Weight:      Height:        Physical Exam: GEN: NAD, alert and oriented x 3, elderly in appearance HEENT: NCAT, PERRL, EOMI, sclera clear, MMM PULM: Sounds slight diminished bilateral bases, no wheezes/crackles, normal respiratory effort without accessory muscle use, on room air, with SpO2 96% at rest. CV: RRR w/o M/G/R GI: abd soft, NTND, + BS MSK: no peripheral edema, moves all extremites independently NEURO: No focal neurological deficit PSYCH: normal mood/affect Integumentary: dry/intact, no rashes or wounds    The results of significant diagnostics from this hospitalization  (including imaging, microbiology, ancillary and laboratory) are listed below for reference.     Microbiology: Recent Results (from the past 240 hours)  Blood culture (routine x 2)     Status: None   Collection Time: 03/10/24 12:25 AM   Specimen: BLOOD RIGHT HAND  Result Value Ref Range Status   Specimen Description   Final    BLOOD RIGHT HAND Performed at [facility], [address] [place], [place] [address]    Special Requests   Final    BOTTLES DRAWN AEROBIC AND ANAEROBIC Blood Culture results may not be optimal due to an inadequate volume of blood received in culture bottles Performed at [facility], [address] [place], [place] [address]    Culture   Final  NO GROWTH 5 DAYS Performed at [facility], [address], [place], [place] [address]    Report Status 03/15/2024 FINAL  Final  Resp panel by RT-PCR (RSV, Flu A&B, Covid) Anterior Nasal Swab     Status: Abnormal   Collection Time: 03/10/24 12:27 AM   Specimen: Anterior Nasal Swab  Result Value Ref Range Status   SARS Coronavirus 2 by RT PCR NEGATIVE NEGATIVE Final    Comment: (NOTE) SARS-CoV-2 target nucleic acids are NOT DETECTED.  The SARS-CoV-2 RNA is generally detectable in upper respiratory specimens during the acute phase of infection. The lowest concentration of SARS-CoV-2 viral copies this assay can detect is 138 copies/mL. A negative result does not preclude SARS-Cov-2 infection and should not be used as the sole basis for treatment or other patient management decisions. A negative result may occur with  improper specimen collection/handling, submission of specimen other than nasopharyngeal swab, presence of viral mutation(s) within the areas targeted by this assay, and inadequate number of viral copies(<138 copies/mL). A negative result must be combined with clinical observations, patient history, and epidemiological information. The expected result is  Negative.  Fact Sheet for Patients:  bloggercourse.com  Fact Sheet for Healthcare Providers:  seriousbroker.it  This test is no t yet approved or cleared by the [place]  FDA and  has been authorized for detection and/or diagnosis of SARS-CoV-2 by FDA under an Emergency Use Authorization (EUA). This EUA will remain  in effect (meaning this test can be used) for the duration of the COVID-19 declaration under Section 564(b)(1) of the Act, 21 U.S.C.section 360bbb-3(b)(1), unless the authorization is terminated  or revoked sooner.       Influenza A by PCR POSITIVE (A) NEGATIVE Final   Influenza B by PCR NEGATIVE NEGATIVE Final    Comment: (NOTE) The Xpert Xpress SARS-CoV-2/FLU/RSV plus assay is intended as an aid in the diagnosis of influenza from Nasopharyngeal swab specimens and should not be used as a sole basis for treatment. Nasal washings and aspirates are unacceptable for Xpert Xpress SARS-CoV-2/FLU/RSV testing.  Fact Sheet for Patients: bloggercourse.com  Fact Sheet for Healthcare Providers: seriousbroker.it  This test is not yet approved or cleared by the [place]  FDA and has been authorized for detection and/or diagnosis of SARS-CoV-2 by FDA under an Emergency Use Authorization (EUA). This EUA will remain in effect (meaning this test can be used) for the duration of the COVID-19 declaration under Section 564(b)(1) of the Act, 21 U.S.C. section 360bbb-3(b)(1), unless the authorization is terminated or revoked.     Resp Syncytial Virus by PCR NEGATIVE NEGATIVE Final    Comment: (NOTE) Fact Sheet for Patients: bloggercourse.com  Fact Sheet for Healthcare Providers: seriousbroker.it  This test is not yet approved or cleared by the [place]  FDA and has been authorized for detection and/or diagnosis of  SARS-CoV-2 by FDA under an Emergency Use Authorization (EUA). This EUA will remain in effect (meaning this test can be used) for the duration of the COVID-19 declaration under Section 564(b)(1) of the Act, 21 U.S.C. section 360bbb-3(b)(1), unless the authorization is terminated or revoked.  Performed at [facility], [address] [place], [place] [address]   Urine Culture     Status: None   Collection Time: 03/10/24 12:47 AM   Specimen: Urine, Catheterized  Result Value Ref Range Status   Specimen Description   Final    URINE, CATHETERIZED Performed at [facility], [address] [place], [place] [address]  Special Requests   Final    NONE Performed at [facility], [address] [place], [place] [address]    Culture   Final    NO GROWTH Performed at [facility], [address], [place], [place] [address]    Report Status 03/11/2024 FINAL  Final  Blood culture (routine x 2)     Status: None   Collection Time: 03/10/24  1:55 AM   Specimen: BLOOD  Result Value Ref Range Status   Specimen Description   Final    BLOOD RIGHT ANTECUBITAL Performed at [facility], [address], [place], [place] [address]    Special Requests   Final    BOTTLES DRAWN AEROBIC AND ANAEROBIC Blood Culture results may not be optimal due to an inadequate volume of blood received in culture bottles Performed at [facility], [address] [place], [place] [address]    Culture   Final    NO GROWTH 5 DAYS Performed at [facility], [address] [place], [place] [address]    Report Status 03/15/2024 FINAL  Final  MRSA Next Gen by PCR, Nasal     Status: None   Collection Time: 03/10/24  9:15 AM   Specimen: Nasal Mucosa; Nasal Swab  Result Value Ref Range Status   MRSA by PCR Next Gen NOT DETECTED NOT DETECTED Final    Comment: (NOTE) The GeneXpert MRSA Assay (FDA approved for  NASAL specimens only), is one component of a comprehensive MRSA colonization surveillance program. It is not intended to diagnose MRSA infection nor to guide or monitor treatment for MRSA infections. Test performance is not FDA approved in patients less than 60 years old. Performed at [facility], [address], [place], [place] [address]      Labs: BNP (last 3 results) No results for input(s): BNP in the last 8760 hours. Basic Metabolic Panel: Recent Labs  Lab 03/10/24 0513 03/11/24 0318 03/12/24 0328 03/13/24 0545 03/14/24 0543  NA 138 139 140 140 140  K 4.1 3.5 3.6 3.7 3.4*  CL 101 104 102 103 104  CO2 25 25 24 26 23   GLUCOSE 129* 103* 125* 118* 119*  BUN 14 16 24* 37* 32*  CREATININE 1.02* 1.06* 1.47* 2.22* 1.46*  CALCIUM 10.0 9.1 9.0 9.3 9.1  MG 1.9 1.9  --   --   --   PHOS 2.1* 3.7 5.6*  --   --    Liver Function Tests: Recent Labs  Lab 03/10/24 0028 03/12/24 0328  AST 25 30  ALT 21 21  ALKPHOS 135* 91  BILITOT 0.7 0.5  PROT 8.7* 7.0  ALBUMIN 4.5 3.6   No results for input(s): LIPASE, AMYLASE in the last 168 hours. Recent Labs  Lab 03/10/24 1440 03/12/24 0328  AMMONIA 65* 18   CBC: Recent Labs  Lab 03/10/24 0028 03/10/24 0513 03/11/24 0318 03/12/24 0328 03/13/24 0545  WBC 8.1 9.1 4.6 5.2 6.3  NEUTROABS 7.4 8.0*  --   --   --   HGB 15.0 13.9 12.2 13.6 13.0  HCT 45.2 42.9 37.4 40.8 39.1  MCV 88.8 91.9 89.0 87.7 89.1  PLT 248 214 188 208 216   Cardiac Enzymes: Recent Labs  Lab 03/10/24 0838  CKTOTAL 209   BNP: Invalid input(s): POCBNP CBG: No results for input(s): GLUCAP in the last 168 hours. D-Dimer No results for input(s): DDIMER in the last 72 hours. Hgb A1c No results for  input(s): HGBA1C in the last 72 hours. Lipid Profile No results for input(s): CHOL, HDL, LDLCALC, TRIG, CHOLHDL, LDLDIRECT in the last 72 hours. Thyroid  function studies No results for input(s): TSH, T4TOTAL,  T3FREE, THYROIDAB in the last 72 hours.  Invalid input(s): FREET3 Anemia work up No results for input(s): VITAMINB12, FOLATE, FERRITIN, TIBC, IRON, RETICCTPCT in the last 72 hours. Urinalysis    Component Value Date/Time   COLORURINE YELLOW 03/10/2024 0047   APPEARANCEUR CLEAR 03/10/2024 0047   LABSPEC 1.016 03/10/2024 0047   PHURINE 7.0 03/10/2024 0047   GLUCOSEU NEGATIVE 03/10/2024 0047   GLUCOSEU NEGATIVE 08/14/2023 1627   HGBUR NEGATIVE 03/10/2024 0047   BILIRUBINUR NEGATIVE 03/10/2024 0047   BILIRUBINUR Neg 02/18/2021 1455   KETONESUR NEGATIVE 03/10/2024 0047   PROTEINUR NEGATIVE 03/10/2024 0047   UROBILINOGEN 0.2 08/14/2023 1627   NITRITE NEGATIVE 03/10/2024 0047   LEUKOCYTESUR NEGATIVE 03/10/2024 0047   Sepsis Labs Recent Labs  Lab 03/10/24 0513 03/11/24 0318 03/12/24 0328 03/13/24 0545  WBC 9.1 4.6 5.2 6.3   Microbiology Recent Results (from the past 240 hours)  Blood culture (routine x 2)     Status: None   Collection Time: 03/10/24 12:25 AM   Specimen: BLOOD RIGHT HAND  Result Value Ref Range Status   Specimen Description   Final    BLOOD RIGHT HAND Performed at [facility], [address], [place], [place] [address]    Special Requests   Final    BOTTLES DRAWN AEROBIC AND ANAEROBIC Blood Culture results may not be optimal due to an inadequate volume of blood received in culture bottles Performed at [facility], [address] [place], [place] [address]    Culture   Final    NO GROWTH 5 DAYS Performed at [facility], [address] [place], [place] [address]    Report Status 03/15/2024 FINAL  Final  Resp panel by RT-PCR (RSV, Flu A&B, Covid) Anterior Nasal Swab     Status: Abnormal   Collection Time: 03/10/24 12:27 AM   Specimen: Anterior Nasal Swab  Result Value Ref Range Status   SARS Coronavirus 2 by RT PCR NEGATIVE NEGATIVE Final    Comment: (NOTE) SARS-CoV-2 target nucleic acids are NOT  DETECTED.  The SARS-CoV-2 RNA is generally detectable in upper respiratory specimens during the acute phase of infection. The lowest concentration of SARS-CoV-2 viral copies this assay can detect is 138 copies/mL. A negative result does not preclude SARS-Cov-2 infection and should not be used as the sole basis for treatment or other patient management decisions. A negative result may occur with  improper specimen collection/handling, submission of specimen other than nasopharyngeal swab, presence of viral mutation(s) within the areas targeted by this assay, and inadequate number of viral copies(<138 copies/mL). A negative result must be combined with clinical observations, patient history, and epidemiological information. The expected result is Negative.  Fact Sheet for Patients:  bloggercourse.com  Fact Sheet for Healthcare Providers:  seriousbroker.it  This test is no t yet approved or cleared by the [place]  FDA and  has been authorized for detection and/or diagnosis of SARS-CoV-2 by FDA under an Emergency Use Authorization (EUA). This EUA will remain  in effect (meaning this test can be used) for the duration of the COVID-19 declaration under Section 564(b)(1) of the Act, 21 U.S.C.section 360bbb-3(b)(1), unless the authorization is terminated  or revoked sooner.       Influenza A by PCR POSITIVE (A) NEGATIVE Final   Influenza  B by PCR NEGATIVE NEGATIVE Final    Comment: (NOTE) The Xpert Xpress SARS-CoV-2/FLU/RSV plus assay is intended as an aid in the diagnosis of influenza from Nasopharyngeal swab specimens and should not be used as a sole basis for treatment. Nasal washings and aspirates are unacceptable for Xpert Xpress SARS-CoV-2/FLU/RSV testing.  Fact Sheet for Patients: bloggercourse.com  Fact Sheet for Healthcare Providers: seriousbroker.it  This test is not  yet approved or cleared by the [place]  FDA and has been authorized for detection and/or diagnosis of SARS-CoV-2 by FDA under an Emergency Use Authorization (EUA). This EUA will remain in effect (meaning this test can be used) for the duration of the COVID-19 declaration under Section 564(b)(1) of the Act, 21 U.S.C. section 360bbb-3(b)(1), unless the authorization is terminated or revoked.     Resp Syncytial Virus by PCR NEGATIVE NEGATIVE Final    Comment: (NOTE) Fact Sheet for Patients: bloggercourse.com  Fact Sheet for Healthcare Providers: seriousbroker.it  This test is not yet approved or cleared by the [place]  FDA and has been authorized for detection and/or diagnosis of SARS-CoV-2 by FDA under an Emergency Use Authorization (EUA). This EUA will remain in effect (meaning this test can be used) for the duration of the COVID-19 declaration under Section 564(b)(1) of the Act, 21 U.S.C. section 360bbb-3(b)(1), unless the authorization is terminated or revoked.  Performed at [facility], [address] [place], [place] [address]   Urine Culture     Status: None   Collection Time: 03/10/24 12:47 AM   Specimen: Urine, Catheterized  Result Value Ref Range Status   Specimen Description   Final    URINE, CATHETERIZED Performed at [facility], [address] [place], [place] [address]    Special Requests   Final    NONE Performed at [facility], [address], [place], [place] [address]    Culture   Final    NO GROWTH Performed at [facility], [address], [place], [place] [address]    Report Status 03/11/2024 FINAL  Final  Blood culture (routine x 2)     Status: None   Collection Time: 03/10/24  1:55 AM   Specimen: BLOOD  Result Value Ref Range Status   Specimen Description   Final    BLOOD RIGHT ANTECUBITAL Performed at [facility], [address] [place], [place] [address]    Special Requests   Final    BOTTLES DRAWN AEROBIC AND ANAEROBIC Blood Culture results may not be optimal due to an inadequate volume of blood received in culture bottles Performed at [facility], [address] [place], [place] [address]    Culture   Final    NO GROWTH 5 DAYS Performed at [facility], [address] [place], [place] [address]    Report Status 03/15/2024 FINAL  Final  MRSA Next Gen by PCR, Nasal     Status: None   Collection Time: 03/10/24  9:15 AM   Specimen: Nasal Mucosa; Nasal Swab  Result Value Ref Range Status   MRSA by PCR Next Gen NOT DETECTED NOT DETECTED Final    Comment: (NOTE) The GeneXpert MRSA Assay (FDA approved for NASAL specimens only), is one component of a comprehensive MRSA colonization surveillance program. It is not intended to diagnose MRSA infection nor to guide or monitor treatment for MRSA infections. Test performance is not FDA approved in patients less than 44 years old. Performed  at [facility], [address] [address] [place], [place] [address]      Time coordinating discharge: Over 30 minutes  SIGNED:   Camellia PARAS Erhardt Dada, DO  [facility] 03/15/2024, 11:14 AM     [1]  Allergies Allergen Reactions   Doxycycline  Nausea Only   Codeine Nausea And Vomiting   Pollen Extract Itching and Other (See Comments)    Congestion, itchy eyes, runny nose

## 2024-03-16 ENCOUNTER — Other Ambulatory Visit (HOSPITAL_COMMUNITY): Payer: Self-pay

## 2024-03-17 ENCOUNTER — Telehealth: Payer: Self-pay | Admitting: *Deleted

## 2024-03-17 NOTE — Transitions of Care (Post Inpatient/ED Visit) (Signed)
° °  03/17/2024  Name: Kari Coleman MRN: [id number] DOB: 11/12/1950  Today's TOC FU Call Status: Today's TOC FU Call Status:: Unsuccessful Call (1st Attempt) Unsuccessful Call (1st Attempt) Date: 03/17/24  Attempted to reach the patient regarding the most recent Inpatient/ED visit.  Follow Up Plan: Additional outreach attempts will be made to reach the patient to complete the Transitions of Care (Post Inpatient/ED visit) call.   Mliss Creed [facility], BSN RN Care Manager/ Transition of Care [place]/ [facility] [phone]

## 2024-03-18 ENCOUNTER — Telehealth: Payer: Self-pay

## 2024-03-18 ENCOUNTER — Telehealth: Payer: Self-pay | Admitting: *Deleted

## 2024-03-18 NOTE — Telephone Encounter (Signed)
 Copied from CRM [id number]. Topic: Clinical - Home Health Verbal Orders >> Mar 18, 2024  1:01 PM Alfonso ORN wrote: Caller/Agency: [facility]  Callback Number: [phone] Service Requested: Occupational Therapy and Physical Therapy Frequency: starting next Monday  Any new concerns about the patient? No

## 2024-03-18 NOTE — Telephone Encounter (Signed)
 Called [facility] and spoke to Chava, verbal orders given for Service Requested: Occupational Therapy and Physical Therapy to start on Monday okay per [place]. Chava verbalized understanding.

## 2024-03-18 NOTE — Transitions of Care (Post Inpatient/ED Visit) (Signed)
° °  03/18/2024  Name: NAJLA AUGHENBAUGH MRN: [id number] DOB: 1950-10-28  Today's TOC FU Call Status: Today's TOC FU Call Status:: Unsuccessful Call (2nd Attempt) Unsuccessful Call (2nd Attempt) Date: 03/18/24  Attempted to reach the patient regarding the most recent Inpatient/ED visit.  Follow Up Plan: Additional outreach attempts will be made to reach the patient to complete the Transitions of Care (Post Inpatient/ED visit) call.   Arvin Seip RN, BSN, CCM [employer], Population Health Case Manager Phone: [phone]

## 2024-03-19 ENCOUNTER — Telehealth: Payer: Self-pay | Admitting: *Deleted

## 2024-03-19 NOTE — Transitions of Care (Post Inpatient/ED Visit) (Signed)
 03/19/2024  Name: Kari Coleman MRN: [id number] DOB: 05-Feb-1951  Today's TOC FU Call Status: Today's TOC FU Call Status:: Successful TOC FU Call Completed TOC FU Call Complete Date: 03/19/24  Patient's Name and Date of Birth confirmed. Name, DOB  Transition Care Management Follow-up Telephone Call Date of Discharge: 03/15/24 Discharge Facility: [facility]) Type of Discharge: Inpatient Admission Primary Inpatient Discharge Diagnosis:: Acute metabolic encephalopathy How have you been since you were released from the hospital?: Better (eating, drinking well, no issues with bowels/ bladder, ambulating without difficulty) Any questions or concerns?: No  Items Reviewed: Did you receive and understand the discharge instructions provided?: Yes Medications obtained,verified, and reconciled?: Yes (Medications Reviewed) Any new allergies since your discharge?: No Dietary orders reviewed?: Yes Type of Diet Ordered:: heart healthy Do you have support at home?: Yes People in Home [RPT]: grandchild(ren) Name of Support/Comfort Primary Source: adult grandson living with pt,  2 daughters also provide assistance Reviewed signs/ symptoms of infection, respiratory illness Reviewed safety precautions Reviewed pulmonary hygeine  Medications Reviewed Today: Medications Reviewed Today     Reviewed by Aura Mliss LABOR, RN (Registered Nurse) on 03/19/24 at 1417  Med List Status: <None>   Medication Order Taking? Sig Documenting Provider Last Dose Status Informant  acetaminophen  (TYLENOL ) 325 MG tablet [id number] Yes Take 650 mg by mouth every 4 (four) hours as needed for mild pain or moderate pain. [provider]  Active Child, Pharmacy Records  albuterol  (PROVENTIL  HFA) 108 [phone] Base) MCG/ACT inhaler [id number] Yes Inhale 2 puffs into the lungs every 6 (six) hours as needed for wheezing or shortness of breath. Job Lukes, [name]  Active Child, Pharmacy Records  amLODipine  (NORVASC ) 10  MG tablet [id number] Yes Take 1 tablet (10 mg total) by mouth daily. [place], Camellia PARAS, DO  Active   brimonidine  (ALPHAGAN ) 0.2 % ophthalmic solution [id number] Yes Place 1 drop into the right eye at bedtime. [provider]  Active Child, Pharmacy Records           Med Note JACKOLYN, WADDELL DEL   Wed Jul 25, 2023  9:23 AM)    Cholecalciferol  (VITAMIN D -3) 25 MCG (1000 UT) CAPS [id number] Yes Take 2,000 Units by mouth daily. [provider]  Active Child, Pharmacy Records  hydrALAZINE  (APRESOLINE ) 25 MG tablet [id number] Yes Take 1 tablet (25 mg total) by mouth every 8 (eight) hours. [place], Camellia PARAS, DO  Active   latanoprost  (XALATAN ) 0.005 % ophthalmic solution [id number] Yes Place 1 drop into both eyes at bedtime. [provider]  Active Child, Pharmacy Records           Med Note JACKOLYN, WADDELL DEL   Wed Jul 25, 2023  9:24 AM)    levothyroxine  (SYNTHROID ) 88 MCG tablet [id number] Yes Take 1 tablet (88 mcg total) by mouth daily before breakfast. Trixie File, MD  Active Child, Pharmacy Records  metoprolol  tartrate (LOPRESSOR ) 25 MG tablet [id number] Yes Take 1 tablet (25 mg total) by mouth 2 (two) times daily. Job Lukes, [name]  Active Child, Pharmacy Records  ondansetron  (ZOFRAN ) 4 MG tablet [id number]  Take 1 tablet (4 mg total) by mouth every 6 (six) hours as needed for nausea or vomiting.  Patient not taking: Reported on 03/19/2024   [place], Camellia PARAS, DO  Active   pantoprazole  (PROTONIX ) 40 MG tablet [id number] Yes Take 1 tablet (40 mg total) by mouth daily. Job Lukes, [name]  Active Child, Pharmacy Records  polyethylene glycol (MIRALAX  / GLYCOLAX ) 17 g packet [id number]  Take 17 g by mouth daily.  Patient not taking: Reported on 03/19/2024   Verdene Purchase, MD  Active Child, Pharmacy Records  pravastatin  (PRAVACHOL ) 10 MG tablet [id number] Yes TAKE 1 TABLET BY MOUTH DAILY Job Lukes, PA  Active Child, Pharmacy Records            Home Care and  Equipment/Supplies: Were Home Health Services Ordered?:  (pt states  pretty sure they called me but I didn't answer  pt has contact # for [facility], will call them today to get home visit scheduled) Name of Home Health Agency:: [facility] Has Agency set up a time to come to your home?: No EMR reviewed for Home Health Orders: Orders present/patient has not received call (refer to CM for follow-up) Any new equipment or medical supplies ordered?: No  Functional Questionnaire: Do you need assistance with bathing/showering or dressing?: No Do you need assistance with meal preparation?: No Do you need assistance with eating?: No Do you have difficulty maintaining continence: No Do you need assistance with getting out of bed/getting out of a chair/moving?: Yes (has cane to use as needed) Do you have difficulty managing or taking your medications?: No  Follow up appointments reviewed: PCP Follow-up appointment confirmed?: No (pt states both daughters work and she will need to schedule post hospital follow up to accomodate daughter's schedule, pt will work with her daughters to get appointment scheduled.) MD Provider Line [phone] Given: No Specialist Hospital Follow-up appointment confirmed?: NA Do you need transportation to your follow-up appointment?: No Do you understand care options if your condition(s) worsen?: Yes-patient verbalized understanding  SDOH Interventions Today    Flowsheet Row Most Recent Value  SDOH Interventions   Food Insecurity Interventions Intervention Not Indicated  Housing Interventions Intervention Not Indicated  Transportation Interventions Intervention Not Indicated  Utilities Interventions Intervention Not Indicated    Mliss Creed [facility], BSN RN Care Manager/ Transition of Care [place]/ [facility] Population Health [phone]

## 2024-03-21 ENCOUNTER — Telehealth: Payer: Self-pay | Admitting: *Deleted

## 2024-03-21 ENCOUNTER — Other Ambulatory Visit: Payer: Self-pay | Admitting: Physician Assistant

## 2024-03-21 MED ORDER — METOPROLOL TARTRATE 25 MG PO TABS: 25.0000 mg | ORAL_TABLET | Freq: Two times a day (BID) | ORAL | 0 refills | Status: AC

## 2024-03-21 NOTE — Telephone Encounter (Signed)
 Copied from CRM #[id number]. Topic: Clinical - Medication Question >> Mar 21, 2024  2:40 PM Delon DASEN wrote: Reason for CRM: metoprolol  tartrate (LOPRESSOR ) 25 MG tablet- requested refill but pharmacy said contact office

## 2024-03-21 NOTE — Telephone Encounter (Signed)
 Pt notified. Rx sent to the pharmacy.

## 2024-03-24 ENCOUNTER — Telehealth: Payer: Self-pay

## 2024-03-24 NOTE — Telephone Encounter (Signed)
 Returned [facility] and gave verbal ok for [facility] orders.

## 2024-03-24 NOTE — Telephone Encounter (Signed)
 Copied from CRM #[id number]. Topic: Clinical - Home Health Verbal Orders >> Mar 24, 2024  3:24 PM Anairis L wrote: Caller/Agency: [facility]ette G[facility] Callback Number: [phone] Secure VM Service Requested: Physical Therapy Frequency: 1 week 2 2 week 1 1 week 1 Any new concerns about the patient? No  Please see call from [facility] and advise on verbal orders

## 2024-03-26 ENCOUNTER — Ambulatory Visit: Admitting: Physician Assistant

## 2024-03-26 ENCOUNTER — Encounter: Payer: Self-pay | Admitting: Physician Assistant

## 2024-03-26 VITALS — BP 140/82 | HR 77 | Temp 99.7°F | Resp 16 | Ht 67.0 in | Wt 214.8 lb

## 2024-03-26 DIAGNOSIS — L292 Pruritus vulvae: Secondary | ICD-10-CM

## 2024-03-26 DIAGNOSIS — I10 Essential (primary) hypertension: Secondary | ICD-10-CM | POA: Diagnosis not present

## 2024-03-26 DIAGNOSIS — Z8619 Personal history of other infectious and parasitic diseases: Secondary | ICD-10-CM

## 2024-03-26 LAB — COMPREHENSIVE METABOLIC PANEL WITH GFR
AG Ratio: 1.2 (calc) (ref 1.0–2.5)
ALT: 20 U/L (ref 6–29)
AST: 17 U/L (ref 10–35)
Albumin: 4.3 g/dL (ref 3.6–5.1)
Alkaline phosphatase (APISO): 100 U/L (ref 37–153)
BUN/Creatinine Ratio: 14 (calc) (ref 6–22)
BUN: 16 mg/dL (ref 7–25)
CO2: 25 mmol/L (ref 20–32)
Calcium: 10 mg/dL (ref 8.6–10.4)
Chloride: 104 mmol/L (ref 98–110)
Creat: 1.18 mg/dL — ABNORMAL HIGH (ref 0.60–1.00)
Globulin: 3.5 g/dL (ref 1.9–3.7)
Glucose, Bld: 105 mg/dL — ABNORMAL HIGH (ref 65–99)
Potassium: 3.8 mmol/L (ref 3.5–5.3)
Sodium: 139 mmol/L (ref 135–146)
Total Bilirubin: 0.7 mg/dL (ref 0.2–1.2)
Total Protein: 7.8 g/dL (ref 6.1–8.1)
eGFR: 48 mL/min/1.73m2 — ABNORMAL LOW

## 2024-03-26 LAB — CBC WITH DIFFERENTIAL/PLATELET
Absolute Lymphocytes: 2099 {cells}/uL (ref 850–3900)
Absolute Monocytes: 330 {cells}/uL (ref 200–950)
Basophils Absolute: 92 {cells}/uL (ref 0–200)
Basophils Relative: 1.4 %
Eosinophils Absolute: 218 {cells}/uL (ref 15–500)
Eosinophils Relative: 3.3 %
HCT: 40.8 % (ref 35.9–46.0)
Hemoglobin: 13 g/dL (ref 11.7–15.5)
MCH: 28.7 pg (ref 27.0–33.0)
MCHC: 31.9 g/dL (ref 31.6–35.4)
MCV: 90.1 fL (ref 81.4–101.7)
MPV: 10.6 fL (ref 7.5–12.5)
Monocytes Relative: 5 %
Neutro Abs: 3861 {cells}/uL (ref 1500–7800)
Neutrophils Relative %: 58.5 %
Platelets: 405 Thousand/uL — ABNORMAL HIGH (ref 140–400)
RBC: 4.53 Million/uL (ref 3.80–5.10)
RDW: 12.7 % (ref 11.0–15.0)
Total Lymphocyte: 31.8 %
WBC: 6.6 Thousand/uL (ref 3.8–10.8)

## 2024-03-26 MED ORDER — FLUCONAZOLE 150 MG PO TABS: ORAL_TABLET | ORAL | 0 refills | Status: AC

## 2024-03-26 MED ORDER — ALBUTEROL SULFATE (2.5 MG/3ML) 0.083% IN NEBU: 2.5000 mg | INHALATION_SOLUTION | Freq: Four times a day (QID) | RESPIRATORY_TRACT | 1 refills | Status: AC | PRN

## 2024-03-26 NOTE — Progress Notes (Signed)
 "  History of Present Illness:   Chief Complaint  Patient presents with   Hospitalization Follow-up    Admitted for about a week. Discharged on December 13th, 2025    Discussed the use of AI scribe software for clinical note transcription with the patient, who gave verbal consent to proceed.  History of Present Illness   Kari Coleman is a 73 year old female with hypertension and recent sepsis who presents for follow-up after hospitalization. She is accompanied by a family member who assists with her care.  She was hospitalized from December 8 to 13 for sepsis from influenza, required oxygen, and was treated with Tamiflu . Her kidney function worsened during that stay, and lisinopril  was stopped. She now has home health visits.  Her current medications include amlodipine  10 mg daily, metoprolol  25 mg twice daily, and hydralazine  25 mg three times a day. She is often missing the midday hydralazine  dose because her caregiver is at work.  She has longstanding thyroid  disease and takes her thyroid  medication every morning. She is currently taking levothyroxine  88 mcg daily.  She has persistent vaginal itching that began before hospitalization and has not improved with over-the-counter yeast treatment or a topical medication from gynecology. She denies vaginal discharge.    Past Medical History:  Diagnosis Date   Allergy    Arthritis    Cataract    Cervical cancer (HCC)    GERD (gastroesophageal reflux disease)    Glaucoma 2017   History of echocardiogram    Echo 9/16:  Mild LVH, EF 55-60%, no RWMA, Gr 1 DD, mild MR // Echo 1/17:  Mild LVH, EF 50-55%, Gr 1 DD, mild to mod MR, mild LAE   Hypertension    Hyperthyroidism    s/p RAI treatment   Palpitations    PONV (postoperative nausea and vomiting)    years ago  . Woke up and saw asian people    PONV (postoperative nausea and vomiting) 07/18/2021   Retinal detachment    L eye - partial blindness   Tobacco abuse    Tuberculosis     positive test as a caregiver cxr annually    Urinary incontinence      Social History[1]  Past Surgical History:  Procedure Laterality Date   ABDOMINAL HYSTERECTOMY  1984   bladder surgey for incontinence     EYE SURGERY     REPAIR OF COMPLEX TRACTION RETINAL DETACHMENT     thyroid  radiation     TOTAL ABDOMINAL HYSTERECTOMY W/ BILATERAL SALPINGOOPHORECTOMY     TOTAL KNEE ARTHROPLASTY Right 04/22/2019   Procedure: RIGHT TOTAL KNEE ARTHROPLASTY;  Surgeon: Sheril Coy, MD;  Location: [facility];  Service: Orthopedics;  Laterality: Right;  PENDING PRE-OP FOR SDDC APPROVAL    Family History  Problem Relation Age of Onset   Hypertension Mother    Heart failure Mother    Diabetes Mother    Pancreatic cancer Father    Colon cancer Maternal Aunt    Cancer Sister    Thyroid  disease Paternal Aunt    Cancer Paternal Aunt    Thyroid  disease Other        paternal side   Cancer Maternal Grandfather    Diabetes Paternal Grandmother    Diabetes Maternal Uncle    Colon polyps Neg Hx    Stomach cancer Neg Hx     Allergies[2]  Current Medications:  Current Medications[3]   Review of Systems:   Negative unless otherwise specified per HPI.  Vitals:  Vitals:   03/26/24 1026  BP: (!) 154/82  Pulse: 77  Resp: 16  Temp: 99.7 F (37.6 C)  TempSrc: Temporal  SpO2: 98%  Weight: 214 lb 12.8 oz (97.4 kg)  Height: 5' 7 (1.702 m)     Body mass index is 33.64 kg/m.  Physical Exam:   Physical Exam Vitals and nursing note reviewed.  Constitutional:      General: She is not in acute distress.    Appearance: She is well-developed. She is not ill-appearing or toxic-appearing.  Cardiovascular:     Rate and Rhythm: Normal rate and regular rhythm.     Pulses: Normal pulses.     Heart sounds: Normal heart sounds, S1 normal and S2 normal.  Pulmonary:     Effort: Pulmonary effort is normal.     Breath sounds: Normal breath sounds.  Skin:    General: Skin is warm and dry.   Neurological:     Mental Status: She is alert.     GCS: GCS eye subscore is 4. GCS verbal subscore is 5. GCS motor subscore is 6.  Psychiatric:        Speech: Speech normal.        Behavior: Behavior normal. Behavior is cooperative.     Assessment and Plan:   Assessment and Plan    Essential hypertension Hypertension remains uncontrolled. Compliance with medication is uncertain. Previous cardiology evaluation suggested diuretics, but she is hesitant due to urinary issues. Recent thyroid  function tests normal. Current medications: amlodipine , metoprolol , hydralazine . Lisinopril  discontinued due to kidney concerns. - Rechecked labs to assess kidney function. - If kidney function improved, discontinue hydralazine  and initiate hydrochlorothiazide . - Ensure compliance with current antihypertensive regimen. - Monitor blood pressure regularly and maintain a log.  History of sepsis Recent hospitalization for sepsis secondary to influenza. Discharged on December 13th with Home Health services initiated. We will administer flu shot next month Recheck blood work today  Vulvovaginal itching  Persistent vulvovaginal itching with no discharge. Previous topical treatments ineffective. Possible candidiasis considered. Over-the-counter yeast medication without relief. Declined pelvic today. - Prescribed Diflucan  (fluconazole ) for possible candidiasis. - Advised use of Eucerin cream as a barrier to prevent irritation. - Instructed to avoid scented soaps and powders. - If symptoms persist, consider referral to gynecology or dermatology.      Lucie Buttner, PA-C    [1]  Social History Tobacco Use   Smoking status: Former    Current packs/day: 0.00    Average packs/day: 0.5 packs/day for 48.0 years (24.0 ttl pk-yrs)    Types: Cigarettes    Start date: 11/30/1968    Quit date: 10/31/2016    Years since quitting: 7.4   Smokeless tobacco: Never   Tobacco comments:    did not smoke more  than .5 pack   Vaping Use   Vaping status: Never Used  Substance Use Topics   Alcohol use: Not Currently    Alcohol/week: 0.0 standard drinks of alcohol    Comment: rarely once a year   Drug use: No  [2]  Allergies Allergen Reactions   Doxycycline  Nausea Only   Codeine Nausea And Vomiting   Pollen Extract Itching and Other (See Comments)    Congestion, itchy eyes, runny nose  [3]  Current Outpatient Medications:    acetaminophen  (TYLENOL ) 325 MG tablet, Take 650 mg by mouth every 4 (four) hours as needed for mild pain or moderate pain., Disp: , Rfl:    albuterol  (PROVENTIL  HFA) 108 (90 Base) MCG/ACT inhaler, Inhale  2 puffs into the lungs every 6 (six) hours as needed for wheezing or shortness of breath., Disp: 18 g, Rfl: 2   albuterol  (PROVENTIL ) (2.5 MG/3ML) 0.083% nebulizer solution, Take 3 mLs (2.5 mg total) by nebulization every 6 (six) hours as needed for wheezing or shortness of breath., Disp: 150 mL, Rfl: 1   amLODipine  (NORVASC ) 10 MG tablet, Take 1 tablet (10 mg total) by mouth daily., Disp: 90 tablet, Rfl: 1   brimonidine  (ALPHAGAN ) 0.2 % ophthalmic solution, Place 1 drop into the right eye at bedtime., Disp: , Rfl:    Cholecalciferol  (VITAMIN D -3) 25 MCG (1000 UT) CAPS, Take 2,000 Units by mouth daily., Disp: , Rfl:    fluconazole  (DIFLUCAN ) 150 MG tablet, Take 1 tablet by mouth. If symptoms persist, may take an additional tablet in 3-5 days. May repeat for a total of 3 tablets., Disp: 3 tablet, Rfl: 0   hydrALAZINE  (APRESOLINE ) 25 MG tablet, Take 1 tablet (25 mg total) by mouth every 8 (eight) hours., Disp: 270 tablet, Rfl: 0   latanoprost  (XALATAN ) 0.005 % ophthalmic solution, Place 1 drop into both eyes at bedtime., Disp: , Rfl:    levothyroxine  (SYNTHROID ) 88 MCG tablet, Take 1 tablet (88 mcg total) by mouth daily before breakfast., Disp: 45 tablet, Rfl: 8   metoprolol  tartrate (LOPRESSOR ) 25 MG tablet, Take 1 tablet (25 mg total) by mouth 2 (two) times daily., Disp: 180  tablet, Rfl: 0   ondansetron  (ZOFRAN ) 4 MG tablet, Take 1 tablet (4 mg total) by mouth every 6 (six) hours as needed for nausea or vomiting., Disp: , Rfl:    pantoprazole  (PROTONIX ) 40 MG tablet, Take 1 tablet (40 mg total) by mouth daily., Disp: 90 tablet, Rfl: 1   polyethylene glycol (MIRALAX  / GLYCOLAX ) 17 g packet, Take 17 g by mouth daily., Disp: 14 each, Rfl: 0   pravastatin  (PRAVACHOL ) 10 MG tablet, TAKE 1 TABLET BY MOUTH DAILY, Disp: 100 tablet, Rfl: 2  "

## 2024-03-26 NOTE — Patient Instructions (Signed)
" °  VISIT SUMMARY: You had a follow-up visit after your recent hospitalization for sepsis. We discussed your blood pressure, ongoing vaginal itching, and vision issues.  YOUR PLAN: UNCONTROLLED HYPERTENSION: Your blood pressure remains high, and there are concerns about whether you are taking your medications as prescribed. -We rechecked your kidney function with lab tests. -If your kidney function has improved, we will stop hydralazine  and start you on hydrochlorothiazide . -Please make sure to take your blood pressure medications as prescribed. -Monitor your blood pressure regularly and keep a log of your readings.  SEPSIS RECOVERY: You were recently hospitalized for sepsis caused by the flu and are now receiving home health services. -Continue with your home health visits as scheduled.  VULVOVAGINAL ITCHING: You have ongoing vaginal itching that has not improved with previous treatments. -We prescribed Diflucan  (fluconazole ) to treat a possible yeast infection. -Use Eucerin cream to prevent irritation. -Avoid using scented soaps and powders. -If the itching continues, we may refer you to a gynecologist or dermatologist.  VISUAL IMPAIRMENT: You have cloudy vision in one eye, but recent evaluations show improvement. -Continue to follow up with your eye doctor as needed.                      Contains text generated by Abridge.                                 Contains text generated by Abridge.   "

## 2024-03-28 ENCOUNTER — Ambulatory Visit: Payer: Self-pay | Admitting: Physician Assistant

## 2024-03-28 ENCOUNTER — Telehealth: Payer: Self-pay | Admitting: *Deleted

## 2024-03-28 ENCOUNTER — Other Ambulatory Visit: Payer: Self-pay | Admitting: Physician Assistant

## 2024-03-28 MED ORDER — HYDROCHLOROTHIAZIDE 12.5 MG PO TABS: 12.5000 mg | ORAL_TABLET | Freq: Every day | ORAL | 1 refills | Status: AC

## 2024-03-28 NOTE — Telephone Encounter (Signed)
 Copied from CRM #[id number]. Topic: Clinical - Home Health Verbal Orders >> Mar 26, 2024 11:49 AM Viola FALCON wrote: Caller/Agency: Richard from [facility]  Callback Number: [phone] secure  Service Requested: Occupational Therapy Frequency: 1 a week for 6 weeks starting next week  Any new concerns about the patient? Yes, blood pressure was a little elevated and patient took her medication so it came down 168/66 then 156/68    See note  Autymn Omlor,RMA

## 2024-03-28 NOTE — Progress Notes (Signed)
 Blood work is abnormal but shows improvement in kidney function.  Lets stop the hydralazine  blood pressure medication and start hydrochlorothiazide  to help reduce pill burden.  Please follow up in 1 MONTH to review blood pressure and recheck labs.

## 2024-04-01 NOTE — Telephone Encounter (Signed)
 Spoke to  [employer] from [facility], verbal orders given for Service Requested: Occupational Therapy Frequency: 1 a week for 6 weeks starting next week., told him okay per [facility] for pt. In regards to pt's blood pressure please have her to continue to monitor. Pt has an appt on 1/26. Richard verbalized understanding.

## 2024-04-11 ENCOUNTER — Other Ambulatory Visit: Payer: Self-pay | Admitting: Physician Assistant

## 2024-04-11 NOTE — Telephone Encounter (Signed)
 Copied from CRM #[id number]. Topic: Clinical - Medication Refill >> Apr 11, 2024  4:41 PM Wess RAMAN wrote: Medication: albuterol  (PROVENTIL  HFA) 108 (90 Base) MCG/ACT inhaler   Has the patient contacted their pharmacy? Yes (Agent: If no, request that the patient contact the pharmacy for the refill. If patient does not wish to contact the pharmacy document the reason why and proceed with request.) (Agent: If yes, when and what did the pharmacy advise?) They stated patient would receive it faster if they contacted doctor  This is the patient's preferred pharmacy:  [facility] [address], [place] - [address] [place] [address] Phone: [phone] Fax: [phone]  Is this the correct pharmacy for this prescription? Yes If no, delete pharmacy and type the correct one.   Has the prescription been filled recently? Yes  Is the patient out of the medication? Yes  Has the patient been seen for an appointment in the last year OR does the patient have an upcoming appointment? Yes  Can we respond through MyChart? No  Agent: Please be advised that Rx refills may take up to 3 business days. We ask that you follow-up with your pharmacy.

## 2024-04-14 MED ORDER — ALBUTEROL SULFATE HFA 108 (90 BASE) MCG/ACT IN AERS: 2.0000 | INHALATION_SPRAY | Freq: Four times a day (QID) | RESPIRATORY_TRACT | 2 refills | Status: AC | PRN

## 2024-04-24 ENCOUNTER — Telehealth: Payer: Self-pay | Admitting: Physician Assistant

## 2024-04-24 NOTE — Telephone Encounter (Signed)
 Lvm to sche sooner due to the inclement weather incoming. aw

## 2024-04-28 ENCOUNTER — Ambulatory Visit: Admitting: Physician Assistant

## 2024-05-01 ENCOUNTER — Ambulatory Visit: Admitting: Physician Assistant

## 2024-05-05 ENCOUNTER — Ambulatory Visit: Admitting: Physician Assistant

## 2024-05-14 ENCOUNTER — Ambulatory Visit: Admitting: Physician Assistant

## 2024-05-22 ENCOUNTER — Ambulatory Visit: Admitting: Internal Medicine

## 2024-12-24 ENCOUNTER — Ambulatory Visit
